# Patient Record
Sex: Male | Born: 1945 | Race: White | Hispanic: No | Marital: Married | Smoking: Never smoker
Health system: Southern US, Community
[De-identification: ages and names within clinical notes are randomized; demographics above are authoritative.]

## PROBLEM LIST (undated history)

## (undated) ENCOUNTER — Emergency Department (HOSPITAL_COMMUNITY): Payer: Medicare Other

## (undated) DIAGNOSIS — K219 Gastro-esophageal reflux disease without esophagitis: Secondary | ICD-10-CM

## (undated) DIAGNOSIS — Z8719 Personal history of other diseases of the digestive system: Secondary | ICD-10-CM

## (undated) DIAGNOSIS — C189 Malignant neoplasm of colon, unspecified: Secondary | ICD-10-CM

## (undated) DIAGNOSIS — Z803 Family history of malignant neoplasm of breast: Secondary | ICD-10-CM

## (undated) DIAGNOSIS — Z800 Family history of malignant neoplasm of digestive organs: Secondary | ICD-10-CM

## (undated) DIAGNOSIS — C801 Malignant (primary) neoplasm, unspecified: Secondary | ICD-10-CM

## (undated) DIAGNOSIS — I4891 Unspecified atrial fibrillation: Secondary | ICD-10-CM

## (undated) DIAGNOSIS — I10 Essential (primary) hypertension: Secondary | ICD-10-CM

## (undated) DIAGNOSIS — G20 Parkinson's disease: Secondary | ICD-10-CM

## (undated) DIAGNOSIS — Z8739 Personal history of other diseases of the musculoskeletal system and connective tissue: Secondary | ICD-10-CM

## (undated) DIAGNOSIS — G20A1 Parkinson's disease without dyskinesia, without mention of fluctuations: Secondary | ICD-10-CM

## (undated) DIAGNOSIS — I8390 Asymptomatic varicose veins of unspecified lower extremity: Secondary | ICD-10-CM

## (undated) DIAGNOSIS — G4730 Sleep apnea, unspecified: Secondary | ICD-10-CM

## (undated) HISTORY — DX: Family history of malignant neoplasm of breast: Z80.3

## (undated) HISTORY — DX: Family history of malignant neoplasm of digestive organs: Z80.0

## (undated) HISTORY — PX: VEIN LIGATION AND STRIPPING: SHX2653

## (undated) HISTORY — DX: Essential (primary) hypertension: I10

## (undated) HISTORY — DX: Sleep apnea, unspecified: G47.30

## (undated) HISTORY — DX: Asymptomatic varicose veins of unspecified lower extremity: I83.90

## (undated) HISTORY — DX: Malignant neoplasm of colon, unspecified: C18.9

---

## 2004-05-21 ENCOUNTER — Ambulatory Visit (HOSPITAL_COMMUNITY): Admission: RE | Admit: 2004-05-21 | Discharge: 2004-05-21 | Payer: Self-pay | Admitting: Pulmonary Disease

## 2005-10-01 ENCOUNTER — Ambulatory Visit (HOSPITAL_COMMUNITY): Admission: RE | Admit: 2005-10-01 | Discharge: 2005-10-01 | Payer: Self-pay | Admitting: Pulmonary Disease

## 2006-06-02 ENCOUNTER — Ambulatory Visit (HOSPITAL_COMMUNITY): Admission: RE | Admit: 2006-06-02 | Discharge: 2006-06-02 | Payer: Self-pay | Admitting: Pulmonary Disease

## 2011-05-18 ENCOUNTER — Emergency Department (HOSPITAL_COMMUNITY)
Admission: EM | Admit: 2011-05-18 | Discharge: 2011-05-18 | Disposition: A | Discharge status: Home / Self Care | Payer: PRIVATE HEALTH INSURANCE | Attending: Emergency Medicine | Admitting: Emergency Medicine

## 2011-05-18 ENCOUNTER — Emergency Department (HOSPITAL_COMMUNITY): Payer: PRIVATE HEALTH INSURANCE

## 2011-05-18 DIAGNOSIS — S8000XA Contusion of unspecified knee, initial encounter: Secondary | ICD-10-CM | POA: Insufficient documentation

## 2011-05-18 DIAGNOSIS — Y92009 Unspecified place in unspecified non-institutional (private) residence as the place of occurrence of the external cause: Secondary | ICD-10-CM | POA: Insufficient documentation

## 2011-05-18 DIAGNOSIS — I10 Essential (primary) hypertension: Secondary | ICD-10-CM | POA: Insufficient documentation

## 2011-05-18 DIAGNOSIS — W108XXA Fall (on) (from) other stairs and steps, initial encounter: Secondary | ICD-10-CM | POA: Insufficient documentation

## 2011-05-18 DIAGNOSIS — S5010XA Contusion of unspecified forearm, initial encounter: Secondary | ICD-10-CM | POA: Insufficient documentation

## 2013-05-22 ENCOUNTER — Emergency Department (HOSPITAL_COMMUNITY): Payer: Medicare Other

## 2013-05-22 ENCOUNTER — Encounter (HOSPITAL_COMMUNITY): Payer: Self-pay | Admitting: *Deleted

## 2013-05-22 ENCOUNTER — Emergency Department (HOSPITAL_COMMUNITY)
Admission: EM | Admit: 2013-05-22 | Discharge: 2013-05-22 | Disposition: A | Discharge status: Home / Self Care | Payer: Medicare Other | Attending: Emergency Medicine | Admitting: Emergency Medicine

## 2013-05-22 DIAGNOSIS — Y9389 Activity, other specified: Secondary | ICD-10-CM | POA: Insufficient documentation

## 2013-05-22 DIAGNOSIS — S0100XA Unspecified open wound of scalp, initial encounter: Secondary | ICD-10-CM | POA: Insufficient documentation

## 2013-05-22 DIAGNOSIS — S0001XA Abrasion of scalp, initial encounter: Secondary | ICD-10-CM

## 2013-05-22 DIAGNOSIS — S40212A Abrasion of left shoulder, initial encounter: Secondary | ICD-10-CM

## 2013-05-22 DIAGNOSIS — Z23 Encounter for immunization: Secondary | ICD-10-CM | POA: Insufficient documentation

## 2013-05-22 DIAGNOSIS — Y9289 Other specified places as the place of occurrence of the external cause: Secondary | ICD-10-CM | POA: Insufficient documentation

## 2013-05-22 DIAGNOSIS — S50812A Abrasion of left forearm, initial encounter: Secondary | ICD-10-CM

## 2013-05-22 DIAGNOSIS — S0101XA Laceration without foreign body of scalp, initial encounter: Secondary | ICD-10-CM

## 2013-05-22 DIAGNOSIS — W19XXXA Unspecified fall, initial encounter: Secondary | ICD-10-CM

## 2013-05-22 DIAGNOSIS — S0990XA Unspecified injury of head, initial encounter: Secondary | ICD-10-CM | POA: Insufficient documentation

## 2013-05-22 DIAGNOSIS — IMO0002 Reserved for concepts with insufficient information to code with codable children: Secondary | ICD-10-CM | POA: Insufficient documentation

## 2013-05-22 DIAGNOSIS — W108XXA Fall (on) (from) other stairs and steps, initial encounter: Secondary | ICD-10-CM | POA: Insufficient documentation

## 2013-05-22 MED ORDER — DOUBLE ANTIBIOTIC 500-10000 UNIT/GM EX OINT
TOPICAL_OINTMENT | Freq: Once | CUTANEOUS | Status: AC
  Administered 2013-05-22: 13:00:00 via TOPICAL
  Filled 2013-05-22: qty 1

## 2013-05-22 MED ORDER — TETANUS-DIPHTH-ACELL PERTUSSIS 5-2.5-18.5 LF-MCG/0.5 IM SUSP
0.5000 mL | Freq: Once | INTRAMUSCULAR | Status: AC
  Administered 2013-05-22: 0.5 mL via INTRAMUSCULAR
  Filled 2013-05-22: qty 0.5

## 2013-05-22 NOTE — ED Notes (Signed)
MD at bedside. 

## 2013-05-22 NOTE — ED Provider Notes (Signed)
History     CSN: [id number]  Arrival date & time 05/22/13  1053   First MD Initiated Contact with Patient 05/22/13 1117      Chief Complaint  Patient presents with  . Fall  . Head Laceration     HPI Pt was seen at 1125.  Per pt and family, c/o sudden onset and resolution of one episode of slip and fall down 1 or 2 outside steps this morning PTA.  Pt states he "just got caught up in my feet" and fell down.  Pt states he fell to his left side.  Pt c/o "soreness" to left head and forearm areas.  States he hit his head against the ground. Denies LOC, no AMS since fall, no syncope, no CP/SOB, no abd pain, no N/V/D, no focal motor weakness, no tingling/numbness in extremities.    History reviewed. No pertinent past medical history.  Past Surgical History  Procedure Laterality Date  . Vein ligation and stripping      left leg    History  Substance Use Topics  . Smoking status: Never Smoker   . Smokeless tobacco: Not on file  . Alcohol Use: No     Review of Systems ROS: Statement: All systems negative except as marked or noted in the HPI; Constitutional: Negative for fever and chills. ; ; Eyes: Negative for eye pain, redness and discharge. ; ; ENMT: Negative for ear pain, hoarseness, nasal congestion, sinus pressure and sore throat. ; ; Cardiovascular: Negative for chest pain, palpitations, diaphoresis, dyspnea and peripheral edema. ; ; Respiratory: Negative for cough, wheezing and stridor. ; ; Gastrointestinal: Negative for nausea, vomiting, diarrhea, abdominal pain, blood in stool, hematemesis, jaundice and rectal bleeding. ; ; Genitourinary: Negative for dysuria, flank pain and hematuria. ; ; Musculoskeletal: Negative for back pain and neck pain. Negative for swelling and deformity.; ; Skin: +abrasions, laceration. Negative for pruritus, rash, blisters, bruising and skin lesion.; ; Neuro: Negative for headache, lightheadedness and neck stiffness. Negative for weakness, altered level of  consciousness , altered mental status, extremity weakness, paresthesias, involuntary movement, seizure and syncope.      Allergies  Review of patient's allergies indicates no known allergies.  Home Medications  No current outpatient prescriptions on file.  BP 158/82  Pulse 86  Temp(Src) 97 F (36.1 C) (Oral)  Resp 20  Ht 5' 7.5" (1.715 m)  Wt 280 lb (127.007 kg)  BMI 43.18 kg/m2  SpO2 96%  Physical Exam 1130: Physical examination: Vital signs and O2 SAT: Reviewed; Constitutional: Well developed, Well nourished, Well hydrated, In no acute distress; Head and Face: Normocephalic, +2cm lac to left frontal-parietal scalp.; Eyes: EOMI, PERRL, No scleral icterus; ENMT: Mouth and pharynx normal, Left TM normal, Right TM normal, Mucous membranes moist; Neck: Supple, Trachea midline; Spine: No midline CS, TS, LS tenderness.; Cardiovascular: Regular rate and rhythm, No gallop; Respiratory: Breath sounds clear & equal bilaterally, No rales, rhonchi, wheezes, Normal respiratory effort/excursion; Chest: Nontender, No deformity, Movement normal, No crepitus, No abrasions or ecchymosis.; Abdomen: Soft, Nontender, Nondistended, Normal bowel sounds, No abrasions or ecchymosis.; Genitourinary: No CVA tenderness;; Extremities: +superficial abrasion to left dorsal forearm and left posterior shoulder.  No deformity, Full range of motion major/large joints of bilat UE's and LE's without pain or tenderness to palp, Neurovascularly intact, Pulses normal, No tenderness, No edema, Pelvis stable; Neuro: AA&Ox3, GCS 15.  Major CN grossly intact. Speech clear. No gross focal motor or sensory deficits in extremities.; Skin: Color normal, Warm, Dry  ED Course  Procedures   LACERATION REPAIR Performed by: Laray Anger Authorized by: Laray Anger Consent: Verbal consent obtained. Risks and benefits: risks, benefits and alternatives were discussed Consent given by: patient Patient identity confirmed:  provided demographic data Prepped and Draped in normal sterile fashion Wound explored Laceration Location: left frontal-parietal scalp Laceration Length: 2cm No Foreign Bodies seen or palpated Anesthesia: none Irrigation method: NS Amount of cleaning: standard Skin closure: staples Number of sutures: 3 Patient tolerance: Patient tolerated the procedure well with no immediate complications.   MDM  MDM Reviewed: previous chart, nursing note and vitals Interpretation: x-ray and CT scan   Dg Elbow Complete Left 05/22/2013   *RADIOLOGY REPORT*  Clinical Data: Larey Seat.  Injured elbow.  LEFT ELBOW - COMPLETE 3+ VIEW  Comparison: None  Findings: Moderate elbow joint degenerative changes.  An olecranon spur is noted.  No acute fracture or joint effusion.  IMPRESSION: Degenerative changes but no acute fracture or joint effusion.   Original Report Authenticated By: Rudie Meyer, M.D.   Ct Head Wo Contrast 05/22/2013   *RADIOLOGY REPORT*  Clinical Data:  Larey Seat earlier today sustaining laceration to the forehead.  Headache.  Neck pain.  CT HEAD WITHOUT CONTRAST CT CERVICAL SPINE WITHOUT CONTRAST  Technique:  Multidetector CT imaging of the head and cervical spine was performed following the standard protocol without intravenous contrast.  Multiplanar CT image reconstructions of the cervical spine were also generated.  Comparison:  None.  CT HEAD  Findings: Ventricular system normal in size appearance for age; slight asymmetry in the lateral ventricles, left greater than right, is felt to be developmental.  Mild age appropriate cortical atrophy.  No mass lesion.  No midline shift.  No acute hemorrhage or hematoma.  No extra-axial fluid collections.  No evidence of acute infarction.  No focal brain parenchymal abnormality.  Laceration involving the left frontal scalp without underlying skull fracture.  Mucosal thickening involving the sphenoid sinuses and opacification of multiple bilateral ethmoid air cells.  Bilateral mastoid air cells and middle ear cavities well-aerated.  IMPRESSION:  1.  No acute intracranial abnormality. 2.  Left frontal scalp laceration without underlying skull fracture. 3.  Chronic bilateral ethmoid and sphenoid sinusitis.  CT CERVICAL SPINE  Findings: No fractures identified involving the cervical spine. Ununited apophysis for the spinous process of C7.  Sagittal reconstructed images demonstrate straightening of the usual lordosis.  Disc space narrowing and endplate hypertrophic changes at C3-4, C4-5, and C5-6, with a chronic disc protrusion at C5-6 as evidenced by calcification in the posterior annular fibers.  Facet joints intact throughout with scattered degenerative changes. Coronal reformatted images demonstrate an intact craniocervical junction, intact C1-C2 articulation, intact dens, and intact lateral masses.  Combination of facet and uncinate hypertrophy account for multilevel foraminal stenoses including severe bilateral C3-4, moderate left C4-5, severe bilateral C5-6.  Mild to moderate spinal stenosis at C4-5 and C5-6.  IMPRESSION:  1.  No cervical spine fractures identified.  2.  Multilevel degenerative disc disease, spondylosis, and facet degenerative changes with multilevel foraminal stenoses as detailed above.  Mild to moderate spinal stenosis at C4-5 and C5-6.   Original Report Authenticated By: Hulan Saas, M.D.   Ct Cervical Spine Wo Contrast 05/22/2013   *RADIOLOGY REPORT*  Clinical Data:  Larey Seat earlier today sustaining laceration to the forehead.  Headache.  Neck pain.  CT HEAD WITHOUT CONTRAST CT CERVICAL SPINE WITHOUT CONTRAST  Technique:  Multidetector CT imaging of the head and cervical spine was performed following the standard  protocol without intravenous contrast.  Multiplanar CT image reconstructions of the cervical spine were also generated.  Comparison:  None.  CT HEAD  Findings: Ventricular system normal in size appearance for age; slight asymmetry in the  lateral ventricles, left greater than right, is felt to be developmental.  Mild age appropriate cortical atrophy.  No mass lesion.  No midline shift.  No acute hemorrhage or hematoma.  No extra-axial fluid collections.  No evidence of acute infarction.  No focal brain parenchymal abnormality.  Laceration involving the left frontal scalp without underlying skull fracture.  Mucosal thickening involving the sphenoid sinuses and opacification of multiple bilateral ethmoid air cells. Bilateral mastoid air cells and middle ear cavities well-aerated.  IMPRESSION:  1.  No acute intracranial abnormality. 2.  Left frontal scalp laceration without underlying skull fracture. 3.  Chronic bilateral ethmoid and sphenoid sinusitis.  CT CERVICAL SPINE  Findings: No fractures identified involving the cervical spine. Ununited apophysis for the spinous process of C7.  Sagittal reconstructed images demonstrate straightening of the usual lordosis.  Disc space narrowing and endplate hypertrophic changes at C3-4, C4-5, and C5-6, with a chronic disc protrusion at C5-6 as evidenced by calcification in the posterior annular fibers.  Facet joints intact throughout with scattered degenerative changes. Coronal reformatted images demonstrate an intact craniocervical junction, intact C1-C2 articulation, intact dens, and intact lateral masses.  Combination of facet and uncinate hypertrophy account for multilevel foraminal stenoses including severe bilateral C3-4, moderate left C4-5, severe bilateral C5-6.  Mild to moderate spinal stenosis at C4-5 and C5-6.  IMPRESSION:  1.  No cervical spine fractures identified.  2.  Multilevel degenerative disc disease, spondylosis, and facet degenerative changes with multilevel foraminal stenoses as detailed above.  Mild to moderate spinal stenosis at C4-5 and C5-6.   Original Report Authenticated By: Hulan Saas, M.D.   Dg Shoulder Left 05/22/2013   *RADIOLOGY REPORT*  Clinical Data: Larey Seat.  Injured left  shoulder.  LEFT SHOULDER - 2+ VIEW  Comparison: None  Findings: The joint spaces are maintained.  Mild degenerative changes but no acute bony findings.  The left lung is clear. Remote healed rib fractures are noted.  IMPRESSION: No fracture or dislocation.   Original Report Authenticated By: Rudie Meyer, M.D.    1330:  Lac closed with staples.  Pt wants to go home now. VSS, neuro exam unchanged, resps easy. Dx and testing d/w pt and family.  Questions answered.  Verb understanding, agreeable to d/c home with outpt f/u.          Laray Anger, DO 05/25/13 1223

## 2013-05-22 NOTE — ED Notes (Addendum)
Pt states that he was coming down steps when he thinks that his feet may have gotten tangled causing him to fall, pt states that he fell down ?2 steps, denies any LOC, has laceration to right side of head, abrasion to left forearm area. "sore" to bilateral knees, headache. Thinks that he may have hit his head on concrete.

## 2013-05-22 NOTE — ED Notes (Signed)
Lac noted to left scalp and abrasion noted to left forearm since fall this morning, pt c/o left shoulder pain since arrival-noted grass stains to posterior left shoulder

## 2013-05-31 ENCOUNTER — Other Ambulatory Visit: Payer: Self-pay | Admitting: *Deleted

## 2013-05-31 DIAGNOSIS — I83893 Varicose veins of bilateral lower extremities with other complications: Secondary | ICD-10-CM

## 2013-06-21 ENCOUNTER — Encounter: Payer: Medicare Other | Admitting: Vascular Surgery

## 2013-07-19 ENCOUNTER — Encounter: Payer: Self-pay | Admitting: Vascular Surgery

## 2013-07-20 ENCOUNTER — Encounter: Payer: Self-pay | Admitting: Vascular Surgery

## 2013-07-20 ENCOUNTER — Ambulatory Visit (INDEPENDENT_AMBULATORY_CARE_PROVIDER_SITE_OTHER): Payer: Medicare Other | Admitting: Vascular Surgery

## 2013-07-20 ENCOUNTER — Encounter (INDEPENDENT_AMBULATORY_CARE_PROVIDER_SITE_OTHER): Payer: Medicare Other | Admitting: Vascular Surgery

## 2013-07-20 VITALS — BP 176/95 | HR 74 | Resp 18 | Ht 68.0 in | Wt 312.0 lb

## 2013-07-20 DIAGNOSIS — I83893 Varicose veins of bilateral lower extremities with other complications: Secondary | ICD-10-CM | POA: Insufficient documentation

## 2013-07-20 NOTE — Progress Notes (Signed)
Subjective:     [name], male   DOB: [birth year], 67 y.o.   MRN: [id number]  HPI this 67 year old male had vein stripping of the left leg performed by Dr. Marshia Ly in [place] about 20 years ago. Over the past several years she has been having significant swelling and bulging veins appear in the leg both in the thigh and calf. 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 He has a history of stasis ulcers or bleeding. His biggest concern is the swelling in the bulging varicosities. He is not wear elastic compression stockings because he has very large legs. Right leg is free of edema.  History reviewed. No pertinent past medical history.  History  Substance Use Topics  . Smoking status: Never Smoker   . Smokeless tobacco: Not on file  . Alcohol Use: Not on file    Family History  Problem Relation Age of Onset  . Cancer Mother   . Cancer Father   . Diabetes Father   . Heart disease Father   . Hyperlipidemia Father   . Cancer Sister     No Known Allergies  No current outpatient prescriptions on file.  BP 176/95  Pulse 74  Resp 18  Ht 5\' 8"  (1.727 m)  Wt 312 lb (141.522 kg)  BMI 47.45 kg/m2  Body mass index is 47.45 kg/(m^2).          Review of Systems complains of pain in legs with walking-unable to walk one block, knee pain. Denies chest pain, dyspnea on exertion, PND, orthopnea, hemoptysis. All of the systems are negative complete review of systems    Objective:   Physical Exam blood pressure 176/95 heart rate 74 respirations 18 Gen.-alert and oriented x3 in no apparent distress HEENT normal for age Lungs no rhonchi or wheezing Cardiovascular regular rhythm no murmurs carotid pulses 3+ palpable no bruits audible Abdomen soft nontender no palpable masses Musculoskeletal free of  major deformities Skin clear -no rashes Neurologic normal Lower extremities 3+ femoral and dorsalis pedis pulses palpable bilaterally with  no edema on the right Left leg with 1-2+ edema below the knee. There bulging varicosities in the mid and anterior thigh on the left extending down to the medial calf. There is some early hyperpigmentation lower third left leg with no active ulcers.  Today I ordered venous duplex exam the left leg which are reviewed and interpreted. There is residual great saphenous vein which is not been removed which is tortuous with gross reflux and he is a large vein down to the distal thigh. There is area of some thrombosed varicosities distally. There is also reflux in the deep venous system. Small saphenous vein has no reflux.       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

## 2013-10-25 ENCOUNTER — Encounter: Payer: Self-pay | Admitting: Vascular Surgery

## 2013-10-26 ENCOUNTER — Encounter: Payer: Self-pay | Admitting: Vascular Surgery

## 2013-10-26 ENCOUNTER — Ambulatory Visit (INDEPENDENT_AMBULATORY_CARE_PROVIDER_SITE_OTHER): Payer: Medicare Other | Admitting: Vascular Surgery

## 2013-10-26 VITALS — BP 158/89 | HR 82 | Resp 16 | Ht 67.5 in | Wt 321.0 lb

## 2013-10-26 DIAGNOSIS — I83893 Varicose veins of bilateral lower extremities with other complications: Secondary | ICD-10-CM

## 2013-10-26 NOTE — Progress Notes (Signed)
Subjective:     [name], male   DOB: 1946-08-10, 67 y.o.   MRN: [id number]  HPI this 67 year old male returns for continued followup regarding his severe recurrent varicose veins in the left leg with severe venous hypertension skin changes distally. Patient had vein stripping performed in [place] 20 years ago and has been developing recurrent varicosities over the last several years. He has tried long-leg elastic compression stockings-20-30 mm gradient, as well as elevation and ibuprofen with no success. He continues to have pain and swelling.  History reviewed. No pertinent past medical history.  History  Substance Use Topics  . Smoking status: Never Smoker   . Smokeless tobacco: Not on file  . Alcohol Use: Not on file    Family History  Problem Relation Age of Onset  . Cancer Mother   . Cancer Father   . Diabetes Father   . Heart disease Father   . Hyperlipidemia Father   . Cancer Sister     No Known Allergies  No current outpatient prescriptions on file.  BP 158/89  Pulse 82  Resp 16  Ht 5' 7.5" (1.715 m)  Wt 321 lb (145.605 kg)  BMI 49.5 kg/m2  Body mass index is 49.5 kg/(m^2).           Review of Systems denies chest pain, dyspnea on exertion, PND, orthopnea, hemoptysis    Objective:   Physical Exam BP 158/89  Pulse 82  Resp 16  Ht 5' 7.5" (1.715 m)  Wt 321 lb (145.605 kg)  BMI 49.5 kg/m2  General obese male in no apparent stress alert and oriented x3 Left leg with bulging varicosities in the anterior thigh and medial thigh and calf below the knee with hyperpigmentation lower third of leg but no active ulcer. 1-2+ chronic edema. 3 posterior cells pedis pulse palpable.  Patient had reflux study performed last visit and has gross reflux in the anterior accessory branch left great saphenous vein supplying these bulging varicosities. This appears to be the main culprit supplying these recurrent varicosities.     Assessment:      Severe recurrent varicose veins with pain and distal edema and skin changes due to reflux in anterior accessory branch left great saphenous vein following remote history vein stripping left leg    Plan:     Patient needs laser ablation anterior accessory branch left great saphenous vein and in 3 months later possibly stab phlebectomy of secondary varicosities Will proceed to perform this in the near future to attempt to stabilize his skin changes and relieve some of his symptoms

## 2013-11-10 ENCOUNTER — Other Ambulatory Visit: Payer: Self-pay | Admitting: *Deleted

## 2013-11-10 DIAGNOSIS — I83893 Varicose veins of bilateral lower extremities with other complications: Secondary | ICD-10-CM

## 2013-12-03 ENCOUNTER — Encounter: Payer: Self-pay | Admitting: Vascular Surgery

## 2013-12-06 ENCOUNTER — Encounter (INDEPENDENT_AMBULATORY_CARE_PROVIDER_SITE_OTHER): Payer: Self-pay

## 2013-12-06 ENCOUNTER — Encounter: Payer: Self-pay | Admitting: Vascular Surgery

## 2013-12-06 ENCOUNTER — Ambulatory Visit (INDEPENDENT_AMBULATORY_CARE_PROVIDER_SITE_OTHER): Payer: Medicare Other | Admitting: Vascular Surgery

## 2013-12-06 VITALS — BP 151/87 | HR 79 | Resp 24 | Ht 67.0 in | Wt 303.0 lb

## 2013-12-06 DIAGNOSIS — I83893 Varicose veins of bilateral lower extremities with other complications: Secondary | ICD-10-CM

## 2013-12-06 NOTE — Progress Notes (Signed)
Subjective:     [name], male   DOB: [birth year], 67 y.o.   MRN: [id number]  HPI this 67 year old male laser ablation of anterior accessory branch of the left great saphenous vein performed under local tumescent anesthesia. He tolerated the procedure well. A total of 958 J of energy was utilized from the mid thigh to near the saphenofemoral junction.   Review of Systems     Objective:   Physical Exam BP 151/87  Pulse 79  Resp 24  Ht 5\' 7"  (1.702 m)  Wt 303 lb (137.44 kg)  BMI 47.45 kg/m2        Assessment:     Well-tolerated laser ablation anterior accessory branch left great saphenous vein performed under local tumescent anesthesia    Plan:     Return in one week for venous duplex exam to confirm closure left anterior accessory branch great saphenous vein. Great saphenous vein has been previously stripped. There are multiple collaterals in the area of the saphenofemoral junction. Patient will then return in 3 months to see if stab phlebectomy will be indicated.

## 2013-12-06 NOTE — Progress Notes (Signed)
   Laser Ablation Procedure      Date: 12/06/2013    [name] DOB:02/17/1946  Consent signed: Yes  Surgeon:J.D. Hart [place]  Procedure: Laser Ablation: left Greater ant accessory branch Saphenous Vein  BP 151/87  Pulse 79  Resp 24  Ht 5\' 7"  (1.702 m)  Wt 303 lb (137.44 kg)  BMI 47.45 kg/m2  Start time: 9:10   End time: 9:55  Tumescent Anesthesia: 300 cc 0.9% NaCl with 50 cc Lidocaine HCL with 1% Epi and 15 cc 8.4% NaHCO3  Local Anesthesia: 3 cc Lidocaine HCL and NaHCO3 (ratio 2:1)  Pulsed mode: 15 watts, 500 ms delay, 1.0 duration Total energy: 958, total pulses: 64, total time: 1:04     Patient tolerated procedure well: Yes  Notes:   Description of Procedure:  After marking the course of the saphenous vein and the secondary varicosities in the standing position, the patient was placed on the operating table in the supine position, and the left leg was prepped and draped in sterile fashion. Local anesthetic was administered, and under ultrasound guidance the saphenous vein was accessed with a micro needle and guide wire; then the micro puncture sheath was placed. A guide wire was inserted to the saphenofemoral junction, followed by a 5 french sheath.  The position of the sheath and then the laser fiber below the junction was confirmed using the ultrasound and visualization of the aiming beam.  Tumescent anesthesia was administered along the course of the saphenous vein using ultrasound guidance. Protective laser glasses were placed on the patient, and the laser was fired at 15 watt pulsed mode advancing 1-2 mm per sec.  For a total of 958 joules.  A steri strip was applied to the puncture site.    ABD pads and thigh high compression stockings were applied.  Ace wrap bandages were applied over the phlebectomy sites and at the top of the saphenofemoral junction.  Blood loss was less than 15 cc.  The patient ambulated out of the operating room having tolerated the procedure  well.

## 2013-12-07 ENCOUNTER — Telehealth: Payer: Self-pay | Admitting: *Deleted

## 2013-12-07 ENCOUNTER — Encounter: Payer: Self-pay | Admitting: Vascular Surgery

## 2013-12-07 NOTE — Telephone Encounter (Signed)
Left a message for the patient to call me if he is having any problems or if he has questions. Reminded him of his fu appt next week.

## 2013-12-10 ENCOUNTER — Encounter: Payer: Self-pay | Admitting: Vascular Surgery

## 2013-12-13 ENCOUNTER — Ambulatory Visit (HOSPITAL_COMMUNITY)
Admission: RE | Admit: 2013-12-13 | Discharge: 2013-12-13 | Disposition: A | Discharge status: Home / Self Care | Payer: Medicare Other | Source: Ambulatory Visit | Attending: Vascular Surgery | Admitting: Vascular Surgery

## 2013-12-13 ENCOUNTER — Ambulatory Visit (INDEPENDENT_AMBULATORY_CARE_PROVIDER_SITE_OTHER): Payer: Medicare Other | Admitting: Vascular Surgery

## 2013-12-13 ENCOUNTER — Encounter: Payer: Self-pay | Admitting: Vascular Surgery

## 2013-12-13 VITALS — BP 129/79 | HR 66 | Resp 18 | Ht 68.0 in | Wt 303.0 lb

## 2013-12-13 DIAGNOSIS — I83893 Varicose veins of bilateral lower extremities with other complications: Secondary | ICD-10-CM

## 2013-12-13 NOTE — Progress Notes (Signed)
Subjective:     [name], male   DOB: 18-Mar-1946, 67 y.o.   MRN: [id number]  HPI this 67 year old male returns 1 week post laser ablation of a large anterior accessory branch of the left great saphenous vein which was supplying multiple bulging varicosities. He also had an episode of thrombophlebitis involving some varicosities in this distribution. He has done well since his procedure. He states that the swelling in his ankle has diminished over the past week. He has had mild amount of discomfort up in the inguinal area where the laser procedure was performed.  Past Medical History  Diagnosis Date  . Varicose veins   . Hypertension   . Hyperlipidemia   . Diabetes mellitus without complication     History  Substance Use Topics  . Smoking status: Never Smoker   . Smokeless tobacco: Never Used  . Alcohol Use: No    Family History  Problem Relation Age of Onset  . Cancer Mother   . Cancer Father   . Diabetes Father   . Heart disease Father   . Hyperlipidemia Father   . Cancer Sister     No Known Allergies  No current outpatient prescriptions on file.  BP 129/79  Pulse 66  Resp 18  Ht 5\' 8"  (1.727 m)  Wt 303 lb (137.44 kg)  BMI 46.08 kg/m2  Body mass index is 46.08 kg/(m^2).          Review of Systems denies chest pain, dyspnea on exertion, PND, orthopnea, hemoptysis     Objective:   Physical Exam BP 129/79  Pulse 66  Resp 18  Ht 5\' 8"  (1.727 m)  Wt 303 lb (137.44 kg)  BMI 46.08 kg/m2  General well-developed well-nourished male no apparent distress alert and oriented x3 Lungs no rhonchi or wheezing Left leg with mild discomfort along the course of proximal great saphenous vein-accessory branch. 2+ dorsalis pedis pulse palpable.  Today I ordered a venous duplex exam in the left leg chart reviewed and interpreted. There is no DVT. Left great saphenous vein anterior accessory branch is totally closed.     Assessment:     Successful  laser ablation anterior accessory branch left great saphenous vein for gross reflux with bulging painful varicosities    Plan:     Return in 3 months to determine if stab phlebectomy of residual varicosities will be necessary

## 2014-03-14 ENCOUNTER — Encounter: Payer: Self-pay | Admitting: Vascular Surgery

## 2014-03-15 ENCOUNTER — Encounter: Payer: Self-pay | Admitting: Vascular Surgery

## 2014-03-15 ENCOUNTER — Ambulatory Visit (INDEPENDENT_AMBULATORY_CARE_PROVIDER_SITE_OTHER): Payer: Medicare Other | Admitting: Vascular Surgery

## 2014-03-15 VITALS — BP 158/87 | HR 88 | Resp 18 | Ht 67.0 in | Wt 314.0 lb

## 2014-03-15 DIAGNOSIS — I83893 Varicose veins of bilateral lower extremities with other complications: Secondary | ICD-10-CM

## 2014-03-15 NOTE — Progress Notes (Signed)
Subjective:     [name], male   DOB: 1946-03-29, 68 y.o.   MRN: [id number]  HPI this 68 year old male returns 3 months post laser ablation left great saphenous vein for continued followup. He is having less edema than preoperatively. He continues to have aching and throbbing discomfort and bulging varicosities in his thigh and calf area. His symptoms are not improved relieved with elastic compression stockings.  Past Medical History  Diagnosis Date  . Varicose veins   . Hypertension   . Hyperlipidemia   . Diabetes mellitus without complication     History  Substance Use Topics  . Smoking status: Never Smoker   . Smokeless tobacco: Never Used  . Alcohol Use: No    Family History  Problem Relation Age of Onset  . Cancer Mother   . Cancer Father   . Diabetes Father   . Heart disease Father   . Hyperlipidemia Father   . Cancer Sister     No Known Allergies  No current outpatient prescriptions on file.  BP 158/87  Pulse 88  Resp 18  Ht 5\' 7"  (1.702 m)  Wt 314 lb (142.429 kg)  BMI 49.17 kg/m2  Body mass index is 49.17 kg/(m^2).          Review of Systems denies chest pain but does have mild dyspnea on exertion. He has severe right knee pain and needs knee replacement.     Objective:   Physical Exam BP 158/87  Pulse 88  Resp 18  Ht 5\' 7"  (1.702 m)  Wt 314 lb (142.429 kg)  BMI 49.17 kg/m2  General obese male in no apparent distress alert and oriented x3 Bulging varicosities in left anterior and medial thigh and medial calf which are still quite significant although less distal edema than prior to ablation      Assessment:     Status post laser ablation left great saphenous vein with residual painful varicosities    Plan:     Patient needs stab phlebectomy of secondary painful varicosities-greater than 20. We'll schedule this in the near future

## 2014-03-31 ENCOUNTER — Encounter: Payer: Self-pay | Admitting: Vascular Surgery

## 2014-04-04 ENCOUNTER — Encounter: Payer: Self-pay | Admitting: Vascular Surgery

## 2014-04-04 ENCOUNTER — Ambulatory Visit (INDEPENDENT_AMBULATORY_CARE_PROVIDER_SITE_OTHER): Payer: Medicare Other | Admitting: Vascular Surgery

## 2014-04-04 VITALS — BP 152/93 | HR 80 | Resp 18 | Ht 67.5 in | Wt 314.0 lb

## 2014-04-04 DIAGNOSIS — I83893 Varicose veins of bilateral lower extremities with other complications: Secondary | ICD-10-CM

## 2014-04-04 NOTE — Progress Notes (Signed)
Subjective:     [name], male   DOB: [birth year], 68 y.o.   MRN: [id number]  HPI this 68 year old male had multiple stab phlebectomy left leg secondary painful varicosities performed under local tumescent anesthesia. Greater than 20 stab tubectomy's were performed. He tolerated the procedure well. 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   Review of Systems     Objective:   Physical Exam BP 152/93  Pulse 80  Resp 18  Ht 5' 7.5" (1.715 m)  Wt 314 lb (142.429 kg)  BMI 48.43 kg/m2        Assessment:     Multiple stab phlebectomy painful varicosities left leg under local tumescent anesthesia-well-tolerated    Plan:     Return in 6 weeks for follow followup

## 2014-04-04 NOTE — Progress Notes (Signed)
   Laser Ablation Procedure      Date: 04/04/2014    [name] DOB:[birth year]  Consent signed: Yes  Surgeon:J.D. Kellie Simmering  Procedure:Stab Phlebectomiesleft leg  BP 152/93  Pulse 80  Resp 18  Ht 5' 7.5" (1.715 m)  Wt 314 lb (142.429 kg)  BMI 48.43 kg/m2  Start time: 9:10   End time: 10:10  Tumescent Anesthesia: 200 cc 0.9% NaCl with 50 cc Lidocaine HCL with 1% Epi and 15 cc 8.4% NaHCO3  Local Anesthesia: 6 cc Lidocaine HCL and NaHCO3 (ratio 2:1)       Stab Phlebectomy: >20 Sites: Thigh and Calf  Patient tolerated procedure well: Yes  Notes:   Description of Procedure:  The patient was placed on the operating table in the supine position, and the left leg was prepped and draped in sterile fashion. Local anesthetic was administered..  The patient was then put into Trendelenburg position.  Local anesthetic was utilized overlying the marked varicosities.  Greater than 20 stab wounds were made using the tip of an 11 blade; and using the vein hook,  The phlebectomies were performed using a hemostat to avulse these varicosities.  Adequate hemostasis was achieved, and steri strips were applied to the stab wound.   ABD pads and thigh high compression stockings were applied.  Ace wrap bandages were applied over the phlebectomy sites.  Blood loss was less than 15 cc.  The patient ambulated out of the operating room having tolerated the procedure well.

## 2014-04-06 ENCOUNTER — Telehealth: Payer: Self-pay

## 2014-04-06 NOTE — Telephone Encounter (Signed)
Phone call from wife.  Reported initial bandages were removed from left leg.  Stated that the incision @ inner aspect of left knee was actively oozing bloody fluid, when she removed the bandages.  Stated the "little piece of tape" was still intact.  Instructed pt's. wife to place a piece of gauze over the incision that is oozing, and apply gentle, steady pressure from 2-3 fingers, over site, x 5 minutes; advised that pt. should be in a reclined position, with left leg elevated, during this time.  Encouraged to reapply the compression stockings, and to continue to wear the compression, as instructed, post-procedure.  Advised pt's wife to moisten the gauze, first, over the affected area, the next time he showers/ changes his dressings. Verb. understanding.  Encouraged to call office with any other concerns.  Agrees.

## 2014-05-16 ENCOUNTER — Encounter: Payer: Self-pay | Admitting: Vascular Surgery

## 2014-05-17 ENCOUNTER — Encounter: Payer: Self-pay | Admitting: Vascular Surgery

## 2014-05-17 ENCOUNTER — Ambulatory Visit (INDEPENDENT_AMBULATORY_CARE_PROVIDER_SITE_OTHER): Payer: Self-pay | Admitting: Vascular Surgery

## 2014-05-17 VITALS — BP 161/98 | HR 75 | Resp 18 | Ht 67.5 in | Wt 315.0 lb

## 2014-05-17 DIAGNOSIS — I83893 Varicose veins of bilateral lower extremities with other complications: Secondary | ICD-10-CM

## 2014-05-17 NOTE — Progress Notes (Signed)
Subjective:     [name], male   DOB: 1946-06-01, 68 y.o.   MRN: [id number]  HPI 68 year old male is seen today in followup regarding his multiple stab phlebectomy of the left leg performed 6 weeks ago. He denies any pain at the phlebectomy sites. He has had previous laser ablation of the anterior accessory branch of the left great saphenous vein. He continues to have mild edema. He is not wearing elastic compression stockings.   Review of Systems     Objective:   Physical Exam BP 161/98  Pulse 75  Resp 18  Ht 5' 7.5" (1.715 m)  Wt 315 lb (142.883 kg)  BMI 48.58 kg/m2  General alert and oriented x3 in no apparent stress Left leg with nicely healed stab phlebectomy sites no evidence of infection. No residual bulging varicosities noted. 1+ distal edema with 2+ dorsalis pedis pulse palpable.     Assessment:     Status post stab phlebectomy multiple varicosities left anterior thigh with residual mild edema distally    Plan:     #1 short-leg elastic compression stockings 20-30 mm gradient #2 return to see [place] on when necessary basis #3 patient complaining of right knee pain due to arthritis Will refer to Dr. Meridee Score to evaluate right knee pain

## 2014-05-18 ENCOUNTER — Telehealth: Payer: Self-pay | Admitting: Vascular Surgery

## 2014-05-18 NOTE — Telephone Encounter (Signed)
Dr. Kellie Simmering wanted the patient to see Dr. Sharol Given for his (R) knee arthritis.  Patient did not want me to make an appointment said he would make an appointment his self.

## 2015-01-02 ENCOUNTER — Encounter (HOSPITAL_COMMUNITY): Payer: Self-pay | Admitting: Emergency Medicine

## 2015-01-02 ENCOUNTER — Inpatient Hospital Stay (HOSPITAL_COMMUNITY): Payer: Medicare Other

## 2015-01-02 ENCOUNTER — Emergency Department (HOSPITAL_COMMUNITY): Payer: Medicare Other

## 2015-01-02 ENCOUNTER — Inpatient Hospital Stay (HOSPITAL_COMMUNITY)
Admission: EM | Admit: 2015-01-02 | Discharge: 2015-01-04 | DRG: 309 | Disposition: A | Discharge status: Home / Self Care | Payer: Medicare Other | Attending: Pulmonary Disease | Admitting: Pulmonary Disease

## 2015-01-02 DIAGNOSIS — E785 Hyperlipidemia, unspecified: Secondary | ICD-10-CM | POA: Diagnosis present

## 2015-01-02 DIAGNOSIS — D696 Thrombocytopenia, unspecified: Secondary | ICD-10-CM | POA: Diagnosis present

## 2015-01-02 DIAGNOSIS — Z6841 Body Mass Index (BMI) 40.0 and over, adult: Secondary | ICD-10-CM

## 2015-01-02 DIAGNOSIS — I4891 Unspecified atrial fibrillation: Principal | ICD-10-CM

## 2015-01-02 DIAGNOSIS — R079 Chest pain, unspecified: Secondary | ICD-10-CM | POA: Diagnosis present

## 2015-01-02 DIAGNOSIS — G4733 Obstructive sleep apnea (adult) (pediatric): Secondary | ICD-10-CM | POA: Diagnosis present

## 2015-01-02 DIAGNOSIS — I2510 Atherosclerotic heart disease of native coronary artery without angina pectoris: Secondary | ICD-10-CM | POA: Diagnosis present

## 2015-01-02 DIAGNOSIS — I2699 Other pulmonary embolism without acute cor pulmonale: Secondary | ICD-10-CM

## 2015-01-02 DIAGNOSIS — I10 Essential (primary) hypertension: Secondary | ICD-10-CM | POA: Diagnosis present

## 2015-01-02 HISTORY — DX: Unspecified atrial fibrillation: I48.91

## 2015-01-02 HISTORY — DX: Morbid (severe) obesity due to excess calories: E66.01

## 2015-01-02 LAB — COMPREHENSIVE METABOLIC PANEL
ALT: 14 U/L (ref 0–53)
AST: 19 U/L (ref 0–37)
Albumin: 3.6 g/dL (ref 3.5–5.2)
Alkaline Phosphatase: 86 U/L (ref 39–117)
Anion gap: 4 — ABNORMAL LOW (ref 5–15)
BUN: 16 mg/dL (ref 6–23)
CO2: 32 mmol/L (ref 19–32)
Calcium: 9.2 mg/dL (ref 8.4–10.5)
Chloride: 103 mEq/L (ref 96–112)
Creatinine, Ser: 0.91 mg/dL (ref 0.50–1.35)
GFR calc Af Amer: >90 mL/min (ref >90)
GFR calc non Af Amer: 85 mL/min — ABNORMAL LOW (ref >90)
Glucose, Bld: 102 mg/dL — ABNORMAL HIGH (ref 70–99)
Potassium: 4.2 mmol/L (ref 3.5–5.1)
Sodium: 139 mmol/L (ref 135–145)
Total Bilirubin: 1.2 mg/dL (ref 0.3–1.2)
Total Protein: 7.3 g/dL (ref 6.0–8.3)

## 2015-01-02 LAB — CBC WITH DIFFERENTIAL/PLATELET
Basophils Absolute: 0 10*3/uL (ref 0.0–0.1)
Basophils Relative: 0 % (ref 0–1)
Eosinophils Absolute: 0.4 10*3/uL (ref 0.0–0.7)
Eosinophils Relative: 5 % (ref 0–5)
HCT: 46.4 % (ref 39.0–52.0)
Hemoglobin: 15.7 g/dL (ref 13.0–17.0)
Lymphocytes Relative: 26 % (ref 12–46)
Lymphs Abs: 2.2 10*3/uL (ref 0.7–4.0)
MCH: 32.7 pg (ref 26.0–34.0)
MCHC: 33.8 g/dL (ref 30.0–36.0)
MCV: 96.7 fL (ref 78.0–100.0)
Monocytes Absolute: 1 10*3/uL (ref 0.1–1.0)
Monocytes Relative: 12 % (ref 3–12)
Neutro Abs: 4.6 10*3/uL (ref 1.7–7.7)
Neutrophils Relative %: 57 % (ref 43–77)
Platelets: 143 10*3/uL — ABNORMAL LOW (ref 150–400)
RBC: 4.8 MIL/uL (ref 4.22–5.81)
RDW: 12.9 % (ref 11.5–15.5)
WBC: 8.2 10*3/uL (ref 4.0–10.5)

## 2015-01-02 LAB — CBG MONITORING, ED: Glucose-Capillary: 89 mg/dL (ref 70–99)

## 2015-01-02 LAB — TSH: TSH: 3.404 u[IU]/mL (ref 0.350–4.500)

## 2015-01-02 LAB — D-DIMER, QUANTITATIVE (NOT AT ARMC): D DIMER QUANT: 0.95 ug{FEU}/mL — AB (ref 0.00–0.48)

## 2015-01-02 LAB — TROPONIN I: Troponin I: <0.03 ng/mL (ref <0.031)

## 2015-01-02 MED ORDER — TRAMADOL HCL 50 MG PO TABS
50.0000 mg | ORAL_TABLET | Freq: Four times a day (QID) | ORAL | Status: AC | PRN
  Administered 2015-01-03 (×2): 50 mg via ORAL
  Filled 2015-01-02 (×2): qty 1

## 2015-01-02 MED ORDER — SODIUM CHLORIDE 0.9 % IJ SOLN
3.0000 mL | Freq: Two times a day (BID) | INTRAMUSCULAR | Status: DC
Start: 2015-01-02 — End: 2015-01-04
  Administered 2015-01-02 – 2015-01-04 (×3): 3 mL via INTRAVENOUS

## 2015-01-02 MED ORDER — NITROGLYCERIN 2 % TD OINT
1.0000 [in_us] | TOPICAL_OINTMENT | Freq: Once | TRANSDERMAL | Status: AC
  Administered 2015-01-02: 1 [in_us] via TOPICAL
  Filled 2015-01-02: qty 1

## 2015-01-02 MED ORDER — INFLUENZA VAC SPLIT QUAD 0.5 ML IM SUSY
0.5000 mL | PREFILLED_SYRINGE | INTRAMUSCULAR | Status: AC
  Administered 2015-01-03: 0.5 mL via INTRAMUSCULAR
  Filled 2015-01-02: qty 0.5

## 2015-01-02 MED ORDER — PNEUMOCOCCAL VAC POLYVALENT 25 MCG/0.5ML IJ INJ
0.5000 mL | INJECTION | INTRAMUSCULAR | Status: AC
  Administered 2015-01-03: 0.5 mL via INTRAMUSCULAR
  Filled 2015-01-02: qty 0.5

## 2015-01-02 MED ORDER — ENOXAPARIN SODIUM 40 MG/0.4ML ~~LOC~~ SOLN
40.0000 mg | SUBCUTANEOUS | Status: DC
  Administered 2015-01-02 – 2015-01-03 (×2): 40 mg via SUBCUTANEOUS
  Filled 2015-01-02 (×2): qty 0.4

## 2015-01-02 MED ORDER — MORPHINE SULFATE 2 MG/ML IJ SOLN
2.0000 mg | Freq: Once | INTRAMUSCULAR | Status: AC
  Administered 2015-01-02: 2 mg via INTRAVENOUS
  Filled 2015-01-02: qty 1

## 2015-01-02 MED ORDER — ACETAMINOPHEN 325 MG PO TABS
650.0000 mg | ORAL_TABLET | Freq: Four times a day (QID) | ORAL | Status: DC | PRN
  Administered 2015-01-03 – 2015-01-04 (×2): 650 mg via ORAL
  Filled 2015-01-02 (×2): qty 2

## 2015-01-02 MED ORDER — IOHEXOL 350 MG/ML SOLN
100.0000 mL | Freq: Once | INTRAVENOUS | Status: AC | PRN
  Administered 2015-01-02: 100 mL via INTRAVENOUS

## 2015-01-02 MED ORDER — ONDANSETRON HCL 4 MG PO TABS: 4.0000 mg | ORAL_TABLET | Freq: Four times a day (QID) | ORAL | Status: DC | PRN

## 2015-01-02 MED ORDER — SODIUM CHLORIDE 0.9 % IJ SOLN
3.0000 mL | Freq: Two times a day (BID) | INTRAMUSCULAR | Status: DC
  Administered 2015-01-03: 3 mL via INTRAVENOUS

## 2015-01-02 MED ORDER — ONDANSETRON HCL 4 MG/2ML IJ SOLN: 4.0000 mg | Freq: Four times a day (QID) | INTRAMUSCULAR | Status: DC | PRN

## 2015-01-02 NOTE — H&P (Signed)
[facility] History and Physical  [name]:[id number] DOB: 01/11/1946 DOA: 01/02/2015  Referring physician: ER PCP: [name], MD   Chief Complaint: Chest pain  HPI: [name] is a 69 y.o. male  This is a 69 year old man who has not seen a physician for over 2 years and now presents with right-sided chest pain which began yesterday afternoon approximately 3 PM at rest. It has lasted on and off most of yesterday and today. It has not been associated with dyspnea, nausea, sweating. It is not pleuritic in nature. There is no fever but he does describe a mild cough which is nonproductive. He denies any heavy lifting or any trauma. He is on no medications. He does have a family history of coronary artery disease. He does not have a history of diabetes or hypertension. Evaluation in the emergency room found him to be in atrial fibrillation, new onset, ventricular rate control. He is now being admitted for further management.   Review of Systems:  Apart from symptoms above, all systems negative.  Past Medical History  Diagnosis Date  . Varicose veins   . Hypertension   . Hyperlipidemia   . New onset atrial fibrillation 01/02/2015  . Morbid obesity 01/02/2015   Past Surgical History  Procedure Laterality Date  . Vein ligation and stripping      left leg  . Pr vein bypass graft,aorto-fem-pop    . Abdominal aortic aneurysm repair     Social History:  reports that he has never smoked. He has never used smokeless tobacco. He reports that he does not drink alcohol or use illicit drugs.  No Known Allergies  Family History  Problem Relation Age of Onset  . Cancer Mother   . Cancer Father   . Diabetes Father   . Heart disease Father   . Hyperlipidemia Father   . Cancer Sister      Prior to Admission medications   Medication Sig Start Date End Date Taking? Authorizing Provider  ibuprofen (ADVIL,MOTRIN) 200 MG tablet Take 400 mg by mouth every 6 (six)  hours as needed for mild pain or moderate pain (for knee pain).   Yes Historical Provider, MD   Physical Exam: Filed Vitals:   01/02/15 1830 01/02/15 1855 01/02/15 1900 01/02/15 1903  BP: 148/75  148/90   Pulse: 68 71 66   Temp:    97.9 F (36.6 C)  TempSrc:      Resp:  15 16   Height:      Weight:      SpO2: 96% 95% 97%     Wt Readings from Last 3 Encounters:  01/02/15 139.254 kg (307 lb)  05/17/14 142.883 kg (315 lb)  04/04/14 142.429 kg (314 lb)    General:  Appears calm and comfortable. Morbidly obese. Eyes: PERRL, normal lids, irises & conjunctiva ENT: grossly normal hearing, lips & tongue Neck: no LAD, masses or thyromegaly Cardiovascular: Irregularly irregular, consistent with atrial fibrillation. Ventricular rate is not elevated. Telemetry: Atrial fibrillation Respiratory: CTA bilaterally, no w/r/r. Normal respiratory effort. Abdomen: soft, ntnd Skin: no rash or induration seen on limited exam Musculoskeletal: Tender in the right anterior chest wall, reproducing his pain. Psychiatric: grossly normal mood and affect, speech fluent and appropriate Neurologic: grossly non-focal.          Labs on Admission:  Basic Metabolic Panel:  Recent Labs Lab 01/02/15 1659  NA 139  K 4.2  CL 103  CO2 32  GLUCOSE 102*  BUN 16  CREATININE 0.91  CALCIUM 9.2   Liver Function Tests:  Recent Labs Lab 01/02/15 1659  AST 19  ALT 14  ALKPHOS 86  BILITOT 1.2  PROT 7.3  ALBUMIN 3.6   No results for input(s): LIPASE, AMYLASE in the last 168 hours. No results for input(s): AMMONIA in the last 168 hours. CBC:  Recent Labs Lab 01/02/15 1659  WBC 8.2  NEUTROABS 4.6  HGB 15.7  HCT 46.4  MCV 96.7  PLT 143*   Cardiac Enzymes:  Recent Labs Lab 01/02/15 1659  TROPONINI <0.03    BNP (last 3 results) No results for input(s): PROBNP in the last 8760 hours. CBG:  Recent Labs Lab 01/02/15 1811  GLUCAP 89    Radiological Exams on Admission: Dg Chest 2  View  01/02/2015   CLINICAL DATA:  RIGHT-sided chest pain. Onset of symptoms last night. Shortness of breath with exertion.  EXAM: CHEST  2 VIEW  COMPARISON:  Chest CT 06/02/2006.  FINDINGS: Emphysema is present within increased AP diameter the chest and flattening of the hemidiaphragms. Suboptimal lateral view because the arms are not fully raised over head. Borderline heart size for projection. Large RIGHT pericardial fat pad is present. When comparing today's exam to previous scout images from CT, the configuration of the pulmonary hila is similar.  IMPRESSION: No acute cardiopulmonary disease. Borderline heart size. Emphysema.   Electronically Signed   By: Dereck Ligas M.D.   On: 01/02/2015 16:40    EKG: Independently reviewed. Atrial fibrillation, ventricular rate control, no acute ST-T wave changes.  Assessment/Plan   1. Right-sided chest pain, atypical. The pain appears to be musculoskeletal as it is reproducible on palpation. Serial cardiac enzymes. Will check a d-dimer to see if we need to order a CT chest angiogram to evaluate for primary embolism. Echocardiogram. Cardiology consultation in the morning.  2. New onset atrial fibrillation-it is not clear how long he has had the atrial fibrillation. If the atrial fibrillation is related to the beginning of symptoms yesterday, we can wait to see if he goes back into sinus rhythm, otherwise he may need anticoagulation. Cardiology recommendations will be appreciated. 3. Morbid obesity-we discussed nutrition in terms of reducing weight. 4. Possible sleep apnea-his wife tells me that she has noticed that he stops breathing at night. He will likely need a sleep study.  Further recommendations will depend on patient's hospital progress.   Code Status: Full code \  DVT Prophylaxis: Lovenox.  Family Communication: I discussed the plan with the patient and patient's wife at the bedside.   Disposition Plan: Home when medically stable.   Time  spent: 60 minutes.  Doree Albee [facility] Pager [phone].

## 2015-01-02 NOTE — ED Notes (Signed)
PT c/o right sided chest pain that started last night and pt reports some SOB on exertion.

## 2015-01-02 NOTE — ED Provider Notes (Signed)
CSN: [id number]     Arrival date & time 01/02/15  1609 History   First MD Initiated Contact with Patient 01/02/15 1654     Chief Complaint  Patient presents with  . Chest Pain     (Consider location/radiation/quality/duration/timing/severity/associated sxs/prior Treatment) Patient is a 69 y.o. male presenting with chest pain. The history is provided by the patient (the pt complains of chest pain.  worse with movement).  Chest Pain Pain location:  R chest Pain quality: aching   Pain radiates to:  Does not radiate Pain radiates to the back: no   Pain severity:  Mild Onset quality:  Gradual Timing:  Intermittent Chronicity:  New Associated symptoms: no abdominal pain, no back pain, no cough, no fatigue and no headache     Past Medical History  Diagnosis Date  . Varicose veins   . Hypertension   . Hyperlipidemia    Past Surgical History  Procedure Laterality Date  . Vein ligation and stripping      left leg  . Pr vein bypass graft,aorto-fem-pop    . Abdominal aortic aneurysm repair     Family History  Problem Relation Age of Onset  . Cancer Mother   . Cancer Father   . Diabetes Father   . Heart disease Father   . Hyperlipidemia Father   . Cancer Sister    History  Substance Use Topics  . Smoking status: Never Smoker   . Smokeless tobacco: Never Used  . Alcohol Use: No    Review of Systems  Constitutional: Negative for appetite change and fatigue.  HENT: Negative for congestion, ear discharge and sinus pressure.   Eyes: Negative for discharge.  Respiratory: Negative for cough.   Cardiovascular: Positive for chest pain.  Gastrointestinal: Negative for abdominal pain and diarrhea.  Genitourinary: Negative for frequency and hematuria.  Musculoskeletal: Negative for back pain.  Skin: Negative for rash.  Neurological: Negative for seizures and headaches.  Psychiatric/Behavioral: Negative for hallucinations.      Allergies  Review of patient's allergies  indicates no known allergies.  Home Medications   Prior to Admission medications   Medication Sig Start Date End Date Taking? Authorizing Provider  ibuprofen (ADVIL,MOTRIN) 200 MG tablet Take 400 mg by mouth every 6 (six) hours as needed for mild pain or moderate pain (for knee pain).   Yes Historical Provider, MD   BP 148/90 mmHg  Pulse 66  Temp(Src) 97.9 F (36.6 C) (Oral)  Resp 16  Ht 5' 7.5" (1.715 m)  Wt 307 lb (139.254 kg)  BMI 47.35 kg/m2  SpO2 97% Physical Exam  Constitutional: He is oriented to person, place, and time. He appears well-developed.  HENT:  Head: Normocephalic.  Eyes: Conjunctivae and EOM are normal. No scleral icterus.  Neck: Neck supple. No thyromegaly present.  Cardiovascular: Normal rate.  Exam reveals no gallop and no friction rub.   No murmur heard. Irregular heart beat  Pulmonary/Chest: No stridor. He has no wheezes. He has no rales. He exhibits no tenderness.  Abdominal: He exhibits no distension. There is no tenderness. There is no rebound.  Musculoskeletal: Normal range of motion. He exhibits no edema.  Lymphadenopathy:    He has no cervical adenopathy.  Neurological: He is oriented to person, place, and time. He exhibits normal muscle tone. Coordination normal.  Skin: No rash noted. No erythema.  Psychiatric: He has a normal mood and affect. His behavior is normal.    ED Course  Procedures (including critical care time) Labs Review  Labs Reviewed  CBC WITH DIFFERENTIAL - Abnormal; Notable for the following:    Platelets 143 (*)    All other components within normal limits  COMPREHENSIVE METABOLIC PANEL - Abnormal; Notable for the following:    Glucose, Bld 102 (*)    GFR calc non Af Amer 85 (*)    Anion gap 4 (*)    All other components within normal limits  TROPONIN I  CBG MONITORING, ED    Imaging Review Dg Chest 2 View  01/02/2015   CLINICAL DATA:  RIGHT-sided chest pain. Onset of symptoms last night. Shortness of breath with  exertion.  EXAM: CHEST  2 VIEW  COMPARISON:  Chest CT 06/02/2006.  FINDINGS: Emphysema is present within increased AP diameter the chest and flattening of the hemidiaphragms. Suboptimal lateral view because the arms are not fully raised over head. Borderline heart size for projection. Large RIGHT pericardial fat pad is present. When comparing today's exam to previous scout images from CT, the configuration of the pulmonary hila is similar.  IMPRESSION: No acute cardiopulmonary disease. Borderline heart size. Emphysema.   Electronically Signed   By: Dereck Ligas M.D.   On: 01/02/2015 16:40     EKG Interpretation   Date/Time:  Monday January 02 2015 16:15:11 EST Ventricular Rate:  72 PR Interval:    QRS Duration: 106 QT Interval:  388 QTC Calculation: 424 R Axis:   65 Text Interpretation:  Atrial fibrillation Abnormal ECG Confirmed by Harneet Noblett   MD, Broadus John [phone]) on 01/02/2015 5:05:16 PM      MDM   Final diagnoses:  Chest pain at rest    Admit,  Afib,  Chest pain   Maudry Diego, MD 01/02/15 1910

## 2015-01-03 ENCOUNTER — Encounter (HOSPITAL_COMMUNITY): Payer: Self-pay | Admitting: Adult Health

## 2015-01-03 DIAGNOSIS — I4891 Unspecified atrial fibrillation: Secondary | ICD-10-CM | POA: Diagnosis not present

## 2015-01-03 DIAGNOSIS — R079 Chest pain, unspecified: Secondary | ICD-10-CM

## 2015-01-03 DIAGNOSIS — I379 Nonrheumatic pulmonary valve disorder, unspecified: Secondary | ICD-10-CM

## 2015-01-03 LAB — LIPID PANEL
CHOL/HDL RATIO: 3.1 ratio
Cholesterol: 113 mg/dL (ref 0–200)
HDL: 37 mg/dL — AB (ref >39)
LDL CALC: 55 mg/dL (ref 0–99)
Triglycerides: 104 mg/dL (ref <150)
VLDL: 21 mg/dL (ref 0–40)

## 2015-01-03 LAB — MAGNESIUM: MAGNESIUM: 1.7 mg/dL (ref 1.5–2.5)

## 2015-01-03 LAB — COMPREHENSIVE METABOLIC PANEL
ALT: 13 U/L (ref 0–53)
AST: 19 U/L (ref 0–37)
Albumin: 3.1 g/dL — ABNORMAL LOW (ref 3.5–5.2)
Alkaline Phosphatase: 89 U/L (ref 39–117)
Anion gap: 7 (ref 5–15)
BILIRUBIN TOTAL: 1.3 mg/dL — AB (ref 0.3–1.2)
BUN: 14 mg/dL (ref 6–23)
CALCIUM: 8.3 mg/dL — AB (ref 8.4–10.5)
CHLORIDE: 102 meq/L (ref 96–112)
CO2: 28 mmol/L (ref 19–32)
Creatinine, Ser: 0.86 mg/dL (ref 0.50–1.35)
GFR, EST NON AFRICAN AMERICAN: 87 mL/min — AB (ref >90)
GLUCOSE: 147 mg/dL — AB (ref 70–99)
Potassium: 4 mmol/L (ref 3.5–5.1)
Sodium: 137 mmol/L (ref 135–145)
Total Protein: 6.3 g/dL (ref 6.0–8.3)

## 2015-01-03 LAB — TROPONIN I: Troponin I: <0.03 ng/mL (ref <0.031)

## 2015-01-03 LAB — CBC
HEMATOCRIT: 42.6 % (ref 39.0–52.0)
Hemoglobin: 14.4 g/dL (ref 13.0–17.0)
MCH: 32.7 pg (ref 26.0–34.0)
MCHC: 33.8 g/dL (ref 30.0–36.0)
MCV: 96.6 fL (ref 78.0–100.0)
Platelets: 140 10*3/uL — ABNORMAL LOW (ref 150–400)
RBC: 4.41 MIL/uL (ref 4.22–5.81)
RDW: 12.9 % (ref 11.5–15.5)
WBC: 7.6 10*3/uL (ref 4.0–10.5)

## 2015-01-03 LAB — HEMOGLOBIN A1C
Hgb A1c MFr Bld: 5.8 % — ABNORMAL HIGH (ref <5.7)
MEAN PLASMA GLUCOSE: 120 mg/dL — AB (ref <117)

## 2015-01-03 NOTE — Progress Notes (Signed)
Subjective: He was admitted with atypical chest pain but was found to have atrial fibrillation. It is unclear how long he has been in atrial fibrillation because other than his chest pain he is asymptomatic. He has not been seen in my office in at least a year. He had previously been on antihypertensives and I believe on medications for lipids but he says he has not been taking anything. His chest pain is better.  Objective: Vital signs in last 24 hours: Temp:  [97.8 F (36.6 C)-98.3 F (36.8 C)] 97.8 F (36.6 C) (01/05 0551) Pulse Rate:  [66-83] 82 (01/05 0551) Resp:  [15-22] 16 (01/05 0551) BP: (136-175)/(75-96) 136/78 mmHg (01/05 0551) SpO2:  [95 %-98 %] 95 % (01/05 0551) Weight:  [139.254 kg (307 lb)-140.025 kg (308 lb 11.2 oz)] 140.025 kg (308 lb 11.2 oz) (01/04 2104) Weight change:  Last BM Date: 01/02/15  Intake/Output from previous day: 01/04 0701 - 01/05 0700 In: 240 [P.O.:240] Out: -   PHYSICAL EXAM General appearance: alert, cooperative, no distress and morbidly obese Resp: clear to auscultation bilaterally Cardio: He has atrial fibrillation with well-controlled ventricular response GI: soft, non-tender; bowel sounds normal; no masses,  no organomegaly Extremities: He has severe varicose veins  Lab Results:  Results for orders placed or performed during the hospital encounter of 01/02/15 (from the past 48 hour(s))  CBC with Differential     Status: Abnormal   Collection Time: 01/02/15  4:59 PM  Result Value Ref Range   WBC 8.2 4.0 - 10.5 K/uL   RBC 4.80 4.22 - 5.81 MIL/uL   Hemoglobin 15.7 13.0 - 17.0 g/dL   HCT 46.4 39.0 - 52.0 %   MCV 96.7 78.0 - 100.0 fL   MCH 32.7 26.0 - 34.0 pg   MCHC 33.8 30.0 - 36.0 g/dL   RDW 12.9 11.5 - 15.5 %   Platelets 143 (L) 150 - 400 K/uL   Neutrophils Relative % 57 43 - 77 %   Neutro Abs 4.6 1.7 - 7.7 K/uL   Lymphocytes Relative 26 12 - 46 %   Lymphs Abs 2.2 0.7 - 4.0 K/uL   Monocytes Relative 12 3 - 12 %   Monocytes  Absolute 1.0 0.1 - 1.0 K/uL   Eosinophils Relative 5 0 - 5 %   Eosinophils Absolute 0.4 0.0 - 0.7 K/uL   Basophils Relative 0 0 - 1 %   Basophils Absolute 0.0 0.0 - 0.1 K/uL  Comprehensive metabolic panel     Status: Abnormal   Collection Time: 01/02/15  4:59 PM  Result Value Ref Range   Sodium 139 135 - 145 mmol/L    Comment: Please note change in reference range.   Potassium 4.2 3.5 - 5.1 mmol/L    Comment: Please note change in reference range.   Chloride 103 96 - 112 mEq/L   CO2 32 19 - 32 mmol/L   Glucose, Bld 102 (H) 70 - 99 mg/dL   BUN 16 6 - 23 mg/dL   Creatinine, Ser 0.91 0.50 - 1.35 mg/dL   Calcium 9.2 8.4 - 10.5 mg/dL   Total Protein 7.3 6.0 - 8.3 g/dL   Albumin 3.6 3.5 - 5.2 g/dL   AST 19 0 - 37 U/L   ALT 14 0 - 53 U/L   Alkaline Phosphatase 86 39 - 117 U/L   Total Bilirubin 1.2 0.3 - 1.2 mg/dL   GFR calc non Af Amer 85 (L) >90 mL/min   GFR calc Af Amer >90 >90  mL/min    Comment: (NOTE) The eGFR has been calculated using the CKD EPI equation. This calculation has not been validated in all clinical situations. eGFR's persistently <90 mL/min signify possible Chronic Kidney Disease.    Anion gap 4 (L) 5 - 15  Troponin I     Status: None   Collection Time: 01/02/15  4:59 PM  Result Value Ref Range   Troponin I <0.03 <0.031 ng/mL    Comment:        NO INDICATION OF MYOCARDIAL INJURY. Please note change in reference range.   TSH     Status: None   Collection Time: 01/02/15  4:59 PM  Result Value Ref Range   TSH 3.404 0.350 - 4.500 uIU/mL  CBG monitoring, ED     Status: None   Collection Time: 01/02/15  6:11 PM  Result Value Ref Range   Glucose-Capillary 89 70 - 99 mg/dL  Troponin I     Status: None   Collection Time: 01/02/15  7:40 PM  Result Value Ref Range   Troponin I <0.03 <0.031 ng/mL    Comment:        NO INDICATION OF MYOCARDIAL INJURY. Please note change in reference range.   D-dimer, quantitative     Status: Abnormal   Collection Time:  01/02/15  7:40 PM  Result Value Ref Range   D-Dimer, Quant 0.95 (H) 0.00 - 0.48 ug/mL-FEU    Comment:        AT THE INHOUSE ESTABLISHED CUTOFF VALUE OF 0.48 ug/mL FEU, THIS ASSAY HAS BEEN DOCUMENTED IN THE LITERATURE TO HAVE A SENSITIVITY AND NEGATIVE PREDICTIVE VALUE OF AT LEAST 98 TO 99%.  THE TEST RESULT SHOULD BE CORRELATED WITH AN ASSESSMENT OF THE CLINICAL PROBABILITY OF DVT / VTE.   Troponin I     Status: None   Collection Time: 01/03/15  1:00 AM  Result Value Ref Range   Troponin I <0.03 <0.031 ng/mL    Comment:        NO INDICATION OF MYOCARDIAL INJURY. Please note change in reference range.   Troponin I     Status: None   Collection Time: 01/03/15  6:31 AM  Result Value Ref Range   Troponin I <0.03 <0.031 ng/mL    Comment:        NO INDICATION OF MYOCARDIAL INJURY. Please note change in reference range.   Comprehensive metabolic panel     Status: Abnormal   Collection Time: 01/03/15  6:31 AM  Result Value Ref Range   Sodium 137 135 - 145 mmol/L    Comment: Please note change in reference range.   Potassium 4.0 3.5 - 5.1 mmol/L    Comment: Please note change in reference range.   Chloride 102 96 - 112 mEq/L   CO2 28 19 - 32 mmol/L   Glucose, Bld 147 (H) 70 - 99 mg/dL   BUN 14 6 - 23 mg/dL   Creatinine, Ser 0.86 0.50 - 1.35 mg/dL   Calcium 8.3 (L) 8.4 - 10.5 mg/dL   Total Protein 6.3 6.0 - 8.3 g/dL   Albumin 3.1 (L) 3.5 - 5.2 g/dL   AST 19 0 - 37 U/L   ALT 13 0 - 53 U/L   Alkaline Phosphatase 89 39 - 117 U/L   Total Bilirubin 1.3 (H) 0.3 - 1.2 mg/dL   GFR calc non Af Amer 87 (L) >90 mL/min   GFR calc Af Amer >90 >90 mL/min    Comment: (NOTE) The eGFR has  been calculated using the CKD EPI equation. This calculation has not been validated in all clinical situations. eGFR's persistently <90 mL/min signify possible Chronic Kidney Disease.    Anion gap 7 5 - 15  CBC     Status: Abnormal   Collection Time: 01/03/15  6:31 AM  Result Value Ref Range    WBC 7.6 4.0 - 10.5 K/uL   RBC 4.41 4.22 - 5.81 MIL/uL   Hemoglobin 14.4 13.0 - 17.0 g/dL   HCT 42.6 39.0 - 52.0 %   MCV 96.6 78.0 - 100.0 fL   MCH 32.7 26.0 - 34.0 pg   MCHC 33.8 30.0 - 36.0 g/dL   RDW 12.9 11.5 - 15.5 %   Platelets 140 (L) 150 - 400 K/uL  Lipid panel     Status: Abnormal   Collection Time: 01/03/15  6:31 AM  Result Value Ref Range   Cholesterol 113 0 - 200 mg/dL   Triglycerides 104 <150 mg/dL   HDL 37 (L) >39 mg/dL   Total CHOL/HDL Ratio 3.1 RATIO   VLDL 21 0 - 40 mg/dL   LDL Cholesterol 55 0 - 99 mg/dL    Comment:        Total Cholesterol/HDL:CHD Risk Coronary Heart Disease Risk Table                     Men   Women  1/2 Average Risk   3.4   3.3  Average Risk       5.0   4.4  2 X Average Risk   9.6   7.1  3 X Average Risk  23.4   11.0        Use the calculated Patient Ratio above and the CHD Risk Table to determine the patient's CHD Risk.        ATP III CLASSIFICATION (LDL):  <100     mg/dL   Optimal  100-129  mg/dL   Near or Above                    Optimal  130-159  mg/dL   Borderline  160-189  mg/dL   High  >190     mg/dL   Very High     ABGS No results for input(s): PHART, PO2ART, TCO2, HCO3 in the last 72 hours.  Invalid input(s): PCO2 CULTURES No results found for this or any previous visit (from the past 240 hour(s)). Studies/Results: Dg Chest 2 View  01/02/2015   CLINICAL DATA:  RIGHT-sided chest pain. Onset of symptoms last night. Shortness of breath with exertion.  EXAM: CHEST  2 VIEW  COMPARISON:  Chest CT 06/02/2006.  FINDINGS: Emphysema is present within increased AP diameter the chest and flattening of the hemidiaphragms. Suboptimal lateral view because the arms are not fully raised over head. Borderline heart size for projection. Large RIGHT pericardial fat pad is present. When comparing today's exam to previous scout images from CT, the configuration of the pulmonary hila is similar.  IMPRESSION: No acute cardiopulmonary disease.  Borderline heart size. Emphysema.   Electronically Signed   By: Dereck Ligas M.D.   On: 01/02/2015 16:40   Ct Angio Chest Pe W/cm &/or Wo Cm  01/02/2015   CLINICAL DATA:  Acute onset of right chest pain for 2 days. Elevated D-dimer. Atrial fibrillation. Initial encounter.  EXAM: CT ANGIOGRAPHY CHEST WITH CONTRAST  TECHNIQUE: Multidetector CT imaging of the chest was performed using the standard protocol during bolus administration of intravenous  contrast. Multiplanar CT image reconstructions and MIPs were obtained to evaluate the vascular anatomy.  CONTRAST:  181m OMNIPAQUE IOHEXOL 350 MG/ML SOLN  COMPARISON:  Chest radiograph performed earlier today at 4:28 p.m., and CTA of the chest from 06/02/2006  FINDINGS: There is no evidence of pulmonary embolus.  The lungs appear essentially clear bilaterally. Evaluation is mildly suboptimal due to motion artifact. There is no evidence of significant focal consolidation, pleural effusion or pneumothorax. No masses are identified; no abnormal focal contrast enhancement is seen.  Diffuse coronary artery calcification is seen. The mediastinum is otherwise unremarkable. No mediastinal lymphadenopathy is seen. No pericardial effusion is identified. The great vessels are grossly unremarkable in appearance. No axillary lymphadenopathy is seen. The thyroid gland is unremarkable in appearance.  The visualized portions of the liver and spleen are unremarkable. The visualized portions of the pancreas, gallbladder, stomach and right adrenal gland are within normal limits. The left adrenal gland is not well assessed.  No acute osseous abnormalities are seen.  Review of the MIP images confirms the above findings.  IMPRESSION: 1. No evidence of pulmonary embolus. 2. Lungs clear bilaterally. 3. Diffuse coronary artery calcification noted.   Electronically Signed   By: JGarald BaldingM.D.   On: 01/02/2015 21:41    Medications:  Prior to Admission:  Prescriptions prior to admission   Medication Sig Dispense Refill Last Dose  . ibuprofen (ADVIL,MOTRIN) 200 MG tablet Take 400 mg by mouth every 6 (six) hours as needed for mild pain or moderate pain (for knee pain).   01/02/2015 at Unknown time   Scheduled: . enoxaparin (LOVENOX) injection  40 mg Subcutaneous Q24H  . Influenza vac split quadrivalent PF  0.5 mL Intramuscular Tomorrow-1000  . pneumococcal 23 valent vaccine  0.5 mL Intramuscular Tomorrow-1000  . sodium chloride  3 mL Intravenous Q12H  . sodium chloride  3 mL Intravenous Q12H   Continuous:  PFKC:[id number] ondansetron **OR** ondansetron (ZOFRAN) IV  Assesment: He has atypical chest pain. He has atrial fibrillation which may be new onset. He is morbidly obese. He probably has sleep apnea based on his wife's history. He has not received medical care in at least a year. Active Problems:   Chest pain   New onset atrial fibrillation   Morbid obesity    Plan: He will have echocardiogram. He will have cardiology consultation. He will need sleep study as an outpatient.    LOS: 1 day   Shawntelle Ungar L 01/03/2015, 9:18 AM

## 2015-01-03 NOTE — Progress Notes (Signed)
*  PRELIMINARY RESULTS* Echocardiogram 2D Echocardiogram has been performed.  Leavy Cella 01/03/2015, 4:00 PM

## 2015-01-03 NOTE — Consult Note (Signed)
CARDIOLOGY CONSULT NOTE   [name] MRN: [id number] DOB/AGE: [id number] 69 y.o.  Admit Date: 01/02/2015 Referring Physician: [name] MD Primary Physician: [name], MD Consulting Cardiologist: Carlyle Dolly MD Primary Cardiologist:  New Reason for Consultation: Atypical Chest Pain-New Onset Afib  Clinical Summary [name] is a 69 y.o.male with no prior cardiac history, but long standing history of morbid obesity, varicose veins and probable OSA, (but not officially tested for this), who presented to ER after experiencing right sided chest pain and was found on EKG to be in atrial fib. He has not been seen by physician in over 2 years. He does not take any medications.   He has been having fatigue and on and off right sided chest discomfort for 4-6 months. He states the pain is constant, and started again on Sunday afternoon. Lasted all night and into the next day. Waxing and waning  but never completely going away. He did not notice that his heart rate was irregular. His wife states that he has had decreased energy for at least 6 months. She also states that he snores and stops breathing at night.     In ER BP 175/96, HR 72, O2 Sat 98%. D-Dimer elevated at 0.95, mild thrombocytopenia with platelets at 143. TSH 3.4, Troponin 0.30. CXR, no acute pulmonary disease, emphysema. EKG Atrial fib with ICRBBB, rate of 74 bpm. He was treated with NTG one inch, morphine 2 mg and LMWH.  Currently essentially pain free, but sore in the upper right chest.   No Known Allergies  Medications Scheduled Medications: . enoxaparin (LOVENOX) injection  40 mg Subcutaneous Q24H  . Influenza vac split quadrivalent PF  0.5 mL Intramuscular Tomorrow-1000  . pneumococcal 23 valent vaccine  0.5 mL Intramuscular Tomorrow-1000  . sodium chloride  3 mL Intravenous Q12H  . sodium chloride  3 mL Intravenous Q12H      PRN Medications: acetaminophen, ondansetron **OR**  ondansetron (ZOFRAN) IV   Past Medical History  Diagnosis Date  . Varicose veins   . Hypertension   . Hyperlipidemia   . New onset atrial fibrillation 01/02/2015  . Morbid obesity 01/02/2015    Past Surgical History  Procedure Laterality Date  . Vein ligation and stripping      left leg  . Pr vein bypass graft,[id number]-fem-pop    . Abdominal aortic aneurysm repair      Family History  Problem Relation Age of Onset  . Cancer Mother   . Cancer Father   . Diabetes Father   . Heart disease Father   . Hyperlipidemia Father   . Cancer Sister     Social History [name] reports that he has never smoked. He has never used smokeless tobacco. [name] reports that he does not drink alcohol.  Review of Systems Complete review of systems are found to be negative unless outlined in H&P above.  Physical Examination Blood pressure 136/78, pulse 82, temperature 97.8 F (36.6 C), temperature source Oral, resp. rate 16, height 5' 7.5" (1.715 m), weight 308 lb 11.2 oz (140.025 kg), SpO2 95 %.  Intake/Output Summary (Last 24 hours) at 01/03/15 0918 Last data filed at 01/02/15 2106  Gross per 24 hour  Intake    240 ml  Output      0 ml  Net    240 ml    Telemetry: Atrial fib 60's to 70's.   GEN:No acute distress HEENT: Conjunctiva and lids normal, oropharynx clear with moist mucosa.  Neck: Supple, no elevated JVP or carotid bruits, no thyromegaly. Lungs: Clear to auscultation, nonlabored breathing at rest. Diminished in the bases. Cardiac: Regular rate and rhythm at present, occasional extra systole, no S3 or significant systolic murmur, no pericardial rub. Abdomen: Soft, nontender, no hepatomegaly, bowel sounds present, no guarding or rebound. Extremities: No pitting edema, distal pulses 2+.Multiple varicosities bilateral LE. Skin: Warm and dry. Musculoskeletal: No kyphosis. Neuropsychiatric: Alert and oriented x3, affect grossly appropriate.  Prior Cardiac  Testing/Procedures None  Lab Results  Basic Metabolic Panel:  Recent Labs Lab 01/02/15 1659 01/03/15 0631  NA 139 137  K 4.2 4.0  CL 103 102  CO2 32 28  GLUCOSE 102* 147*  BUN 16 14  CREATININE 0.91 0.86  CALCIUM 9.2 8.3*    Liver Function Tests:  Recent Labs Lab 01/02/15 1659 01/03/15 0631  AST 19 19  ALT 14 13  ALKPHOS 86 89  BILITOT 1.2 1.3*  PROT 7.3 6.3  ALBUMIN 3.6 3.1*    CBC:  Recent Labs Lab 01/02/15 1659 01/03/15 0631  WBC 8.2 7.6  NEUTROABS 4.6  --   HGB 15.7 14.4  HCT 46.4 42.6  MCV 96.7 96.6  PLT 143* 140*    Cardiac Enzymes:  Recent Labs Lab 01/02/15 1659 01/02/15 1940 01/03/15 0100 01/03/15 0631  TROPONINI <0.03 <0.03 <0.03 <0.03    Radiology: Dg Chest 2 View  01/02/2015   CLINICAL DATA:  RIGHT-sided chest pain. Onset of symptoms last night. Shortness of breath with exertion.  EXAM: CHEST  2 VIEW  COMPARISON:  Chest CT 06/02/2006.  FINDINGS: Emphysema is present within increased AP diameter the chest and flattening of the hemidiaphragms. Suboptimal lateral view because the arms are not fully raised over head. Borderline heart size for projection. Large RIGHT pericardial fat pad is present. When comparing today's exam to previous scout images from CT, the configuration of the pulmonary hila is similar.  IMPRESSION: No acute cardiopulmonary disease. Borderline heart size. Emphysema.   Electronically Signed   By: Dereck Ligas M.D.   On: 01/02/2015 16:40   Ct Angio Chest Pe W/cm &/or Wo Cm  01/02/2015   CLINICAL DATA:  Acute onset of right chest pain for 2 days. Elevated D-dimer. Atrial fibrillation. Initial encounter.  EXAM: CT ANGIOGRAPHY CHEST WITH CONTRAST  TECHNIQUE: Multidetector CT imaging of the chest was performed using the standard protocol during bolus administration of intravenous contrast. Multiplanar CT image reconstructions and MIPs were obtained to evaluate the vascular anatomy.  CONTRAST:  137mL OMNIPAQUE IOHEXOL 350 MG/ML  SOLN  COMPARISON:  Chest radiograph performed earlier today at 4:28 p.m., and CTA of the chest from 06/02/2006  FINDINGS: There is no evidence of pulmonary embolus.  The lungs appear essentially clear bilaterally. Evaluation is mildly suboptimal due to motion artifact. There is no evidence of significant focal consolidation, pleural effusion or pneumothorax. No masses are identified; no abnormal focal contrast enhancement is seen.  Diffuse coronary artery calcification is seen. The mediastinum is otherwise unremarkable. No mediastinal lymphadenopathy is seen. No pericardial effusion is identified. The great vessels are grossly unremarkable in appearance. No axillary lymphadenopathy is seen. The thyroid gland is unremarkable in appearance.  The visualized portions of the liver and spleen are unremarkable. The visualized portions of the pancreas, gallbladder, stomach and right adrenal gland are within normal limits. The left adrenal gland is not well assessed.  No acute osseous abnormalities are seen.  Review of the MIP images confirms the above findings.  IMPRESSION: 1. No evidence  of pulmonary embolus. 2. Lungs clear bilaterally. 3. Diffuse coronary artery calcification noted.   Electronically Signed   By: Garald Balding M.D.   On: 01/02/2015 21:41    ECG: Atrial fib rate of 74 bpm.    Impression and Recommendations:  1.Atypical Chest Pain: Appears more musculoskeletal as it is constant, right sided, and not associated with significant dyspnea, and non-radiating. Troponin is negative X 4. EKG shows atrial fib, but no acute ST-T wave abnormalities. CVRF are obesity, FH (valvular disease only no CAD that he knows of), age and male gender. Can consider 2-day NM study due to body habitus this may be a difficult penetration to achieve. Will check lipids for risk stratification.   2.New onset Atrial fib: Uncertain how long this has been going on. He has symptoms of OSA per his wife, along with body habitus. HR  is low normal without AV nodal blocking agents. In fact, he is not on any medications at home. TSH is normal CHADS VASC Score 2. Will order echo with Definity contrast. Likely related to OSA, although not officially diagnosed. I have discussed possibility of anticoagulation with the patient. Will discuss with Dr. Harl Bowie.  3. Hypertension: States never been diagnosed. BP well controlled on NTG paste.   4. Morbid Obesity: Calorie restrictions and increased exercise once cardiac evaluation is completed.      Signed: Phill Myron. Lawrence NP [place]  01/03/2015, 9:18 AM Co-Sign MD  Patient seen and discussed with NP Purcell Nails, I agree with her documentation above. 69 yo male hx of HTN and hyperlipidemia presents with chest pain. Chest pain started Sunday afternoon. Right sided sharp 8/10 pain with no other associated symptoms. Worst with movement and deep breathing. Lasted all day Sunday without relief and into Monday (over 24 hrs). Pain is reproducible on exam.   Echo pending CT PE no PE, diffuse coronary calficitation CXR no acute process EKG afib, no ischemic changes.  TSH 3.4, K 4.2, Cr 0.91, Hgb 15.7, Plt 143, trop neg x 4. TC 113, TG 104, LDL 55  Atypical chest pain in character, duration, and it is reproducible. No evidence of ACS by EKG or enzymes. Do not think this pain is cardiac related. His CT chest did show some coronary calcification, he has multiple CAD risk factors including age, HTN, and sister with MI in her 31s. Would recommend outpatient Lexiscan MPI 2 day protocol for further risk stratification, this does not need to be done as inpatient. EKG showed afib, unclear chronicity as he does not have any palpitations. Heart rates low normal to normal without AV nodal agents, will not start at this time. Echo pending. CHADS2Vasc score at least 2, discussed anticoag and will start eliuqis 5mg  bid. He will also need outpatient sleep study. Defer picking HTN agent until HgbA1c and echo back as  he may require an agent with secondary benefits.    Zandra Abts MD

## 2015-01-03 NOTE — Progress Notes (Signed)
UR chart review completed.  

## 2015-01-03 NOTE — Care Management Note (Signed)
    Page 1 of 1   01/04/2015     1:50:39 PM CARE MANAGEMENT NOTE 01/04/2015  Patient:  [name], [name]   Account Number:  [id number]  Date Initiated:  01/03/2015  Documentation initiated by:  Theophilus Kinds  Subjective/Objective Assessment:   Pt admitted from home with afib and CP. Pt lives with his wife and will return home at [employer]. Pt is independent with ADl's.     Action/Plan:   Benefits check in progress for eliquis. No other CM needs noted.   Anticipated DC Date:  01/04/2015   Anticipated DC Plan:  [place]  CM consult      Choice offered to / List presented to:             Status of service:  Completed, signed off Medicare Important Message given?   (If response is "NO", the following Medicare IM given date fields will be blank) Date Medicare IM given:   Medicare IM given by:   Date Additional Medicare IM given:   Additional Medicare IM given by:    Discharge Disposition:  HOME/SELF CARE  Per UR Regulation:    If discussed at Long Length of Stay Meetings, dates discussed:    Comments:  01/04/15 [place], RN BSN CM Anticipate discharge today. Eliquis card given to pt for free 30 days. No other CM needs noted.  01/03/15 [place], RN BSN CM

## 2015-01-04 MED ORDER — APIXABAN 5 MG PO TABS: 5.0000 mg | ORAL_TABLET | Freq: Two times a day (BID) | ORAL | Status: DC

## 2015-01-04 MED ORDER — LISINOPRIL 10 MG PO TABS
10.0000 mg | ORAL_TABLET | Freq: Every day | ORAL | Status: DC
Start: 2015-01-04 — End: 2015-01-04
  Administered 2015-01-04: 10 mg via ORAL
  Filled 2015-01-04: qty 1

## 2015-01-04 MED ORDER — LISINOPRIL 10 MG PO TABS: 10.0000 mg | ORAL_TABLET | Freq: Every day | ORAL | Status: DC

## 2015-01-04 MED ORDER — APIXABAN 5 MG PO TABS
5.0000 mg | ORAL_TABLET | Freq: Two times a day (BID) | ORAL | Status: DC
Start: 2015-01-04 — End: 2015-01-04
  Administered 2015-01-04: 5 mg via ORAL
  Filled 2015-01-04: qty 1

## 2015-01-04 NOTE — Progress Notes (Signed)
[name], male   DOB: 30-Apr-1946, 69 y.o.   MRN: [id number]     Subjective:    No palpitations overnight  Objective:   Temp:  [97.5 F (36.4 C)-97.9 F (36.6 C)] 97.5 F (36.4 C) (01/06 0640) Pulse Rate:  [78-79] 79 (01/06 0640) Resp:  [18-22] 22 (01/06 0640) BP: (121-150)/(59-90) 141/75 mmHg (01/06 0640) SpO2:  [95 %-97 %] 95 % (01/06 0640) Last BM Date: 01/03/15  Filed Weights   01/02/15 1617 01/02/15 2104  Weight: 307 lb (139.254 kg) 308 lb 11.2 oz (140.025 kg)    Intake/Output Summary (Last 24 hours) at 01/04/15 0857 Last data filed at 01/03/15 2100  Gross per 24 hour  Intake    720 ml  Output      0 ml  Net    720 ml    Telemetry: afib rates 50-70s  Exam:  General: NAD   Resp: CTAB  Cardiac: irreg, no m/r/g, no JVD, no carotid bruits  PZ:WCHENID soft, NT, ND  MSK:no LE edema  Neuro: no focal deficits   Lab Results:  Basic Metabolic Panel:  Recent Labs Lab 01/02/15 1659 01/03/15 0628 01/03/15 0631  NA 139  --  137  K 4.2  --  4.0  CL 103  --  102  CO2 32  --  28  GLUCOSE 102*  --  147*  BUN 16  --  14  CREATININE 0.91  --  0.86  CALCIUM 9.2  --  8.3*  MG  --  1.7  --     Liver Function Tests:  Recent Labs Lab 01/02/15 1659 01/03/15 0631  AST 19 19  ALT 14 13  ALKPHOS 86 89  BILITOT 1.2 1.3*  PROT 7.3 6.3  ALBUMIN 3.6 3.1*    CBC:  Recent Labs Lab 01/02/15 1659 01/03/15 0631  WBC 8.2 7.6  HGB 15.7 14.4  HCT 46.4 42.6  MCV 96.7 96.6  PLT 143* 140*    Cardiac Enzymes:  Recent Labs Lab 01/02/15 1940 01/03/15 0100 01/03/15 0631  TROPONINI <0.03 <0.03 <0.03    BNP: No results for input(s): PROBNP in the last 8760 hours.  Coagulation: No results for input(s): INR in the last 168 hours.  ECG:   Medications:   Scheduled Medications: . enoxaparin (LOVENOX) injection  40 mg Subcutaneous Q24H  . sodium chloride  3 mL Intravenous Q12H  . sodium chloride  3 mL Intravenous Q12H      Infusions:     PRN Medications:  acetaminophen, ondansetron **OR** ondansetron (ZOFRAN) IV     Assessment/Plan    1. Afib - new diagnosis this admission, unclear chronicity as patient is asymptomatic - low normal to normal heart rates, has not required AV nodal agents. - echo with normal LVEF 55-60%,  - patient to start eliquis 5mg  bid for CHADS2Vasc score of 2 (age, HTN). Spoke with Dr Luan Pulling, patient is to go by his office to pick up samples and Rx coupon.  - patient will need outpatient sleep study  2. Atypical chest pain - positional pain, worst with breathing and palpation. No evidence of ACS by EKG or enzymes - prn pain meds per primary team  3. CAD - incidental finding of coronary calcification on CT scan, unclear functionality. Will need to have outpatient Lexiscan 2 day protocol that we will arrange as outpatient.   4. HTN - start lisionpril 10mg  daily.     [place] for discharge from cardiac standpoint. He will f/u with NP  [place] in 2 weeks. Will arrange outpatient stress prior. Will sign off inpatient care.    Carlyle Dolly, M.D.

## 2015-01-04 NOTE — Progress Notes (Signed)
He says he doesn't feel as well today. Cardiology consult noted and appreciated. He can be discharged

## 2015-01-04 NOTE — Discharge Instructions (Signed)

## 2015-01-09 ENCOUNTER — Other Ambulatory Visit: Payer: Self-pay

## 2015-01-09 DIAGNOSIS — R079 Chest pain, unspecified: Secondary | ICD-10-CM

## 2015-01-10 ENCOUNTER — Encounter (HOSPITAL_COMMUNITY): Payer: Self-pay

## 2015-01-10 ENCOUNTER — Encounter (HOSPITAL_COMMUNITY)
Admission: RE | Admit: 2015-01-10 | Discharge: 2015-01-10 | Disposition: A | Discharge status: Home / Self Care | Payer: Medicare Other | Source: Ambulatory Visit | Attending: Cardiology | Admitting: Cardiology

## 2015-01-10 ENCOUNTER — Ambulatory Visit (HOSPITAL_COMMUNITY)
Admit: 2015-01-10 | Discharge: 2015-01-10 | Disposition: A | Discharge status: Home / Self Care | Payer: Medicare Other | Source: Ambulatory Visit | Attending: Cardiology | Admitting: Cardiology

## 2015-01-10 DIAGNOSIS — I2510 Atherosclerotic heart disease of native coronary artery without angina pectoris: Secondary | ICD-10-CM | POA: Insufficient documentation

## 2015-01-10 DIAGNOSIS — R079 Chest pain, unspecified: Secondary | ICD-10-CM | POA: Insufficient documentation

## 2015-01-10 DIAGNOSIS — I10 Essential (primary) hypertension: Secondary | ICD-10-CM | POA: Insufficient documentation

## 2015-01-10 DIAGNOSIS — I4891 Unspecified atrial fibrillation: Secondary | ICD-10-CM | POA: Insufficient documentation

## 2015-01-10 MED ORDER — REGADENOSON 0.4 MG/5ML IV SOLN
0.4000 mg | Freq: Once | INTRAVENOUS | Status: AC | PRN
  Administered 2015-01-10: 0.4 mg via INTRAVENOUS

## 2015-01-10 MED ORDER — TECHNETIUM TC 99M SESTAMIBI GENERIC - CARDIOLITE
30.0000 | Freq: Once | INTRAVENOUS | Status: AC | PRN
  Administered 2015-01-10: 30 via INTRAVENOUS

## 2015-01-10 MED ORDER — SODIUM CHLORIDE 0.9 % IJ SOLN
INTRAMUSCULAR | Status: AC
  Administered 2015-01-10: 10 mL via INTRAVENOUS
  Filled 2015-01-10: qty 3

## 2015-01-10 MED ORDER — REGADENOSON 0.4 MG/5ML IV SOLN
INTRAVENOUS | Status: AC
  Administered 2015-01-10: 0.4 mg via INTRAVENOUS
  Filled 2015-01-10: qty 5

## 2015-01-10 MED ORDER — SODIUM CHLORIDE 0.9 % IJ SOLN
10.0000 mL | INTRAMUSCULAR | Status: DC | PRN
  Administered 2015-01-10: 10 mL via INTRAVENOUS
  Filled 2015-01-10: qty 10

## 2015-01-10 NOTE — Progress Notes (Signed)
Stress Lab Nurses Notes - Ozil Stettler 01/10/2015 Reason for doing test: CAD and Chest Pain Type of test: Wille Glaser Nurse performing test: Gerrit Halls, RN Nuclear Medicine Tech: Dyanne Carrel Echo Tech: Not Applicable MD performing test: Koneswaran/M.Bonnell Public PA Family MD: Luan Pulling Test explained and consent signed: Yes.   IV started: Saline lock flushed, No redness or edema and Saline lock started in radiology Symptoms: None Treatment/Intervention: None Reason test stopped: protocol completed After recovery IV was: Discontinued via X-ray tech and No redness or edema Patient to return to Nuc. Med at : 12:00 Patient discharged: Home Patient's Condition upon discharge was: stable Comments: During test BP 72/49 & HR 100. Recovery BP 84/61 & HR 83.  Symptoms resolved in recovery. Geanie Cooley T

## 2015-01-11 ENCOUNTER — Encounter (HOSPITAL_COMMUNITY)
Admission: RE | Admit: 2015-01-11 | Discharge: 2015-01-11 | Disposition: A | Discharge status: Home / Self Care | Payer: Medicare Other | Source: Ambulatory Visit | Attending: Cardiology | Admitting: Cardiology

## 2015-01-11 DIAGNOSIS — I4891 Unspecified atrial fibrillation: Secondary | ICD-10-CM | POA: Diagnosis not present

## 2015-01-11 DIAGNOSIS — R079 Chest pain, unspecified: Secondary | ICD-10-CM | POA: Diagnosis present

## 2015-01-11 DIAGNOSIS — I10 Essential (primary) hypertension: Secondary | ICD-10-CM | POA: Diagnosis not present

## 2015-01-11 DIAGNOSIS — I2510 Atherosclerotic heart disease of native coronary artery without angina pectoris: Secondary | ICD-10-CM | POA: Diagnosis not present

## 2015-01-11 MED ORDER — TECHNETIUM TC 99M SESTAMIBI - CARDIOLITE
25.0000 | Freq: Once | INTRAVENOUS | Status: AC | PRN
  Administered 2015-01-11: 08:00:00 24 via INTRAVENOUS

## 2015-01-12 NOTE — Discharge Summary (Signed)
Physician Discharge Summary  [name] MRN: [id number] DOB/AGE: Jan 22, 1946 69 y.o. Primary Care Physician:Quintez Maselli L, MD Admit date: 01/02/2015 Discharge date: 01/12/2015    Discharge Diagnoses:   Active Problems:   Chest pain   New onset atrial fibrillation   Morbid obesity     Medication List    TAKE these medications        apixaban 5 MG Tabs tablet  Commonly known as:  ELIQUIS  Take 1 tablet (5 mg total) by mouth 2 (two) times daily.     ibuprofen 200 MG tablet  Commonly known as:  ADVIL,MOTRIN  Take 400 mg by mouth every 6 (six) hours as needed for mild pain or moderate pain (for knee pain).     lisinopril 10 MG tablet  Commonly known as:  PRINIVIL,ZESTRIL  Take 1 tablet (10 mg total) by mouth daily.        Discharged Condition: Improved    Consults: Cardiology  Significant Diagnostic Studies: Dg Chest 2 View  01/02/2015   CLINICAL DATA:  RIGHT-sided chest pain. Onset of symptoms last night. Shortness of breath with exertion.  EXAM: CHEST  2 VIEW  COMPARISON:  Chest CT 06/02/2006.  FINDINGS: Emphysema is present within increased AP diameter the chest and flattening of the hemidiaphragms. Suboptimal lateral view because the arms are not fully raised over head. Borderline heart size for projection. Large RIGHT pericardial fat pad is present. When comparing today's exam to previous scout images from CT, the configuration of the pulmonary hila is similar.  IMPRESSION: No acute cardiopulmonary disease. Borderline heart size. Emphysema.   Electronically Signed   By: Dereck Ligas M.D.   On: 01/02/2015 16:40   Ct Angio Chest Pe W/cm &/or Wo Cm  01/02/2015   CLINICAL DATA:  Acute onset of right chest pain for 2 days. Elevated D-dimer. Atrial fibrillation. Initial encounter.  EXAM: CT ANGIOGRAPHY CHEST WITH CONTRAST  TECHNIQUE: Multidetector CT imaging of the chest was performed using the standard protocol during bolus administration of intravenous  contrast. Multiplanar CT image reconstructions and MIPs were obtained to evaluate the vascular anatomy.  CONTRAST:  174mL OMNIPAQUE IOHEXOL 350 MG/ML SOLN  COMPARISON:  Chest radiograph performed earlier today at 4:28 p.m., and CTA of the chest from 06/02/2006  FINDINGS: There is no evidence of pulmonary embolus.  The lungs appear essentially clear bilaterally. Evaluation is mildly suboptimal due to motion artifact. There is no evidence of significant focal consolidation, pleural effusion or pneumothorax. No masses are identified; no abnormal focal contrast enhancement is seen.  Diffuse coronary artery calcification is seen. The mediastinum is otherwise unremarkable. No mediastinal lymphadenopathy is seen. No pericardial effusion is identified. The great vessels are grossly unremarkable in appearance. No axillary lymphadenopathy is seen. The thyroid gland is unremarkable in appearance.  The visualized portions of the liver and spleen are unremarkable. The visualized portions of the pancreas, gallbladder, stomach and right adrenal gland are within normal limits. The left adrenal gland is not well assessed.  No acute osseous abnormalities are seen.  Review of the MIP images confirms the above findings.  IMPRESSION: 1. No evidence of pulmonary embolus. 2. Lungs clear bilaterally. 3. Diffuse coronary artery calcification noted.   Electronically Signed   By: Garald Balding M.D.   On: 01/02/2015 21:41   Nm Myocar Multi W/spect W/wall Motion / Ef  01/11/2015   CLINICAL DATA:  69 year old male with coronary artery calcification on CT scan as well as hypertension, atrial fibrillation, and chest pain  referred for an ischemic evaluation.  EXAM: MYOCARDIAL IMAGING WITH SPECT (REST AND PHARMACOLOGIC-STRESS - 2 DAY PROTOCOL)  GATED LEFT VENTRICULAR WALL MOTION STUDY  LEFT VENTRICULAR EJECTION FRACTION  TECHNIQUE: Standard myocardial SPECT imaging was performed after resting intravenous injection of 10 mCi Tc-32m sestamibi.  Subsequently, on a second day, intravenous infusion of Lexiscan was performed under the supervision of the Cardiology staff. At peak effect of the drug, 30 mCi Tc-36m sestamibi was injected intravenously and standard myocardial SPECT imaging was performed. Quantitative gated imaging was also performed to evaluate left ventricular wall motion, and estimate left ventricular ejection fraction.  COMPARISON:  None.  FINDINGS: Stress/ECG data: The patient was stressed according to the 2 day Lexi scan protocol. The heart rate ranged from 76 up to 118 beats per min. The blood pressure averaged 105/51. No chest pain was reported.  Baseline ECG demonstrated atrial fibrillation with no significant changes seen with stress. No ischemic ST-T abnormalities.  Perfusion: No decreased activity in the left ventricle on stress imaging to suggest reversible ischemia or infarction.  Wall Motion: Normal left ventricular wall motion. No left ventricular dilation.  Left Ventricular Ejection Fraction: 58 %  End diastolic volume 992 ml  End systolic volume 44 ml  IMPRESSION: 1. No reversible ischemia or infarction.  2. Normal left ventricular wall motion.  3. Left ventricular ejection fraction 58%  4. Low-risk stress test findings*.  *2012 Appropriate Use Criteria for Coronary Revascularization Focused Update: J Am Coll Cardiol. 4268;34(1):962-229. http://content.airportbarriers.com.aspx?articleid=1201161   Electronically Signed   By: Kate Sable   On: 01/11/2015 15:29    Lab Results: Basic Metabolic Panel: No results for input(s): NA, K, CL, CO2, GLUCOSE, BUN, CREATININE, CALCIUM, MG, PHOS in the last 72 hours. Liver Function Tests: No results for input(s): AST, ALT, ALKPHOS, BILITOT, PROT, ALBUMIN in the last 72 hours.   CBC: No results for input(s): WBC, NEUTROABS, HGB, HCT, MCV, PLT in the last 72 hours.  No results found for this or any previous visit (from the past 240 hour(s)).   Hospital Course: This is a  69 year old who came to the emergency department because of chest pain. He has a history of hypertension and morbid obesity chronic venous stasis. When he came to the emergency department he was noted to have atrial fibrillation which had not been present previously. His chest pain was somewhat atypical. He ruled out for MI. He had cardiology consultation and had normal stress testing. He did not require rate control for his atrial fib. He was started on anticoagulation. By the time of discharge he was much improved.  Discharge Exam: Blood pressure 126/65, pulse 71, temperature 97.8 F (36.6 C), temperature source Oral, resp. rate 20, height 5' 7.5" (1.715 m), weight 140.025 kg (308 lb 11.2 oz), SpO2 97 %. He is awake and alert. He is morbidly obese. His chest shows bilateral rhonchi. His heart is irregular  Disposition: Home he will follow-up with cardiology and in my office        Follow-up Information    Follow up with [facility] On 01/10/2015.   Why:  at 10:00 am.  Register at the main entrance for stress test   Contact information:   [address] [phone]      Follow up with Jory Sims, NP On 01/17/2015.   Specialty:  Nurse Practitioner   Why:  at 1:50 pm   Contact information:   [place] [address] [phone]  Signed: Malgorzata Albert L   01/12/2015, 6:56 AM

## 2015-01-16 NOTE — Progress Notes (Addendum)
    HPI: [name] his 69 year old patient of Dr. Harl Bowie that we are following post hospitalization for new onset atrial fibrillation, chest pain, with history of hypertension.  The patient had a nuclear medicine stress test during hospitalization on 01/11/2015.  There is no reversible ischemia or infarction.  LV EF was 58%.  On discharge, she was placed on apixaban 5 mg twice a day, and the central 10 mg daily.He was not placed on AV nodal blocking agents as his heart rate was well-controlled.   He comes today still feeling tired. He denies chest pain. He admits to dark stools which he thinks is new. No dizziness. Continues to have DOE. He has never been stuided for OSA, despite his obesity.  No Known Allergies  Current Outpatient Prescriptions  Medication Sig Dispense Refill  . apixaban (ELIQUIS) 5 MG TABS tablet Take 1 tablet (5 mg total) by mouth 2 (two) times daily. 60 tablet 12  . ibuprofen (ADVIL,MOTRIN) 200 MG tablet Take 400 mg by mouth every 6 (six) hours as needed for mild pain or moderate pain (for knee pain).    [name] lisinopril (PRINIVIL,ZESTRIL) 10 MG tablet Take 1 tablet (10 mg total) by mouth daily. 30 tablet 12   No current facility-administered medications for this visit.    Past Medical History  Diagnosis Date  . Varicose veins   . New onset atrial fibrillation 01/02/2015  . Morbid obesity 01/02/2015  . Hypertension     Past Surgical History  Procedure Laterality Date  . Vein ligation and stripping      left leg    ROS: Complete review of systems performed and found to be negative unless outlined above  PHYSICAL EXAM BP 120/72 mmHg  Pulse 90  Ht 5\' 8"  (1.727 m)  Wt 304 lb (137.893 kg)  BMI 46.23 kg/m2  SpO2 94% General: Well developed, well nourished, in no acute distress, morbidly obese Head: Eyes PERRLA, No xanthomas.   Normal cephalic and atramatic  Lungs: Clear bilaterally to auscultation and percussion. Heart: HRIR S1 S2, without MRG.  Pulses are 2+ &  equal.            No carotid bruit. No JVD.  No abdominal bruits. No femoral bruits. Abdomen: Bowel sounds are positive, abdomen soft and non-tender without masses or                  Hernia's noted Stomach obese  Msk:  Back normal, normal gait. Normal strength and tone for age. Extremities: No clubbing, cyanosis or edema.  DP +1 Neuro: Alert and oriented X 3. Psych:  Good affect, responds appropriatel  ASSESSMENT AND PLAN

## 2015-01-17 ENCOUNTER — Encounter: Payer: Self-pay | Admitting: Adult Health

## 2015-01-17 ENCOUNTER — Ambulatory Visit (INDEPENDENT_AMBULATORY_CARE_PROVIDER_SITE_OTHER): Payer: Medicare Other | Admitting: Adult Health

## 2015-01-17 VITALS — BP 120/72 | HR 90 | Ht 68.0 in | Wt 304.0 lb

## 2015-01-17 DIAGNOSIS — I4891 Unspecified atrial fibrillation: Secondary | ICD-10-CM

## 2015-01-17 DIAGNOSIS — G4730 Sleep apnea, unspecified: Secondary | ICD-10-CM

## 2015-01-17 DIAGNOSIS — R5383 Other fatigue: Secondary | ICD-10-CM

## 2015-01-17 NOTE — Assessment & Plan Note (Signed)
If he indeed does have OSA, wearing CPAP at night may help him to increase his energy, therefore increasing his activity. I would like to see him on an exercise program, to increase his stamina and lose weight.

## 2015-01-17 NOTE — Patient Instructions (Signed)
Your physician wants you to follow-up in: 6 months.  You will receive a reminder letter in the mail two months in advance. If you don't receive a letter, please call our office to schedule the follow-up appointment.  Your physician recommends that you continue on your current medications as directed. Please refer to the Current Medication list given to you today.  You have been referred to Dr. Johnell Comings are to have as Sleep study done for to Evaluate for Sleep Apnea  Thank you for choosing [facility]!

## 2015-01-17 NOTE — Progress Notes (Deleted)
Name: [name]    DOB: 1946/04/30  Age: 69 y.o.  MR#: [id number]       PCP:  [name], MD      Insurance: Payor: MEDICARE / Plan: MEDICARE PART A AND B / Product Type: *No Product type* /   CC:    Chief Complaint  Patient presents with  . Chest Pain  . Hypertension  . Atrial Fibrillation    VS Filed Vitals:   01/17/15 1416  BP: 120/72  Pulse: 90  Height: 5\' 8"  (1.727 m)  Weight: 304 lb (137.893 kg)  SpO2: 94%    Weights Current Weight  01/17/15 304 lb (137.893 kg)  01/02/15 308 lb 11.2 oz (140.025 kg)  05/17/14 315 lb (142.883 kg)    Blood Pressure  BP Readings from Last 3 Encounters:  01/17/15 120/72  01/04/15 126/65  05/17/14 161/98     Admit date:  (Not on file) Last encounter with RMR:  Visit date not found   Allergy Review of patient's allergies indicates no known allergies.  Current Outpatient Prescriptions  Medication Sig Dispense Refill  . apixaban (ELIQUIS) 5 MG TABS tablet Take 1 tablet (5 mg total) by mouth 2 (two) times daily. 60 tablet 12  . ibuprofen (ADVIL,MOTRIN) 200 MG tablet Take 400 mg by mouth every 6 (six) hours as needed for mild pain or moderate pain (for knee pain).    [name] lisinopril (PRINIVIL,ZESTRIL) 10 MG tablet Take 1 tablet (10 mg total) by mouth daily. 30 tablet 12   No current facility-administered medications for this visit.    Discontinued Meds:   There are no discontinued medications.  Patient Active Problem List   Diagnosis Date Noted  . Chest pain 01/02/2015  . New onset atrial fibrillation 01/02/2015  . Morbid obesity 01/02/2015  . Varicose veins of lower extremities with other complications 76/81/1572    LABS    Component Value Date/Time   NA 137 01/03/2015 0631   NA 139 01/02/2015 1659   K 4.0 01/03/2015 0631   K 4.2 01/02/2015 1659   CL 102 01/03/2015 0631   CL 103 01/02/2015 1659   CO2 28 01/03/2015 0631   CO2 32 01/02/2015 1659   GLUCOSE 147* 01/03/2015 0631   GLUCOSE 102* 01/02/2015 1659    BUN 14 01/03/2015 0631   BUN 16 01/02/2015 1659   CREATININE 0.86 01/03/2015 0631   CREATININE 0.91 01/02/2015 1659   CALCIUM 8.3* 01/03/2015 0631   CALCIUM 9.2 01/02/2015 1659   GFRNONAA 87* 01/03/2015 0631   GFRNONAA 85* 01/02/2015 1659   GFRAA >90 01/03/2015 0631   GFRAA >90 01/02/2015 1659   CMP     Component Value Date/Time   NA 137 01/03/2015 0631   K 4.0 01/03/2015 0631   CL 102 01/03/2015 0631   CO2 28 01/03/2015 0631   GLUCOSE 147* 01/03/2015 0631   BUN 14 01/03/2015 0631   CREATININE 0.86 01/03/2015 0631   CALCIUM 8.3* 01/03/2015 0631   PROT 6.3 01/03/2015 0631   ALBUMIN 3.1* 01/03/2015 0631   AST 19 01/03/2015 0631   ALT 13 01/03/2015 0631   ALKPHOS 89 01/03/2015 0631   BILITOT 1.3* 01/03/2015 0631   GFRNONAA 87* 01/03/2015 0631   GFRAA >90 01/03/2015 0631       Component Value Date/Time   WBC 7.6 01/03/2015 0631   WBC 8.2 01/02/2015 1659   HGB 14.4 01/03/2015 0631   HGB 15.7 01/02/2015 1659   HCT 42.6 01/03/2015 0631   HCT 46.4 01/02/2015  1659   MCV 96.6 01/03/2015 0631   MCV 96.7 01/02/2015 1659    Lipid Panel     Component Value Date/Time   CHOL 113 01/03/2015 0631   TRIG 104 01/03/2015 0631   HDL 37* 01/03/2015 0631   CHOLHDL 3.1 01/03/2015 0631   VLDL 21 01/03/2015 0631   LDLCALC 55 01/03/2015 0631    ABG No results found for: PHART, PCO2ART, PO2ART, HCO3, TCO2, ACIDBASEDEF, O2SAT   Lab Results  Component Value Date   TSH 3.404 01/02/2015   BNP (last 3 results) No results for input(s): PROBNP in the last 8760 hours. Cardiac Panel (last 3 results) No results for input(s): CKTOTAL, CKMB, TROPONINI, RELINDX in the last 72 hours.  Iron/TIBC/Ferritin/ %Sat No results found for: IRON, TIBC, FERRITIN, IRONPCTSAT   EKG Orders placed or performed during the hospital encounter of 01/02/15  . EKG 12-Lead  . EKG 12-Lead  . ED EKG  . ED EKG  . EKG 12-Lead  . EKG 12-Lead  . EKG     Prior Assessment and Plan Problem List as of  01/17/2015      Cardiovascular and Mediastinum   Varicose veins of lower extremities with other complications   New onset atrial fibrillation     Other   Chest pain   Morbid obesity       Imaging: Dg Chest 2 View  01/02/2015   CLINICAL DATA:  RIGHT-sided chest pain. Onset of symptoms last night. Shortness of breath with exertion.  EXAM: CHEST  2 VIEW  COMPARISON:  Chest CT 06/02/2006.  FINDINGS: Emphysema is present within increased AP diameter the chest and flattening of the hemidiaphragms. Suboptimal lateral view because the arms are not fully raised over head. Borderline heart size for projection. Large RIGHT pericardial fat pad is present. When comparing today's exam to previous scout images from CT, the configuration of the pulmonary hila is similar.  IMPRESSION: No acute cardiopulmonary disease. Borderline heart size. Emphysema.   Electronically Signed   By: Dereck Ligas M.D.   On: 01/02/2015 16:40   Ct Angio Chest Pe W/cm &/or Wo Cm  01/02/2015   CLINICAL DATA:  Acute onset of right chest pain for 2 days. Elevated D-dimer. Atrial fibrillation. Initial encounter.  EXAM: CT ANGIOGRAPHY CHEST WITH CONTRAST  TECHNIQUE: Multidetector CT imaging of the chest was performed using the standard protocol during bolus administration of intravenous contrast. Multiplanar CT image reconstructions and MIPs were obtained to evaluate the vascular anatomy.  CONTRAST:  124mL OMNIPAQUE IOHEXOL 350 MG/ML SOLN  COMPARISON:  Chest radiograph performed earlier today at 4:28 p.m., and CTA of the chest from 06/02/2006  FINDINGS: There is no evidence of pulmonary embolus.  The lungs appear essentially clear bilaterally. Evaluation is mildly suboptimal due to motion artifact. There is no evidence of significant focal consolidation, pleural effusion or pneumothorax. No masses are identified; no abnormal focal contrast enhancement is seen.  Diffuse coronary artery calcification is seen. The mediastinum is otherwise  unremarkable. No mediastinal lymphadenopathy is seen. No pericardial effusion is identified. The great vessels are grossly unremarkable in appearance. No axillary lymphadenopathy is seen. The thyroid gland is unremarkable in appearance.  The visualized portions of the liver and spleen are unremarkable. The visualized portions of the pancreas, gallbladder, stomach and right adrenal gland are within normal limits. The left adrenal gland is not well assessed.  No acute osseous abnormalities are seen.  Review of the MIP images confirms the above findings.  IMPRESSION: 1. No evidence of pulmonary  embolus. 2. Lungs clear bilaterally. 3. Diffuse coronary artery calcification noted.   Electronically Signed   By: Garald Balding M.D.   On: 01/02/2015 21:41   Nm Myocar Multi W/spect W/wall Motion / Ef  01/11/2015   CLINICAL DATA:  69 year old male with coronary artery calcification on CT scan as well as hypertension, atrial fibrillation, and chest pain referred for an ischemic evaluation.  EXAM: MYOCARDIAL IMAGING WITH SPECT (REST AND PHARMACOLOGIC-STRESS - 2 DAY PROTOCOL)  GATED LEFT VENTRICULAR WALL MOTION STUDY  LEFT VENTRICULAR EJECTION FRACTION  TECHNIQUE: Standard myocardial SPECT imaging was performed after resting intravenous injection of 10 mCi Tc-53m sestamibi. Subsequently, on a second day, intravenous infusion of Lexiscan was performed under the supervision of the Cardiology staff. At peak effect of the drug, 30 mCi Tc-46m sestamibi was injected intravenously and standard myocardial SPECT imaging was performed. Quantitative gated imaging was also performed to evaluate left ventricular wall motion, and estimate left ventricular ejection fraction.  COMPARISON:  None.  FINDINGS: Stress/ECG data: The patient was stressed according to the 2 day Lexi scan protocol. The heart rate ranged from 76 up to 118 beats per min. The blood pressure averaged 105/51. No chest pain was reported.  Baseline ECG demonstrated atrial  fibrillation with no significant changes seen with stress. No ischemic ST-T abnormalities.  Perfusion: No decreased activity in the left ventricle on stress imaging to suggest reversible ischemia or infarction.  Wall Motion: Normal left ventricular wall motion. No left ventricular dilation.  Left Ventricular Ejection Fraction: 58 %  End diastolic volume 916 ml  End systolic volume 44 ml  IMPRESSION: 1. No reversible ischemia or infarction.  2. Normal left ventricular wall motion.  3. Left ventricular ejection fraction 58%  4. Low-risk stress test findings*.  *2012 Appropriate Use Criteria for Coronary Revascularization Focused Update: J Am Coll Cardiol. 6060;04(5):997-741. http://content.airportbarriers.com.aspx?articleid=1201161   Electronically Signed   By: Kate Sable   On: 01/11/2015 15:29

## 2015-01-17 NOTE — Assessment & Plan Note (Addendum)
He is not on any AV nodal blocking agents as his heart rate is well controlled. He does not have any melena or over bleeding but has noticed that his stool has become darker. I will have a hemoccult of the stool completed for evidence of blood. Eliquis has low risk for GI bleeding but with changes in stool color, will check   I will also have him scheduled for sleep study to evaluate for OSA. He has the body habitus for this. This can be etiology of atrial fib as he was ruled out for ischemia y NM stress test on 01/11/2015. He will be referred to Dr. Luan Pulling once test is completed, if positive,  for further management

## 2015-01-30 ENCOUNTER — Ambulatory Visit: Payer: Medicare Other | Attending: Adult Health | Admitting: Sleep Medicine

## 2015-01-30 DIAGNOSIS — G4761 Periodic limb movement disorder: Secondary | ICD-10-CM | POA: Insufficient documentation

## 2015-01-30 DIAGNOSIS — G4710 Hypersomnia, unspecified: Secondary | ICD-10-CM | POA: Diagnosis present

## 2015-01-30 DIAGNOSIS — G4733 Obstructive sleep apnea (adult) (pediatric): Secondary | ICD-10-CM | POA: Insufficient documentation

## 2015-01-30 DIAGNOSIS — R0683 Snoring: Secondary | ICD-10-CM | POA: Diagnosis not present

## 2015-01-30 DIAGNOSIS — G4730 Sleep apnea, unspecified: Secondary | ICD-10-CM

## 2015-02-03 NOTE — Sleep Study (Signed)
  [place] A. [name], MD     [email or web address]        NOCTURNAL POLYSOMNOGRAM    LOCATION: SLEEP LAB FACILITY: [place]   PHYSICIAN: Darolyn Double A. [name], M.D.   DATE OF STUDY: 01/30/2015.   REFERRING PHYSICIAN: Jory Sims, NP.   INDICATIONS: The patient is a 69 year old male who presents with loud snoring, hypersomnia and fatigue.  MEDICATIONS:  Prior to Admission medications   Medication Sig Start Date End Date Taking? Authorizing Provider  apixaban (ELIQUIS) 5 MG TABS tablet Take 1 tablet (5 mg total) by mouth 2 (two) times daily. 01/04/15   [name], MD  ibuprofen (ADVIL,MOTRIN) 200 MG tablet Take 400 mg by mouth every 6 (six) hours as needed for mild pain or moderate pain (for knee pain).    Historical Provider, MD  lisinopril (PRINIVIL,ZESTRIL) 10 MG tablet Take 1 tablet (10 mg total) by mouth daily. 01/04/15   [name], MD      EPWORTH SLEEPINESS SCALE: 8.   BMI: 46.   ARCHITECTURAL SUMMARY: Total recording time was 409 minutes. Sleep efficiency 58 %. Sleep latency 111 minutes. REM latency 141 minutes. Stage NI 5 %, N2 52 % and N3 19 % and REM sleep 24 %.    RESPIRATORY DATA:  Baseline oxygen saturation is 95 %. The lowest saturation is 70 %. The diagnostic AHI is 30. The RDI is 31. The REM AHI is 58.  LIMB MOVEMENT SUMMARY: PLM index 42.   ELECTROCARDIOGRAM SUMMARY: Average heart rate is 75. Atrial fibrillation is noted throughout the recording.  IMPRESSION:  1. Moderately severe obstructive sleep apnea syndrome. A formal CPAP titration recording is suggested. 2. Severe periodic limb movement disorder sleep. 3. Abnormal architecture with moderately reduced sleep efficiency. 4. Atrial fibrillation.  Thanks for this referral.  Jessah Danser A. [name], M.D. Diplomat, Tax adviser of Sleep Medicine.

## 2015-02-15 ENCOUNTER — Telehealth: Payer: Self-pay | Admitting: Adult Health

## 2015-02-15 NOTE — Telephone Encounter (Signed)
Patient was ordered hemoccult by Curt Bears. Patient took slides to Dr.Hawkins to have them tested.  All 3 were positive.  Patient is on Eliquis. / tgs

## 2015-02-16 ENCOUNTER — Other Ambulatory Visit: Payer: Self-pay | Admitting: *Deleted

## 2015-02-16 DIAGNOSIS — R195 Other fecal abnormalities: Secondary | ICD-10-CM

## 2015-02-16 NOTE — Telephone Encounter (Signed)
Stop Eliquis.  Referral to GI for heme-positive stools,.

## 2015-02-16 NOTE — Telephone Encounter (Signed)
Will forward to Kathryn Lawrence, N.P.  

## 2015-02-16 NOTE — Telephone Encounter (Signed)
Patient notified. All questions answered. Patient voiced understanding.  

## 2015-02-22 ENCOUNTER — Encounter: Payer: Self-pay | Admitting: Gastroenterology

## 2015-02-28 DIAGNOSIS — C801 Malignant (primary) neoplasm, unspecified: Secondary | ICD-10-CM

## 2015-02-28 HISTORY — DX: Malignant (primary) neoplasm, unspecified: C80.1

## 2015-03-01 ENCOUNTER — Other Ambulatory Visit (HOSPITAL_COMMUNITY): Payer: Self-pay | Admitting: Radiology

## 2015-03-01 DIAGNOSIS — G4730 Sleep apnea, unspecified: Secondary | ICD-10-CM

## 2015-03-10 ENCOUNTER — Ambulatory Visit: Payer: Medicare Other | Admitting: Gastroenterology

## 2015-03-13 ENCOUNTER — Ambulatory Visit: Payer: Medicare Other | Attending: Pulmonary Disease | Admitting: Sleep Medicine

## 2015-03-13 DIAGNOSIS — G4733 Obstructive sleep apnea (adult) (pediatric): Secondary | ICD-10-CM | POA: Insufficient documentation

## 2015-03-13 DIAGNOSIS — G4730 Sleep apnea, unspecified: Secondary | ICD-10-CM

## 2015-03-15 ENCOUNTER — Ambulatory Visit (INDEPENDENT_AMBULATORY_CARE_PROVIDER_SITE_OTHER): Payer: Medicare Other | Admitting: Gastroenterology

## 2015-03-15 ENCOUNTER — Other Ambulatory Visit: Payer: Self-pay

## 2015-03-15 ENCOUNTER — Encounter: Payer: Self-pay | Admitting: Gastroenterology

## 2015-03-15 VITALS — BP 114/73 | HR 65 | Temp 97.0°F | Ht 68.0 in | Wt 308.4 lb

## 2015-03-15 DIAGNOSIS — R195 Other fecal abnormalities: Secondary | ICD-10-CM | POA: Insufficient documentation

## 2015-03-15 DIAGNOSIS — R197 Diarrhea, unspecified: Secondary | ICD-10-CM | POA: Diagnosis not present

## 2015-03-15 DIAGNOSIS — R1314 Dysphagia, pharyngoesophageal phase: Secondary | ICD-10-CM

## 2015-03-15 DIAGNOSIS — K921 Melena: Secondary | ICD-10-CM | POA: Diagnosis not present

## 2015-03-15 DIAGNOSIS — R1310 Dysphagia, unspecified: Secondary | ICD-10-CM | POA: Insufficient documentation

## 2015-03-15 DIAGNOSIS — R1319 Other dysphagia: Secondary | ICD-10-CM | POA: Insufficient documentation

## 2015-03-15 MED ORDER — PEG-KCL-NACL-NASULF-NA ASC-C 100 G PO SOLR: 1.0000 | ORAL | Status: DC

## 2015-03-15 MED ORDER — OMEPRAZOLE 20 MG PO CPDR: 20.0000 mg | DELAYED_RELEASE_CAPSULE | Freq: Every day | ORAL | Status: AC

## 2015-03-15 NOTE — Assessment & Plan Note (Signed)
Several week h/o diarrhea appears to have occurred since his hospitalization. Will check for CDiff. Plan for first ever colonoscopy for h/o heme + stool and FH of colon cancer.  I have discussed the risks, alternatives, benefits with regards to but not limited to the risk of reaction to medication, bleeding, infection, perforation and the patient is agreeable to proceed. Written consent to be obtained.

## 2015-03-15 NOTE — Assessment & Plan Note (Signed)
69 y/o male with h/o "black" stool with dark red blood several weeks ago while on Eliquis. Subsequently has returned multiple positive hemoccults. Eliquis held pending GI work up. He takes ibuprofen regularly making PUD a possibility. Recommend EGD+/-ED in the near future. He has some vague esophageal dysphagia.  I have discussed the risks, alternatives, benefits with regards to but not limited to the risk of reaction to medication, bleeding, infection, perforation and the patient is agreeable to proceed. Written consent to be obtained.  Start omeprazole 20mg  daily empirically for PUD. Can stop if EGD unremarkable.

## 2015-03-15 NOTE — Progress Notes (Signed)
Primary Care Physician:  [name], MD  Primary Gastroenterologist:  Barney Drain, MD   Chief Complaint  Patient presents with  . Blood In Stools    heme positive  . Abdominal Pain  . Diarrhea    HPI:  [name] is a 69 y.o. male here for further evaluation of heme positive stools/blood in stools. Patient hospitalized in 12/2014 with acute onset Afib. Discharge on Eliquis. Reports that he has couple of episodes of very dark, "black" stools with dark red blood on the edges since then. Once occurred while taking a sleep study last month. Recently returned three hemoccults that were all positive. Currently having 6 weeks of watery stools, 3-4 per day. Worse since off eliquis. No abx. No ill contacts lately. Previously had normal daily BMs. Off Eliquis pending GI work up. No further melena. complainso f stomach pain along the waistline. No heartburn or vomiting. Some vague dysphagia to solids and more so with liquids.   Takes about six ibuprofens daily for knee pain.   No prior EGD or colonoscopy.    Current Outpatient Prescriptions  Medication Sig Dispense Refill  . ibuprofen (ADVIL,MOTRIN) 200 MG tablet Take 400 mg by mouth every 6 (six) hours as needed for mild pain or moderate pain (for knee pain).    [name] lisinopril (PRINIVIL,ZESTRIL) 10 MG tablet Take 1 tablet (10 mg total) by mouth daily. 30 tablet 12   No current facility-administered medications for this visit.    Allergies as of 03/15/2015  . (No Known Allergies)    Past Medical History  Diagnosis Date  . Varicose veins   . New onset atrial fibrillation 01/02/2015  . Morbid obesity 01/02/2015  . Hypertension   . Sleep apnea     Past Surgical History  Procedure Laterality Date  . Vein ligation and stripping      left leg    Family History  Problem Relation Age of Onset  . Cancer Mother     colon, age mid-62s  . Cancer Father     liver  . Heart disease Father   . Hyperlipidemia Father   . Cancer  Sister     slow "blood" cancer  . Diabetes Sister     History   Social History  . Marital Status: Married    Spouse Name: N/A  . Number of Children: 2  . Years of Education: N/A   Occupational History  . Not on file.   Social History Main Topics  . Smoking status: Never Smoker   . Smokeless tobacco: Never Used  . Alcohol Use: No  . Drug Use: No  . Sexual Activity: No   Other Topics Concern  . Not on file   Social History Narrative      ROS:  General: Negative for anorexia, weight loss, fever, chills, fatigue, weakness. Eyes: Negative for vision changes.  ENT: Negative for hoarseness, difficulty swallowing , nasal congestion. CV: Negative for chest pain, angina, palpitations, dyspnea on exertion, peripheral edema.  Respiratory: Negative for dyspnea at rest, dyspnea on exertion, cough, sputum, wheezing.  GI: See history of present illness. GU:  Negative for dysuria, hematuria, urinary incontinence, urinary frequency, nocturnal urination.  MS: Negative for joint pain, low back pain.  Derm: Negative for rash or itching.  Neuro: Negative for weakness, abnormal sensation, seizure, frequent headaches, memory loss, confusion.  Psych: Negative for anxiety, depression, suicidal ideation, hallucinations.  Endo: Negative for unusual weight change.  Heme: Negative for bruising or bleeding. Allergy: Negative for  rash or hives.    Physical Examination:  BP 114/73 mmHg  Pulse 65  Temp(Src) 97 F (36.1 C) (Oral)  Ht 5\' 8"  (1.727 m)  Wt 308 lb 6.4 oz (139.889 kg)  BMI 46.90 kg/m2   General: Well-nourished, well-developed in no acute distress.  Head: Normocephalic, atraumatic.   Eyes: Conjunctiva pink, no icterus. Mouth: Oropharyngeal mucosa moist and pink , no lesions erythema or exudate. Neck: Supple without thyromegaly, masses, or lymphadenopathy.  Lungs: Clear to auscultation bilaterally.  Heart: Regular rate and rhythm, no murmurs rubs or gallops.  Abdomen: Bowel  sounds are normal, nontender, nondistended, no hepatosplenomegaly or masses, no abdominal bruits or    hernia , no rebound or guarding.   Rectal: deferred Extremities: No lower extremity edema. No clubbing or deformities.  Neuro: Alert and oriented x 4 , grossly normal neurologically.  Skin: Warm and dry, no rash or jaundice.   Psych: Alert and cooperative, normal mood and affect.  Labs: Lab Results  Component Value Date   WBC 7.6 01/03/2015   HGB 14.4 01/03/2015   HCT 42.6 01/03/2015   MCV 96.6 01/03/2015   PLT 140* 01/03/2015   Lab Results  Component Value Date   CREATININE 0.86 01/03/2015   BUN 14 01/03/2015   NA 137 01/03/2015   K 4.0 01/03/2015   CL 102 01/03/2015   CO2 28 01/03/2015   Lab Results  Component Value Date   ALT 13 01/03/2015   AST 19 01/03/2015   ALKPHOS 89 01/03/2015   BILITOT 1.3* 01/03/2015     Imaging Studies: No results found.

## 2015-03-15 NOTE — Patient Instructions (Signed)
1. For the diarrhea, I would like for you to collect one specimen and return to the lab to rule out infection. 2. Please start omeprazole once daily before breakfast to cover for possible stomach ulcer. 3. Colonoscopy and upper endoscopy as planned. Please see separate instructions.

## 2015-03-15 NOTE — Sleep Study (Signed)
  [place] A. [name], MD     [email or web address]        NOCTURNAL POLYSOMNOGRAM    LOCATION: SLEEP LAB FACILITY: [place]   PHYSICIAN: Mairin Lindsley A. [name], M.D.   DATE OF STUDY: 03/13/2015.   REFERRING PHYSICIAN: [name].   INDICATIONS: The patient is a 69 year old who presents with a known history of obstructive sleep apnea syndrome documented by previous diagnostic polysomnography.  MEDICATIONS:  Prior to Admission medications   Medication Sig Start Date End Date Taking? Authorizing Provider  ibuprofen (ADVIL,MOTRIN) 200 MG tablet Take 400 mg by mouth every 6 (six) hours as needed for mild pain or moderate pain (for knee pain).    Historical Provider, MD  lisinopril (PRINIVIL,ZESTRIL) 10 MG tablet Take 1 tablet (10 mg total) by mouth daily. 01/04/15   [name], MD  omeprazole (PRILOSEC) 20 MG capsule Take 1 capsule (20 mg total) by mouth daily. 03/15/15   [name], PA-C  peg 3350 powder (MOVIPREP) 100 G SOLR Take 1 kit (200 g total) by mouth as directed. 03/15/15   [name], MD      EPWORTH SLEEPINESS SCALE: 9.   BMI: 46.   ARCHITECTURAL SUMMARY: Total recording time was 433 minutes. Sleep efficiency 52 %. Sleep latency 70 minutes. REM latency 87 minutes. Stage NI 18 %, N2 43 % and N3 17 % and REM sleep 22 %.    RESPIRATORY DATA:  Baseline oxygen saturation is 94 %. The lowest saturation is 80 %. The patient was placed on positive pressure starting at 5 and increased to 13. The optimal pressure is 12 with resolution of obstructive events and good tolerance.  LIMB MOVEMENT SUMMARY: PLM index 50.   ELECTROCARDIOGRAM SUMMARY: Average heart rate is 73 with atrial fibrillation observed.   IMPRESSION:  1. Obstructive sleep apnea syndrome which responds well to his CPAP of 12. 2. Atrial fibrillation.  Thanks for this referral.  Graycen Sadlon A. [name], M.D. Diplomat, Tax adviser of Sleep Medicine.

## 2015-03-15 NOTE — Progress Notes (Signed)
cc'ed to pcp °

## 2015-03-25 LAB — CLOSTRIDIUM DIFFICILE BY PCR: CDIFFPCR: NOT DETECTED

## 2015-03-28 ENCOUNTER — Telehealth: Payer: Self-pay | Admitting: Gastroenterology

## 2015-03-28 ENCOUNTER — Encounter (HOSPITAL_COMMUNITY)
Admission: RE | Disposition: A | Discharge status: Home / Self Care | Payer: Self-pay | Source: Ambulatory Visit | Attending: Gastroenterology

## 2015-03-28 ENCOUNTER — Ambulatory Visit (HOSPITAL_COMMUNITY)
Admission: RE | Admit: 2015-03-28 | Discharge: 2015-03-28 | Disposition: A | Discharge status: Home / Self Care | Payer: Medicare Other | Source: Ambulatory Visit | Attending: Gastroenterology | Admitting: Gastroenterology

## 2015-03-28 ENCOUNTER — Other Ambulatory Visit: Payer: Self-pay

## 2015-03-28 ENCOUNTER — Encounter (HOSPITAL_COMMUNITY): Payer: Self-pay

## 2015-03-28 DIAGNOSIS — R1314 Dysphagia, pharyngoesophageal phase: Secondary | ICD-10-CM

## 2015-03-28 DIAGNOSIS — I10 Essential (primary) hypertension: Secondary | ICD-10-CM | POA: Diagnosis not present

## 2015-03-28 DIAGNOSIS — R1013 Epigastric pain: Secondary | ICD-10-CM | POA: Diagnosis present

## 2015-03-28 DIAGNOSIS — I4891 Unspecified atrial fibrillation: Secondary | ICD-10-CM | POA: Diagnosis not present

## 2015-03-28 DIAGNOSIS — K2950 Unspecified chronic gastritis without bleeding: Secondary | ICD-10-CM | POA: Diagnosis not present

## 2015-03-28 DIAGNOSIS — R1310 Dysphagia, unspecified: Secondary | ICD-10-CM | POA: Diagnosis present

## 2015-03-28 DIAGNOSIS — R197 Diarrhea, unspecified: Secondary | ICD-10-CM | POA: Diagnosis not present

## 2015-03-28 DIAGNOSIS — Z79899 Other long term (current) drug therapy: Secondary | ICD-10-CM | POA: Diagnosis not present

## 2015-03-28 DIAGNOSIS — K6389 Other specified diseases of intestine: Secondary | ICD-10-CM

## 2015-03-28 DIAGNOSIS — C186 Malignant neoplasm of descending colon: Secondary | ICD-10-CM | POA: Diagnosis not present

## 2015-03-28 DIAGNOSIS — Z791 Long term (current) use of non-steroidal anti-inflammatories (NSAID): Secondary | ICD-10-CM | POA: Diagnosis not present

## 2015-03-28 DIAGNOSIS — C189 Malignant neoplasm of colon, unspecified: Secondary | ICD-10-CM

## 2015-03-28 DIAGNOSIS — G4730 Sleep apnea, unspecified: Secondary | ICD-10-CM | POA: Diagnosis not present

## 2015-03-28 DIAGNOSIS — K2970 Gastritis, unspecified, without bleeding: Secondary | ICD-10-CM | POA: Diagnosis not present

## 2015-03-28 DIAGNOSIS — M6289 Other specified disorders of muscle: Secondary | ICD-10-CM | POA: Insufficient documentation

## 2015-03-28 DIAGNOSIS — Z800 Family history of malignant neoplasm of digestive organs: Secondary | ICD-10-CM | POA: Diagnosis not present

## 2015-03-28 DIAGNOSIS — K644 Residual hemorrhoidal skin tags: Secondary | ICD-10-CM | POA: Diagnosis not present

## 2015-03-28 DIAGNOSIS — K921 Melena: Secondary | ICD-10-CM | POA: Diagnosis present

## 2015-03-28 DIAGNOSIS — R195 Other fecal abnormalities: Secondary | ICD-10-CM | POA: Diagnosis not present

## 2015-03-28 DIAGNOSIS — K639 Disease of intestine, unspecified: Secondary | ICD-10-CM | POA: Diagnosis not present

## 2015-03-28 DIAGNOSIS — K5730 Diverticulosis of large intestine without perforation or abscess without bleeding: Secondary | ICD-10-CM | POA: Insufficient documentation

## 2015-03-28 HISTORY — PX: ESOPHAGOGASTRODUODENOSCOPY: SHX5428

## 2015-03-28 HISTORY — PX: ESOPHAGEAL DILATION: SHX303

## 2015-03-28 HISTORY — PX: COLONOSCOPY: SHX5424

## 2015-03-28 LAB — COMPREHENSIVE METABOLIC PANEL
ALBUMIN: 3.1 g/dL — AB (ref 3.5–5.2)
ALK PHOS: 87 U/L (ref 39–117)
ALT: 11 U/L (ref 0–53)
AST: 20 U/L (ref 0–37)
Anion gap: 7 (ref 5–15)
BUN: 11 mg/dL (ref 6–23)
CHLORIDE: 105 mmol/L (ref 96–112)
CO2: 24 mmol/L (ref 19–32)
CREATININE: 0.87 mg/dL (ref 0.50–1.35)
Calcium: 8.4 mg/dL (ref 8.4–10.5)
GFR calc Af Amer: >90 mL/min (ref >90)
GFR calc non Af Amer: 87 mL/min — ABNORMAL LOW (ref >90)
GLUCOSE: 102 mg/dL — AB (ref 70–99)
POTASSIUM: 4.1 mmol/L (ref 3.5–5.1)
SODIUM: 136 mmol/L (ref 135–145)
TOTAL PROTEIN: 6.4 g/dL (ref 6.0–8.3)
Total Bilirubin: 1.1 mg/dL (ref 0.3–1.2)

## 2015-03-28 LAB — CBC
HEMATOCRIT: 38.4 % — AB (ref 39.0–52.0)
Hemoglobin: 13.2 g/dL (ref 13.0–17.0)
MCH: 32.3 pg (ref 26.0–34.0)
MCHC: 34.4 g/dL (ref 30.0–36.0)
MCV: 93.9 fL (ref 78.0–100.0)
Platelets: 141 10*3/uL — ABNORMAL LOW (ref 150–400)
RBC: 4.09 MIL/uL — ABNORMAL LOW (ref 4.22–5.81)
RDW: 12.9 % (ref 11.5–15.5)
WBC: 6.5 10*3/uL (ref 4.0–10.5)

## 2015-03-28 LAB — PROTIME-INR
INR: 1.08 (ref 0.00–1.49)
Prothrombin Time: 14.1 seconds (ref 11.6–15.2)

## 2015-03-28 SURGERY — COLONOSCOPY
Anesthesia: Moderate Sedation

## 2015-03-28 MED ORDER — MEPERIDINE HCL 100 MG/ML IJ SOLN
INTRAMUSCULAR | Status: AC
  Filled 2015-03-28: qty 2

## 2015-03-28 MED ORDER — STERILE WATER FOR IRRIGATION IR SOLN
Status: DC | PRN
  Administered 2015-03-28: 14:00:00

## 2015-03-28 MED ORDER — MIDAZOLAM HCL 5 MG/5ML IJ SOLN
INTRAMUSCULAR | Status: AC
  Filled 2015-03-28: qty 10

## 2015-03-28 MED ORDER — MINERAL OIL PO OIL
TOPICAL_OIL | ORAL | Status: AC
  Filled 2015-03-28: qty 30

## 2015-03-28 MED ORDER — MIDAZOLAM HCL 5 MG/5ML IJ SOLN
INTRAMUSCULAR | Status: DC | PRN
  Administered 2015-03-28: 1 mg via INTRAVENOUS
  Administered 2015-03-28 (×2): 2 mg via INTRAVENOUS
  Administered 2015-03-28 (×2): 1 mg via INTRAVENOUS

## 2015-03-28 MED ORDER — MEPERIDINE HCL 100 MG/ML IJ SOLN
INTRAMUSCULAR | Status: DC | PRN
  Administered 2015-03-28 (×3): 25 mg via INTRAVENOUS

## 2015-03-28 MED ORDER — LIDOCAINE VISCOUS 2 % MT SOLN
OROMUCOSAL | Status: AC
  Filled 2015-03-28: qty 15

## 2015-03-28 MED ORDER — SODIUM CHLORIDE 0.9 % IV SOLN
INTRAVENOUS | Status: DC
  Administered 2015-03-28: 13:00:00 via INTRAVENOUS

## 2015-03-28 NOTE — H&P (Addendum)
  Primary Care Physician:  [name], MD Primary Gastroenterologist:  Dr. Oneida Alar  Pre-Procedure History & Physical: HPI:  [name] is a 69 y.o. male here for ABDOMINAL  PAIN/DIARRHEA/brbpr/dysphagia.  Past Medical History  Diagnosis Date  . Varicose veins   . New onset atrial fibrillation 01/02/2015  . Morbid obesity 01/02/2015  . Hypertension   . Sleep apnea     Past Surgical History  Procedure Laterality Date  . Vein ligation and stripping      left leg    Prior to Admission medications   Medication Sig Start Date End Date Taking? Authorizing Provider  ibuprofen (ADVIL,MOTRIN) 200 MG tablet Take 400 mg by mouth every 6 (six) hours as needed for mild pain or moderate pain (for knee pain).   Yes Historical Provider, MD  lisinopril (PRINIVIL,ZESTRIL) 10 MG tablet Take 1 tablet (10 mg total) by mouth daily. 01/04/15  Yes [name], MD  omeprazole (PRILOSEC) 20 MG capsule Take 1 capsule (20 mg total) by mouth daily. 03/15/15  Yes [name], PA-C  peg 3350 powder (MOVIPREP) 100 G SOLR Take 1 kit (200 g total) by mouth as directed. 03/15/15  Yes [name], MD    Allergies as of 03/15/2015  . (No Known Allergies)    Family History  Problem Relation Age of Onset  . Cancer Mother     colon, age mid-60s  . Cancer Father     liver  . Heart disease Father   . Hyperlipidemia Father   . Cancer Sister     slow "blood" cancer  . Diabetes Sister     History   Social History  . Marital Status: Married    Spouse Name: N/A  . Number of Children: 2  . Years of Education: N/A   Occupational History  . Not on file.   Social History Main Topics  . Smoking status: Never Smoker   . Smokeless tobacco: Never Used  . Alcohol Use: No  . Drug Use: No  . Sexual Activity: No   Other Topics Concern  . Not on file   Social History Narrative    Review of Systems: See HPI, otherwise negative ROS   Physical Exam: BP 138/83 mmHg  Pulse 86  Temp(Src) 97.4  F (36.3 C) (Oral)  Resp 16  SpO2 96% General:   Alert,  pleasant and cooperative in NAD Head:  Normocephalic and atraumatic. Neck:  Supple; Lungs:  Clear throughout to auscultation.    Heart:  irRegular rhythm. Abdomen:  Soft, nontender and nondistended. Normal bowel sounds, without guarding, and without rebound.   Neurologic:  Alert and  oriented x4;  grossly normal neurologically.  Impression/Plan:   ABDOMINAL  PAIN/DIARRHEA/brbpr/dysphagia  PLAN: 1. TCS/EGD/?dil TODAY-BIOPSY

## 2015-03-28 NOTE — Telephone Encounter (Signed)
Dx: OBSTRUCTING COLON MASS, CXR: PA/LAT-COLON CANCER, CT ABD/PELVIS W/ IVC-OBSTRUCTING COLON CANCER. NEEDS IMAGING BEFORE OUTPATIENT VISIT WITH DR. Arnoldo Morale MAR 31 AT 1100.

## 2015-03-28 NOTE — Discharge Instructions (Signed)
Your ABDOMINAL PAIN, RECTAL BLEEDING, AND DIARRHEA RE DUE TO A COLON MASS. YOU HAVE EXTERNAL HEMORRHOIDS. YOU HAVE GASTRITIS DUE TO IBUPROFEN USE. I STRETCHED YOUR ESOPHAGUS DUE YOUR PROBLEMS SWALLOWING. I BIOPSIED YOUR STOMACH.   YOU NEED LABS TODAY.  YOU SHOULD COMPLETE YOUR CHEST X-RAY AND CT SCAN WITHIN THE NEXT 7 DAYS.   SEE DR. Arnoldo Morale THUR MAR 31 AT 1100.  CONTINUE OMEPRAZOLE 30 MINUTES PRIOR TO BREAKFAST.  FOLLOW A SOFT MECHANICAL DIET. AVOID ITEMS THAT CAUSE BLOATING.  MEATS SHOULD BE CHOPPED OR GROUND ONLY. DO NOT EAT CHUNKS OF ANYTHING. SEE INFO BELOW.  YOUR BIOPSY RESULTS WILL BE AVAILABLE IN MY CHART AFTER MAR 31 AND MY OFFICE WILL CONTACT YOU IN 10-14 DAYS WITH YOUR RESULTS.   Follow up in 6 MOS.   REPEAT colonoscopy AFTER NEXT OUTPATIENT VISIT.  STOP ELOQUIS UNTIL FURTHER ADVISED   ENDOSCOPY Care After Read the instructions outlined below and refer to this sheet in the next week. These discharge instructions provide you with general information on caring for yourself after you leave the hospital. While your treatment has been planned according to the most current medical practices available, unavoidable complications occasionally occur. If you have any problems or questions after discharge, call DR. FIELDS, [phone].  ACTIVITY  You may resume your regular activity, but move at a slower pace for the next 24 hours.   Take frequent rest periods for the next 24 hours.   Walking will help get rid of the air and reduce the bloated feeling in your belly (abdomen).   No driving for 24 hours (because of the medicine (anesthesia) used during the test).   You may shower.   Do not sign any important legal documents or operate any machinery for 24 hours (because of the anesthesia used during the test).    NUTRITION  Drink plenty of fluids.   You may resume your normal diet as instructed by your doctor.   Begin with a light meal and progress to your normal diet.  Heavy or fried foods are harder to digest and may make you feel sick to your stomach (nauseated).   Avoid alcoholic beverages for 24 hours or as instructed.    MEDICATIONS  You may resume your normal medications.   WHAT YOU CAN EXPECT TODAY  Some feelings of bloating in the abdomen.   Passage of more gas than usual.   Spotting of blood in your stool or on the toilet paper  .  IF YOU HAD POLYPS REMOVED DURING THE ENDOSCOPY:  Eat a soft diet IF YOU HAVE NAUSEA, BLOATING, ABDOMINAL PAIN, OR VOMITING.    FINDING OUT THE RESULTS OF YOUR TEST Not all test results are available during your visit. DR. Oneida Alar WILL CALL YOU WITHIN 14 DAYS OF YOUR PROCEDUE WITH YOUR RESULTS. Do not assume everything is normal if you have not heard from DR. FIELDS, CALL HER OFFICE AT [phone].  SEEK IMMEDIATE MEDICAL ATTENTION AND CALL THE OFFICE: [phone] IF:  You have more than a spotting of blood in your stool.   Your belly is swollen (abdominal distention).   You are nauseated or vomiting.   You have a temperature over 101F.   You have abdominal pain or discomfort that is severe or gets worse throughout the day.  SOFT MECHANICAL DIET This SOFT MECHANICAL DIET is restricted to:  Foods that are moist, soft-textured, and easy to chew and swallow.   Meats that are ground or are minced no larger than one-quarter inch  pieces. Meats are moist with gravy or sauce added.   Foods that do not include bread or bread-like textures except soft pancakes, well-moistened with syrup or sauce.   Textures with some chewing ability required.   Casseroles without rice.   Cooked vegetables that are less than half an inch in size and easily mashed with a fork. No cooked corn, peas, broccoli, cauliflower, cabbage, Brussels sprouts, asparagus, or other fibrous, non-tender or rubbery cooked vegetables.   Canned fruit except for pineapple. Fruit must be cut into pieces no larger than half an inch in size.     Foods that do not include nuts, seeds, coconut, or sticky textures.   FOOD TEXTURES FOR DYSPHAGIA DIET LEVEL 2 -SOFT MECHANICAL DIET (includes all foods on Dysphagia Diet Level 1 - Pureed, in addition to the foods listed below)  FOOD GROUP: Breads. RECOMMENDED: Soft pancakes, well-moistened with syrup or sauce.  AVOID: All others.  FOOD GROUP: Cereals.  RECOMMENDED: Cooked cereals with little texture, including oatmeal. Unprocessed wheat bran stirred into cereals for bulk. Note: If thin liquids are restricted, it is important that all of the liquid is absorbed into the cereal.  AVOID: All dry cereals and any cooked cereals that may contain flax seeds or other seeds or nuts. Whole-grain, dry, or coarse cereals. Cereals with nuts, seeds, dried fruit, and/or coconut.  FOOD GROUP: Desserts. RECOMMENDED: Pudding, custard. Soft fruit pies with bottom crust only. Canned fruit (excluding pineapple). Soft, moist cakes with icing.Frozen malts, milk shakes, frozen yogurt, eggnog, nutritional supplements, ice cream, sherbet, regular or sugar-free gelatin, or any foods that become thin liquid at either room (70 F) or body temperature (98 F).  AVOID: Dry, coarse cakes and cookies. Anything with nuts, seeds, coconut, pineapple, or dried fruit. Breakfast yogurt with nuts. Rice or bread pudding.  FOOD GROUP: Fats. RECOMMENDED: Butter, margarine, cream for cereal (depending on liquid consistency recommendations), gravy, cream sauces, sour cream, sour cream dips with soft additives, mayonnaise, salad dressings, cream cheese, cream cheese spreads with soft additives, whipped toppings.  AVOID: All fats with coarse or chunky additives.  FOOD GROUP: Fruits. RECOMMENDED: Soft drained, canned, or cooked fruits without seeds or skin. Fresh soft and ripe banana. Fruit juices with a small amount of pulp. If thin liquids are restricted, fruit juices should be thickened to appropriate consistency.  AVOID:  Fresh or frozen fruits. Cooked fruit with skin or seeds. Dried fruits. Fresh, canned, or cooked pineapple.  FOOD GROUP: Meats and Meat Substitutes. (Meat pieces should not exceed 1/4 of an inch cube and should be tender.) RECOMMENDED: Moistened ground or cooked meat, poultry, or fish. Moist ground or tender meat may be served with gravy or sauce. Casseroles without rice. Moist macaroni and cheese, well-cooked pasta with meat sauce, tuna noodle casserole, soft, moist lasagna. Moist meatballs, meatloaf, or fish loaf. Protein salads, such as tuna or egg without large chunks, celery, or onion. Cottage cheese, smooth quiche without large chunks. Poached, scrambled, or soft-cooked eggs (egg yolks should not be runny but should be moist and able to be mashed with butter, margarine, or other moisture added to them). (Cook eggs to 160 F or use pasteurized eggs for safety.) Souffls may have small, soft chunks. Tofu. Well-cooked, slightly mashed, moist legumes, such as baked beans. All meats or protein substitutes should be served with sauces or moistened to help maintain cohesiveness in the oral cavity.  AVOID: Dry meats, tough meats (such as bacon, sausage, hot dogs, bratwurst). Dry casseroles or casseroles with rice  or large chunks. Peanut butter. Cheese slices and cubes. Hard-cooked or crisp fried eggs. Sandwiches.Pizza.  FOOD GROUP: Potatoes and Starches. RECOMMENDED: Well-cooked, moistened, boiled, baked, or mashed potatoes. Well-cooked shredded hash brown potatoes that are not crisp. (All potatoes need to be moist and in sauces.)Well-cooked noodles in sauce. Spaetzel or soft dumplings that have been moistened with butter or gravy.  AVOID: Potato skins and chips. Fried or French-fried potatoes. Rice.  FOOD GROUP: Soups. RECOMMENDED: Soups with easy-to-chew or easy-to-swallow meats or vegetables: Particle sizes in soups should be less than 1/2 inch. Soups will need to be thickened to appropriate  consistency if soup is thinner than prescribed liquid consistency.  AVOID: Soups with large chunks of meat and vegetables. Soups with rice, corn, peas.  FOOD GROUP: Vegetables. RECOMMENDED: All soft, well-cooked vegetables. Vegetables should be less than a half inch. Should be easily mashed with a fork.  AVOID: Cooked corn and peas. Broccoli, cabbage, Brussels sprouts, asparagus, or other fibrous, non-tender or rubbery cooked vegetables.  FOOD GROUP: Miscellaneous. RECOMMENDED: Jams and preserves without seeds, jelly. Sauces, salsas, etc., that may have small tender chunks less than 1/2 inch. Soft, smooth chocolate bars that are easily chewed.  AVOID: Seeds, nuts, coconut, or sticky foods. Chewy candies such as caramels or licorice.    REFLUX  SYMPTOMS Common symptoms of GERD are heartburn (burning in your chest). This is worse when lying down or bending over. It may also cause belching, or difficulty swallowing, and indigestion. Some of the things which make GERD worse are:  Increased weight pushes on stomach making acid rise more easily.   Smoking markedly increases acid production.   Alcohol decreases lower esophageal sphincter pressure (valve between stomach and esophagus), allowing acid from stomach into esophagus.   Late evening meals and going to bed with a full stomach increases pressure.   Anything that causes an increase in acid production.    HOME CARE INSTRUCTIONS  Try to achieve and maintain an ideal body weight.   Avoid drinking alcoholic beverages.   DO NOT smokE.   Do not wear tight clothing around your chest or stomach.   Eat smaller meals and eat more frequently. This keeps your stomach from getting too full. Eat slowly.   Do not lie down for 2 or 3 hours after eating. Do not eat or drink anything 1 to 2 hours before going to bed.   Avoid caffeine beverages (colas, coffee, cocoa, tea), fatty foods, citrus fruits and all other foods and drinks that  contain acid and that seem to increase the problems.   Avoid bending over, especially after eating OR STRAINING. Anything that increases the pressure in your belly increases the amount of acid that may be pushed up into your esophagus.   Gastritis  Gastritis is an inflammation (the body's way of reacting to injury and/or infection) of the stomach. It is often caused by viral or bacterial (germ) infections. It can also be caused BY ASPIRIN, BC/GOODY POWDER'S, (IBUPROFEN) MOTRIN, OR ALEVE (NAPROXEN), chemicals (including alcohol), SPICY FOODS, and medications. This illness may be associated with generalized malaise (feeling tired, not well), UPPER ABDOMINAL STOMACH cramps, and fever. One common bacterial cause of gastritis is an organism known as H. Pylori. This can be treated with antibiotics.   Hemorrhoids Hemorrhoids are dilated (enlarged) veins around the rectum. Sometimes clots will form in the veins. This makes them swollen and painful. These are called thrombosed hemorrhoids. Causes of hemorrhoids include:  Constipation.   Straining to have a  bowel movement.   HEAVY LIFTING HOME CARE INSTRUCTIONS  Eat a well balanced diet and drink 6 to 8 glasses of water every day to avoid constipation. You may also use a bulk laxative.   Avoid straining to have bowel movements.   Keep anal area dry and clean.   Do not use a donut shaped pillow or sit on the toilet for long periods. This increases blood pooling and pain.   Move your bowels when your body has the urge; this will require less straining and will decrease pain and pressure.

## 2015-03-28 NOTE — Telephone Encounter (Signed)
Rometta Emery, RN from Short Stay called to say that patient will need CXR and CT done within 7 days per [facility]

## 2015-03-28 NOTE — Progress Notes (Signed)
REVIEWED-NO ADDITIONAL RECOMMENDATIONS. 

## 2015-03-28 NOTE — Op Note (Signed)
[facility] [address] [place], [address]   ENDOSCOPY PROCEDURE REPORT  PATIENT: [name], [name]  MR#: [id number] BIRTHDATE: 15-Jun-1946 , 68  yrs. old GENDER: male  ENDOSCOPIST: [name], MD REFERRED BB:[id number] Hawkins, M.D. PROCEDURE DATE: 04/13/2015 PROCEDURE:   EGD w/ biopsy and EGD w/ wire guided (savary) dilation   INDICATIONS:dyspepsia.   dysphagia. USES IBUPROFEN. MEDICATIONS: Versed 2 mg IV and Demerol 25 mg IV TOPICAL ANESTHETIC:   Viscous Xylocaine ASA CLASS:  DESCRIPTION OF PROCEDURE:     Physical exam was performed.  Informed consent was obtained from the patient after explaining the benefits, risks, and alternatives to the procedure.  The patient was connected to the monitor and placed in the left lateral position.  Continuous oxygen was provided by nasal cannula and IV medicine administered through an indwelling cannula.  After administration of sedation, the patients esophagus was intubated and the [id number] ([id number])  endoscope was advanced under direct visualization to the second portion of the duodenum.  The scope was removed slowly by carefully examining the color, texture, anatomy, and integrity of the mucosa on the way out.  The patient was recovered in endoscopy and discharged home in satisfactory condition.   ESOPHAGUS: The mucosa of the esophagus appeared normal. EMPIRIC DILATION W/ UUEKCM(03-49 MM DILATORS) PERFORMED DUE TO POSSIBLE PROXIMAL ESOPHAGEAL WEB.  MINIMAL RESISTANCE. STOMACH: Mild non-erosive gastritis (inflammation) was found in the gastric antrum.  Multiple biopsies were performed using cold forceps. DUODENUM: The duodenal mucosa showed no abnormalities in the bulb and 2nd part of the duodenum. COMPLICATIONS: There were no immediate complications.  ENDOSCOPIC IMPRESSION: 1.  [place] 2.   MILD Non-erosive gastritis  RECOMMENDATIONS: LABS TODAY. COMPLETE YOUR CHEST X-RAY AND CT SCAN WITHIN THE NEXT 7  DAYS. SURGERY REFERRAL. CONTINUE OMEPRAZOLE 30 MINUTES PRIOR TO BREAKFAST. FOLLOW A SOFT MECHANICAL DIET.  AVOID ITEMS THAT CAUSE BLOATING. MEATS SHOULD BE CHOPPED OR GROUND ONLY.  DO NOT EAT CHUNKS OF ANYTHING. AWAIT BIOPSY. Follow up in 6 MOS. REPEAT colonoscopy AFTER NEXT OUTPATIENT VISIT. ALL FIRST DEGREE RELATIVES NEED TCS AT AGE 86 AND EVERY 5 YEARS. DISCUSSED WITH DR.  Luan Pulling.  REPEAT EXAM:    eSigned:  [name], MD 13-Apr-2015 3:07 PMrevised  CPT CODES: ICD CODES:  The ICD and CPT codes recommended by this software are interpretations from the data that the clinical staff has captured with the software.  The verification of the translation of this report to the ICD and CPT codes and modifiers is the sole responsibility of the health care institution and practicing physician where this report was generated.  [employer] will not be held responsible for the validity of the ICD and CPT codes included on this report.  AMA assumes no liability for data contained or not contained herein. CPT is a Designer, television/film set of the [employer].

## 2015-03-28 NOTE — Telephone Encounter (Signed)
Pts wife is aware 

## 2015-03-28 NOTE — Telephone Encounter (Signed)
Pt is set up for CT scan on 03/29/15 @ 3:45 pm. Left message for him to call back

## 2015-03-28 NOTE — Op Note (Signed)
[facility] [address] [place], [address]   COLONOSCOPY PROCEDURE REPORT  PATIENT: [name], [name]  MR#: [id number] BIRTHDATE: Feb 22, 1946 , 68  yrs. old GENDER: male ENDOSCOPIST: [name], MD REFERRED MO:[id number] Hawkins, M.D. PROCEDURE DATE:  04/16/15 PROCEDURE:   Colonoscopy with biopsy INDICATIONS:abdominal pain, hematochezia, and change in bowel habits. MEDICATIONS: Versed 5 mg IV and Demerol 50 mg IV  DESCRIPTION OF PROCEDURE:    Physical exam was performed.  Informed consent was obtained from the patient after explaining the benefits, risks, and alternatives to procedure.  The patient was connected to monitor and placed in left lateral position. Continuous oxygen was provided by nasal cannula and IV medicine administered through an indwelling cannula.  After administration of sedation and rectal exam, the patients rectum was intubated and the [id number] ([id number])  colonoscope was advanced under direct visualization to the [place].  The scope was removed slowly by carefully examining the color, texture, anatomy, and integrity mucosa on the way out.  The patient was recovered in endoscopy and discharged home in satisfactory condition.    COLON FINDINGS: A circumferential mass was found in the descending colon. NARROWING LUMEN TO 8-9 MM. UNABLE TO PASS ADULT COLONOSCOPE. Multiple biopsies were performed using cold forceps.  , There was mild diverticulosis noted in the sigmoid colon and descending colon with associated muscular hypertrophy.  , and Small external hemorrhoids were found.  PREP QUALITY: good.     COMPLICATIONS: None  ENDOSCOPIC IMPRESSION: 1.   ABDOMINAL PAIN, DIARRHEA, RECTAL BLEEDING DUE TO OBSTRUCTING COLON MASS 2.   Mild diverticulosis noted in the sigmoid colon and descending colon 3.   Small external hemorrhoids  RECOMMENDATIONS: AWAIT BIOPSY LABS TODAY CXR/CT ABD/PELVIS WITHIN 7 DAYS SURGERY  CONSULT   _______________________________ eSignedDanie Binder, MD Apr 16, 2015 2:55 PM    CPT CODES: ICD CODES:  The ICD and CPT codes recommended by this software are interpretations from the data that the clinical staff has captured with the software.  The verification of the translation of this report to the ICD and CPT codes and modifiers is the sole responsibility of the health care institution and practicing physician where this report was generated.  [employer] will not be held responsible for the validity of the ICD and CPT codes included on this report.  AMA assumes no liability for data contained or not contained herein. CPT is a Designer, television/film set of the [employer].

## 2015-03-29 ENCOUNTER — Ambulatory Visit (HOSPITAL_COMMUNITY)
Admission: RE | Admit: 2015-03-29 | Discharge: 2015-03-29 | Disposition: A | Discharge status: Home / Self Care | Payer: Medicare Other | Source: Ambulatory Visit | Attending: Gastroenterology | Admitting: Gastroenterology

## 2015-03-29 ENCOUNTER — Encounter (HOSPITAL_COMMUNITY): Payer: Self-pay | Admitting: Gastroenterology

## 2015-03-29 DIAGNOSIS — R1030 Lower abdominal pain, unspecified: Secondary | ICD-10-CM | POA: Insufficient documentation

## 2015-03-29 DIAGNOSIS — K6389 Other specified diseases of intestine: Secondary | ICD-10-CM

## 2015-03-29 DIAGNOSIS — C189 Malignant neoplasm of colon, unspecified: Secondary | ICD-10-CM | POA: Diagnosis not present

## 2015-03-29 LAB — CEA: CEA: 76.6 ng/mL — AB (ref 0.0–4.7)

## 2015-03-29 MED ORDER — IOHEXOL 300 MG/ML  SOLN
100.0000 mL | Freq: Once | INTRAMUSCULAR | Status: AC | PRN
  Administered 2015-03-29: 100 mL via INTRAVENOUS

## 2015-03-29 NOTE — Progress Notes (Signed)
Quick Note:  Cdiff negative. Patient had colon mass on TCS yesterday (colon cancer). Work up in progress. ______

## 2015-03-30 ENCOUNTER — Telehealth: Payer: Self-pay | Admitting: Gastroenterology

## 2015-03-30 NOTE — Progress Notes (Signed)
Quick Note:  Pt is aware. ______ 

## 2015-03-31 ENCOUNTER — Other Ambulatory Visit: Payer: Self-pay

## 2015-03-31 ENCOUNTER — Encounter (HOSPITAL_COMMUNITY)
Admission: RE | Admit: 2015-03-31 | Discharge: 2015-03-31 | Disposition: A | Discharge status: Home / Self Care | Payer: Medicare Other | Source: Ambulatory Visit | Attending: General Surgery | Admitting: General Surgery

## 2015-03-31 ENCOUNTER — Encounter (HOSPITAL_COMMUNITY): Payer: Self-pay

## 2015-03-31 DIAGNOSIS — Z01812 Encounter for preprocedural laboratory examination: Secondary | ICD-10-CM | POA: Insufficient documentation

## 2015-03-31 HISTORY — DX: Gastro-esophageal reflux disease without esophagitis: K21.9

## 2015-03-31 HISTORY — DX: Malignant (primary) neoplasm, unspecified: C80.1

## 2015-03-31 HISTORY — DX: Personal history of other diseases of the musculoskeletal system and connective tissue: Z87.39

## 2015-03-31 LAB — HEMOGLOBIN AND HEMATOCRIT, BLOOD
HCT: 42.1 % (ref 39.0–52.0)
HEMOGLOBIN: 14.6 g/dL (ref 13.0–17.0)

## 2015-03-31 LAB — TYPE AND SCREEN
ABO/RH(D): B NEG
ANTIBODY SCREEN: NEGATIVE

## 2015-03-31 NOTE — Pre-Procedure Instructions (Signed)
Dr Patsey Berthold and Dr Arnoldo Morale aware of EKG. NO further orders given.

## 2015-03-31 NOTE — Pre-Procedure Instructions (Signed)
Patient given information to sign up for my chart at home. 

## 2015-03-31 NOTE — H&P (Signed)
  NTS SOAP Note  Vital Signs:  Vitals as of: 03/20/2247: Systolic 250: Diastolic 99: Heart Rate 79: Temp 98.22F: Height 27ft 8in: Weight 300Lbs 0 Ounces: Pain Level 6: BMI 45.61  BMI : 45.61 kg/m2  Subjective: This 69 year old male presents fortreatment of a colon cancer.  Found on TCS for hematochezia.  Near obstructing in proximal sigmoid colon.  Mother had colon cancer.  CEA level elevated around 76.  CT scan shows no metastatic disease.  CXR negative.  Has been off eliquis for several weeks.  Review of Symptoms:  Constitutional:fatigue Head:unremarkable Eyes:unremarkable   Nose/Mouth/Throat:unremarkable Cardiovascular:  unremarkable Respiratory:unremarkable Gastrointestinabdominal pain Genitourinary:unremarkable   joint back Skin:unremarkable Hematolgic/Lymphatic:unremarkable   Allergic/Immunologic:unremarkable   Past Medical History:  Reviewed  Past Medical History  Surgical History: vein stripping Medical Problems: atrial fib,  HTN,  reflux Allergies: nkda Medications: lisnopril,  omeprazole   Social History:Reviewed  Social History  Preferred Language: English Race:  White Ethnicity: Not Hispanic / Latino Age: 55 year Marital Status:  M Alcohol: no   Smoking Status: Never smoker reviewed on 03/30/2015 Functional Status reviewed on 03/30/2015 ------------------------------------------------ Bathing: Normal Cooking: Normal Dressing: Normal Driving: Normal Eating: Normal Managing Meds: Normal Oral Care: Normal Shopping: Normal Toileting: Normal Transferring: Normal Walking: Normal Cognitive Status reviewed on 03/30/2015 ------------------------------------------------ Attention: Normal Decision Making: Normal Language: Normal Memory: Normal Motor: Normal Perception: Normal Problem Solving: Normal Visual and Spatial: Normal   Family History:Reviewed  Family Health History Mother, Deceased; Colon cancer;  Father, Deceased;  Lung cancer;     Objective Information: General:Well appearing, well nourished in no distress. Neck:Supple without lymphadenopathy.  Irregularly irregular rhythym Lungs:  CTA bilaterally, no wheezes, rhonchi, rales.  Breathing unlabored. Abdomen:Soft, NT/ND, normal bowel sounds, no HSM, no masses.  No peritoneal signs.  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  Trilyte,  neomycin,  and erythromycin prescribed.   Patient Education:Alternative treatments to surgery were discussed with patient (and family).  Risks and benefits  of procedure including bleeding,  infection,  cardiopulmonary difficulties,  anastomotic leak,  and the possibility of a blood transfusion were fully explained to the patient (and family) who gave informed consent. Patient/family questions were addressed.  Follow-up:Pending Surgery

## 2015-03-31 NOTE — Patient Instructions (Signed)
[name]  03/31/2015   Your procedure is scheduled on:  04/03/2015  Report to [facility] at  750  AM.  Call this number if you have problems the morning of surgery: [phone]   Remember:   Do not eat food or drink liquids after midnight.   Take these medicines the morning of surgery with A SIP OF WATER:  Lisinopril, prilosec   Do not wear jewelry, make-up or nail polish.  Do not wear lotions, powders, or perfumes.   Do not shave 48 hours prior to surgery. Men may shave face and neck.  Do not bring valuables to the hospital.  [facility] is not responsible for any belongings or valuables.               Contacts, dentures or bridgework may not be worn into surgery.  Leave suitcase in the car. After surgery it may be brought to your room.  For patients admitted to the hospital, discharge time is determined by your treatment team.               Patients discharged the day of surgery will not be allowed to drive home.  Name and phone number of your driver: family  Special Instructions: Shower using CHG 2 nights before surgery and the night before surgery.  If you shower the day of surgery use CHG.  Use special wash - you have one bottle of CHG for all showers.  You should use approximately 1/3 of the bottle for each shower.   Please read over the following fact sheets that you were given: Pain Booklet, Coughing and Deep Breathing, Blood Transfusion Information, Surgical Site Infection Prevention, Anesthesia Post-op Instructions and Care and Recovery After Surgery Laparoscopic Colectomy Laparoscopic colectomy is surgery to remove part or all of the large intestine (colon). This procedure is used to treat several conditions, including:  Inflammation and infection of the colon (diverticulitis).  Tumors or masses in the colon.  Inflammatory bowel disease, such as Crohn disease or ulcerative colitis. Colectomy is an option when symptoms cannot be controlled with  medicines.  Bleeding from the colon that cannot be controlled by another method.  Blockage or obstruction of the colon. LET [facility] CARE PROVIDER KNOW ABOUT:  Any allergies you have.  All medicines you are taking, including vitamins, herbs, eye drops, creams, and over-the-counter medicines.  Previous problems you or members of your family have had with the use of anesthetics.  Any blood disorders you have.  Previous surgeries you have had.  Medical conditions you have. RISKS AND COMPLICATIONS Generally, this is a safe procedure. However, as with any procedure, complications can occur. Possible complications include:  Infection.  Bleeding.  Damage to other organs.  Leaking from where the colon was sewn together.  Future blockage of the small intestines from scar tissue. Another surgery may be needed to repair this. In some cases, complications such as damage to other organs or excessive bleeding may require the surgeon to convert from a laparoscopic procedure to an open procedure. This involves making a larger incision in the abdomen to perform the procedure. BEFORE THE PROCEDURE  Ask your health care provider about changing or stopping any regular medicines.  You may be prescribed an oral bowel prep. This involves drinking a large amount of medicated liquid, starting the day before your surgery. The liquid will cause you to have multiple loose stools until your stool is almost clear or light green. This cleans  out your colon in preparation for the surgery.  Do not eat or drink anything else once you have started the bowel prep, unless your health care provider tells you it is safe to do so.  You may also be given antibiotic pills to clean out your colon of bacteria. Be sure to follow the directions carefully and take the medicine at the correct time. PROCEDURE   Small monitors will be put on your body. They are used to check your heart, blood pressure, and oxygen  level.  An IV access tube will be put into one of your veins. Medicine will be able to flow directly into your body through this IV tube.  You might be given a medicine to help you relax (sedative).  You will be given a medicine to make you sleep through the procedure (general anesthetic). A breathing tube may be placed into your lungs during the procedure.  A thin, flexible tube (catheter) will be placed into your bladder to collect urine.  A tube may be put in through your nose. It is called a nasogastric tube. It is used to remove stomach fluids after surgery until the intestines start working again.  Your abdomen will be filled with air so that it expands. This gives the surgeon more room to operate and makes your organs easier to see.  Several small cuts (incisions) are made in your abdomen.  A thin, lighted tube with a tiny camera on the end (laparoscope) is put through one of the small incisions. The camera on the laparoscope sends a picture to a TV screen in the operating room. This gives the surgeon a good view inside your abdomen.  Hollow tubes are put through the other small incisions in your abdomen. The tools needed for the procedure are put through these tubes.  Clamps or staples are put on both ends of the diseased part of the colon.  The part of the intestine between the clamps or staples is removed.  If possible, the ends of the healthy colon that remain will be stitched or stapled together to allow your body to expel waste (stool).  Sometimes, the remaining colon cannot be stitched back together. If this is the case, a colostomy is needed. For a colostomy:  An opening (stoma) to the outside of your body is made through the abdomen.  The end of the colon is brought to the opening. It is stitched to the skin.  A bag is attached to the opening. Stool will drain into this bag. The bag is removable.  The colostomy can be temporary or permanent.  The incisions from the  colectomy are closed with stitches or staples. AFTER THE PROCEDURE  You will be monitored closely in a recovery area until you are stable and doing well. You will then be moved to a regular hospital room.  You will need to receive fluids through an IV tube until your bowel function has returned. This may take 1-3 days. Once your bowels are working again, you will be started on clear liquids and then advanced to solid food as tolerated.  You will be given pain medicines to control your pain. Document Released: 03/08/2003 Document Revised: 10/06/2013 Document Reviewed: 07/28/2013 [facility] Patient Information 2015 [place], [place]. This information is not intended to replace advice given to you by your health care provider. Make sure you discuss any questions you have with your health care provider. PATIENT INSTRUCTIONS POST-ANESTHESIA  IMMEDIATELY FOLLOWING SURGERY:  Do not drive or operate machinery  for the first twenty four hours after surgery.  Do not make any important decisions for twenty four hours after surgery or while taking narcotic pain medications or sedatives.  If you develop intractable nausea and vomiting or a severe headache please notify your doctor immediately.  FOLLOW-UP:  Please make an appointment with your surgeon as instructed. You do not need to follow up with anesthesia unless specifically instructed to do so.  WOUND CARE INSTRUCTIONS (if applicable):  Keep a dry clean dressing on the anesthesia/puncture wound site if there is drainage.  Once the wound has quit draining you may leave it open to air.  Generally you should leave the bandage intact for twenty four hours unless there is drainage.  If the epidural site drains for more than 36-48 hours please call the anesthesia department.  QUESTIONS?:  Please feel free to call your physician or the hospital operator if you have any questions, and they will be happy to assist you.

## 2015-04-03 ENCOUNTER — Encounter (HOSPITAL_COMMUNITY): Payer: Self-pay | Admitting: *Deleted

## 2015-04-03 ENCOUNTER — Inpatient Hospital Stay (HOSPITAL_COMMUNITY)
Admission: RE | Admit: 2015-04-03 | Discharge: 2015-04-07 | DRG: 330 | Disposition: A | Discharge status: Home / Self Care | Payer: Medicare Other | Source: Ambulatory Visit | Attending: General Surgery | Admitting: General Surgery

## 2015-04-03 ENCOUNTER — Encounter (HOSPITAL_COMMUNITY)
Admission: RE | Disposition: A | Discharge status: Home / Self Care | Payer: Medicare Other | Source: Ambulatory Visit | Attending: General Surgery

## 2015-04-03 ENCOUNTER — Inpatient Hospital Stay (HOSPITAL_COMMUNITY): Payer: Medicare Other | Admitting: Anesthesiology

## 2015-04-03 DIAGNOSIS — C189 Malignant neoplasm of colon, unspecified: Secondary | ICD-10-CM | POA: Diagnosis present

## 2015-04-03 DIAGNOSIS — I482 Chronic atrial fibrillation: Secondary | ICD-10-CM | POA: Diagnosis present

## 2015-04-03 DIAGNOSIS — Z6841 Body Mass Index (BMI) 40.0 and over, adult: Secondary | ICD-10-CM | POA: Diagnosis not present

## 2015-04-03 DIAGNOSIS — I10 Essential (primary) hypertension: Secondary | ICD-10-CM | POA: Diagnosis present

## 2015-04-03 DIAGNOSIS — Z801 Family history of malignant neoplasm of trachea, bronchus and lung: Secondary | ICD-10-CM

## 2015-04-03 DIAGNOSIS — Z800 Family history of malignant neoplasm of digestive organs: Secondary | ICD-10-CM | POA: Diagnosis not present

## 2015-04-03 DIAGNOSIS — C187 Malignant neoplasm of sigmoid colon: Secondary | ICD-10-CM | POA: Diagnosis present

## 2015-04-03 DIAGNOSIS — Z7901 Long term (current) use of anticoagulants: Secondary | ICD-10-CM | POA: Diagnosis not present

## 2015-04-03 DIAGNOSIS — K219 Gastro-esophageal reflux disease without esophagitis: Secondary | ICD-10-CM | POA: Diagnosis present

## 2015-04-03 DIAGNOSIS — G4730 Sleep apnea, unspecified: Secondary | ICD-10-CM | POA: Diagnosis present

## 2015-04-03 HISTORY — PX: PARTIAL COLECTOMY: SHX5273

## 2015-04-03 SURGERY — COLECTOMY, PARTIAL
Anesthesia: General | Site: Abdomen

## 2015-04-03 MED ORDER — EPHEDRINE SULFATE 50 MG/ML IJ SOLN
INTRAMUSCULAR | Status: DC | PRN
Start: 2015-04-03 — End: 2015-04-03
  Administered 2015-04-03 (×2): 5 mg via INTRAVENOUS

## 2015-04-03 MED ORDER — CETYLPYRIDINIUM CHLORIDE 0.05 % MT LIQD
7.0000 mL | Freq: Two times a day (BID) | OROMUCOSAL | Status: DC
  Administered 2015-04-04 – 2015-04-07 (×7): 7 mL via OROMUCOSAL

## 2015-04-03 MED ORDER — ONDANSETRON HCL 4 MG PO TABS: 4.0000 mg | ORAL_TABLET | Freq: Four times a day (QID) | ORAL | Status: DC | PRN

## 2015-04-03 MED ORDER — LIDOCAINE HCL (CARDIAC) 20 MG/ML IV SOLN
INTRAVENOUS | Status: DC | PRN
  Administered 2015-04-03: 50 mg via INTRAVENOUS
  Administered 2015-04-03: 25 mg via INTRAVENOUS

## 2015-04-03 MED ORDER — POVIDONE-IODINE 10 % EX OINT
TOPICAL_OINTMENT | CUTANEOUS | Status: AC
  Filled 2015-04-03: qty 2

## 2015-04-03 MED ORDER — MIDAZOLAM HCL 2 MG/2ML IJ SOLN
INTRAMUSCULAR | Status: AC
  Filled 2015-04-03: qty 2

## 2015-04-03 MED ORDER — SODIUM CHLORIDE 0.9 % IR SOLN
Status: DC | PRN
  Administered 2015-04-03: 2000

## 2015-04-03 MED ORDER — HYDROMORPHONE HCL 1 MG/ML IJ SOLN
1.0000 mg | INTRAMUSCULAR | Status: DC | PRN
  Administered 2015-04-03 – 2015-04-04 (×3): 1 mg via INTRAVENOUS
  Filled 2015-04-03 (×3): qty 1

## 2015-04-03 MED ORDER — ARTIFICIAL TEARS OP OINT
TOPICAL_OINTMENT | OPHTHALMIC | Status: AC
  Filled 2015-04-03: qty 3.5

## 2015-04-03 MED ORDER — EPHEDRINE SULFATE 50 MG/ML IJ SOLN
INTRAMUSCULAR | Status: AC
  Filled 2015-04-03: qty 1

## 2015-04-03 MED ORDER — LACTATED RINGERS IV SOLN
INTRAVENOUS | Status: DC
  Administered 2015-04-03: 08:00:00 via INTRAVENOUS

## 2015-04-03 MED ORDER — CHLORHEXIDINE GLUCONATE 4 % EX LIQD: 1.0000 "application " | Freq: Once | CUTANEOUS | Status: DC

## 2015-04-03 MED ORDER — LIDOCAINE HCL (PF) 1 % IJ SOLN
INTRAMUSCULAR | Status: AC
  Filled 2015-04-03: qty 5

## 2015-04-03 MED ORDER — DEXAMETHASONE SODIUM PHOSPHATE 4 MG/ML IJ SOLN
INTRAMUSCULAR | Status: AC
  Filled 2015-04-03: qty 1

## 2015-04-03 MED ORDER — BUPIVACAINE LIPOSOME 1.3 % IJ SUSP
INTRAMUSCULAR | Status: AC
  Filled 2015-04-03: qty 20

## 2015-04-03 MED ORDER — KETOROLAC TROMETHAMINE 30 MG/ML IJ SOLN
30.0000 mg | Freq: Once | INTRAMUSCULAR | Status: AC
  Administered 2015-04-03: 30 mg via INTRAVENOUS
  Filled 2015-04-03: qty 1

## 2015-04-03 MED ORDER — ENOXAPARIN SODIUM 40 MG/0.4ML ~~LOC~~ SOLN
40.0000 mg | SUBCUTANEOUS | Status: DC
  Administered 2015-04-04: 40 mg via SUBCUTANEOUS
  Filled 2015-04-03: qty 0.4

## 2015-04-03 MED ORDER — ROCURONIUM BROMIDE 50 MG/5ML IV SOLN
INTRAVENOUS | Status: AC
  Filled 2015-04-03: qty 1

## 2015-04-03 MED ORDER — GLYCOPYRROLATE 0.2 MG/ML IJ SOLN
INTRAMUSCULAR | Status: AC
  Filled 2015-04-03: qty 3

## 2015-04-03 MED ORDER — DEXAMETHASONE SODIUM PHOSPHATE 4 MG/ML IJ SOLN
4.0000 mg | Freq: Once | INTRAMUSCULAR | Status: AC
  Administered 2015-04-03: 4 mg via INTRAVENOUS

## 2015-04-03 MED ORDER — LACTATED RINGERS IV SOLN
INTRAVENOUS | Status: DC
  Administered 2015-04-03 – 2015-04-04 (×3): via INTRAVENOUS

## 2015-04-03 MED ORDER — PHENYLEPHRINE 40 MCG/ML (10ML) SYRINGE FOR IV PUSH (FOR BLOOD PRESSURE SUPPORT)
PREFILLED_SYRINGE | INTRAVENOUS | Status: AC
  Filled 2015-04-03: qty 10

## 2015-04-03 MED ORDER — LISINOPRIL 10 MG PO TABS
10.0000 mg | ORAL_TABLET | Freq: Every day | ORAL | Status: DC
  Administered 2015-04-04 – 2015-04-07 (×4): 10 mg via ORAL
  Filled 2015-04-03 (×4): qty 1

## 2015-04-03 MED ORDER — BUPIVACAINE HCL (PF) 0.5 % IJ SOLN
INTRAMUSCULAR | Status: AC
  Filled 2015-04-03: qty 30

## 2015-04-03 MED ORDER — OXYCODONE-ACETAMINOPHEN 5-325 MG PO TABS
1.0000 | ORAL_TABLET | ORAL | Status: DC | PRN
  Administered 2015-04-03 – 2015-04-07 (×10): 2 via ORAL
  Filled 2015-04-03 (×10): qty 2

## 2015-04-03 MED ORDER — ROCURONIUM BROMIDE 100 MG/10ML IV SOLN
INTRAVENOUS | Status: DC | PRN
  Administered 2015-04-03: 10 mg via INTRAVENOUS
  Administered 2015-04-03: 30 mg via INTRAVENOUS
  Administered 2015-04-03: 10 mg via INTRAVENOUS

## 2015-04-03 MED ORDER — ALVIMOPAN 12 MG PO CAPS
12.0000 mg | ORAL_CAPSULE | Freq: Once | ORAL | Status: AC
  Administered 2015-04-03: 12 mg via ORAL

## 2015-04-03 MED ORDER — ALVIMOPAN 12 MG PO CAPS
ORAL_CAPSULE | ORAL | Status: AC
  Filled 2015-04-03: qty 1

## 2015-04-03 MED ORDER — SODIUM CHLORIDE 0.9 % IJ SOLN
INTRAMUSCULAR | Status: AC
  Filled 2015-04-03: qty 10

## 2015-04-03 MED ORDER — ACETAMINOPHEN 325 MG PO TABS: 650.0000 mg | ORAL_TABLET | ORAL | Status: DC | PRN

## 2015-04-03 MED ORDER — MIDAZOLAM HCL 2 MG/2ML IJ SOLN
1.0000 mg | INTRAMUSCULAR | Status: DC | PRN
Start: 2015-04-03 — End: 2015-04-03
  Administered 2015-04-03: 2 mg via INTRAVENOUS

## 2015-04-03 MED ORDER — NEOSTIGMINE METHYLSULFATE 10 MG/10ML IV SOLN
INTRAVENOUS | Status: DC | PRN
  Administered 2015-04-03: 4 mg via INTRAVENOUS

## 2015-04-03 MED ORDER — FENTANYL CITRATE 0.05 MG/ML IJ SOLN
INTRAMUSCULAR | Status: AC
  Filled 2015-04-03: qty 5

## 2015-04-03 MED ORDER — SUCCINYLCHOLINE CHLORIDE 20 MG/ML IJ SOLN
INTRAMUSCULAR | Status: AC
  Filled 2015-04-03: qty 1

## 2015-04-03 MED ORDER — ONDANSETRON HCL 4 MG/2ML IJ SOLN
4.0000 mg | Freq: Once | INTRAMUSCULAR | Status: AC
  Administered 2015-04-03: 4 mg via INTRAVENOUS

## 2015-04-03 MED ORDER — FENTANYL CITRATE 0.05 MG/ML IJ SOLN
INTRAMUSCULAR | Status: DC | PRN
  Administered 2015-04-03 (×6): 50 ug via INTRAVENOUS

## 2015-04-03 MED ORDER — ONDANSETRON HCL 4 MG/2ML IJ SOLN: 4.0000 mg | Freq: Four times a day (QID) | INTRAMUSCULAR | Status: DC | PRN

## 2015-04-03 MED ORDER — DEXAMETHASONE SODIUM PHOSPHATE 4 MG/ML IJ SOLN
INTRAMUSCULAR | Status: DC | PRN
  Administered 2015-04-03: 4 mg via INTRAVENOUS

## 2015-04-03 MED ORDER — PROPOFOL 10 MG/ML IV BOLUS
INTRAVENOUS | Status: AC
  Filled 2015-04-03: qty 20

## 2015-04-03 MED ORDER — CEFOTETAN DISODIUM-DEXTROSE 2-2.08 GM-% IV SOLR
2.0000 g | Freq: Two times a day (BID) | INTRAVENOUS | Status: DC
  Administered 2015-04-03: 2 g via INTRAVENOUS

## 2015-04-03 MED ORDER — ENOXAPARIN SODIUM 40 MG/0.4ML ~~LOC~~ SOLN
SUBCUTANEOUS | Status: AC
  Filled 2015-04-03: qty 0.4

## 2015-04-03 MED ORDER — ONDANSETRON HCL 4 MG/2ML IJ SOLN: 4.0000 mg | Freq: Once | INTRAMUSCULAR | Status: DC | PRN

## 2015-04-03 MED ORDER — NEOSTIGMINE METHYLSULFATE 10 MG/10ML IV SOLN
INTRAVENOUS | Status: AC
  Filled 2015-04-03: qty 1

## 2015-04-03 MED ORDER — CEFOTETAN DISODIUM-DEXTROSE 2-2.08 GM-% IV SOLR
INTRAVENOUS | Status: AC
  Filled 2015-04-03: qty 50

## 2015-04-03 MED ORDER — POVIDONE-IODINE 10 % OINT PACKET
TOPICAL_OINTMENT | CUTANEOUS | Status: DC | PRN
  Administered 2015-04-03: 2 via TOPICAL

## 2015-04-03 MED ORDER — ENOXAPARIN SODIUM 40 MG/0.4ML ~~LOC~~ SOLN
40.0000 mg | Freq: Once | SUBCUTANEOUS | Status: AC
  Administered 2015-04-03: 40 mg via SUBCUTANEOUS

## 2015-04-03 MED ORDER — FENTANYL CITRATE 0.05 MG/ML IJ SOLN
25.0000 ug | INTRAMUSCULAR | Status: DC | PRN
  Administered 2015-04-03 (×2): 50 ug via INTRAVENOUS
  Filled 2015-04-03: qty 2

## 2015-04-03 MED ORDER — LACTATED RINGERS IV SOLN
INTRAVENOUS | Status: DC | PRN
  Administered 2015-04-03 (×3): via INTRAVENOUS

## 2015-04-03 MED ORDER — ONDANSETRON HCL 4 MG/2ML IJ SOLN
INTRAMUSCULAR | Status: AC
  Filled 2015-04-03: qty 2

## 2015-04-03 MED ORDER — PROPOFOL 10 MG/ML IV BOLUS
INTRAVENOUS | Status: DC | PRN
  Administered 2015-04-03: 110 mg via INTRAVENOUS

## 2015-04-03 MED ORDER — BUPIVACAINE LIPOSOME 1.3 % IJ SUSP
INTRAMUSCULAR | Status: DC | PRN
  Administered 2015-04-03: 20 mL

## 2015-04-03 MED ORDER — GLYCOPYRROLATE 0.2 MG/ML IJ SOLN
INTRAMUSCULAR | Status: DC | PRN
  Administered 2015-04-03: 0.6 mg via INTRAVENOUS

## 2015-04-03 MED ORDER — ALVIMOPAN 12 MG PO CAPS
12.0000 mg | ORAL_CAPSULE | Freq: Two times a day (BID) | ORAL | Status: DC
  Administered 2015-04-04 – 2015-04-05 (×4): 12 mg via ORAL
  Filled 2015-04-03 (×4): qty 1

## 2015-04-03 MED ORDER — PHENYLEPHRINE HCL 10 MG/ML IJ SOLN
INTRAMUSCULAR | Status: DC | PRN
  Administered 2015-04-03: 40 ug via INTRAVENOUS
  Administered 2015-04-03: 80 ug via INTRAVENOUS
  Administered 2015-04-03: 40 ug via INTRAVENOUS
  Administered 2015-04-03: 80 ug via INTRAVENOUS
  Administered 2015-04-03: 40 ug via INTRAVENOUS

## 2015-04-03 MED ORDER — SUCCINYLCHOLINE CHLORIDE 20 MG/ML IJ SOLN
INTRAMUSCULAR | Status: DC | PRN
  Administered 2015-04-03: 120 mg via INTRAVENOUS

## 2015-04-03 SURGICAL SUPPLY — 79 items
BAG HAMPER (MISCELLANEOUS) ×4 IMPLANT
BLADE 10 SAFETY STRL DISP (BLADE) IMPLANT
CHLORAPREP W/TINT 26ML (MISCELLANEOUS) ×4 IMPLANT
CLOTH BEACON ORANGE TIMEOUT ST (SAFETY) ×4 IMPLANT
COVER LIGHT HANDLE STERIS (MISCELLANEOUS) ×8 IMPLANT
COVER MAYO STAND XLG (DRAPE) ×8 IMPLANT
CUTTER ENDO LINEAR 45M (STAPLE) IMPLANT
DECANTER SPIKE VIAL GLASS SM (MISCELLANEOUS) ×4 IMPLANT
DRAPE INCISE IOBAN 44X35 STRL (DRAPES) ×4 IMPLANT
DRAPE UTILITY W/TAPE 26X15 (DRAPES) ×8 IMPLANT
DRAPE WARM FLUID 44X44 (DRAPE) ×4 IMPLANT
DRSG OPSITE POSTOP 4X10 (GAUZE/BANDAGES/DRESSINGS) ×4 IMPLANT
ELECT BLADE 6 FLAT ULTRCLN (ELECTRODE) IMPLANT
ELECT BLADE TIP CTD 4 INCH (ELECTRODE) ×4 IMPLANT
ELECT REM PT RETURN 9FT ADLT (ELECTROSURGICAL) ×4
ELECTRODE REM PT RTRN 9FT ADLT (ELECTROSURGICAL) ×2 IMPLANT
FILTER SMOKE EVAC LAPAROSHD (FILTER) ×4 IMPLANT
GAUZE SPONGE 4X4 12PLY STRL (GAUZE/BANDAGES/DRESSINGS) IMPLANT
GLOVE ECLIPSE 6.5 STRL STRAW (GLOVE) ×8 IMPLANT
GLOVE INDICATOR 7.0 STRL GRN (GLOVE) ×12 IMPLANT
GLOVE SS BIOGEL STRL SZ 6.5 (GLOVE) ×4 IMPLANT
GLOVE SUPERSENSE BIOGEL SZ 6.5 (GLOVE) ×4
GLOVE SURG SS PI 7.5 STRL IVOR (GLOVE) ×8 IMPLANT
GOWN STRL REUS W/ TWL XL LVL3 (GOWN DISPOSABLE) ×4 IMPLANT
GOWN STRL REUS W/TWL LRG LVL3 (GOWN DISPOSABLE) ×16 IMPLANT
GOWN STRL REUS W/TWL XL LVL3 (GOWN DISPOSABLE) ×4
INST SET LAPROSCOPIC AP (KITS) ×4 IMPLANT
INST SET MAJOR GENERAL (KITS) ×4 IMPLANT
IV NS IRRIG 3000ML ARTHROMATIC (IV SOLUTION) IMPLANT
KIT ROOM TURNOVER AP CYSTO (KITS) IMPLANT
LIGASURE 5MM LAPAROSCOPIC (INSTRUMENTS) IMPLANT
LIGASURE IMPACT 36 18CM CVD LR (INSTRUMENTS) ×4 IMPLANT
LIGASURE LAP ATLAS 10MM 37CM (INSTRUMENTS) IMPLANT
MANIFOLD NEPTUNE II (INSTRUMENTS) ×4 IMPLANT
NEEDLE HYPO 18GX1.5 BLUNT FILL (NEEDLE) ×4 IMPLANT
NEEDLE HYPO 21X1.5 SAFETY (NEEDLE) ×4 IMPLANT
NS IRRIG 1000ML POUR BTL (IV SOLUTION) ×8 IMPLANT
PACK LAP CHOLE LZT030E (CUSTOM PROCEDURE TRAY) ×4 IMPLANT
PAD ARMBOARD 7.5X6 YLW CONV (MISCELLANEOUS) ×4 IMPLANT
PENCIL HANDSWITCHING (ELECTRODE) ×8 IMPLANT
RELOAD LINEAR CUT PROX 55 BLUE (ENDOMECHANICALS) IMPLANT
RELOAD PROXIMATE 75MM BLUE (ENDOMECHANICALS) ×8 IMPLANT
RELOAD STAPLE TA45 3.5 REG BLU (ENDOMECHANICALS) IMPLANT
RETRACTOR WOUND ALXS 34CM XLRG (MISCELLANEOUS) ×2 IMPLANT
RTRCTR WOUND ALEXIS 34CM XLRG (MISCELLANEOUS) ×4
SEALER TISSUE G2 CVD JAW 35 (ENDOMECHANICALS) IMPLANT
SEALER TISSUE G2 CVD JAW 45CM (ENDOMECHANICALS)
SET BASIN LINEN APH (SET/KITS/TRAYS/PACK) ×4 IMPLANT
SET TUBE IRRIG SUCTION NO TIP (IRRIGATION / IRRIGATOR) IMPLANT
SHEET LAVH (DRAPES) ×4 IMPLANT
SPONGE GAUZE 2X2 8PLY STER LF (GAUZE/BANDAGES/DRESSINGS) ×2
SPONGE GAUZE 2X2 8PLY STRL LF (GAUZE/BANDAGES/DRESSINGS) ×6 IMPLANT
SPONGE LAP 18X18 X RAY DECT (DISPOSABLE) ×8 IMPLANT
STAPLER GUN LINEAR PROX 60 (STAPLE) ×8 IMPLANT
STAPLER PROXIMATE 55 BLUE (STAPLE) IMPLANT
STAPLER PROXIMATE 75MM BLUE (STAPLE) ×4 IMPLANT
STAPLER VISISTAT (STAPLE) ×4 IMPLANT
SUCTION POOLE TIP (SUCTIONS) ×8 IMPLANT
SUT CHROMIC 0 CT 1 (SUTURE) ×4 IMPLANT
SUT CHROMIC 2 0 SH (SUTURE) IMPLANT
SUT CHROMIC 3 0 SH 27 (SUTURE) IMPLANT
SUT NOVA NAB GS-26 0 60 (SUTURE) ×8 IMPLANT
SUT PDS AB CT VIOLET #0 27IN (SUTURE) IMPLANT
SUT SILK 2 0 (SUTURE)
SUT SILK 2-0 18XBRD TIE 12 (SUTURE) IMPLANT
SUT SILK 3 0 SH CR/8 (SUTURE) ×4 IMPLANT
SUT VIC AB 0 CT1 27 (SUTURE)
SUT VIC AB 0 CT1 27XCR 8 STRN (SUTURE) IMPLANT
SUT VIC AB 2-0 CT2 27 (SUTURE) ×4 IMPLANT
SYR 20CC LL (SYRINGE) ×4 IMPLANT
SYS LAPSCP GELPORT 120MM (MISCELLANEOUS) ×4
SYSTEM LAPSCP GELPORT 120MM (MISCELLANEOUS) ×2 IMPLANT
TRAY FOLEY CATH 16FR SILVER (SET/KITS/TRAYS/PACK) ×4 IMPLANT
TROCAR ENDO BLADELESS 11MM (ENDOMECHANICALS) ×4 IMPLANT
TROCAR XCEL UNIV SLVE 11M 100M (ENDOMECHANICALS) ×4 IMPLANT
TUBING INSUFFLATION (TUBING) ×4 IMPLANT
WARMER LAPAROSCOPE (MISCELLANEOUS) ×4 IMPLANT
YANKAUER SUCT BULB TIP 10FT TU (MISCELLANEOUS) ×8 IMPLANT
YANKAUER SUCT BULB TIP NO VENT (SUCTIONS) ×8 IMPLANT

## 2015-04-03 NOTE — Anesthesia Procedure Notes (Signed)
Procedure Name: Intubation Date/Time: 04/03/2015 8:49 AM Performed by: Andree Elk, Lina Hitch A Pre-anesthesia Checklist: Patient identified, Patient being monitored, Timeout performed, Emergency Drugs available and Suction available Patient Re-evaluated:Patient Re-evaluated prior to inductionOxygen Delivery Method: Circle System Utilized Preoxygenation: Pre-oxygenation with 100% oxygen Intubation Type: IV induction, Rapid sequence and Cricoid Pressure applied Laryngoscope Size: Glidescope and 4 Grade View: Grade I Tube type: Oral Tube size: 7.0 mm Number of attempts: 1 Airway Equipment and Method: Stylet Placement Confirmation: ETT inserted through vocal cords under direct vision,  positive ETCO2 and breath sounds checked- equal and bilateral Secured at: 21 cm Tube secured with: Tape Dental Injury: Teeth and Oropharynx as per pre-operative assessment  Comments: Had good view with glidescope,however some difficulty passing ETT through cords; no trauma noted; additional decadron 4 mg IV given; VSS

## 2015-04-03 NOTE — Anesthesia Preprocedure Evaluation (Signed)
Anesthesia Evaluation  Patient identified by MRN, date of birth, ID band Patient awake    Reviewed: Allergy & Precautions, NPO status , Patient's Chart, lab work & pertinent test results  Airway Mallampati: III  TM Distance: >3 FB     Dental  (+) Poor Dentition, Missing, Loose, Chipped, Dental Advisory Given   Pulmonary sleep apnea ,  breath sounds clear to auscultation        Cardiovascular hypertension, Pt. on medications + Peripheral Vascular Disease + dysrhythmias Atrial Fibrillation Rhythm:Regular Rate:Normal     Neuro/Psych    GI/Hepatic GERD-  Medicated,  Endo/Other  Morbid obesity  Renal/GU      Musculoskeletal   Abdominal   Peds  Hematology   Anesthesia Other Findings   Reproductive/Obstetrics                             Anesthesia Physical Anesthesia Plan  ASA: III  Anesthesia Plan: General   Post-op Pain Management:    Induction: Intravenous, Rapid sequence and Cricoid pressure planned  Airway Management Planned: Oral ETT and Video Laryngoscope Planned  Additional Equipment:   Intra-op Plan:   Post-operative Plan: Extubation in OR  Informed Consent: I have reviewed the patients History and Physical, chart, labs and discussed the procedure including the risks, benefits and alternatives for the proposed anesthesia with the patient or authorized representative who has indicated his/her understanding and acceptance.     Plan Discussed with:   Anesthesia Plan Comments:         Anesthesia Quick Evaluation

## 2015-04-03 NOTE — Anesthesia Postprocedure Evaluation (Signed)
  Anesthesia Post-op Note  Patient: [name]  Procedure(s) Performed: Procedure(s): PARTIAL COLECTOMY (N/A)  Patient Location: PACU  Anesthesia Type:General  Level of Consciousness: awake, alert , oriented and patient cooperative  Airway and Oxygen Therapy: Patient Spontanous Breathing and Patient connected to nasal cannula oxygen  Post-op Pain: mild  Post-op Assessment: Post-op Vital signs reviewed, Patient's Cardiovascular Status Stable, Patent Airway, No signs of Nausea or vomiting and Pain level controlled  Post-op Vital Signs: Reviewed and stable  Last Vitals:  Filed Vitals:   04/03/15 1130  BP: 144/59  Pulse: 76  Temp:   Resp: 15    Complications: No apparent anesthesia complications

## 2015-04-03 NOTE — Op Note (Signed)
Patient:  [name]  DOB:  June 18, 1946  MRN:  [id number]   Preop Diagnosis:  Colon carcinoma  Postop Diagnosis:  Same  Procedure:  Partial colectomy with splenic flexure takedown  Surgeon:  Aviva Signs, M.D.  Anes:  Gen. endotracheal  Indications:  Patient is a 69 year old white male who was found on colonoscopy to have a near obstructing lesion in the proximal sigmoid colon. The patient's preoperative CEA level was also noted to be elevated. The patient now comes to the operating room for a laparoscopic hand-assisted partial colectomy, possible open colectomy. The risks and benefits of the procedure including bleeding, infection, cardiopulmonary difficulties, anastomotic leak, and the possibility of a blood transfusion were fully explained to the patient, who gave informed consent.  Procedure note:  The patient was placed the supine position. After induction of general endotracheal anesthesia, we attempted to place the patient in low lithotomy position. I felt that after positioning the patient this way, it was more beneficial for him to undergo an open colectomy. The patient was placed in the supine position and the abdomen was prepped and draped using usual sterile technique with ChloraPrep. Surgical site confirmation was performed.  A midline incision was made from above the umbilicus to below the umbilicus. Peritoneal cavity was entered into without difficulty. The liver was inspected and noted to be within normal limits. The descending colon, transverse colon, and proximal descending colon regions were palpated and no mass lesions were noted. The patient had a 5 cm elongated abnormal mass involving the proximal sigmoid colon. The rest of the sigmoid colon and rectum are within normal limits. The descending colon was freed away from its peritoneal reflection using Bovie electrocautery. The splenic flexure was taken down using the LigaSure. A GIA stapler was placed along the distal  sigmoid colon and fired. This was likewise done to an area along the midportion of the descending colon. The patient had an elongated and tortuous sigmoid colon. The mesentery was then divided using the LigaSure. Care was taken to avoid the left ureter. A suture was placed proximally on the sigmoid colon specimen for orientation purposes. He was sent to pathology further examination. A side to side colocolostomy was then performed using a GIA-75 stapler. The colotomy was closed using a TA 60 stapler. The staple line was bolstered using 3-0 silk sutures. Surrounding omentum was placed over the anastomosis. The abdominal cavity was copiously irrigated normal saline. All operating personnel then changed gowns and labs. A new surgical setup was used.  The fascia was reapproximated using a looped 0 Novafil running suture. The subcutaneous layer was irrigated normal saline. Subcutaneous layer was reapproximated using 2-0 Vicryl interrupted sutures. Exparel was instilled the surrounding wound. The skin was closed using staples. Betadine ointment dry sterile dressings were applied.  All tape and needle counts were correct the end of the procedure. The patient was extubated in the operating room and transferred to PACU in stable condition.  Complications:  None  EBL:  50 mL  Specimen:  Sigmoid colon, suture proximal

## 2015-04-03 NOTE — Transfer of Care (Signed)
Immediate Anesthesia Transfer of Care Note  Patient: [name]  Procedure(s) Performed: Procedure(s): PARTIAL COLECTOMY (N/A)  Patient Location: PACU  Anesthesia Type:General  Level of Consciousness: sedated and patient cooperative  Airway & Oxygen Therapy: Patient Spontanous Breathing and Patient connected to face mask oxygen  Post-op Assessment: Report given to RN and Post -op Vital signs reviewed and stable  Post vital signs: Reviewed and stable  Last Vitals:  Filed Vitals:   04/03/15 1040  BP: 146/84  Pulse: 82  Temp:   Resp: 15    Complications: No apparent anesthesia complications

## 2015-04-03 NOTE — Interval H&P Note (Signed)
History and Physical Interval Note:  04/03/2015 8:16 AM  [name]  has presented today for surgery, with the diagnosis of colon cancer  The various methods of treatment have been discussed with the patient and family. After consideration of risks, benefits and other options for treatment, the patient has consented to  Procedure(s): HAND ASSISTED LAPAROSCOPIC PARTIAL COLECTOMY WITH TAKEDOWN (N/A) as a surgical intervention .  The patient's history has been reviewed, patient examined, no change in status, stable for surgery.  I have reviewed the patient's chart and labs.  Questions were answered to the patient's satisfaction.     Aviva Signs A

## 2015-04-04 LAB — CBC
HCT: 38.6 % — ABNORMAL LOW (ref 39.0–52.0)
HEMOGLOBIN: 12.9 g/dL — AB (ref 13.0–17.0)
MCH: 32.3 pg (ref 26.0–34.0)
MCHC: 33.4 g/dL (ref 30.0–36.0)
MCV: 96.7 fL (ref 78.0–100.0)
PLATELETS: 147 10*3/uL — AB (ref 150–400)
RBC: 3.99 MIL/uL — ABNORMAL LOW (ref 4.22–5.81)
RDW: 13.3 % (ref 11.5–15.5)
WBC: 20.1 10*3/uL — ABNORMAL HIGH (ref 4.0–10.5)

## 2015-04-04 LAB — BASIC METABOLIC PANEL
ANION GAP: 5 (ref 5–15)
BUN: 16 mg/dL (ref 6–23)
CO2: 28 mmol/L (ref 19–32)
CREATININE: 1.09 mg/dL (ref 0.50–1.35)
Calcium: 8.6 mg/dL (ref 8.4–10.5)
Chloride: 102 mmol/L (ref 96–112)
GFR calc Af Amer: 79 mL/min — ABNORMAL LOW (ref >90)
GFR calc non Af Amer: 68 mL/min — ABNORMAL LOW (ref >90)
Glucose, Bld: 126 mg/dL — ABNORMAL HIGH (ref 70–99)
Potassium: 4.8 mmol/L (ref 3.5–5.1)
Sodium: 135 mmol/L (ref 135–145)

## 2015-04-04 LAB — MAGNESIUM: Magnesium: 1.8 mg/dL (ref 1.5–2.5)

## 2015-04-04 LAB — PHOSPHORUS: Phosphorus: 4.4 mg/dL (ref 2.3–4.6)

## 2015-04-04 MED ORDER — ENOXAPARIN SODIUM 80 MG/0.8ML ~~LOC~~ SOLN
70.0000 mg | SUBCUTANEOUS | Status: DC
  Administered 2015-04-05 – 2015-04-07 (×3): 70 mg via SUBCUTANEOUS
  Filled 2015-04-04 (×3): qty 0.8

## 2015-04-04 MED ORDER — KCL IN DEXTROSE-NACL 20-5-0.45 MEQ/L-%-% IV SOLN
INTRAVENOUS | Status: DC
  Administered 2015-04-04 – 2015-04-06 (×5): via INTRAVENOUS

## 2015-04-04 MED FILL — Sodium Chloride Irrigation Soln 0.9%: Qty: 2000 | Status: AC

## 2015-04-04 NOTE — Progress Notes (Signed)
Subjective: I am seeing him for medical follow-up along with Dr. Arnoldo Morale who has done his surgery. This is a 69 year old who has recently developed atrial fibrillation and then developed heme positive stools while he was on anticoagulation. He underwent colonoscopy and was found to have a near obstructing colon cancer and this was resected yesterday. His hypertension and morbid obesity. He has recently had a sleep study.  Objective: Vital signs in last 24 hours: Temp:  [97.7 F (36.5 C)-98.6 F (37 C)] 97.8 F (36.6 C) (04/05 0654) Pulse Rate:  [64-83] 70 (04/05 0654) Resp:  [15-27] 20 (04/05 0654) BP: (99-166)/(53-84) 135/70 mmHg (04/05 0654) SpO2:  [93 %-100 %] 100 % (04/05 0654) Weight change:  Last BM Date: 04/03/15  Intake/Output from previous day: 04/04 0701 - 04/05 0700 In: 4331.7 [P.O.:360; I.V.:3971.7] Out: 650 [Urine:600; Blood:50]  PHYSICAL EXAM General appearance: alert, cooperative, no distress and morbidly obese Resp: clear to auscultation bilaterally Cardio: irregularly irregular rhythm GI: Per Dr. Arnoldo Morale Extremities: extremities normal, atraumatic, no cyanosis or edema  Lab Results:  Results for orders placed or performed during the hospital encounter of 04/03/15 (from the past 48 hour(s))  Basic metabolic panel     Status: Abnormal   Collection Time: 04/04/15  6:28 AM  Result Value Ref Range   Sodium 135 135 - 145 mmol/L   Potassium 4.8 3.5 - 5.1 mmol/L   Chloride 102 96 - 112 mmol/L   CO2 28 19 - 32 mmol/L   Glucose, Bld 126 (H) 70 - 99 mg/dL   BUN 16 6 - 23 mg/dL   Creatinine, Ser 1.09 0.50 - 1.35 mg/dL   Calcium 8.6 8.4 - 10.5 mg/dL   GFR calc non Af Amer 68 (L) >90 mL/min   GFR calc Af Amer 79 (L) >90 mL/min    Comment: (NOTE) The eGFR has been calculated using the CKD EPI equation. This calculation has not been validated in all clinical situations. eGFR's persistently <90 mL/min signify possible Chronic Kidney Disease.    Anion gap 5 5 - 15   Magnesium     Status: None   Collection Time: 04/04/15  6:28 AM  Result Value Ref Range   Magnesium 1.8 1.5 - 2.5 mg/dL  Phosphorus     Status: None   Collection Time: 04/04/15  6:28 AM  Result Value Ref Range   Phosphorus 4.4 2.3 - 4.6 mg/dL  CBC     Status: Abnormal   Collection Time: 04/04/15  6:28 AM  Result Value Ref Range   WBC 20.1 (H) 4.0 - 10.5 K/uL   RBC 3.99 (L) 4.22 - 5.81 MIL/uL   Hemoglobin 12.9 (L) 13.0 - 17.0 g/dL   HCT 38.6 (L) 39.0 - 52.0 %   MCV 96.7 78.0 - 100.0 fL   MCH 32.3 26.0 - 34.0 pg   MCHC 33.4 30.0 - 36.0 g/dL   RDW 13.3 11.5 - 15.5 %   Platelets 147 (L) 150 - 400 K/uL    ABGS No results for input(s): PHART, PO2ART, TCO2, HCO3 in the last 72 hours.  Invalid input(s): PCO2 CULTURES No results found for this or any previous visit (from the past 240 hour(s)). Studies/Results: No results found.  Medications:  Prior to Admission:  Prescriptions prior to admission  Medication Sig Dispense Refill Last Dose  . ibuprofen (ADVIL,MOTRIN) 200 MG tablet Take 400 mg by mouth every 6 (six) hours as needed for mild pain or moderate pain (for knee pain).   Past Week at Unknown  time  . lisinopril (PRINIVIL,ZESTRIL) 10 MG tablet Take 1 tablet (10 mg total) by mouth daily. 30 tablet 12 04/03/2015 at 0645  . omeprazole (PRILOSEC) 20 MG capsule Take 1 capsule (20 mg total) by mouth daily. 30 capsule 3 04/03/2015 at 0645   Scheduled: . alvimopan  12 mg Oral BID  . antiseptic oral rinse  7 mL Mouth Rinse BID  . enoxaparin (LOVENOX) injection  40 mg Subcutaneous Q24H  . lisinopril  10 mg Oral Daily   Continuous: . dextrose 5 % and 0.45 % NaCl with KCl 20 mEq/L     FVC:[id number], HYDROmorphone (DILAUDID) injection, ondansetron **OR** ondansetron (ZOFRAN) IV, oxyCODONE-acetaminophen  Assesment: He has colon cancer which has been resected. He has hypertension which is pretty well controlled. He had a sleep study and apparently has sleep apnea but is not on CPAP  yet. He has atrial fibrillation and has been anticoagulated. Active Problems:   Colon carcinoma    Plan: Continue current treatments I will obtain the report on his sleep study.    LOS: 1 day   Tristen Luce L 04/04/2015, 8:40 AM

## 2015-04-04 NOTE — Progress Notes (Signed)
UR chart review completed.  

## 2015-04-04 NOTE — Care Management Note (Addendum)
    Page 1 of 2   04/07/2015     10:50:39 AM CARE MANAGEMENT NOTE 04/07/2015  Patient:  [name], [name]   Account Number:  [id number]  Date Initiated:  04/04/2015  Documentation initiated by:  Theophilus Kinds  Subjective/Objective Assessment:   Pt admitted from home s/p colectomy due to colon cancer. Pt lives with his wife and will return home at discharge. Pt is independent with ADL's.     Action/Plan:   Pt will need cpap at discharge and would like to obtain through [facility]. Will continue to follow for discharge planning needs.   Anticipated DC Date:  04/07/2015   Anticipated DC Plan:  [place]  CM consult      PAC Choice  DURABLE MEDICAL EQUIPMENT   Choice offered to / List presented to:  C-1 Patient   DME arranged  CPAP      DME agency  [place].        Status of service:  Completed, signed off Medicare Important Message given?  YES (If response is "NO", the following Medicare IM given date fields will be blank) Date Medicare IM given:  04/07/2015 Medicare IM given by:  Theophilus Kinds Date Additional Medicare IM given:   Additional Medicare IM given by:    Discharge Disposition:  HOME/SELF CARE  Per UR Regulation:    If discussed at Long Length of Stay Meetings, dates discussed:    Comments:  04/07/15 [place], RN BSN CM Pt discharged home today. Pt does not qualify for home O2. Pts cpap arranged with [facility] and will be delivered to pts home at discharge. Pts first choice, [place] is unable to provide cpap due to insurance restrictions. Pt and pts nurse aware of discharge arrangements.  04/04/15 [place], RN BSN CM

## 2015-04-04 NOTE — Addendum Note (Signed)
Addendum  created 04/04/15 1127 by Mickel Baas, CRNA   Modules edited: Charges VN

## 2015-04-04 NOTE — Addendum Note (Signed)
Addendum  created 04/04/15 1129 by Mickel Baas, CRNA   Modules edited: Anesthesia Medication Administration

## 2015-04-04 NOTE — Addendum Note (Signed)
Addendum  created 04/04/15 1629 by Mickel Baas, CRNA   Modules edited: Notes Section   Notes Section:  File: [id number]

## 2015-04-04 NOTE — Anesthesia Postprocedure Evaluation (Signed)
  Anesthesia Post-op Note  Patient: [name]  Procedure(s) Performed: Procedure(s): PARTIAL COLECTOMY (N/A)  Patient Location: Room 331  Anesthesia Type:General  Level of Consciousness: awake, alert , oriented and patient cooperative  Airway and Oxygen Therapy: Patient Spontanous Breathing  Post-op Pain: mild  Post-op Assessment: Post-op Vital signs reviewed, Patient's Cardiovascular Status Stable, Respiratory Function Stable, Patent Airway, No signs of Nausea or vomiting and Pain level controlled  Post-op Vital Signs: Reviewed and stable  Last Vitals:  Filed Vitals:   04/04/15 1440  BP: 99/64  Pulse: 53  Temp: 36.7 C  Resp: 20    Complications: No apparent anesthesia complications

## 2015-04-04 NOTE — Progress Notes (Signed)
1 Day Post-Op  Subjective: No significant incisional pain. No shortness of breath.  Objective: Vital signs in last 24 hours: Temp:  [97.4 F (36.3 C)-98.6 F (37 C)] 97.8 F (36.6 C) (04/05 0654) Pulse Rate:  [64-91] 70 (04/05 0654) Resp:  [15-27] 20 (04/05 0654) BP: (99-166)/(53-84) 135/70 mmHg (04/05 0654) SpO2:  [88 %-100 %] 100 % (04/05 0654) Weight:  [139.708 kg (308 lb)] 139.708 kg (308 lb) (04/04 0817) Last BM Date: 04/03/15  Intake/Output from previous day: 04/04 0701 - 04/05 0700 In: 4331.7 [P.O.:360; I.V.:3971.7] Out: 650 [Urine:600; Blood:50] Intake/Output this shift:    General appearance: alert, cooperative and no distress Resp: clear to auscultation bilaterally Cardio: Irregularly irregular GI: Soft. Dressing dry and intact.  Lab Results:   Recent Labs  04/04/15 0628  WBC 20.1*  HGB 12.9*  HCT 38.6*  PLT 147*   BMET  Recent Labs  04/04/15 0628  NA 135  K 4.8  CL 102  CO2 28  GLUCOSE 126*  BUN 16  CREATININE 1.09  CALCIUM 8.6   PT/INR No results for input(s): LABPROT, INR in the last 72 hours.  Studies/Results: No results found.  Anti-infectives: Anti-infectives    Start     Dose/Rate Route Frequency Ordered Stop   04/03/15 0759  cefoTEtan in Dextrose 5% (CEFOTAN) IVPB 2 g  Status:  Discontinued     2 g Intravenous Every 12 hours 04/03/15 0759 04/03/15 1202      Assessment/Plan: s/p Procedure(s): PARTIAL COLECTOMY Impression: Stable on postoperative day 1. Elevated white blood cell count reactive in nature. Hemoglobin stable.  Plan: We'll get patient up in chair. Awaiting return of bowel function. Continue IV fluids.  LOS: 1 day    Riann Oman A 04/04/2015

## 2015-04-05 ENCOUNTER — Encounter (HOSPITAL_COMMUNITY): Payer: Self-pay | Admitting: General Surgery

## 2015-04-05 LAB — BASIC METABOLIC PANEL
Anion gap: 3 — ABNORMAL LOW (ref 5–15)
BUN: 14 mg/dL (ref 6–23)
CALCIUM: 8.9 mg/dL (ref 8.4–10.5)
CO2: 33 mmol/L — ABNORMAL HIGH (ref 19–32)
Chloride: 102 mmol/L (ref 96–112)
Creatinine, Ser: 1 mg/dL (ref 0.50–1.35)
GFR calc Af Amer: 87 mL/min — ABNORMAL LOW (ref >90)
GFR, EST NON AFRICAN AMERICAN: 75 mL/min — AB (ref >90)
GLUCOSE: 103 mg/dL — AB (ref 70–99)
POTASSIUM: 4.8 mmol/L (ref 3.5–5.1)
SODIUM: 138 mmol/L (ref 135–145)

## 2015-04-05 LAB — CBC
HCT: 39 % (ref 39.0–52.0)
Hemoglobin: 12.7 g/dL — ABNORMAL LOW (ref 13.0–17.0)
MCH: 31.9 pg (ref 26.0–34.0)
MCHC: 32.6 g/dL (ref 30.0–36.0)
MCV: 98 fL (ref 78.0–100.0)
PLATELETS: 150 10*3/uL (ref 150–400)
RBC: 3.98 MIL/uL — AB (ref 4.22–5.81)
RDW: 13.4 % (ref 11.5–15.5)
WBC: 10.7 10*3/uL — ABNORMAL HIGH (ref 4.0–10.5)

## 2015-04-05 LAB — PHOSPHORUS: Phosphorus: 2.9 mg/dL (ref 2.3–4.6)

## 2015-04-05 LAB — MAGNESIUM: Magnesium: 1.9 mg/dL (ref 1.5–2.5)

## 2015-04-05 NOTE — Progress Notes (Signed)
2 Days Post-Op  Subjective: Doing well. No complaints of pain. No bowel movement or flatus yet.  Objective: Vital signs in last 24 hours: Temp:  [97.8 F (36.6 C)-98.3 F (36.8 C)] 98.1 F (36.7 C) (04/06 0616) Pulse Rate:  [53-77] 67 (04/06 0616) Resp:  [17-20] 20 (04/06 0616) BP: (99-105)/(62-64) 105/62 mmHg (04/06 0616) SpO2:  [93 %-100 %] 100 % (04/06 0616) Last BM Date: 04/03/15  Intake/Output from previous day: 04/05 0701 - 04/06 0700 In: 2606.7 [P.O.:720; I.V.:1886.7] Out: 3500 [Urine:3500] Intake/Output this shift:    General appearance: alert, cooperative and no distress Resp: clear to auscultation bilaterally Cardio: irregularly irregular rhythm GI: Soft. Dressing dry and intact. Minimal bowel sounds appreciated.  Lab Results:   Recent Labs  04/04/15 0628 04/05/15 0717  WBC 20.1* 10.7*  HGB 12.9* 12.7*  HCT 38.6* 39.0  PLT 147* 150   BMET  Recent Labs  04/04/15 0628 04/05/15 0717  NA 135 138  K 4.8 4.8  CL 102 102  CO2 28 33*  GLUCOSE 126* 103*  BUN 16 14  CREATININE 1.09 1.00  CALCIUM 8.6 8.9   PT/INR No results for input(s): LABPROT, INR in the last 72 hours.  Studies/Results: No results found.  Anti-infectives: Anti-infectives    Start     Dose/Rate Route Frequency Ordered Stop   04/03/15 0759  cefoTEtan in Dextrose 5% (CEFOTAN) IVPB 2 g  Status:  Discontinued     2 g Intravenous Every 12 hours 04/03/15 0759 04/03/15 1202      Assessment/Plan: s/p Procedure(s): PARTIAL COLECTOMY Impression: Stable on postoperative day 2. Foley has been removed. We'll advance to full liquid diet. Awaiting return of bowel function.  LOS: 2 days    Berta Denson A 04/05/2015

## 2015-04-05 NOTE — Progress Notes (Signed)
Subjective: He says he feels okay. He wore C Pap for about 6 hours last night. He is not having any flatus yet. He has not had a bowel movement. He still has some incisional pain.  Objective: Vital signs in last 24 hours: Temp:  [97.8 F (36.6 C)-98.3 F (36.8 C)] 98.1 F (36.7 C) (04/06 0616) Pulse Rate:  [53-77] 67 (04/06 0616) Resp:  [17-20] 20 (04/06 0616) BP: (99-105)/(62-64) 105/62 mmHg (04/06 0616) SpO2:  [93 %-100 %] 100 % (04/06 0616) Weight change:  Last BM Date: 04/03/15  Intake/Output from previous day: 04/05 0701 - 04/06 0700 In: 2606.7 [P.O.:720; I.V.:1886.7] Out: 3500 [Urine:3500]  PHYSICAL EXAM General appearance: alert, cooperative and no distress Resp: clear to auscultation bilaterally Cardio: irregularly irregular rhythm GI: Per Dr. Arnoldo Morale Extremities: extremities normal, atraumatic, no cyanosis or edema  Lab Results:  Results for orders placed or performed during the hospital encounter of 04/03/15 (from the past 48 hour(s))  Basic metabolic panel     Status: Abnormal   Collection Time: 04/04/15  6:28 AM  Result Value Ref Range   Sodium 135 135 - 145 mmol/L   Potassium 4.8 3.5 - 5.1 mmol/L   Chloride 102 96 - 112 mmol/L   CO2 28 19 - 32 mmol/L   Glucose, Bld 126 (H) 70 - 99 mg/dL   BUN 16 6 - 23 mg/dL   Creatinine, Ser 1.09 0.50 - 1.35 mg/dL   Calcium 8.6 8.4 - 10.5 mg/dL   GFR calc non Af Amer 68 (L) >90 mL/min   GFR calc Af Amer 79 (L) >90 mL/min    Comment: (NOTE) The eGFR has been calculated using the CKD EPI equation. This calculation has not been validated in all clinical situations. eGFR's persistently <90 mL/min signify possible Chronic Kidney Disease.    Anion gap 5 5 - 15  Magnesium     Status: None   Collection Time: 04/04/15  6:28 AM  Result Value Ref Range   Magnesium 1.8 1.5 - 2.5 mg/dL  Phosphorus     Status: None   Collection Time: 04/04/15  6:28 AM  Result Value Ref Range   Phosphorus 4.4 2.3 - 4.6 mg/dL  CBC     Status:  Abnormal   Collection Time: 04/04/15  6:28 AM  Result Value Ref Range   WBC 20.1 (H) 4.0 - 10.5 K/uL   RBC 3.99 (L) 4.22 - 5.81 MIL/uL   Hemoglobin 12.9 (L) 13.0 - 17.0 g/dL   HCT 38.6 (L) 39.0 - 52.0 %   MCV 96.7 78.0 - 100.0 fL   MCH 32.3 26.0 - 34.0 pg   MCHC 33.4 30.0 - 36.0 g/dL   RDW 13.3 11.5 - 15.5 %   Platelets 147 (L) 150 - 400 K/uL  CBC     Status: Abnormal   Collection Time: 04/05/15  7:17 AM  Result Value Ref Range   WBC 10.7 (H) 4.0 - 10.5 K/uL   RBC 3.98 (L) 4.22 - 5.81 MIL/uL   Hemoglobin 12.7 (L) 13.0 - 17.0 g/dL   HCT 39.0 39.0 - 52.0 %   MCV 98.0 78.0 - 100.0 fL   MCH 31.9 26.0 - 34.0 pg   MCHC 32.6 30.0 - 36.0 g/dL   RDW 13.4 11.5 - 15.5 %   Platelets 150 150 - 400 K/uL  Basic metabolic panel     Status: Abnormal   Collection Time: 04/05/15  7:17 AM  Result Value Ref Range   Sodium 138 135 - 145  mmol/L   Potassium 4.8 3.5 - 5.1 mmol/L   Chloride 102 96 - 112 mmol/L   CO2 33 (H) 19 - 32 mmol/L   Glucose, Bld 103 (H) 70 - 99 mg/dL   BUN 14 6 - 23 mg/dL   Creatinine, Ser 1.00 0.50 - 1.35 mg/dL   Calcium 8.9 8.4 - 10.5 mg/dL   GFR calc non Af Amer 75 (L) >90 mL/min   GFR calc Af Amer 87 (L) >90 mL/min    Comment: (NOTE) The eGFR has been calculated using the CKD EPI equation. This calculation has not been validated in all clinical situations. eGFR's persistently <90 mL/min signify possible Chronic Kidney Disease.    Anion gap 3 (L) 5 - 15  Phosphorus     Status: None   Collection Time: 04/05/15  7:17 AM  Result Value Ref Range   Phosphorus 2.9 2.3 - 4.6 mg/dL  Magnesium     Status: None   Collection Time: 04/05/15  7:17 AM  Result Value Ref Range   Magnesium 1.9 1.5 - 2.5 mg/dL    ABGS No results for input(s): PHART, PO2ART, TCO2, HCO3 in the last 72 hours.  Invalid input(s): PCO2 CULTURES No results found for this or any previous visit (from the past 240 hour(s)). Studies/Results: No results found.  Medications:  Prior to Admission:   Prescriptions prior to admission  Medication Sig Dispense Refill Last Dose  . ibuprofen (ADVIL,MOTRIN) 200 MG tablet Take 400 mg by mouth every 6 (six) hours as needed for mild pain or moderate pain (for knee pain).   Past Week at Unknown time  . lisinopril (PRINIVIL,ZESTRIL) 10 MG tablet Take 1 tablet (10 mg total) by mouth daily. 30 tablet 12 04/03/2015 at 0645  . omeprazole (PRILOSEC) 20 MG capsule Take 1 capsule (20 mg total) by mouth daily. 30 capsule 3 04/03/2015 at 0645   Scheduled: . alvimopan  12 mg Oral BID  . antiseptic oral rinse  7 mL Mouth Rinse BID  . enoxaparin (LOVENOX) injection  70 mg Subcutaneous Q24H  . lisinopril  10 mg Oral Daily   Continuous: . dextrose 5 % and 0.45 % NaCl with KCl 20 mEq/L 100 mL/hr at 04/05/15 0507   [id number], HYDROmorphone (DILAUDID) injection, ondansetron **OR** ondansetron (ZOFRAN) IV, oxyCODONE-acetaminophen  Assesment: He was admitted with colon cancer and that has been resected. Pathology is still pending. He has chronic atrial fibrillation which is stable. He has sleep apnea and has started on Cipro. He has hypertension which is pretty well controlled. Active Problems:   Colon carcinoma    Plan: Continue current treatments. He will restart his oral anticoagulation later.    LOS: 2 days   Akeela Busk L 04/05/2015, 8:29 AM

## 2015-04-06 NOTE — Telephone Encounter (Signed)
PLEASE CALL PT. WE WISH HIM A SPEEDY RECOVERY. HE NEEDS COMPLETE TCS IN 6 mos.

## 2015-04-06 NOTE — Progress Notes (Signed)
3 Days Post-Op  Subjective: Had small bowel movement yesterday. Tolerating full liquid diet well.  Objective: Vital signs in last 24 hours: Temp:  [97.7 F (36.5 C)-98.2 F (36.8 C)] 98 F (36.7 C) (04/07 0520) Pulse Rate:  [64-86] 81 (04/07 0520) Resp:  [18-20] 18 (04/07 0520) BP: (100-158)/(56-92) 158/92 mmHg (04/07 0520) SpO2:  [96 %-99 %] 99 % (04/07 0520) Last BM Date: 04/03/15  Intake/Output from previous day: 04/06 0701 - 04/07 0700 In: 2095 [P.O.:720; I.V.:1375] Out: 3325 [ZHYQM:5784] Intake/Output this shift:    General appearance: alert, cooperative and no distress GI: Soft. Active bowel sounds appreciated. Incision healing well.  Lab Results:   Recent Labs  04/04/15 0628 04/05/15 0717  WBC 20.1* 10.7*  HGB 12.9* 12.7*  HCT 38.6* 39.0  PLT 147* 150   BMET  Recent Labs  04/04/15 0628 04/05/15 0717  NA 135 138  K 4.8 4.8  CL 102 102  CO2 28 33*  GLUCOSE 126* 103*  BUN 16 14  CREATININE 1.09 1.00  CALCIUM 8.6 8.9   PT/INR No results for input(s): LABPROT, INR in the last 72 hours.  Studies/Results: No results found.  Anti-infectives: Anti-infectives    Start     Dose/Rate Route Frequency Ordered Stop   04/03/15 0759  cefoTEtan in Dextrose 5% (CEFOTAN) IVPB 2 g  Status:  Discontinued     2 g Intravenous Every 12 hours 04/03/15 0759 04/03/15 1202      Assessment/Plan: s/p Procedure(s): PARTIAL COLECTOMY Impression: Bowel function is returning. Final pathology reveals a T3, N0, M0 adenocarcinoma of the colon. Patient has been made aware of the results. We'll advance to soft diet. Anticipate discharge in next 24-48 hours.  LOS: 3 days    Tonea Leiphart A 04/06/2015

## 2015-04-06 NOTE — Telephone Encounter (Signed)
Noted and pt is aware.

## 2015-04-06 NOTE — Telephone Encounter (Signed)
Reminder in epic °

## 2015-04-06 NOTE — Progress Notes (Signed)
Subjective: He says he feels okay. He used to see that began last night for about 4 hours. He still has some incisional pain. He had a bowel movement today but it was essentially totally blood.  Objective: Vital signs in last 24 hours: Temp:  [97.7 F (36.5 C)-98.2 F (36.8 C)] 98 F (36.7 C) (04/07 0520) Pulse Rate:  [64-86] 81 (04/07 0520) Resp:  [18-20] 18 (04/07 0520) BP: (100-158)/(56-92) 158/92 mmHg (04/07 0520) SpO2:  [96 %-99 %] 99 % (04/07 0520) Weight change:  Last BM Date: 04/03/15  Intake/Output from previous day: 04/06 0701 - 04/07 0700 In: 2095 [P.O.:720; I.V.:1375] Out: 3325 [Urine:3325]  PHYSICAL EXAM General appearance: alert, cooperative, no distress and morbidly obese Resp: clear to auscultation bilaterally Cardio: irregularly irregular rhythm GI: soft, non-tender; bowel sounds normal; no masses,  no organomegaly Extremities: extremities normal, atraumatic, no cyanosis or edema  Lab Results:  Results for orders placed or performed during the hospital encounter of 04/03/15 (from the past 48 hour(s))  CBC     Status: Abnormal   Collection Time: 04/05/15  7:17 AM  Result Value Ref Range   WBC 10.7 (H) 4.0 - 10.5 K/uL   RBC 3.98 (L) 4.22 - 5.81 MIL/uL   Hemoglobin 12.7 (L) 13.0 - 17.0 g/dL   HCT 39.0 39.0 - 52.0 %   MCV 98.0 78.0 - 100.0 fL   MCH 31.9 26.0 - 34.0 pg   MCHC 32.6 30.0 - 36.0 g/dL   RDW 13.4 11.5 - 15.5 %   Platelets 150 150 - 400 K/uL  Basic metabolic panel     Status: Abnormal   Collection Time: 04/05/15  7:17 AM  Result Value Ref Range   Sodium 138 135 - 145 mmol/L   Potassium 4.8 3.5 - 5.1 mmol/L   Chloride 102 96 - 112 mmol/L   CO2 33 (H) 19 - 32 mmol/L   Glucose, Bld 103 (H) 70 - 99 mg/dL   BUN 14 6 - 23 mg/dL   Creatinine, Ser 1.00 0.50 - 1.35 mg/dL   Calcium 8.9 8.4 - 10.5 mg/dL   GFR calc non Af Amer 75 (L) >90 mL/min   GFR calc Af Amer 87 (L) >90 mL/min    Comment: (NOTE) The eGFR has been calculated using the CKD EPI  equation. This calculation has not been validated in all clinical situations. eGFR's persistently <90 mL/min signify possible Chronic Kidney Disease.    Anion gap 3 (L) 5 - 15  Phosphorus     Status: None   Collection Time: 04/05/15  7:17 AM  Result Value Ref Range   Phosphorus 2.9 2.3 - 4.6 mg/dL  Magnesium     Status: None   Collection Time: 04/05/15  7:17 AM  Result Value Ref Range   Magnesium 1.9 1.5 - 2.5 mg/dL    ABGS No results for input(s): PHART, PO2ART, TCO2, HCO3 in the last 72 hours.  Invalid input(s): PCO2 CULTURES No results found for this or any previous visit (from the past 240 hour(s)). Studies/Results: No results found.  Medications:  Prior to Admission:  Prescriptions prior to admission  Medication Sig Dispense Refill Last Dose  . ibuprofen (ADVIL,MOTRIN) 200 MG tablet Take 400 mg by mouth every 6 (six) hours as needed for mild pain or moderate pain (for knee pain).   Past Week at Unknown time  . lisinopril (PRINIVIL,ZESTRIL) 10 MG tablet Take 1 tablet (10 mg total) by mouth daily. 30 tablet 12 04/03/2015 at 0645  . omeprazole (  PRILOSEC) 20 MG capsule Take 1 capsule (20 mg total) by mouth daily. 30 capsule 3 04/03/2015 at 0645   Scheduled: . alvimopan  12 mg Oral BID  . antiseptic oral rinse  7 mL Mouth Rinse BID  . enoxaparin (LOVENOX) injection  70 mg Subcutaneous Q24H  . lisinopril  10 mg Oral Daily   Continuous: . dextrose 5 % and 0.45 % NaCl with KCl 20 mEq/L 50 mL/hr at 04/05/15 1922   [id number]:[id number], HYDROmorphone (DILAUDID) injection, ondansetron **OR** ondansetron (ZOFRAN) IV, oxyCODONE-acetaminophen  Assesment: He was admitted with colon carcinoma and has had a resection. We are awaiting return of bowel function. He had a bowel movement today but it was mostly blood which is to be expected after his surgery. He has sleep apnea and is adjusting to using CPAP. He has hypertension which is well-controlled on current treatments. He has atrial  fibrillation and will need to be restarted on his anticoagulant once he is no longer at risk of hemorrhage from his surgery Active Problems:   Colon carcinoma    Plan: Continue current treatments    LOS: 3 days   [name] L 04/06/2015, 8:46 AM

## 2015-04-07 MED ORDER — HYDROCODONE-ACETAMINOPHEN 5-325 MG PO TABS: 1.0000 | ORAL_TABLET | Freq: Four times a day (QID) | ORAL | Status: DC | PRN

## 2015-04-07 NOTE — Progress Notes (Signed)
Subjective: He says he feels okay. He had a more normal bowel movement. He is still working trying to wear C Pap.  Objective: Vital signs in last 24 hours: Temp:  [97.6 F (36.4 C)-98.2 F (36.8 C)] 97.6 F (36.4 C) (04/08 0700) Pulse Rate:  [79-88] 79 (04/08 0700) Resp:  [18-20] 20 (04/08 0700) BP: (141-159)/(72-83) 141/79 mmHg (04/08 0700) SpO2:  [96 %-99 %] 99 % (04/08 0700) Weight change:  Last BM Date: 04/06/15  Intake/Output from previous day: 04/07 0701 - 04/08 0700 In: 1204.2 [P.O.:800; I.V.:404.2] Out: 1600 [Urine:1600]  PHYSICAL EXAM General appearance: alert, cooperative, no distress and morbidly obese Resp: clear to auscultation bilaterally Cardio: regular rate and rhythm, S1, S2 normal, no murmur, click, rub or gallop GI: soft, non-tender; bowel sounds normal; no masses,  no organomegaly Extremities: extremities normal, atraumatic, no cyanosis or edema  Lab Results:  No results found for this or any previous visit (from the past 48 hour(s)).  ABGS No results for input(s): PHART, PO2ART, TCO2, HCO3 in the last 72 hours.  Invalid input(s): PCO2 CULTURES No results found for this or any previous visit (from the past 240 hour(s)). Studies/Results: No results found.  Medications:  Prior to Admission:  Prescriptions prior to admission  Medication Sig Dispense Refill Last Dose  . ibuprofen (ADVIL,MOTRIN) 200 MG tablet Take 400 mg by mouth every 6 (six) hours as needed for mild pain or moderate pain (for knee pain).   Past Week at Unknown time  . lisinopril (PRINIVIL,ZESTRIL) 10 MG tablet Take 1 tablet (10 mg total) by mouth daily. 30 tablet 12 04/03/2015 at 0645  . omeprazole (PRILOSEC) 20 MG capsule Take 1 capsule (20 mg total) by mouth daily. 30 capsule 3 04/03/2015 at 0645   Scheduled: . antiseptic oral rinse  7 mL Mouth Rinse BID  . enoxaparin (LOVENOX) injection  70 mg Subcutaneous Q24H  . lisinopril  10 mg Oral Daily   Continuous: . dextrose 5 % and 0.45  % NaCl with KCl 20 mEq/L 10 mL/hr at 04/06/15 2009   [id number], HYDROmorphone (DILAUDID) injection, ondansetron **OR** ondansetron (ZOFRAN) IV, oxyCODONE-acetaminophen  Assesment: He had colon cancer and had resection. He has sleep apnea and is starting to use C Pap and is adjusting to it. He said some trouble with his oxygen level dropping so he may need home oxygen. He has hypertension which is pretty well controlled. He has atrial fibrillation and will be started back on anticoagulation per Dr. Arnoldo Morale. Active Problems:   Colon carcinoma    Plan: Continue current treatments. I will be out of town over the weekend but returned on the 11th.    LOS: 4 days   Nitin Mckowen L 04/07/2015, 8:54 AM

## 2015-04-07 NOTE — Progress Notes (Signed)
SATURATION QUALIFICATIONS: (This note is used to comply with regulatory documentation for home oxygen)  Patient Saturations on Room Air at Rest = 98%  Patient Saturations on Room Air while Ambulating = 95%   

## 2015-04-07 NOTE — Discharge Summary (Signed)
Physician Discharge Summary  [name] MRN: [id number] DOB/AGE: 69-Jan-1947 69 y.o.  Admit date: 04/03/2015 Discharge date: 04/07/2015  Admission Diagnoses: Colon carcinoma  Discharge Diagnoses: Same Active Problems:   Colon carcinoma   Discharged Condition: good  Hospital Course: Patient is a 69 year old white male who was found on recent colonoscopy to have a near obstructing colon carcinoma. He underwent a partial colectomy on 04/03/2015. Tolerated the procedure well. His postoperative course has been unremarkable. His diet was advanced without difficulty. Final pathology revealed a T3, N0, M0 adenocarcinoma. He did have an elevated preoperative CEA. He is being discharged home on postoperative day 4 in good and improving condition.  Treatments: surgery: Partial colectomy on 04/03/2015  Discharge Exam: Blood pressure 141/79, pulse 79, temperature 97.6 F (36.4 C), temperature source Oral, resp. rate 20, height 5\' 8"  (1.727 m), weight 139.708 kg (308 lb), SpO2 99 %. General appearance: alert, cooperative and no distress Resp: clear to auscultation bilaterally Cardio: regular rate and rhythm, S1, S2 normal, no murmur, click, rub or gallop GI: Soft, incision healing well.  Disposition: 01-Home or Self Care     Medication List    TAKE these medications        HYDROcodone-acetaminophen 5-325 MG per tablet  Commonly known as:  NORCO/VICODIN  Take 1-2 tablets by mouth every 6 (six) hours as needed for moderate pain.     ibuprofen 200 MG tablet  Commonly known as:  ADVIL,MOTRIN  Take 400 mg by mouth every 6 (six) hours as needed for mild pain or moderate pain (for knee pain).     lisinopril 10 MG tablet  Commonly known as:  PRINIVIL,ZESTRIL  Take 1 tablet (10 mg total) by mouth daily.     omeprazole 20 MG capsule  Commonly known as:  PRILOSEC  Take 1 capsule (20 mg total) by mouth daily.           Follow-up Information    Follow up with  Jamesetta So, MD. Schedule an appointment as soon as possible for a visit on 04/13/2015.   Specialty:  General Surgery   Contact information:   [id number] [address] [phone]       Signed: Aviva Signs A 04/07/2015, 9:27 AM

## 2015-04-07 NOTE — Progress Notes (Signed)
UR chart review completed.  

## 2015-04-07 NOTE — Discharge Instructions (Signed)
Open Colectomy, Care After Refer to this sheet in the next few weeks. These instructions provide you with information on caring for yourself after your procedure. Your health care provider may also give you more specific instructions. Your treatment has been planned according to current medical practices, but problems sometimes occur. Call your health care provider if you have any problems or questions after your procedure. WHAT TO EXPECT AFTER THE PROCEDURE After your procedure, it is typical to have the following:  Pain in your abdomen, especially along your incision. You will be given medicines to control the pain.  Tiredness. This is a normal part of the recovery process. Your energy level will return to normal over the next several weeks.  Constipation. You may be given a stool softener to prevent this. HOME CARE INSTRUCTIONS  Only take over-the-counter or prescription medicines as directed by your health care provider.  Ask your health care provider whether you may take a shower when you go home.  You may resume a normal diet and activities as directed. Eat plenty of fruits and vegetables to help prevent constipation.  Drink enough fluids to keep your urine clear or pale yellow. This also helps prevent constipation.  Take rest breaks during the day as needed.  Avoid lifting anything heavier than 25 pounds (11.3 kg) or driving for 4 weeks or until your health care provider says it is okay.  Follow up with your health care provider as directed. Ask your health care provider when you need to return to have your stitches or staples removed. SEEK MEDICAL CARE IF:  You have redness, swelling, or increasing pain in the incision area.  You see pus coming from the incision area.  You have a fever. SEEK IMMEDIATE MEDICAL CARE IF:   You have chest pain or shortness of breath.  You have pain or swelling in your legs.  You have persistent nausea and vomiting.  Your wound breaks open  after stitches or staples have been removed.  You have increasing abdominal pain that is not relieved with medicine. Document Released: 07/09/2011 Document Revised: 10/06/2013 Document Reviewed: 07/28/2013 [facility] Patient Information 2015 [place], [place]. This information is not intended to replace advice given to you by your health care provider. Make sure you discuss any questions you have with your health care provider.

## 2015-04-07 NOTE — Progress Notes (Signed)
Patient has worn the CPAP for about two hours tonight;this has been his pattern for the last few nights. He complained that the air was blowing in his eye and a list of of other problems not related to the CPAP unit was interfering with his sleep. Patient went to bathroom and was placed on  2lpm [place] with an extension for the remainder of the evening.Patient is being monitored by a pulse to track his O2 saturations.

## 2015-04-07 NOTE — Progress Notes (Signed)
Darrick Penna Bowker discharged home with wife per MD order.  Discharge instructions reviewed and discussed with the patient and wife at bedside, all questions and concerns answered. Copy of instructions and scripts given to patient.    Medication List    TAKE these medications        HYDROcodone-acetaminophen 5-325 MG per tablet  Commonly known as:  NORCO/VICODIN  Take 1-2 tablets by mouth every 6 (six) hours as needed for moderate pain.     ibuprofen 200 MG tablet  Commonly known as:  ADVIL,MOTRIN  Take 400 mg by mouth every 6 (six) hours as needed for mild pain or moderate pain (for knee pain).     lisinopril 10 MG tablet  Commonly known as:  PRINIVIL,ZESTRIL  Take 1 tablet (10 mg total) by mouth daily.     omeprazole 20 MG capsule  Commonly known as:  PRILOSEC  Take 1 capsule (20 mg total) by mouth daily.        Patients abdominal incision was clean, dry, and intact and had no signs of infections. IV site discontinued and catheter remains intact. Site without signs and symptoms of complications. Dressing and pressure applied.  Patient escorted to car by Lovena Le, RN in a wheelchair,  no distress noted upon discharge.  Regino Bellow 04/07/2015 11:06 AM

## 2015-04-11 ENCOUNTER — Encounter (HOSPITAL_COMMUNITY): Payer: Self-pay

## 2015-04-27 ENCOUNTER — Encounter (HOSPITAL_COMMUNITY): Payer: Medicare Other | Attending: Hematology & Oncology | Admitting: Hematology & Oncology

## 2015-04-27 VITALS — BP 100/57 | HR 88 | Temp 97.6°F | Resp 20 | Ht 65.75 in | Wt 281.2 lb

## 2015-04-27 DIAGNOSIS — C186 Malignant neoplasm of descending colon: Secondary | ICD-10-CM

## 2015-04-27 DIAGNOSIS — C187 Malignant neoplasm of sigmoid colon: Secondary | ICD-10-CM | POA: Insufficient documentation

## 2015-04-27 DIAGNOSIS — R195 Other fecal abnormalities: Secondary | ICD-10-CM | POA: Diagnosis not present

## 2015-04-27 DIAGNOSIS — I4891 Unspecified atrial fibrillation: Secondary | ICD-10-CM

## 2015-04-27 LAB — CBC WITH DIFFERENTIAL/PLATELET
BASOS PCT: 0 % (ref 0–1)
Basophils Absolute: 0 10*3/uL (ref 0.0–0.1)
Eosinophils Absolute: 0.4 10*3/uL (ref 0.0–0.7)
Eosinophils Relative: 4 % (ref 0–5)
HCT: 42.4 % (ref 39.0–52.0)
HEMOGLOBIN: 14.7 g/dL (ref 13.0–17.0)
Lymphocytes Relative: 21 % (ref 12–46)
Lymphs Abs: 2 10*3/uL (ref 0.7–4.0)
MCH: 32.1 pg (ref 26.0–34.0)
MCHC: 34.7 g/dL (ref 30.0–36.0)
MCV: 92.6 fL (ref 78.0–100.0)
Monocytes Absolute: 1 10*3/uL (ref 0.1–1.0)
Monocytes Relative: 10 % (ref 3–12)
NEUTROS PCT: 65 % (ref 43–77)
Neutro Abs: 6.1 10*3/uL (ref 1.7–7.7)
PLATELETS: 170 10*3/uL (ref 150–400)
RBC: 4.58 MIL/uL (ref 4.22–5.81)
RDW: 13.3 % (ref 11.5–15.5)
WBC: 9.5 10*3/uL (ref 4.0–10.5)

## 2015-04-27 LAB — COMPREHENSIVE METABOLIC PANEL
ALT: 14 U/L (ref 0–53)
AST: 23 U/L (ref 0–37)
Albumin: 3.2 g/dL — ABNORMAL LOW (ref 3.5–5.2)
Alkaline Phosphatase: 83 U/L (ref 39–117)
Anion gap: 7 (ref 5–15)
BILIRUBIN TOTAL: 1.3 mg/dL — AB (ref 0.3–1.2)
BUN: 11 mg/dL (ref 6–23)
CHLORIDE: 101 mmol/L (ref 96–112)
CO2: 27 mmol/L (ref 19–32)
CREATININE: 0.93 mg/dL (ref 0.50–1.35)
Calcium: 8.9 mg/dL (ref 8.4–10.5)
GFR calc Af Amer: >90 mL/min (ref >90)
GFR, EST NON AFRICAN AMERICAN: 84 mL/min — AB (ref >90)
GLUCOSE: 106 mg/dL — AB (ref 70–99)
Potassium: 4 mmol/L (ref 3.5–5.1)
Sodium: 135 mmol/L (ref 135–145)
Total Protein: 7.5 g/dL (ref 6.0–8.3)

## 2015-04-27 MED ORDER — CEPHALEXIN 500 MG PO CAPS: 500.0000 mg | ORAL_CAPSULE | Freq: Three times a day (TID) | ORAL | Status: DC

## 2015-04-27 NOTE — Progress Notes (Signed)
[name] presented for labwork. Labs per MD order drawn via Peripheral Line 23 gauge needle inserted in left AC  Good blood return present. Procedure without incident.  Needle removed intact. Patient tolerated procedure well.

## 2015-04-27 NOTE — Patient Instructions (Addendum)
[name] at [facility] Discharge Instructions  RECOMMENDATIONS MADE BY THE CONSULTANT AND ANY TEST RESULTS WILL BE SENT TO YOUR REFERRING PHYSICIAN.  Exam and discussion by Dr. Youlanda Roys have Stage IIA colon cancer. Plans are to get Dr. Arnoldo Morale to evaluate your incision and get you scheduled for a port a catheter placed. (Call Mickie Kay, RN  [phone] and let me know when your port is going to be placed.) Chemotherapy teaching with Lupita Raider - she will contact you with the date and time for teaching. Will be treating you with a regimen called FOLFOX and you will be treated every 2 weeks for 6 months. Keflex - take as directed.  Call with any questions or concerns We will see you in follow-up when you get treated.  Thank you for choosing [place] at [facility] to provide your oncology and hematology care.  To afford each patient quality time with our provider, please arrive at least 15 minutes before your scheduled appointment time.    You need to re-schedule your appointment should you arrive 10 or more minutes late.  We strive to give you quality time with our providers, and arriving late affects you and other patients whose appointments are after yours.  Also, if you no show three or more times for appointments you may be dismissed from the clinic at the providers discretion.     Again, thank you for choosing [facility].  Our hope is that these requests will decrease the amount of time that you wait before being seen by our physicians.       _____________________________________________________________  Should you have questions after your visit to [facility], please contact our office at [phone] between the hours of 8:30 a.m. and 4:30 p.m.  Voicemails left after 4:30 p.m. will not be returned until the following business day.  For prescription refill requests, have your pharmacy contact our office.     Implanted Port Insertion An implanted port is a central line that has a round shape and is placed under the skin. It is used as a long-term IV access for:   Medicines, such as chemotherapy.   Fluids.   Liquid nutrition, such as total parenteral nutrition (TPN).   Blood samples.  LET [facility] CARE PROVIDER KNOW ABOUT:  Allergies to food or medicine.   Medicines taken, including vitamins, herbs, eye drops, creams, and over-the-counter medicines.   Any allergies to heparin.  Use of steroids (by mouth or creams).   Previous problems with anesthetics or numbing medicines.   History of bleeding problems or blood clots.   Previous surgery.   Other health problems, including diabetes and kidney problems.   Possibility of pregnancy, if this applies. RISKS AND COMPLICATIONS Generally, this is a safe procedure. However, as with any procedure, problems can occur. Possible problems include:  Damage to the blood vessel, bruising, or bleeding at the puncture site.   Infection.  Blood clot in the vessel that the port is in.  Breakdown of the skin over your port.  Very rarely a person may develop a condition called a pneumothorax, a collection of air in the chest that may cause one of the lungs to collapse. The placement of these catheters with the appropriate imaging guidance significantly decreases the risk of a pneumothorax.  BEFORE THE PROCEDURE   Your health care provider may want you to have blood tests. These tests can help tell how  well your kidneys and liver are working. They can also show how well your blood clots.   If you take blood thinners (anticoagulant medicines), ask your health care provider when you should stop taking them.   Make arrangements for someone to drive you home. This is necessary if you have been sedated for your procedure.  PROCEDURE  Port insertion usually takes about 30-45 minutes.   An IV needle will be inserted in your arm.  Medicine for pain and medicine to help relax you (sedative) will flow directly into your body through this needle.   You will lie on an exam table, and you will be connected to monitors to keep track of your heart rate, blood pressure, and breathing throughout the procedure.  An oxygen monitoring device may be attached to your finger. Oxygen will be given.   Everything will be kept as germ free (sterile) as possible during the procedure. The skin near the point of the incision will be cleansed with antiseptic, and the area will be draped with sterile towels. The skin and deeper tissues over the port area will be made numb with a local anesthetic.  Two small cuts (incisions) will be made in the skin to insert the port. One will be made in the neck to obtain access to the vein where the catheter will lie.   Because the port reservoir will be placed under the skin, a small skin incision will be made in the upper chest, and a small pocket for the port will be made under the skin. The catheter that will be connected to the port tunnels to a large central vein in the chest. A small, raised area will remain on your body at the site of the reservoir when the procedure is complete.  The port placement will be done under imaging guidance to ensure the proper placement.  The reservoir has a silicone covering that can be punctured with a special needle.   The port will be flushed with normal saline, and blood will be drawn to make sure it is working properly.  There will be nothing remaining outside the skin when the procedure is finished.   Incisions will be held together by stitches, surgical glue, or a special tape. AFTER THE PROCEDURE  You will stay in a recovery area until the anesthesia has worn off. Your blood pressure and pulse will be checked.  A final chest X-ray will be taken to check the placement of the port and to ensure that there is no injury to your lung. Document Released:  10/06/2013 Document Revised: 05/02/2014 Document Reviewed: 10/06/2013 [facility] Patient Information 2015 [place], [place]. This information is not intended to replace advice given to you by your health care provider. Make sure you discuss any questions you have with your health care provider. Leucovorin injection What is this medicine? LEUCOVORIN (loo koe VOR in) is used to prevent or treat the harmful effects of some medicines. This medicine is used to treat anemia caused by a low amount of folic acid in the body. It is also used with 5-fluorouracil (5-FU) to treat colon cancer. This medicine may be used for other purposes; ask your health care provider or pharmacist if you have questions. What should I tell my health care provider before I take this medicine? They need to know if you have any of these conditions: -anemia from low levels of vitamin B-12 in the blood -an unusual or allergic reaction to leucovorin, folic acid, other medicines, foods, dyes, or  preservatives -pregnant or trying to get pregnant -breast-feeding How should I use this medicine? This medicine is for injection into a muscle or into a vein. It is given by a health care professional in a hospital or clinic setting. Talk to your pediatrician regarding the use of this medicine in children. Special care may be needed. Overdosage: If you think you have taken too much of this medicine contact a poison control center or emergency room at once. NOTE: This medicine is only for you. Do not share this medicine with others. What if I miss a dose? This does not apply. What may interact with this medicine? -capecitabine -fluorouracil -phenobarbital -phenytoin -primidone -trimethoprim-sulfamethoxazole This list may not describe all possible interactions. Give your health care provider a list of all the medicines, herbs, non-prescription drugs, or dietary supplements you use. Also tell them if you smoke, drink alcohol, or use illegal  drugs. Some items may interact with your medicine. What should I watch for while using this medicine? Your condition will be monitored carefully while you are receiving this medicine. This medicine may increase the side effects of 5-fluorouracil, 5-FU. Tell your doctor or health care professional if you have diarrhea or mouth sores that do not get better or that get worse. What side effects may I notice from receiving this medicine? Side effects that you should report to your doctor or health care professional as soon as possible: -allergic reactions like skin rash, itching or hives, swelling of the face, lips, or tongue -breathing problems -fever, infection -mouth sores -unusual bleeding or bruising -unusually weak or tired Side effects that usually do not require medical attention (report to your doctor or health care professional if they continue or are bothersome): -constipation or diarrhea -loss of appetite -nausea, vomiting This list may not describe all possible side effects. Call your doctor for medical advice about side effects. You may report side effects to FDA at 1-800-FDA-1088. Where should I keep my medicine? This drug is given in a hospital or clinic and will not be stored at home. NOTE: This sheet is a summary. It may not cover all possible information. If you have questions about this medicine, talk to your doctor, pharmacist, or health care provider.  2015, [employer]vier/[employer] Standard. (2008-06-21 16:50:29) Fluorouracil, 5-FU injection What is this medicine? FLUOROURACIL, 5-FU (flure oh YOOR a sil) is a chemotherapy drug. It slows the growth of cancer cells. This medicine is used to treat many types of cancer like breast cancer, colon or rectal cancer, pancreatic cancer, and stomach cancer. This medicine may be used for other purposes; ask your health care provider or pharmacist if you have questions. COMMON BRAND NAME(S): Adrucil What should I tell my health care provider  before I take this medicine? They need to know if you have any of these conditions: -blood disorders -dihydropyrimidine dehydrogenase (DPD) deficiency -infection (especially a virus infection such as chickenpox, cold sores, or herpes) -kidney disease -liver disease -malnourished, poor nutrition -recent or ongoing radiation therapy -an unusual or allergic reaction to fluorouracil, other chemotherapy, other medicines, foods, dyes, or preservatives -pregnant or trying to get pregnant -breast-feeding How should I use this medicine? This drug is given as an infusion or injection into a vein. It is administered in a hospital or clinic by a specially trained health care professional. Talk to your pediatrician regarding the use of this medicine in children. Special care may be needed. Overdosage: If you think you have taken too much of this medicine contact a poison control  center or emergency room at once. NOTE: This medicine is only for you. Do not share this medicine with others. What if I miss a dose? It is important not to miss your dose. Call your doctor or health care professional if you are unable to keep an appointment. What may interact with this medicine? -allopurinol -cimetidine -dapsone -digoxin -hydroxyurea -leucovorin -levamisole -medicines for seizures like ethotoin, fosphenytoin, phenytoin -medicines to increase blood counts like filgrastim, pegfilgrastim, sargramostim -medicines that treat or prevent blood clots like warfarin, enoxaparin, and dalteparin -methotrexate -metronidazole -pyrimethamine -some other chemotherapy drugs like busulfan, cisplatin, estramustine, vinblastine -trimethoprim -trimetrexate -vaccines Talk to your doctor or health care professional before taking any of these medicines: -acetaminophen -aspirin -ibuprofen -ketoprofen -naproxen This list may not describe all possible interactions. Give your health care provider a list of all the  medicines, herbs, non-prescription drugs, or dietary supplements you use. Also tell them if you smoke, drink alcohol, or use illegal drugs. Some items may interact with your medicine. What should I watch for while using this medicine? Visit your doctor for checks on your progress. This drug may make you feel generally unwell. This is not uncommon, as chemotherapy can affect healthy cells as well as cancer cells. Report any side effects. Continue your course of treatment even though you feel ill unless your doctor tells you to stop. In some cases, you may be given additional medicines to help with side effects. Follow all directions for their use. Call your doctor or health care professional for advice if you get a fever, chills or sore throat, or other symptoms of a cold or flu. Do not treat yourself. This drug decreases your body's ability to fight infections. Try to avoid being around people who are sick. This medicine may increase your risk to bruise or bleed. Call your doctor or health care professional if you notice any unusual bleeding. Be careful brushing and flossing your teeth or using a toothpick because you may get an infection or bleed more easily. If you have any dental work done, tell your dentist you are receiving this medicine. Avoid taking products that contain aspirin, acetaminophen, ibuprofen, naproxen, or ketoprofen unless instructed by your doctor. These medicines may hide a fever. Do not become pregnant while taking this medicine. Women should inform their doctor if they wish to become pregnant or think they might be pregnant. There is a potential for serious side effects to an unborn child. Talk to your health care professional or pharmacist for more information. Do not breast-feed an infant while taking this medicine. Men should inform their doctor if they wish to father a child. This medicine may lower sperm counts. Do not treat diarrhea with over the counter products. Contact your  doctor if you have diarrhea that lasts more than 2 days or if it is severe and watery. This medicine can make you more sensitive to the sun. Keep out of the sun. If you cannot avoid being in the sun, wear protective clothing and use sunscreen. Do not use sun lamps or tanning beds/booths. What side effects may I notice from receiving this medicine? Side effects that you should report to your doctor or health care professional as soon as possible: -allergic reactions like skin rash, itching or hives, swelling of the face, lips, or tongue -low blood counts - this medicine may decrease the number of white blood cells, red blood cells and platelets. You may be at increased risk for infections and bleeding. -signs of infection - fever or  chills, cough, sore throat, pain or difficulty passing urine -signs of decreased platelets or bleeding - bruising, pinpoint red spots on the skin, black, tarry stools, blood in the urine -signs of decreased red blood cells - unusually weak or tired, fainting spells, lightheadedness -breathing problems -changes in vision -chest pain -mouth sores -nausea and vomiting -pain, swelling, redness at site where injected -pain, tingling, numbness in the hands or feet -redness, swelling, or sores on hands or feet -stomach pain -unusual bleeding Side effects that usually do not require medical attention (report to your doctor or health care professional if they continue or are bothersome): -changes in finger or toe nails -diarrhea -dry or itchy skin -hair loss -headache -loss of appetite -sensitivity of eyes to the light -stomach upset -unusually teary eyes This list may not describe all possible side effects. Call your doctor for medical advice about side effects. You may report side effects to FDA at 1-800-FDA-1088. Where should I keep my medicine? This drug is given in a hospital or clinic and will not be stored at home. NOTE: This sheet is a summary. It may not  cover all possible information. If you have questions about this medicine, talk to your doctor, pharmacist, or health care provider.  2015, [employer]vier/[employer] Standard. (2008-04-20 13:53:16) Oxaliplatin Injection What is this medicine? OXALIPLATIN (ox AL i PLA tin) is a chemotherapy drug. It targets fast dividing cells, like cancer cells, and causes these cells to die. This medicine is used to treat cancers of the colon and rectum, and many other cancers. This medicine may be used for other purposes; ask your health care provider or pharmacist if you have questions. COMMON BRAND NAME(S): Eloxatin What should I tell my health care provider before I take this medicine? They need to know if you have any of these conditions: -kidney disease -an unusual or allergic reaction to oxaliplatin, other chemotherapy, other medicines, foods, dyes, or preservatives -pregnant or trying to get pregnant -breast-feeding How should I use this medicine? This drug is given as an infusion into a vein. It is administered in a hospital or clinic by a specially trained health care professional. Talk to your pediatrician regarding the use of this medicine in children. Special care may be needed. Overdosage: If you think you have taken too much of this medicine contact a poison control center or emergency room at once. NOTE: This medicine is only for you. Do not share this medicine with others. What if I miss a dose? It is important not to miss a dose. Call your doctor or health care professional if you are unable to keep an appointment. What may interact with this medicine? -medicines to increase blood counts like filgrastim, pegfilgrastim, sargramostim -probenecid -some antibiotics like amikacin, gentamicin, neomycin, polymyxin B, streptomycin, tobramycin -zalcitabine Talk to your doctor or health care professional before taking any of these medicines: -acetaminophen -aspirin -ibuprofen -ketoprofen -naproxen This  list may not describe all possible interactions. Give your health care provider a list of all the medicines, herbs, non-prescription drugs, or dietary supplements you use. Also tell them if you smoke, drink alcohol, or use illegal drugs. Some items may interact with your medicine. What should I watch for while using this medicine? Your condition will be monitored carefully while you are receiving this medicine. You will need important blood work done while you are taking this medicine. This medicine can make you more sensitive to cold. Do not drink cold drinks or use ice. Cover exposed skin before coming in contact  with cold temperatures or cold objects. When out in cold weather wear warm clothing and cover your mouth and nose to warm the air that goes into your lungs. Tell your doctor if you get sensitive to the cold. This drug may make you feel generally unwell. This is not uncommon, as chemotherapy can affect healthy cells as well as cancer cells. Report any side effects. Continue your course of treatment even though you feel ill unless your doctor tells you to stop. In some cases, you may be given additional medicines to help with side effects. Follow all directions for their use. Call your doctor or health care professional for advice if you get a fever, chills or sore throat, or other symptoms of a cold or flu. Do not treat yourself. This drug decreases your body's ability to fight infections. Try to avoid being around people who are sick. This medicine may increase your risk to bruise or bleed. Call your doctor or health care professional if you notice any unusual bleeding. Be careful brushing and flossing your teeth or using a toothpick because you may get an infection or bleed more easily. If you have any dental work done, tell your dentist you are receiving this medicine. Avoid taking products that contain aspirin, acetaminophen, ibuprofen, naproxen, or ketoprofen unless instructed by your doctor.  These medicines may hide a fever. Do not become pregnant while taking this medicine. Women should inform their doctor if they wish to become pregnant or think they might be pregnant. There is a potential for serious side effects to an unborn child. Talk to your health care professional or pharmacist for more information. Do not breast-feed an infant while taking this medicine. Call your doctor or health care professional if you get diarrhea. Do not treat yourself. What side effects may I notice from receiving this medicine? Side effects that you should report to your doctor or health care professional as soon as possible: -allergic reactions like skin rash, itching or hives, swelling of the face, lips, or tongue -low blood counts - This drug may decrease the number of white blood cells, red blood cells and platelets. You may be at increased risk for infections and bleeding. -signs of infection - fever or chills, cough, sore throat, pain or difficulty passing urine -signs of decreased platelets or bleeding - bruising, pinpoint red spots on the skin, black, tarry stools, nosebleeds -signs of decreased red blood cells - unusually weak or tired, fainting spells, lightheadedness -breathing problems -chest pain, pressure -cough -diarrhea -jaw tightness -mouth sores -nausea and vomiting -pain, swelling, redness or irritation at the injection site -pain, tingling, numbness in the hands or feet -problems with balance, talking, walking -redness, blistering, peeling or loosening of the skin, including inside the mouth -trouble passing urine or change in the amount of urine Side effects that usually do not require medical attention (report to your doctor or health care professional if they continue or are bothersome): -changes in vision -constipation -hair loss -loss of appetite -metallic taste in the mouth or changes in taste -stomach pain This list may not describe all possible side effects. Call  your doctor for medical advice about side effects. You may report side effects to FDA at 1-800-FDA-1088. Where should I keep my medicine? This drug is given in a hospital or clinic and will not be stored at home. NOTE: This sheet is a summary. It may not cover all possible information. If you have questions about this medicine, talk to your doctor, pharmacist, or  health care provider.  2015, Elsevier/[employer] Standard. (2008-07-12 17:22:47)

## 2015-04-27 NOTE — Progress Notes (Addendum)
[place] CONSULT NOTE  Patient Care Team: [name], MD as PCP - General (Internal Medicine) [name], MD as Consulting Physician (Gastroenterology)  CHIEF COMPLAINTS/PURPOSE OF CONSULTATION:   T3, N0, M0  adenocarcinoma of the colon  HISTORY OF PRESENTING ILLNESS:  [name] 69 y.o. male presents for additional discussion regarding a new diagnosis of colon cancer.  The patient was admitted to [facility] in January with atypical chest pain and was found to be in atrial fibrillation which was new. Per records he had not seen his PCP Dr. Luan Pulling in approximately one year.  He was discharged from the hospital on Eliquis.  He then began to develop very dark "black" stool with BRBPR. He had hemoccults performed that were postiive X 3.  He was referred to GI and underwent a colonoscopy on 03/28/2015 with an obstructing colon mass in the descending colon. The colonoscope could NOT be passed beyond the lesion.  He underwent a partial colectomy with Dr. Arnoldo Morale on 04/03/2015. Final pathology was consistent with a T3, N0, M0 CRC. CEA preoperatively was elevated at 76.6 ng/ml  He presents today for additional discussion of ongoing follow-up and if adjuvant treatment is indicated.  Reports having low energy. intermittent diarrhea. Reports unintentional weight loss and lack of appetite. Has sleep apnea  MEDICAL HISTORY:  Past Medical History  Diagnosis Date  . Varicose veins   . New onset atrial fibrillation 01/02/2015  . Morbid obesity 01/02/2015  . Hypertension   . Dysrhythmia     Afib- on set 12/2014  . Sleep apnea     been tested but has not received the CPAP yet  . GERD (gastroesophageal reflux disease)   . History of gout   . Cancer     colon    SURGICAL HISTORY: Past Surgical History  Procedure Laterality Date  . Vein ligation and stripping      left leg  . Colonoscopy N/A 03/28/2015    Procedure: COLONOSCOPY;  Surgeon: [name], MD;  Location: [facility]  ENDO SUITE;  Service: Endoscopy;  Laterality: N/A;  . Esophagogastroduodenoscopy N/A 03/28/2015    Procedure: ESOPHAGOGASTRODUODENOSCOPY (EGD);  Surgeon: [name], MD;  Location: [facility] ENDO SUITE;  Service: Endoscopy;  Laterality: N/A;  . Esophageal dilation N/A 03/28/2015    Procedure: ESOPHAGEAL DILATION;  Surgeon: [name], MD;  Location: [facility] ENDO SUITE;  Service: Endoscopy;  Laterality: N/A;  . Partial colectomy N/A 04/03/2015    Procedure: PARTIAL COLECTOMY;  Surgeon: [name], MD;  Location: [facility] ORS;  Service: General;  Laterality: N/A;    SOCIAL HISTORY: History   Social History  . Marital Status: Married    Spouse Name: N/A  . Number of Children: 2  . Years of Education: N/A   Occupational History  . Not on file.   Social History Main Topics  . Smoking status: Never Smoker   . Smokeless tobacco: Never Used  . Alcohol Use: No  . Drug Use: No  . Sexual Activity: No   Other Topics Concern  . Not on file   Social History Narrative   Never smoker or drinker Worked for a [employer] company for 29 years. Married for 29 years. 2 sons and 1 step-son. 7 grand child and 1 great grand.    FAMILY HISTORY: Family History  Problem Relation Age of Onset  . Cancer Mother     colon, age mid-64s  . Cancer Father     liver  .  Heart disease Father   . Hyperlipidemia Father   . Cancer Sister     slow "blood" cancer  . Diabetes Sister    indicated that his mother is deceased. He indicated that his father is deceased. He indicated that his sister is deceased.    His mom had of colon cancer. She died of lung cancer. His father died at 42 of liver cancer 1 bother, died of diabetes and 1 sister, died of heart complication. She had bypass surgery    ALLERGIES:  has No Known Allergies.  MEDICATIONS:  Current Outpatient Prescriptions  Medication Sig Dispense Refill  . lisinopril (PRINIVIL,ZESTRIL) 10 MG tablet Take 1 tablet (10 mg total) by mouth daily. 30 tablet 12    . omeprazole (PRILOSEC) 20 MG capsule Take 1 capsule (20 mg total) by mouth daily. 30 capsule 3  . HYDROcodone-acetaminophen (NORCO/VICODIN) 5-325 MG per tablet Take 1-2 tablets by mouth every 6 (six) hours as needed for moderate pain. (Patient not taking: Reported on 04/27/2015) 40 tablet 0  . ibuprofen (ADVIL,MOTRIN) 200 MG tablet Take 400 mg by mouth every 6 (six) hours as needed for mild pain or moderate pain (for knee pain).     No current facility-administered medications for this visit.    Review of Systems  Constitutional: Positive for weight loss and malaise/fatigue. Negative for fever, chills and diaphoresis.  HENT: Negative.   Eyes: Negative.   Respiratory: Positive for shortness of breath.   Cardiovascular: Positive for palpitations.  Gastrointestinal: Positive for diarrhea.       Since surgery increase in stools per day  Genitourinary: Negative.   Musculoskeletal: Positive for joint pain.  Skin: Negative.   Neurological: Negative for weakness.  Endo/Heme/Allergies: Negative.   Psychiatric/Behavioral: Negative.   All other ROS reviewed and negative   PHYSICAL EXAMINATION: ECOG PERFORMANCE STATUS: 1 - Symptomatic but completely ambulatory  Filed Vitals:   04/27/15 1503  BP: 100/57  Pulse: 88  Temp: 97.6 F (36.4 C)  Resp: 20   Filed Weights   04/27/15 1503  Weight: 281 lb 3.2 oz (127.551 kg)     Physical Exam  Constitutional: He is oriented to person, place, and time and well-developed, well-nourished, and in no distress.  HENT:  Head: Normocephalic and atraumatic.  Nose: Nose normal.  Mouth/Throat: Oropharynx is clear and moist. No oropharyngeal exudate.  Eyes: Conjunctivae and EOM are normal. Pupils are equal, round, and reactive to light. Right eye exhibits no discharge. Left eye exhibits no discharge. No scleral icterus.  Neck: Normal range of motion. Neck supple. No tracheal deviation present. No thyromegaly present.  Cardiovascular: Normal rate,  regular rhythm and normal heart sounds.  Exam reveals no gallop and no friction rub.   No murmur heard. Afib  Pulmonary/Chest: Effort normal and breath sounds normal. He has no wheezes. He has no rales.  Abdominal: Soft. Bowel sounds are normal. He exhibits no distension and no mass. There is no tenderness. There is no rebound and no guarding.  Minimal serosanguinous draining from surgical incision site  Musculoskeletal: Normal range of motion. He exhibits no edema.  Lymphadenopathy:    He has no cervical adenopathy.  Neurological: He is alert and oriented to person, place, and time. He has normal reflexes. No cranial nerve deficit. Gait normal. Coordination normal.  Skin: Skin is warm and dry. No rash noted.  Psychiatric: Mood, memory, affect and judgment normal.  Nursing note and vitals reviewed.   LABORATORY DATA:  I have reviewed the data as listed  Lab Results  Component Value Date   WBC 10.7* 04/05/2015   HGB 12.7* 04/05/2015   HCT 39.0 04/05/2015   MCV 98.0 04/05/2015   PLT 150 04/05/2015   PATHOLOGY: Mismatch Repair (MMR) Protein Immunohistochemistry (IHC) IHC Expression Result: MLH1: Preserved nuclear expression (greater 50% tumor expression) MSH2: Preserved nuclear expression (greater 50% tumor expression) MSH6: Preserved nuclear expression (greater 50% tumor expression) PMS2: Preserved nuclear expression (greater 50% tumor expression) * Internal control demonstrates intact nuclear expression Interpretation: NORMAL There is preserved expression of the major and minor MMR proteins. There is a very low probability that microsatellite instability (MSI) is present. However, certain clinically significant MMR protein mutations may result in preservation of nuclear expression. It is recommended that the preservation of protein expression be correlated with molecular based MSI testing. References: 1. Guidelines on Genetic Evaluation and Management of Lynch Syndrome: A  Consensus Statement by the [place] Multi-Society Task Force on Colorectal Cancer Gae Dry. Sherlie Ban , MD, and others . Am J Gastroenterol 2014;   Diagnosis Colon, segmental resection for tumor - INVASIVE ADENOCARCINOMA, INVADING THROUGH THE MUSCULARIS PROPRIA INTO PERICOLONIC FATTY TISSUE. - FOURTEEN LYMPH NODES, NEGATIVE FOR METASTATIC CARCINOMA (0/14). - RESECTION MARGINS, NEGATIVE FOR ATYPIA OR MALIGNANCY. Microscopic Comment COLON Specimen: Left colon Procedure: Segmental resection Tumor site: Left descending colon Specimen integrity: Intact Macroscopic intactness of mesorectum: N/A Macroscopic tumor perforation: No Invasive tumor: Maximum size: 7.0 cm Histologic type(s): Invasive adenocarcinoma Histologic grade and differentiation: G2: moderately differentiated/low grade Type of polyp in which invasive carcinoma arose: N/A Microscopic extension of invasive tumor: Invading through the muscularis propria into pericolonic fatty tissue Lymph-Vascular invasion: Not identified. Peri-neural invasion: Not identified Tumor deposit(s) (discontinuous extramural extension): No Resection margins: Negative Proximal margin: 8 cm Distal margin: 16 cm Mesenteric margin (sigmoid and transverse): 5 cm Treatment effect (neo-adjuvant therapy): No Additional polyp(s): No Non-neoplastic findings: N/A Lymph nodes: number examined 14; number positive: 0 Pathologic Staging: pT3, pN0, pMX Ancillary studies: MMR stains will be performed and an addendum report will follow; MSI testing will be sent out and the final report will be available in EPIC. (HCL:kh 04-05-15) Aldona Bar MD Pathologist, Electronic Signature (Case signed 04/05/2015)   RADIOGRAPHIC STUDIES: I have personally reviewed the radiological images as listed and agreed with the findings in the report.  CLINICAL DATA: Low abdominal pain which began today. History of colon cancer.  EXAM: 03/29/2015 CT ABDOMEN AND PELVIS WITH  CONTRAST  TECHNIQUE: Multidetector CT imaging of the abdomen and pelvis was performed using the standard protocol following bolus administration of intravenous contrast.  CONTRAST: 162m OMNIPAQUE IOHEXOL 300 MG/ML SOLN  COMPARISON: CT thorax 01/02/2015 scroll IMPRESSION: 1. Circumferential mucosal lesion the proximal sigmoid colon consists with colorectal carcinoma. 2. No high-grade obstruction. 3. No evidence of metastatic disease in the abdomen pelvis. 4. 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 5. Left adrenal adenoma.   Electronically Signed  By: SSuzy BouchardM.D.  On: 03/29/2015 18:43  CLINICAL DATA: Colon cancer  EXAM: CHEST 2 VIEW  COMPARISON: 01/02/2015  FINDINGS: The lungs appear clear. Cardiac and mediastinal contours normal.  No pleural effusion identified.  Healed left lateral fourth rib fracture. Mild lower thoracic and upper lumbar spondylosis.  IMPRESSION: 1. No active cardiopulmonary disease. Thoracolumbar spondylosis.   Electronically Signed  By: WVan ClinesM.D.  On: 03/29/2015 15:15   ASSESSMENT & PLAN:  Stage II CRC, T3 N0 M0 Obstructing colon lesion S/P partial hemicolectomy High pre-operative CEA  I discussed the controversy  of chemotherapy in Stage II CRC. I advised the patient that he generally has a good prognosis and chemotherapy has real toxicities. We discussed the small benefit of adjuvant therapy in reducing risk of recurrence of his disease.  From uptodate: Predicted five year DFS estimates for patients with node-negative colon cancer  Disease stage Low grade High grade   Surgery alone, percent Surgery and chemo, percent Surgery alone, percent Surgery and chemo, percent  T3N0 73 (69 to 76) 77 (74 to 80) 65 (60 to 70) 70 (65 to 74)  T4N0 60 (54 to 68) 66 (59 to 73) 51 (43 to 60) 57 (49 to 66)  Range of numbers in parentheses represent  95 percent confidence intervals.  DFS: disease-free survival; chemo: chemotherapy.  Based upon a model derived from 1440 patients with node-negative colon cancer enrolled on seven randomized trials comparing adjuvant 5-fluorouracil-based treatment with surgery alone Ferd Glassing, et al. J Clin Oncol 2004; 22:1797).  Graphic [id number] Version 3.0  Looking at his pathology, he has "minimal high risk features" ie. No LVI, no PNI, tumor is moderately differentiated, 14 LN were sampled and a T3 tumor.  He did have obstruction and a high preoperatie CEA (although of uncertain significance). He would be a candidate for the 12 gene recurrence score. He does NOT have evidence of MSI. Adjuvant chemotherapy should be considered for most patients with proficient MMR (pMMR) tumors and clinicopathologic high-risk features, particularly those with T4 cancers or multiple high-risk features  Patients without high-risk features who have MSI-unstable (MSI-H)/dMMR tumors have a favorable prognosis and are not likely to derive significant benefit from adjuvant fluoropyrimidine-based therapy.   As discussed above, the magnitude of the absolute overall survival benefit from adjuvant therapy seems to be very small for the average patient with stage II disease. However, most trials have shown at least a disease-free survival (DFS) benefit for adjuvant chemotherapy in patients with stage II disease.   I advised the patient that he can think about these issues and call back with his decision or we can bring him back next week to discuss. He feels very committed that he wants to try anything that will improve the likelihood his cancer may not return.  I again emphasized that overall benefit to chemotherapy is small. He feels comfortable with that and wants to proceed.We can arrange for port placement and chemotherapy teaching.  He will restart his eliquis once his port is placed.   All questions were answered. The patient knows to call the  clinic with any problems, questions or concerns. This note was electronically signed.   I have reviewed the above documentation for accuracy and completeness and I agree with the above.  This document serves as a record of services personally performed by Ancil Linsey, MD. It was created on her behalf by Pearlie Oyster, a trained medical scribe. The creation of this record is based on the scribe's personal observations and the provider's statements to them. This document has been checked and approved by the attending provider.    Molli Hazard, MD

## 2015-04-28 ENCOUNTER — Other Ambulatory Visit (HOSPITAL_COMMUNITY): Payer: Self-pay | Admitting: Oncology

## 2015-04-28 ENCOUNTER — Encounter (HOSPITAL_COMMUNITY): Payer: Self-pay | Admitting: Lab

## 2015-04-28 ENCOUNTER — Encounter: Payer: Self-pay | Admitting: Dietician

## 2015-04-28 LAB — CEA: CEA: 3 ng/mL (ref 0.0–4.7)

## 2015-04-28 LAB — IRON AND TIBC
IRON: 67 ug/dL (ref 42–165)
SATURATION RATIOS: 25 % (ref 20–55)
TIBC: 263 ug/dL (ref 215–435)
UIBC: 196 ug/dL (ref 125–400)

## 2015-04-28 LAB — FERRITIN: Ferritin: 78 ng/mL (ref 22–322)

## 2015-04-28 MED ORDER — PROCHLORPERAZINE MALEATE 10 MG PO TABS
10.0000 mg | ORAL_TABLET | Freq: Four times a day (QID) | ORAL | Status: DC | PRN
Start: 2015-04-28 — End: 2015-10-13

## 2015-04-28 MED ORDER — ONDANSETRON HCL 8 MG PO TABS: 8.0000 mg | ORAL_TABLET | Freq: Three times a day (TID) | ORAL | Status: DC | PRN

## 2015-04-28 MED ORDER — LIDOCAINE-PRILOCAINE 2.5-2.5 % EX CREA: TOPICAL_CREAM | CUTANEOUS | Status: DC

## 2015-04-28 NOTE — Patient Instructions (Signed)
[place]   CHEMOTHERAPY INSTRUCTIONS  Premeds: Zofran - this is to prevent nausea/vomiting. Dexamethasone - this is to reduce the risk of you having an allergic type reaction to the Oxaliplatin. Side Effect of Dexamethasone - increase in energy, trouble sleeping, may turn your face/neck pink/red or make you feel flushed in the face/neck. The side effects of Dexamethasone will pass as the medication wears off. (takes 30 minutes to infuse)  Oxaliplatin - anaphylactic reaction, neurotoxicity (i.e., headache, fatigue, difficulty sleeping, pain). Peripheral neuropathy (numbness/tingling/burning in hands/fingers/feet/toes) - will be aggravated by cold/cool temperatures. We need to know when you develop peripheral neuropathy so that we can monitor it and treat if necessary. Nausea/vomiting, diarrhea, bone marrow suppression (lowers white blood cells (fight infection), lowers red blood cells (make up your blood), lowers platelets (help blood to clot). Pulmonary fibrosis. Once you have received Oxaliplatin do NOT eat or drinking anything cold/cool for 5-10 days! Do NOT breathe in cold/cool air and do NOT touch anything cold for 5-10 days. The time frame varies from patient to patient on the length of time you must abstain from the above mentioned. Best advice is to wait at least 5 days before attempting to reintroduce cold/cool back into life. Slowly reintroduce cool/cold things! Wear gloves when getting items out of the refrigerator (of course, these would be things you are going to heat to eat)! (this drug takes 2 hours to infuse)  Leucovorin - this is a medication that is not chemo but given with chemo. This med "rescues" the healthy cells before we administer the drug 5FU. This makes the 5FU work better. (this drug takes 2 hours to infuse - it will infuse @ the same time the Oxaliplatin infuses)  5FU: bone marrow suppression (low white blood cells - wbcs fight infection,  low red blood cells - rbcs make up your blood, low platelets - this is what makes your blood clot, nausea/vomiting, diarrhea, mouth sores, hair loss, dry skin, ocular toxicities (increased tear production, sensitivity to light). You must wear sunscreen/sunglasses. Cover your skin when out in sunlight. You will get burned very easily. (this is a IV push - the nurse will sit at the bedside and push this med in approx 10 minutes, then she will hook you up to the ambulatory pump which will infuse 5FU over 46 hours).  (The below is not currently ordered but this may be added to your treatment plan) Neulasta - this medication is not chemo but being given because you have had chemo. It is usually given 24 hours after the completion of chemotherapy. This medication works by boosting your bone marrow's supply of white blood cells. White blood cells are what protect our bodies against infection. The medication is given in the form of a subcutaneous injection. It is given in the fatty tissue of your abdomen. It is a short needle. The major side effect of this medication is bone or muscle pain. The drug of choice to relieve or lessen the pain is Aleve or Ibuprofen. If a physician has ever told you not to take Aleve or Ibuprofen - then don't take it. You should then take Tylenol/acetaminophen. Take either medication as the bottle directs you to. The level of pain you experience as a result of this injection can range from none, to mild or moderate, or severe. Please let us know if you develop moderate or severe bone pain.    POTENTIAL SIDE EFFECTS OF TREATMENT: Increased Susceptibility to Infection, Vomiting,  Constipation, Changes in Character of Skin and Nails (brittleness, dryness,etc.), Pigment Changes (darkening of nail beds, palms of hands, soles of feet, etc.), Bone Marrow Suppression, Abdominal Cramping, Nausea, Diarrhea, Sun Sensitivity and Mouth Sores    EDUCATIONAL MATERIALS GIVEN AND  REVIEWED: Chemotherapy and You booklet Specific Instruction Sheets: Oxaliplatin, Leucovorin, 5FU, Zofran, Dexamethasone, Compazine, EMLA cream, Neulasta   SELF CARE ACTIVITIES WHILE ON CHEMOTHERAPY: Increase your fluid intake 48 hours prior to treatment and drink at least 2 quarts per day after treatment., No alcohol intake., No aspirin or other medications unless approved by your oncologist., Eat foods that are light and easy to digest., No fried, fatty, or spicy foods immediately before or after treatment., Have teeth cleaned professionally before starting treatment. Keep dentures and partial plates clean., Use soft toothbrush and do not use mouthwashes that contain alcohol. Biotene is a good mouthwash that is available at most pharmacies or may be ordered by calling [phone]., Use warm salt water gargles (1 teaspoon salt per 1 quart warm water) before and after meals and at bedtime. Or you may rinse with 2 tablespoons of three -percent hydrogen peroxide mixed in eight ounces of water., Always use sunscreen with SPF (Sun Protection Factor) of 30 or higher., Use your nausea medication as directed to prevent nausea., Use your stool softener or laxative as directed to prevent constipation. and Use your anti-diarrheal medication as directed to stop diarrhea.  Please wash your hands for at least 30 seconds using warm soapy water. Handwashing is the #1 way to prevent the spread of germs. Stay away from sick people or people who are getting over a cold. If you develop respiratory systems such as green/yellow mucus production or productive cough or persistent cough let us know and we will see if you need an antibiotic. It is a good idea to keep a pair of gloves on when going into grocery stores/[employer] to decrease your risk of coming into contact with germs on the carts, etc. Carry alcohol hand gel with you at all times and use it frequently if out in public. All foods need to be cooked thoroughly. No raw  foods. No medium or undercooked meats, eggs. If your food is cooked medium well, it does not need to be hot pink or saturated with bloody liquid at all. Vegetables and fruits need to be washed/rinsed under the faucet with a dish detergent before being consumed. You can eat raw fruits and vegetables unless we tell you otherwise but it would be best if you cooked them or bought frozen. Do not eat off of salad bars or hot bars unless you really trust the cleanliness of the restaurant. If you need dental work, please let Dr. Whitney Muse know before you go for your appointment so that we can coordinate the best possible time for you in regards to your chemo regimen. You need to also let your dentist know that you are actively taking chemo. We may need to do labs prior to your dental appointment. We also want your bowels moving at least every other day. If this is not happening, we need to know so that we can get you on a bowel regimen to help you go.      MEDICATIONS: You have been given prescriptions for the following medications:  EMLA cream. Apply a quarter size amount to port site 1 hour prior to chemo. Do not rub in. Cover with plastic wrap.  Zofran 8mg  tablet. Take 1 tablet every 8 hours as needed for  nausea/vomiting.   Compazine 10mg  tablet. Take 1 tablet every 6 hours as needed for nausea/vomiting.    Over-the-Counter Meds:  Miralax - take 17 grams by mouth daily. Mix this in 8 oz of fluid. May increase to two times daily if needed. This is a stool softener.   If this is not enough to keep your bowels regular then you may add the following:  Senna/Senokot S - Start with 1 tablet twice a day and increase to 4 tablets twice a day if needed. This is a stimulant laxative.   Milk of Magnesia - this is a laxative used to treat moderate to severe constipation. May take 2-4 tablespoons every 8 hours as needed. May increase to 8 tablespoons x 1 dose and if no bowel movement call the [facility].  Imodium - this is for diarrhea. Take 2 tabs after 1st loose stool and then 1 tab every 2 hours until you go a total of 12 hours without a loose stool. Call [facility] if loose stools continue.     SYMPTOMS TO REPORT AS SOON AS POSSIBLE AFTER TREATMENT:  FEVER GREATER THAN 100.5 F  CHILLS WITH OR WITHOUT FEVER  NAUSEA AND VOMITING THAT IS NOT CONTROLLED WITH YOUR NAUSEA MEDICATION  UNUSUAL SHORTNESS OF BREATH  UNUSUAL BRUISING OR BLEEDING  TENDERNESS IN MOUTH AND THROAT WITH OR WITHOUT PRESENCE OF ULCERS  URINARY PROBLEMS  BOWEL PROBLEMS  UNUSUAL RASH    Wear comfortable clothing and clothing appropriate for easy access to any Portacath or PICC line. Let us know if there is anything that we can do to make your therapy better!      I have been informed and understand all of the instructions given to me and have received a copy. I have been instructed to call the clinic [phone] or my family physician as soon as possible for continued medical care, if indicated. I do not have any more questions at this time but understand that I may call the [facility] or the Patient Navigator at [phone] during office hours should I have questions or need assistance in obtaining follow-up care.           Oxaliplatin Injection What is this medicine? OXALIPLATIN (ox AL i PLA tin) is a chemotherapy drug. It targets fast dividing cells, like cancer cells, and causes these cells to die. This medicine is used to treat cancers of the colon and rectum, and many other cancers. This medicine may be used for other purposes; ask your health care provider or pharmacist if you have questions. COMMON BRAND NAME(S): Eloxatin What should I tell my health care provider before I take this medicine? They need to know if you have any of these conditions: -kidney disease -an unusual or allergic reaction to oxaliplatin, other chemotherapy, other medicines, foods, dyes, or  preservatives -pregnant or trying to get pregnant -breast-feeding How should I use this medicine? This drug is given as an infusion into a vein. It is administered in a hospital or clinic by a specially trained health care professional. Talk to your pediatrician regarding the use of this medicine in children. Special care may be needed. Overdosage: If you think you have taken too much of this medicine contact a poison control center or emergency room at once. NOTE: This medicine is only for you. Do not share this medicine with others. What if I miss a dose? It is important not to miss a dose. Call your doctor or health care professional  if you are unable to keep an appointment. What may interact with this medicine? -medicines to increase blood counts like filgrastim, pegfilgrastim, sargramostim -probenecid -some antibiotics like amikacin, gentamicin, neomycin, polymyxin B, streptomycin, tobramycin -zalcitabine Talk to your doctor or health care professional before taking any of these medicines: -acetaminophen -aspirin -ibuprofen -ketoprofen -naproxen This list may not describe all possible interactions. Give your health care provider a list of all the medicines, herbs, non-prescription drugs, or dietary supplements you use. Also tell them if you smoke, drink alcohol, or use illegal drugs. Some items may interact with your medicine. What should I watch for while using this medicine? Your condition will be monitored carefully while you are receiving this medicine. You will need important blood work done while you are taking this medicine. This medicine can make you more sensitive to cold. Do not drink cold drinks or use ice. Cover exposed skin before coming in contact with cold temperatures or cold objects. When out in cold weather wear warm clothing and cover your mouth and nose to warm the air that goes into your lungs. Tell your doctor if you get sensitive to the cold. This drug may make  you feel generally unwell. This is not uncommon, as chemotherapy can affect healthy cells as well as cancer cells. Report any side effects. Continue your course of treatment even though you feel ill unless your doctor tells you to stop. In some cases, you may be given additional medicines to help with side effects. Follow all directions for their use. Call your doctor or health care professional for advice if you get a fever, chills or sore throat, or other symptoms of a cold or flu. Do not treat yourself. This drug decreases your body's ability to fight infections. Try to avoid being around people who are sick. This medicine may increase your risk to bruise or bleed. Call your doctor or health care professional if you notice any unusual bleeding. Be careful brushing and flossing your teeth or using a toothpick because you may get an infection or bleed more easily. If you have any dental work done, tell your dentist you are receiving this medicine. Avoid taking products that contain aspirin, acetaminophen, ibuprofen, naproxen, or ketoprofen unless instructed by your doctor. These medicines may hide a fever. Do not become pregnant while taking this medicine. Women should inform their doctor if they wish to become pregnant or think they might be pregnant. There is a potential for serious side effects to an unborn child. Talk to your health care professional or pharmacist for more information. Do not breast-feed an infant while taking this medicine. Call your doctor or health care professional if you get diarrhea. Do not treat yourself. What side effects may I notice from receiving this medicine? Side effects that you should report to your doctor or health care professional as soon as possible: -allergic reactions like skin rash, itching or hives, swelling of the face, lips, or tongue -low blood counts - This drug may decrease the number of white blood cells, red blood cells and platelets. You may be at  increased risk for infections and bleeding. -signs of infection - fever or chills, cough, sore throat, pain or difficulty passing urine -signs of decreased platelets or bleeding - bruising, pinpoint red spots on the skin, black, tarry stools, nosebleeds -signs of decreased red blood cells - unusually weak or tired, fainting spells, lightheadedness -breathing problems -chest pain, pressure -cough -diarrhea -jaw tightness -mouth sores -nausea and vomiting -pain, swelling, redness or  irritation at the injection site -pain, tingling, numbness in the hands or feet -problems with balance, talking, walking -redness, blistering, peeling or loosening of the skin, including inside the mouth -trouble passing urine or change in the amount of urine Side effects that usually do not require medical attention (report to your doctor or health care professional if they continue or are bothersome): -changes in vision -constipation -hair loss -loss of appetite -metallic taste in the mouth or changes in taste -stomach pain This list may not describe all possible side effects. Call your doctor for medical advice about side effects. You may report side effects to FDA at 1-800-FDA-1088. Where should I keep my medicine? This drug is given in a hospital or clinic and will not be stored at home. NOTE: This sheet is a summary. It may not cover all possible information. If you have questions about this medicine, talk to your doctor, pharmacist, or health care provider.  2015, [employer]vier/[employer] Standard. (2008-07-12 17:22:47) Leucovorin injection What is this medicine? LEUCOVORIN (loo koe VOR in) is used to prevent or treat the harmful effects of some medicines. This medicine is used to treat anemia caused by a low amount of folic acid in the body. It is also used with 5-fluorouracil (5-FU) to treat colon cancer. This medicine may be used for other purposes; ask your health care provider or pharmacist if you have  questions. What should I tell my health care provider before I take this medicine? They need to know if you have any of these conditions: -anemia from low levels of vitamin B-12 in the blood -an unusual or allergic reaction to leucovorin, folic acid, other medicines, foods, dyes, or preservatives -pregnant or trying to get pregnant -breast-feeding How should I use this medicine? This medicine is for injection into a muscle or into a vein. It is given by a health care professional in a hospital or clinic setting. Talk to your pediatrician regarding the use of this medicine in children. Special care may be needed. Overdosage: If you think you have taken too much of this medicine contact a poison control center or emergency room at once. NOTE: This medicine is only for you. Do not share this medicine with others. What if I miss a dose? This does not apply. What may interact with this medicine? -capecitabine -fluorouracil -phenobarbital -phenytoin -primidone -trimethoprim-sulfamethoxazole This list may not describe all possible interactions. Give your health care provider a list of all the medicines, herbs, non-prescription drugs, or dietary supplements you use. Also tell them if you smoke, drink alcohol, or use illegal drugs. Some items may interact with your medicine. What should I watch for while using this medicine? Your condition will be monitored carefully while you are receiving this medicine. This medicine may increase the side effects of 5-fluorouracil, 5-FU. Tell your doctor or health care professional if you have diarrhea or mouth sores that do not get better or that get worse. What side effects may I notice from receiving this medicine? Side effects that you should report to your doctor or health care professional as soon as possible: -allergic reactions like skin rash, itching or hives, swelling of the face, lips, or tongue -breathing problems -fever, infection -mouth  sores -unusual bleeding or bruising -unusually weak or tired Side effects that usually do not require medical attention (report to your doctor or health care professional if they continue or are bothersome): -constipation or diarrhea -loss of appetite -nausea, vomiting This list may not describe all possible side effects. Call  your doctor for medical advice about side effects. You may report side effects to FDA at 1-800-FDA-1088. Where should I keep my medicine? This drug is given in a hospital or clinic and will not be stored at home. NOTE: This sheet is a summary. It may not cover all possible information. If you have questions about this medicine, talk to your doctor, pharmacist, or health care provider.  2015, [employer]vier/Gold Standard. (2008-06-21 16:50:29) Fluorouracil, 5-FU injection What is this medicine? FLUOROURACIL, 5-FU (flure oh YOOR a sil) is a chemotherapy drug. It slows the growth of cancer cells. This medicine is used to treat many types of cancer like breast cancer, colon or rectal cancer, pancreatic cancer, and stomach cancer. This medicine may be used for other purposes; ask your health care provider or pharmacist if you have questions. COMMON BRAND NAME(S): Adrucil What should I tell my health care provider before I take this medicine? They need to know if you have any of these conditions: -blood disorders -dihydropyrimidine dehydrogenase (DPD) deficiency -infection (especially a virus infection such as chickenpox, cold sores, or herpes) -kidney disease -liver disease -malnourished, poor nutrition -recent or ongoing radiation therapy -an unusual or allergic reaction to fluorouracil, other chemotherapy, other medicines, foods, dyes, or preservatives -pregnant or trying to get pregnant -breast-feeding How should I use this medicine? This drug is given as an infusion or injection into a vein. It is administered in a hospital or clinic by a specially trained health care  professional. Talk to your pediatrician regarding the use of this medicine in children. Special care may be needed. Overdosage: If you think you have taken too much of this medicine contact a poison control center or emergency room at once. NOTE: This medicine is only for you. Do not share this medicine with others. What if I miss a dose? It is important not to miss your dose. Call your doctor or health care professional if you are unable to keep an appointment. What may interact with this medicine? -allopurinol -cimetidine -dapsone -digoxin -hydroxyurea -leucovorin -levamisole -medicines for seizures like ethotoin, fosphenytoin, phenytoin -medicines to increase blood counts like filgrastim, pegfilgrastim, sargramostim -medicines that treat or prevent blood clots like warfarin, enoxaparin, and dalteparin -methotrexate -metronidazole -pyrimethamine -some other chemotherapy drugs like busulfan, cisplatin, estramustine, vinblastine -trimethoprim -trimetrexate -vaccines Talk to your doctor or health care professional before taking any of these medicines: -acetaminophen -aspirin -ibuprofen -ketoprofen -naproxen This list may not describe all possible interactions. Give your health care provider a list of all the medicines, herbs, non-prescription drugs, or dietary supplements you use. Also tell them if you smoke, drink alcohol, or use illegal drugs. Some items may interact with your medicine. What should I watch for while using this medicine? Visit your doctor for checks on your progress. This drug may make you feel generally unwell. This is not uncommon, as chemotherapy can affect healthy cells as well as cancer cells. Report any side effects. Continue your course of treatment even though you feel ill unless your doctor tells you to stop. In some cases, you may be given additional medicines to help with side effects. Follow all directions for their use. Call your doctor or health care  professional for advice if you get a fever, chills or sore throat, or other symptoms of a cold or flu. Do not treat yourself. This drug decreases your body's ability to fight infections. Try to avoid being around people who are sick. This medicine may increase your risk to bruise or bleed. Call your doctor  or health care professional if you notice any unusual bleeding. Be careful brushing and flossing your teeth or using a toothpick because you may get an infection or bleed more easily. If you have any dental work done, tell your dentist you are receiving this medicine. Avoid taking products that contain aspirin, acetaminophen, ibuprofen, naproxen, or ketoprofen unless instructed by your doctor. These medicines may hide a fever. Do not become pregnant while taking this medicine. Women should inform their doctor if they wish to become pregnant or think they might be pregnant. There is a potential for serious side effects to an unborn child. Talk to your health care professional or pharmacist for more information. Do not breast-feed an infant while taking this medicine. Men should inform their doctor if they wish to father a child. This medicine may lower sperm counts. Do not treat diarrhea with over the counter products. Contact your doctor if you have diarrhea that lasts more than 2 days or if it is severe and watery. This medicine can make you more sensitive to the sun. Keep out of the sun. If you cannot avoid being in the sun, wear protective clothing and use sunscreen. Do not use sun lamps or tanning beds/booths. What side effects may I notice from receiving this medicine? Side effects that you should report to your doctor or health care professional as soon as possible: -allergic reactions like skin rash, itching or hives, swelling of the face, lips, or tongue -low blood counts - this medicine may decrease the number of white blood cells, red blood cells and platelets. You may be at increased risk for  infections and bleeding. -signs of infection - fever or chills, cough, sore throat, pain or difficulty passing urine -signs of decreased platelets or bleeding - bruising, pinpoint red spots on the skin, black, tarry stools, blood in the urine -signs of decreased red blood cells - unusually weak or tired, fainting spells, lightheadedness -breathing problems -changes in vision -chest pain -mouth sores -nausea and vomiting -pain, swelling, redness at site where injected -pain, tingling, numbness in the hands or feet -redness, swelling, or sores on hands or feet -stomach pain -unusual bleeding Side effects that usually do not require medical attention (report to your doctor or health care professional if they continue or are bothersome): -changes in finger or toe nails -diarrhea -dry or itchy skin -hair loss -headache -loss of appetite -sensitivity of eyes to the light -stomach upset -unusually teary eyes This list may not describe all possible side effects. Call your doctor for medical advice about side effects. You may report side effects to FDA at 1-800-FDA-1088. Where should I keep my medicine? This drug is given in a hospital or clinic and will not be stored at home. NOTE: This sheet is a summary. It may not cover all possible information. If you have questions about this medicine, talk to your doctor, pharmacist, or health care provider.  2015, Elsevier/[employer] Standard. (2008-04-20 13:53:16) Pegfilgrastim injection What is this medicine? PEGFILGRASTIM (peg fil GRA stim) is a long-acting granulocyte colony-stimulating factor that stimulates the growth of neutrophils, a type of white blood cell important in the body's fight against infection. It is used to reduce the incidence of fever and infection in patients with certain types of cancer who are receiving chemotherapy that affects the bone marrow. This medicine may be used for other purposes; ask your health care provider or  pharmacist if you have questions. COMMON BRAND NAME(S): Neulasta What should I tell my health care provider  before I take this medicine? They need to know if you have any of these conditions: -latex allergy -ongoing radiation therapy -sickle cell disease -skin reactions to acrylic adhesives (On-Body Injector only) -an unusual or allergic reaction to pegfilgrastim, filgrastim, other medicines, foods, dyes, or preservatives -pregnant or trying to get pregnant -breast-feeding How should I use this medicine? This medicine is for injection under the skin. If you get this medicine at home, you will be taught how to prepare and give the pre-filled syringe or how to use the On-body Injector. Refer to the patient Instructions for Use for detailed instructions. Use exactly as directed. Take your medicine at regular intervals. Do not take your medicine more often than directed. It is important that you put your used needles and syringes in a special sharps container. Do not put them in a trash can. If you do not have a sharps container, call your pharmacist or healthcare provider to get one. Talk to your pediatrician regarding the use of this medicine in children. Special care may be needed. Overdosage: If you think you have taken too much of this medicine contact a poison control center or emergency room at once. NOTE: This medicine is only for you. Do not share this medicine with others. What if I miss a dose? It is important not to miss your dose. Call your doctor or health care professional if you miss your dose. If you miss a dose due to an On-body Injector failure or leakage, a new dose should be administered as soon as possible using a single prefilled syringe for manual use. What may interact with this medicine? Interactions have not been studied. Give your health care provider a list of all the medicines, herbs, non-prescription drugs, or dietary supplements you use. Also tell them if you smoke,  drink alcohol, or use illegal drugs. Some items may interact with your medicine. This list may not describe all possible interactions. Give your health care provider a list of all the medicines, herbs, non-prescription drugs, or dietary supplements you use. Also tell them if you smoke, drink alcohol, or use illegal drugs. Some items may interact with your medicine. What should I watch for while using this medicine? You may need blood work done while you are taking this medicine. If you are going to need a MRI, CT scan, or other procedure, tell your doctor that you are using this medicine (On-Body Injector only). What side effects may I notice from receiving this medicine? Side effects that you should report to your doctor or health care professional as soon as possible: -allergic reactions like skin rash, itching or hives, swelling of the face, lips, or tongue -dizziness -fever -pain, redness, or irritation at site where injected -pinpoint red spots on the skin -shortness of breath or breathing problems -stomach or side pain, or pain at the shoulder -swelling -tiredness -trouble passing urine Side effects that usually do not require medical attention (report to your doctor or health care professional if they continue or are bothersome): -bone pain -muscle pain This list may not describe all possible side effects. Call your doctor for medical advice about side effects. You may report side effects to FDA at 1-800-FDA-1088. Where should I keep my medicine? Keep out of the reach of children. Store pre-filled syringes in a refrigerator between 2 and 8 degrees C (36 and 46 degrees F). Do not freeze. Keep in carton to protect from light. Throw away this medicine if it is left out of the refrigerator for  more than 48 hours. Throw away any unused medicine after the expiration date. NOTE: This sheet is a summary. It may not cover all possible information. If you have questions about this medicine, talk  to your doctor, pharmacist, or health care provider.  2015, [employer] Standard. (2014-03-17 16:14:05) Dexamethasone injection What is this medicine? DEXAMETHASONE (dex a METH a sone) is a corticosteroid. It is used to treat inflammation of the skin, joints, lungs, and other organs. Common conditions treated include asthma, allergies, and arthritis. It is also used for other conditions, like blood disorders and diseases of the adrenal glands. This medicine may be used for other purposes; ask your health care provider or pharmacist if you have questions. COMMON BRAND NAME(S): Decadron, Solurex What should I tell my health care provider before I take this medicine? They need to know if you have any of these conditions: -blood clotting problems -Cushing's syndrome -diabetes -glaucoma -heart problems or disease -high blood pressure -infection like herpes, measles, tuberculosis, or chickenpox -kidney disease -liver disease -mental problems -myasthenia gravis -osteoporosis -previous heart attack -seizures -stomach, ulcer or intestine disease including colitis and diverticulitis -thyroid problem -an unusual or allergic reaction to dexamethasone, corticosteroids, other medicines, lactose, foods, dyes, or preservatives -pregnant or trying to get pregnant -breast-feeding How should I use this medicine? This medicine is for injection into a muscle, joint, lesion, soft tissue, or vein. It is given by a health care professional in a hospital or clinic setting. Talk to your pediatrician regarding the use of this medicine in children. Special care may be needed. Overdosage: If you think you have taken too much of this medicine contact a poison control center or emergency room at once. NOTE: This medicine is only for you. Do not share this medicine with others. What if I miss a dose? This may not apply. If you are having a series of injections over a prolonged period, try not to miss an  appointment. Call your doctor or health care professional to reschedule if you are unable to keep an appointment. What may interact with this medicine? Do not take this medicine with any of the following medications: -mifepristone, RU-486 -vaccines This medicine may also interact with the following medications: -amphotericin B -antibiotics like clarithromycin, erythromycin, and troleandomycin -aspirin and aspirin-like drugs -barbiturates like phenobarbital -carbamazepine -cholestyramine -cholinesterase inhibitors like donepezil, galantamine, rivastigmine, and tacrine -cyclosporine -digoxin -diuretics -ephedrine -male hormones, like estrogens or progestins and birth control pills -indinavir -isoniazid -ketoconazole -medicines for diabetes -medicines that improve muscle tone or strength for conditions like myasthenia gravis -NSAIDs, medicines for pain and inflammation, like ibuprofen or naproxen -phenytoin -rifampin -thalidomide -warfarin This list may not describe all possible interactions. Give your health care provider a list of all the medicines, herbs, non-prescription drugs, or dietary supplements you use. Also tell them if you smoke, drink alcohol, or use illegal drugs. Some items may interact with your medicine. What should I watch for while using this medicine? Your condition will be monitored carefully while you are receiving this medicine. If you are taking this medicine for a long time, carry an identification card with your name and address, the type and dose of your medicine, and your doctor's name and address. This medicine may increase your risk of getting an infection. Stay away from people who are sick. Tell your doctor or health care professional if you are around anyone with measles or chickenpox. Talk to your health care provider before you get any vaccines that you take this medicine. If you  are going to have surgery, tell your doctor or health care professional  that you have taken this medicine within the last twelve months. Ask your doctor or health care professional about your diet. You may need to lower the amount of salt you eat. The medicine can increase your blood sugar. If you are a diabetic check with your doctor if you need help adjusting the dose of your diabetic medicine. What side effects may I notice from receiving this medicine? Side effects that you should report to your doctor or health care professional as soon as possible: -allergic reactions like skin rash, itching or hives, swelling of the face, lips, or tongue -black or tarry stools -change in the amount of urine -changes in vision -confusion, excitement, restlessness, a false sense of well-being -fever, sore throat, sneezing, cough, or other signs of infection, wounds that will not heal -hallucinations -increased thirst -mental depression, mood swings, mistaken feelings of self importance or of being mistreated -pain in hips, back, ribs, arms, shoulders, or legs -pain, redness, or irritation at the injection site -redness, blistering, peeling or loosening of the skin, including inside the mouth -rounding out of face -swelling of feet or lower legs -unusual bleeding or bruising -unusual tired or weak -wounds that do not heal Side effects that usually do not require medical attention (report to your doctor or health care professional if they continue or are bothersome): -diarrhea or constipation -change in taste -headache -nausea, vomiting -skin problems, acne, thin and shiny skin -touble sleeping -unusual growth of hair on the face or body -weight gain This list may not describe all possible side effects. Call your doctor for medical advice about side effects. You may report side effects to FDA at 1-800-FDA-1088. Where should I keep my medicine? This drug is given in a hospital or clinic and will not be stored at home. NOTE: This sheet is a summary. It may not cover  all possible information. If you have questions about this medicine, talk to your doctor, pharmacist, or health care provider.  2015, Elsevier/Gold Standard. (2008-04-07 14:04:12) Ondansetron injection What is this medicine? ONDANSETRON (on DAN se tron) is used to treat nausea and vomiting caused by chemotherapy. It is also used to prevent or treat nausea and vomiting after surgery. This medicine may be used for other purposes; ask your health care provider or pharmacist if you have questions. COMMON BRAND NAME(S): Zofran What should I tell my health care provider before I take this medicine? They need to know if you have any of these conditions: -heart disease -history of irregular heartbeat -liver disease -low levels of magnesium or potassium in the blood -an unusual or allergic reaction to ondansetron, granisetron, other medicines, foods, dyes, or preservatives -pregnant or trying to get pregnant -breast-feeding How should I use this medicine? This medicine is for infusion into a vein. It is given by a health care professional in a hospital or clinic setting. Talk to your pediatrician regarding the use of this medicine in children. Special care may be needed. Overdosage: If you think you have taken too much of this medicine contact a poison control center or emergency room at once. NOTE: This medicine is only for you. Do not share this medicine with others. What if I miss a dose? This does not apply. What may interact with this medicine? Do not take this medicine with any of the following medications: -apomorphine -certain medicines for fungal infections like fluconazole, itraconazole, ketoconazole, posaconazole, voriconazole -cisapride -dofetilide -dronedarone -pimozide -thioridazine -ziprasidone  This medicine may also interact with the following medications: -carbamazepine -certain medicines for depression, anxiety, or psychotic disturbances -fentanyl -linezolid -MAOIs  like Carbex, Eldepryl, Marplan, Nardil, and Parnate -methylene blue (injected into a vein) -other medicines that prolong the QT interval (cause an abnormal heart rhythm) -phenytoin -rifampicin -tramadol This list may not describe all possible interactions. Give your health care provider a list of all the medicines, herbs, non-prescription drugs, or dietary supplements you use. Also tell them if you smoke, drink alcohol, or use illegal drugs. Some items may interact with your medicine. What should I watch for while using this medicine? Your condition will be monitored carefully while you are receiving this medicine. What side effects may I notice from receiving this medicine? Side effects that you should report to your doctor or health care professional as soon as possible: -allergic reactions like skin rash, itching or hives, swelling of the face, lips, or tongue -breathing problems -confusion -dizziness -fast or irregular heartbeat -feeling faint or lightheaded, falls -fever and chills -loss of balance or coordination -seizures -sweating -swelling of the hands and feet -tightness in the chest -tremors -unusually weak or tired Side effects that usually do not require medical attention (report to your doctor or health care professional if they continue or are bothersome): -constipation or diarrhea -headache This list may not describe all possible side effects. Call your doctor for medical advice about side effects. You may report side effects to FDA at 1-800-FDA-1088. Where should I keep my medicine? This drug is given in a hospital or clinic and will not be stored at home. NOTE: This sheet is a summary. It may not cover all possible information. If you have questions about this medicine, talk to your doctor, pharmacist, or health care provider.  2015, [employer] Standard. (2013-09-22 16:18:28) Prochlorperazine tablets What is this medicine? PROCHLORPERAZINE (proe klor PER a  zeen) helps to control severe nausea and vomiting. This medicine is also used to treat schizophrenia. It can also help patients who experience anxiety that is not due to psychological illness. This medicine may be used for other purposes; ask your health care provider or pharmacist if you have questions. COMMON BRAND NAME(S): Compazine What should I tell my health care provider before I take this medicine? They need to know if you have any of these conditions: -blood disorders or disease -dementia -liver disease or jaundice -Parkinson's disease -uncontrollable movement disorder -an unusual or allergic reaction to prochlorperazine, other medicines, foods, dyes, or preservatives -pregnant or trying to get pregnant -breast-feeding How should I use this medicine? Take this medicine by mouth with a glass of water. Follow the directions on the prescription label. Take your doses at regular intervals. Do not take your medicine more often than directed. Do not stop taking this medicine suddenly. This can cause nausea, vomiting, and dizziness. Ask your doctor or health care professional for advice. Talk to your pediatrician regarding the use of this medicine in children. Special care may be needed. While this drug may be prescribed for children as young as 2 years for selected conditions, precautions do apply. Overdosage: If you think you have taken too much of this medicine contact a poison control center or emergency room at once. NOTE: This medicine is only for you. Do not share this medicine with others. What if I miss a dose? If you miss a dose, take it as soon as you can. If it is almost time for your next dose, take only that dose. Do not take double  or extra doses. What may interact with this medicine? Do not take this medicine with any of the following medications: -amoxapine -antidepressants like citalopram, escitalopram, fluoxetine, paroxetine, and  sertraline -deferoxamine -dofetilide -maprotiline -tricyclic antidepressants like amitriptyline, clomipramine, imipramine, nortiptyline and others This medicine may also interact with the following medications: -lithium -medicines for pain -phenytoin -propranolol -warfarin This list may not describe all possible interactions. Give your health care provider a list of all the medicines, herbs, non-prescription drugs, or dietary supplements you use. Also tell them if you smoke, drink alcohol, or use illegal drugs. Some items may interact with your medicine. What should I watch for while using this medicine? Visit your doctor or health care professional for regular checks on your progress. You may get drowsy or dizzy. Do not drive, use machinery, or do anything that needs mental alertness until you know how this medicine affects you. Do not stand or sit up quickly, especially if you are an older patient. This reduces the risk of dizzy or fainting spells. Alcohol may interfere with the effect of this medicine. Avoid alcoholic drinks. This medicine can reduce the response of your body to heat or cold. Dress warm in cold weather and stay hydrated in hot weather. If possible, avoid extreme temperatures like saunas, hot tubs, very hot or cold showers, or activities that can cause dehydration such as vigorous exercise. This medicine can make you more sensitive to the sun. Keep out of the sun. If you cannot avoid being in the sun, wear protective clothing and use sunscreen. Do not use sun lamps or tanning beds/booths. Your mouth may get dry. Chewing sugarless gum or sucking hard candy, and drinking plenty of water may help. Contact your doctor if the problem does not go away or is severe. What side effects may I notice from receiving this medicine? Side effects that you should report to your doctor or health care professional as soon as possible: -blurred vision -breast enlargement in men or women -breast  milk in women who are not breast-feeding -chest pain, fast or irregular heartbeat -confusion, restlessness -dark yellow or brown urine -difficulty breathing or swallowing -dizziness or fainting spells -drooling, shaking, movement difficulty (shuffling walk) or rigidity -fever, chills, sore throat -involuntary or uncontrollable movements of the eyes, mouth, head, arms, and legs -seizures -stomach area pain -unusually weak or tired -unusual bleeding or bruising -yellowing of skin or eyes Side effects that usually do not require medical attention (report to your doctor or health care professional if they continue or are bothersome): -difficulty passing urine -difficulty sleeping -headache -sexual dysfunction -skin rash, or itching This list may not describe all possible side effects. Call your doctor for medical advice about side effects. You may report side effects to FDA at 1-800-FDA-1088. Where should I keep my medicine? Keep out of the reach of children. Store at room temperature between 15 and 30 degrees C (59 and 86 degrees F). Protect from light. Throw away any unused medicine after the expiration date. NOTE: This sheet is a summary. It may not cover all possible information. If you have questions about this medicine, talk to your doctor, pharmacist, or health care provider.  2015, [employer] Standard. (2012-05-05 16:59:39) Ondansetron tablets What is this medicine? ONDANSETRON (on DAN se tron) is used to treat nausea and vomiting caused by chemotherapy. It is also used to prevent or treat nausea and vomiting after surgery. This medicine may be used for other purposes; ask your health care provider or pharmacist if you have questions.  COMMON BRAND NAME(S): Zofran What should I tell my health care provider before I take this medicine? They need to know if you have any of these conditions: -heart disease -history of irregular heartbeat -liver disease -low levels of magnesium  or potassium in the blood -an unusual or allergic reaction to ondansetron, granisetron, other medicines, foods, dyes, or preservatives -pregnant or trying to get pregnant -breast-feeding How should I use this medicine? Take this medicine by mouth with a glass of water. Follow the directions on your prescription label. Take your doses at regular intervals. Do not take your medicine more often than directed. Talk to your pediatrician regarding the use of this medicine in children. Special care may be needed. Overdosage: If you think you have taken too much of this medicine contact a poison control center or emergency room at once. NOTE: This medicine is only for you. Do not share this medicine with others. What if I miss a dose? If you miss a dose, take it as soon as you can. If it is almost time for your next dose, take only that dose. Do not take double or extra doses. What may interact with this medicine? Do not take this medicine with any of the following medications: -apomorphine -certain medicines for fungal infections like fluconazole, itraconazole, ketoconazole, posaconazole, voriconazole -cisapride -dofetilide -dronedarone -pimozide -thioridazine -ziprasidone This medicine may also interact with the following medications: -carbamazepine -certain medicines for depression, anxiety, or psychotic disturbances -fentanyl -linezolid -MAOIs like Carbex, Eldepryl, Marplan, Nardil, and Parnate -methylene blue (injected into a vein) -other medicines that prolong the QT interval (cause an abnormal heart rhythm) -phenytoin -rifampicin -tramadol This list may not describe all possible interactions. Give your health care provider a list of all the medicines, herbs, non-prescription drugs, or dietary supplements you use. Also tell them if you smoke, drink alcohol, or use illegal drugs. Some items may interact with your medicine. What should I watch for while using this medicine? Check with  your doctor or health care professional right away if you have any sign of an allergic reaction. What side effects may I notice from receiving this medicine? Side effects that you should report to your doctor or health care professional as soon as possible: -allergic reactions like skin rash, itching or hives, swelling of the face, lips or tongue -breathing problems -confusion -dizziness -fast or irregular heartbeat -feeling faint or lightheaded, falls -fever and chills -loss of balance or coordination -seizures -sweating -swelling of the hands or feet -tightness in the chest -tremors -unusually weak or tired Side effects that usually do not require medical attention (report to your doctor or health care professional if they continue or are bothersome): -constipation or diarrhea -headache This list may not describe all possible side effects. Call your doctor for medical advice about side effects. You may report side effects to FDA at 1-800-FDA-1088. Where should I keep my medicine? Keep out of the reach of children. Store between 2 and 30 degrees C (36 and 86 degrees F). Throw away any unused medicine after the expiration date. NOTE: This sheet is a summary. It may not cover all possible information. If you have questions about this medicine, talk to your doctor, pharmacist, or health care provider.  2015, [employer] Standard. (2013-09-22 16:27:45) Lidocaine; Prilocaine cream What is this medicine? LIDOCAINE; PRILOCAINE (LYE doe kane; PRIL oh kane) is a topical anesthetic that causes loss of feeling in the skin and surrounding tissues. It is used to numb the skin before procedures or injections. This  medicine may be used for other purposes; ask your health care provider or pharmacist if you have questions. COMMON BRAND NAME(S): EMLA What should I tell my health care provider before I take this medicine? They need to know if you have any of these conditions: -glucose-6-phosphate  deficiencies -heart disease -kidney or liver disease -methemoglobinemia -an unusual or allergic reaction to lidocaine, prilocaine, other medicines, foods, dyes, or preservatives -pregnant or trying to get pregnant -breast-feeding How should I use this medicine? This medicine is for external use only on the skin. Do not take by mouth. Follow the directions on the prescription label. Wash hands before and after use. Do not use more or leave in contact with the skin longer than directed. Do not apply to eyes or open wounds. It can cause irritation and blurred or temporary loss of vision. If this medicine comes in contact with your eyes, immediately rinse the eye with water. Do not touch or rub the eye. Contact your health care provider right away. Talk to your pediatrician regarding the use of this medicine in children. While this medicine may be prescribed for children for selected conditions, precautions do apply. Overdosage: If you think you have taken too much of this medicine contact a poison control center or emergency room at once. NOTE: This medicine is only for you. Do not share this medicine with others. What if I miss a dose? This medicine is usually only applied once prior to each procedure. It must be in contact with the skin for a period of time for it to work. If you applied this medicine later than directed, tell your health care professional before starting the procedure. What may interact with this medicine? -acetaminophen -chloroquine -dapsone -medicines to control heart rhythm -nitrates like nitroglycerin and nitroprusside -other ointments, creams, or sprays that may contain anesthetic medicine -phenobarbital -phenytoin -quinine -sulfonamides like sulfacetamide, sulfamethoxazole, sulfasalazine and others This list may not describe all possible interactions. Give your health care provider a list of all the medicines, herbs, non-prescription drugs, or dietary supplements you  use. Also tell them if you smoke, drink alcohol, or use illegal drugs. Some items may interact with your medicine. What should I watch for while using this medicine? Be careful to avoid injury to the treated area while it is numb and you are not aware of pain. Avoid scratching, rubbing, or exposing the treated area to hot or cold temperatures until complete sensation has returned. The numb feeling will wear off a few hours after applying the cream. What side effects may I notice from receiving this medicine? Side effects that you should report to your doctor or health care professional as soon as possible: -blurred vision -chest pain -difficulty breathing -dizziness -drowsiness -fast or irregular heartbeat -skin rash or itching -swelling of your throat, lips, or face -trembling Side effects that usually do not require medical attention (report to your doctor or health care professional if they continue or are bothersome): -changes in ability to feel hot or cold -redness and swelling at the application site This list may not describe all possible side effects. Call your doctor for medical advice about side effects. You may report side effects to FDA at 1-800-FDA-1088. Where should I keep my medicine? Keep out of reach of children. Store at room temperature between 15 and 30 degrees C (59 and 86 degrees F). Keep container tightly closed. Throw away any unused medicine after the expiration date. NOTE: This sheet is a summary. It may not cover all possible  information. If you have questions about this medicine, talk to your doctor, pharmacist, or health care provider.  2015, [employer]vier/Gold Standard. (2008-06-20 17:14:35)

## 2015-04-28 NOTE — Progress Notes (Signed)
Patient identified to be at risk for malnutrition on the MST secondary to weight loss and poor appetite  Contacted Pt by Phone  Wt Readings from Last 10 Encounters:  04/27/15 281 lb 3.2 oz (127.551 kg)  04/03/15 308 lb (139.708 kg)  03/15/15 308 lb 6.4 oz (139.889 kg)  03/13/15 304 lb (137.893 kg)  01/30/15 304 lb (137.893 kg)  01/17/15 304 lb (137.893 kg)  01/02/15 308 lb 11.2 oz (140.025 kg)  05/17/14 315 lb (142.883 kg)  04/04/14 314 lb (142.429 kg)  03/15/14 314 lb (142.429 kg)   Patient weight has Decreased by 30#s in 4 months  Patient reports oral intake as fair and is suffering from a reduced appetite.   Pt is s/p a colectomy a few weeks ago and has already transitioned to a regular diet. He says he might have an infection in one of his incisions which has slowed down his port placement and initiation of chemo. This also may be what is affecting his appetite.   Discussed with pt and wife what my role would be during patient's treatment. Wife was very hungry for information and wanted to know what she could expect for patient. Explained many of the side effects of chemotherapy that patients usually experience and how to cope with them. We talked about the importance of eating and maintaining weight/muscle during therapy. Wife noted that pt was overweight and was hoping he could lose some. Educated wife that chemotherapy treatment is not an appropriate time to constrict calories in effort to lose weight.   We discussed high protein foods, eating patterns, and the part protein supplements may play in pt's treatment.   Pt and wife were thankful for the call and mentioned they would like to meet with me at Pt's first chemo visit.  Mailed my contact info, coupons, and handouts titled "Nutrition and Your Well-Being", "Eloxatin", "Increasing Calories and Protein" and "Taste and Smell Changes"   Burtis Junes RD, LDN Nutrition Pager: [phone] 04/28/2015 10:43 AM

## 2015-04-28 NOTE — Progress Notes (Signed)
Appt for Dr Arnoldo Morale 5/3 at 1030.  Patient is aware of appointment.   They will set him up at that time to get port placed.

## 2015-05-03 NOTE — H&P (Signed)
[name] is an 69 y.o. male.   Chief Complaint: Colon carcinoma, need for central venous access HPI: Patient is a 69 year old white male with a history of atrial fibrillation who underwent a partial colectomy for colon cancer on 03/2015. He now is about to undergo chemotherapy and needs a Port-A-Cath placed.  Past Medical History  Diagnosis Date  . Varicose veins   . New onset atrial fibrillation 01/02/2015  . Morbid obesity 01/02/2015  . Hypertension   . Dysrhythmia     Afib- on set 12/2014  . Sleep apnea     been tested but has not received the CPAP yet  . GERD (gastroesophageal reflux disease)   . History of gout   . Cancer     colon    Past Surgical History  Procedure Laterality Date  . Vein ligation and stripping      left leg  . Colonoscopy N/A 03/28/2015    Procedure: COLONOSCOPY;  Surgeon: [name], MD;  Location: [facility];  Service: Endoscopy;  Laterality: N/A;  . Esophagogastroduodenoscopy N/A 03/28/2015    Procedure: ESOPHAGOGASTRODUODENOSCOPY (EGD);  Surgeon: [name], MD;  Location: [facility];  Service: Endoscopy;  Laterality: N/A;  . Esophageal dilation N/A 03/28/2015    Procedure: ESOPHAGEAL DILATION;  Surgeon: [name], MD;  Location: [facility];  Service: Endoscopy;  Laterality: N/A;  . Partial colectomy N/A 04/03/2015    Procedure: PARTIAL COLECTOMY;  Surgeon: [name], MD;  Location: [facility];  Service: General;  Laterality: N/A;    Family History  Problem Relation Age of Onset  . Cancer Mother     colon, age mid-4s  . Cancer Father     liver  . Heart disease Father   . Hyperlipidemia Father   . Cancer Sister     slow "blood" cancer  . Diabetes Sister    Social History:  reports that he has never smoked. He has never used smokeless tobacco. He reports that he does not drink alcohol or use illicit drugs.  Allergies: No Known Allergies  No prescriptions prior to admission    No results found for this or any  previous visit (from the past 48 hour(s)). No results found.  Review of Systems  Constitutional: Negative.   Eyes: Negative.   Respiratory: Negative.   Cardiovascular: Negative.   Gastrointestinal: Negative.   Genitourinary: Negative.     There were no vitals taken for this visit. Physical Exam  Constitutional: He is oriented to person, place, and time. He appears well-developed and well-nourished.  HENT:  Head: Normocephalic and atraumatic.  Neck: Normal range of motion. Neck supple.  Cardiovascular: Normal rate and normal heart sounds.   Irregular rhythm and rate.  Respiratory: Effort normal and breath sounds normal.  GI: Soft. Bowel sounds are normal. He exhibits no distension. There is no tenderness.  Neurological: He is alert and oriented to person, place, and time.  Skin: Skin is warm and dry.     Assessment/Plan Impression: Colon carcinoma, need for central venous access Plan: We'll place Port-A-Cath on 05/10/2015. The risks and benefits of the procedure including bleeding, infection, and pneumothorax were fully explained to the patient, who gave informed consent. Patient will stop his Eliquis 3 days prior to the procedure.  [place] A 05/03/2015, 11:15 AM

## 2015-05-04 ENCOUNTER — Encounter (HOSPITAL_COMMUNITY): Payer: Medicare Other | Attending: Hematology & Oncology

## 2015-05-04 ENCOUNTER — Other Ambulatory Visit (HOSPITAL_COMMUNITY): Payer: Self-pay | Admitting: Oncology

## 2015-05-04 DIAGNOSIS — R195 Other fecal abnormalities: Secondary | ICD-10-CM | POA: Insufficient documentation

## 2015-05-04 DIAGNOSIS — C189 Malignant neoplasm of colon, unspecified: Secondary | ICD-10-CM

## 2015-05-04 DIAGNOSIS — C187 Malignant neoplasm of sigmoid colon: Secondary | ICD-10-CM | POA: Insufficient documentation

## 2015-05-04 NOTE — Patient Instructions (Signed)
[name]  05/04/2015   Your procedure is scheduled on:  05/10/2015  Report to [facility] at  47  AM.  Call this number if you have problems the morning of surgery: [phone]   Remember:   Do not eat food or drink liquids after midnight.   Take these medicines the morning of surgery with A SIP OF WATER: lisinopril, prilosec, zofran, compazine   Do not wear jewelry, make-up or nail polish.  Do not wear lotions, powders, or perfumes.   Do not shave 48 hours prior to surgery. Men may shave face and neck.  Do not bring valuables to the hospital.  [facility] is not responsible for any belongings or valuables.               Contacts, dentures or bridgework may not be worn into surgery.  Leave suitcase in the car. After surgery it may be brought to your room.  For patients admitted to the hospital, discharge time is determined by your treatment team.               Patients discharged the day of surgery will not be allowed to drive home.  Name and phone number of your driver: family  Special Instructions: Shower using CHG 2 nights before surgery and the night before surgery.  If you shower the day of surgery use CHG.  Use special wash - you have one bottle of CHG for all showers.  You should use approximately 1/3 of the bottle for each shower.   Please read over the following fact sheets that you were given: Pain Booklet, Coughing and Deep Breathing, Surgical Site Infection Prevention, Anesthesia Post-op Instructions and Care and Recovery After Surgery Implanted Port Insertion An implanted port is a central line that has a round shape and is placed under the skin. It is used as a long-term IV access for:   Medicines, such as chemotherapy.   Fluids.   Liquid nutrition, such as total parenteral nutrition (TPN).   Blood samples.  LET [facility] CARE PROVIDER KNOW ABOUT:  Allergies to food or medicine.   Medicines taken, including vitamins, herbs, eye drops,  creams, and over-the-counter medicines.   Any allergies to heparin.  Use of steroids (by mouth or creams).   Previous problems with anesthetics or numbing medicines.   History of bleeding problems or blood clots.   Previous surgery.   Other health problems, including diabetes and kidney problems.   Possibility of pregnancy, if this applies. RISKS AND COMPLICATIONS Generally, this is a safe procedure. However, as with any procedure, problems can occur. Possible problems include:  Damage to the blood vessel, bruising, or bleeding at the puncture site.   Infection.  Blood clot in the vessel that the port is in.  Breakdown of the skin over your port.  Very rarely a person may develop a condition called a pneumothorax, a collection of air in the chest that may cause one of the lungs to collapse. The placement of these catheters with the appropriate imaging guidance significantly decreases the risk of a pneumothorax.  BEFORE THE PROCEDURE   Your health care provider may want you to have blood tests. These tests can help tell how well your kidneys and liver are working. They can also show how well your blood clots.   If you take blood thinners (anticoagulant medicines), ask your health care provider when you should stop taking them.   Make arrangements for someone  to drive you home. This is necessary if you have been sedated for your procedure.  PROCEDURE  Port insertion usually takes about 30-45 minutes.   An IV needle will be inserted in your arm. Medicine for pain and medicine to help relax you (sedative) will flow directly into your body through this needle.   You will lie on an exam table, and you will be connected to monitors to keep track of your heart rate, blood pressure, and breathing throughout the procedure.  An oxygen monitoring device may be attached to your finger. Oxygen will be given.   Everything will be kept as germ free (sterile) as possible during  the procedure. The skin near the point of the incision will be cleansed with antiseptic, and the area will be draped with sterile towels. The skin and deeper tissues over the port area will be made numb with a local anesthetic.  Two small cuts (incisions) will be made in the skin to insert the port. One will be made in the neck to obtain access to the vein where the catheter will lie.   Because the port reservoir will be placed under the skin, a small skin incision will be made in the upper chest, and a small pocket for the port will be made under the skin. The catheter that will be connected to the port tunnels to a large central vein in the chest. A small, raised area will remain on your body at the site of the reservoir when the procedure is complete.  The port placement will be done under imaging guidance to ensure the proper placement.  The reservoir has a silicone covering that can be punctured with a special needle.   The port will be flushed with normal saline, and blood will be drawn to make sure it is working properly.  There will be nothing remaining outside the skin when the procedure is finished.   Incisions will be held together by stitches, surgical glue, or a special tape. AFTER THE PROCEDURE  You will stay in a recovery area until the anesthesia has worn off. Your blood pressure and pulse will be checked.  A final chest X-ray will be taken to check the placement of the port and to ensure that there is no injury to your lung. Document Released: 10/06/2013 Document Revised: 05/02/2014 Document Reviewed: 10/06/2013 [facility] Patient Information 2015 [place], [place]. This information is not intended to replace advice given to you by your health care provider. Make sure you discuss any questions you have with your health care provider. PATIENT INSTRUCTIONS POST-ANESTHESIA  IMMEDIATELY FOLLOWING SURGERY:  Do not drive or operate machinery for the first twenty four hours after  surgery.  Do not make any important decisions for twenty four hours after surgery or while taking narcotic pain medications or sedatives.  If you develop intractable nausea and vomiting or a severe headache please notify your doctor immediately.  FOLLOW-UP:  Please make an appointment with your surgeon as instructed. You do not need to follow up with anesthesia unless specifically instructed to do so.  WOUND CARE INSTRUCTIONS (if applicable):  Keep a dry clean dressing on the anesthesia/puncture wound site if there is drainage.  Once the wound has quit draining you may leave it open to air.  Generally you should leave the bandage intact for twenty four hours unless there is drainage.  If the epidural site drains for more than 36-48 hours please call the anesthesia department.  QUESTIONS?:  Please feel free to  call your physician or the hospital operator if you have any questions, and they will be happy to assist you.

## 2015-05-04 NOTE — Progress Notes (Signed)
Chemo teaching done and consent signed for Oxaliplatin, Leucovorin, 5FU. Chemo calendar to be given to patient on 05/05/15. Patient signed papers with [employer] and papers faxed with ok result. Distress screening done. Chemo calendar given to patient and his wife.

## 2015-05-05 ENCOUNTER — Encounter (HOSPITAL_COMMUNITY): Payer: Self-pay

## 2015-05-05 ENCOUNTER — Encounter (HOSPITAL_COMMUNITY)
Admission: RE | Admit: 2015-05-05 | Discharge: 2015-05-05 | Disposition: A | Discharge status: Home / Self Care | Payer: Medicare Other | Source: Ambulatory Visit | Attending: General Surgery | Admitting: General Surgery

## 2015-05-05 ENCOUNTER — Other Ambulatory Visit: Payer: Self-pay

## 2015-05-05 DIAGNOSIS — Z01818 Encounter for other preprocedural examination: Secondary | ICD-10-CM | POA: Insufficient documentation

## 2015-05-05 DIAGNOSIS — C189 Malignant neoplasm of colon, unspecified: Secondary | ICD-10-CM | POA: Insufficient documentation

## 2015-05-05 LAB — BASIC METABOLIC PANEL
ANION GAP: 10 (ref 5–15)
BUN: 11 mg/dL (ref 6–20)
CO2: 23 mmol/L (ref 22–32)
Calcium: 8.7 mg/dL — ABNORMAL LOW (ref 8.9–10.3)
Chloride: 102 mmol/L (ref 101–111)
Creatinine, Ser: 0.97 mg/dL (ref 0.61–1.24)
GFR calc Af Amer: >60 mL/min (ref >60)
GFR calc non Af Amer: >60 mL/min (ref >60)
GLUCOSE: 126 mg/dL — AB (ref 70–99)
POTASSIUM: 4.1 mmol/L (ref 3.5–5.1)
Sodium: 135 mmol/L (ref 135–145)

## 2015-05-05 LAB — CBC
HEMATOCRIT: 41.8 % (ref 39.0–52.0)
HEMOGLOBIN: 14.4 g/dL (ref 13.0–17.0)
MCH: 31.9 pg (ref 26.0–34.0)
MCHC: 34.4 g/dL (ref 30.0–36.0)
MCV: 92.5 fL (ref 78.0–100.0)
Platelets: 148 10*3/uL — ABNORMAL LOW (ref 150–400)
RBC: 4.52 MIL/uL (ref 4.22–5.81)
RDW: 13.5 % (ref 11.5–15.5)
WBC: 9.2 10*3/uL (ref 4.0–10.5)

## 2015-05-10 ENCOUNTER — Encounter (HOSPITAL_COMMUNITY): Payer: Self-pay | Admitting: Anesthesiology

## 2015-05-10 ENCOUNTER — Ambulatory Visit (HOSPITAL_COMMUNITY): Payer: Medicare Other

## 2015-05-10 ENCOUNTER — Ambulatory Visit (HOSPITAL_COMMUNITY): Payer: Medicare Other | Admitting: Anesthesiology

## 2015-05-10 ENCOUNTER — Encounter: Payer: Self-pay | Admitting: *Deleted

## 2015-05-10 ENCOUNTER — Ambulatory Visit (HOSPITAL_COMMUNITY)
Admission: RE | Admit: 2015-05-10 | Discharge: 2015-05-10 | Disposition: A | Discharge status: Home / Self Care | Payer: Medicare Other | Source: Ambulatory Visit | Attending: General Surgery | Admitting: General Surgery

## 2015-05-10 ENCOUNTER — Encounter (HOSPITAL_COMMUNITY)
Admission: RE | Disposition: A | Discharge status: Home / Self Care | Payer: Self-pay | Source: Ambulatory Visit | Attending: General Surgery

## 2015-05-10 DIAGNOSIS — M109 Gout, unspecified: Secondary | ICD-10-CM | POA: Diagnosis not present

## 2015-05-10 DIAGNOSIS — I739 Peripheral vascular disease, unspecified: Secondary | ICD-10-CM | POA: Insufficient documentation

## 2015-05-10 DIAGNOSIS — Z800 Family history of malignant neoplasm of digestive organs: Secondary | ICD-10-CM | POA: Diagnosis not present

## 2015-05-10 DIAGNOSIS — I10 Essential (primary) hypertension: Secondary | ICD-10-CM | POA: Diagnosis not present

## 2015-05-10 DIAGNOSIS — Z9049 Acquired absence of other specified parts of digestive tract: Secondary | ICD-10-CM | POA: Diagnosis not present

## 2015-05-10 DIAGNOSIS — I4891 Unspecified atrial fibrillation: Secondary | ICD-10-CM | POA: Insufficient documentation

## 2015-05-10 DIAGNOSIS — K219 Gastro-esophageal reflux disease without esophagitis: Secondary | ICD-10-CM | POA: Insufficient documentation

## 2015-05-10 DIAGNOSIS — G4730 Sleep apnea, unspecified: Secondary | ICD-10-CM | POA: Insufficient documentation

## 2015-05-10 DIAGNOSIS — C189 Malignant neoplasm of colon, unspecified: Secondary | ICD-10-CM | POA: Insufficient documentation

## 2015-05-10 DIAGNOSIS — Z95828 Presence of other vascular implants and grafts: Secondary | ICD-10-CM

## 2015-05-10 HISTORY — PX: PORTACATH PLACEMENT: SHX2246

## 2015-05-10 SURGERY — INSERTION, TUNNELED CENTRAL VENOUS DEVICE, WITH PORT
Anesthesia: Monitor Anesthesia Care | Site: Chest

## 2015-05-10 MED ORDER — ONDANSETRON HCL 4 MG/2ML IJ SOLN: 4.0000 mg | Freq: Once | INTRAMUSCULAR | Status: DC | PRN

## 2015-05-10 MED ORDER — ONDANSETRON HCL 4 MG/2ML IJ SOLN
4.0000 mg | Freq: Once | INTRAMUSCULAR | Status: AC
  Administered 2015-05-10: 4 mg via INTRAVENOUS

## 2015-05-10 MED ORDER — CEFAZOLIN SODIUM-DEXTROSE 2-3 GM-% IV SOLR
INTRAVENOUS | Status: AC
  Filled 2015-05-10: qty 50

## 2015-05-10 MED ORDER — MIDAZOLAM HCL 2 MG/2ML IJ SOLN
INTRAMUSCULAR | Status: AC
  Filled 2015-05-10: qty 2

## 2015-05-10 MED ORDER — MIDAZOLAM HCL 5 MG/5ML IJ SOLN
INTRAMUSCULAR | Status: DC | PRN
  Administered 2015-05-10: 1 mg via INTRAVENOUS

## 2015-05-10 MED ORDER — HEPARIN SOD (PORK) LOCK FLUSH 100 UNIT/ML IV SOLN
INTRAVENOUS | Status: AC
  Filled 2015-05-10: qty 5

## 2015-05-10 MED ORDER — HEPARIN SOD (PORK) LOCK FLUSH 100 UNIT/ML IV SOLN
INTRAVENOUS | Status: DC | PRN
Start: 2015-05-10 — End: 2015-05-10
  Administered 2015-05-10: 500 [IU] via INTRAVENOUS

## 2015-05-10 MED ORDER — CEFAZOLIN SODIUM 1-5 GM-% IV SOLN
1.0000 g | Freq: Once | INTRAVENOUS | Status: AC
  Administered 2015-05-10: 1 g via INTRAVENOUS

## 2015-05-10 MED ORDER — MIDAZOLAM HCL 2 MG/2ML IJ SOLN
1.0000 mg | INTRAMUSCULAR | Status: DC | PRN
Start: 2015-05-10 — End: 2015-05-10
  Administered 2015-05-10: 2 mg via INTRAVENOUS

## 2015-05-10 MED ORDER — FENTANYL CITRATE (PF) 100 MCG/2ML IJ SOLN
INTRAMUSCULAR | Status: AC
  Filled 2015-05-10: qty 2

## 2015-05-10 MED ORDER — CEFAZOLIN SODIUM-DEXTROSE 2-3 GM-% IV SOLR
2.0000 g | Freq: Once | INTRAVENOUS | Status: AC
  Administered 2015-05-10: 2 g via INTRAVENOUS

## 2015-05-10 MED ORDER — ONDANSETRON HCL 4 MG/2ML IJ SOLN
INTRAMUSCULAR | Status: AC
  Filled 2015-05-10: qty 2

## 2015-05-10 MED ORDER — CEFAZOLIN SODIUM 1-5 GM-% IV SOLN
INTRAVENOUS | Status: AC
  Filled 2015-05-10: qty 50

## 2015-05-10 MED ORDER — SODIUM CHLORIDE 0.9 % IV SOLN
INTRAVENOUS | Status: DC | PRN
  Administered 2015-05-10: 500 mL via INTRAMUSCULAR

## 2015-05-10 MED ORDER — PROPOFOL 10 MG/ML IV BOLUS
INTRAVENOUS | Status: DC | PRN
  Administered 2015-05-10: 20 mg via INTRAVENOUS

## 2015-05-10 MED ORDER — FENTANYL CITRATE (PF) 100 MCG/2ML IJ SOLN: 25.0000 ug | INTRAMUSCULAR | Status: DC | PRN

## 2015-05-10 MED ORDER — DEXTROSE 5 % IV SOLN: 3.0000 g | INTRAVENOUS | Status: DC

## 2015-05-10 MED ORDER — FENTANYL CITRATE (PF) 100 MCG/2ML IJ SOLN
25.0000 ug | INTRAMUSCULAR | Status: AC
  Administered 2015-05-10 (×2): 25 ug via INTRAVENOUS

## 2015-05-10 MED ORDER — LIDOCAINE HCL (PF) 1 % IJ SOLN
INTRAMUSCULAR | Status: AC
  Filled 2015-05-10: qty 30

## 2015-05-10 MED ORDER — PROPOFOL INFUSION 10 MG/ML OPTIME
INTRAVENOUS | Status: DC | PRN
  Administered 2015-05-10: 75 ug/kg/min via INTRAVENOUS

## 2015-05-10 MED ORDER — FENTANYL CITRATE (PF) 250 MCG/5ML IJ SOLN
INTRAMUSCULAR | Status: DC | PRN
  Administered 2015-05-10 (×2): 25 ug via INTRAVENOUS

## 2015-05-10 MED ORDER — CHLORHEXIDINE GLUCONATE 4 % EX LIQD: 1.0000 "application " | Freq: Once | CUTANEOUS | Status: DC

## 2015-05-10 MED ORDER — LACTATED RINGERS IV SOLN
INTRAVENOUS | Status: DC
  Administered 2015-05-10: 1000 mL via INTRAVENOUS

## 2015-05-10 MED ORDER — KETOROLAC TROMETHAMINE 30 MG/ML IJ SOLN
30.0000 mg | Freq: Once | INTRAMUSCULAR | Status: AC
  Administered 2015-05-10: 30 mg via INTRAVENOUS
  Filled 2015-05-10: qty 1

## 2015-05-10 MED ORDER — PROPOFOL 10 MG/ML IV BOLUS
INTRAVENOUS | Status: AC
  Filled 2015-05-10: qty 20

## 2015-05-10 MED ORDER — LIDOCAINE HCL (PF) 1 % IJ SOLN
INTRAMUSCULAR | Status: DC | PRN
  Administered 2015-05-10: 10 mL

## 2015-05-10 SURGICAL SUPPLY — 35 items
APPLIER CLIP 9.375 SM OPEN (CLIP)
BAG DECANTER FOR FLEXI CONT (MISCELLANEOUS) ×2 IMPLANT
BAG HAMPER (MISCELLANEOUS) ×2 IMPLANT
CATH HICKMAN DUAL 12.0 (CATHETERS) IMPLANT
CLIP APPLIE 9.375 SM OPEN (CLIP) IMPLANT
CLOTH BEACON ORANGE TIMEOUT ST (SAFETY) ×2 IMPLANT
COVER LIGHT HANDLE STERIS (MISCELLANEOUS) ×4 IMPLANT
DECANTER SPIKE VIAL GLASS SM (MISCELLANEOUS) ×2 IMPLANT
DRAPE C-ARM FOLDED MOBILE STRL (DRAPES) ×2 IMPLANT
DURAPREP 6ML APPLICATOR 50/CS (WOUND CARE) ×2 IMPLANT
ELECT REM PT RETURN 9FT ADLT (ELECTROSURGICAL) ×2
ELECTRODE REM PT RTRN 9FT ADLT (ELECTROSURGICAL) ×1 IMPLANT
GLOVE INDICATOR 7.0 STRL GRN (GLOVE) ×2 IMPLANT
GLOVE SS BIOGEL STRL SZ 6.5 (GLOVE) ×1 IMPLANT
GLOVE SUPERSENSE BIOGEL SZ 6.5 (GLOVE) ×1
GLOVE SURG SS PI 7.5 STRL IVOR (GLOVE) ×4 IMPLANT
GOWN STRL REUS W/TWL LRG LVL3 (GOWN DISPOSABLE) ×4 IMPLANT
IV NS 500ML (IV SOLUTION) ×1
IV NS 500ML BAXH (IV SOLUTION) ×1 IMPLANT
KIT PORT POWER 8FR ISP MRI (CATHETERS) ×2 IMPLANT
KIT ROOM TURNOVER APOR (KITS) ×2 IMPLANT
LIQUID BAND (GAUZE/BANDAGES/DRESSINGS) ×2 IMPLANT
MANIFOLD NEPTUNE II (INSTRUMENTS) ×2 IMPLANT
NEEDLE HYPO 25X1 1.5 SAFETY (NEEDLE) ×2 IMPLANT
PACK MINOR (CUSTOM PROCEDURE TRAY) ×2 IMPLANT
PAD ARMBOARD 7.5X6 YLW CONV (MISCELLANEOUS) ×2 IMPLANT
SET BASIN LINEN APH (SET/KITS/TRAYS/PACK) ×2 IMPLANT
SET INTRODUCER 12FR PACEMAKER (SHEATH) IMPLANT
SHEATH COOK PEEL AWAY SET 8F (SHEATH) IMPLANT
SUT PROLENE 3 0 PS 2 (SUTURE) IMPLANT
SUT VIC AB 3-0 SH 27 (SUTURE) ×1
SUT VIC AB 3-0 SH 27X BRD (SUTURE) ×1 IMPLANT
SUT VIC AB 4-0 PS2 27 (SUTURE) ×2 IMPLANT
SYR 20CC LL (SYRINGE) ×2 IMPLANT
SYRINGE 10CC LL (SYRINGE) ×4 IMPLANT

## 2015-05-10 NOTE — Anesthesia Postprocedure Evaluation (Signed)
  Anesthesia Post-op Note  Patient: [name]  Procedure(s) Performed: Procedure(s) with comments: INSERTION PORT-A-CATH (N/A) - left subclavian  Patient Location: PACU  Anesthesia Type:MAC  Level of Consciousness: awake, alert  and oriented  Airway and Oxygen Therapy: Patient Spontanous Breathing  Post-op Pain: none  Post-op Assessment: Post-op Vital signs reviewed, Patient's Cardiovascular Status Stable, Respiratory Function Stable, Patent Airway and No signs of Nausea or vomiting  Post-op Vital Signs: Reviewed and stable  Last Vitals:  Filed Vitals:   05/10/15 1134  BP: 110/70  Pulse: 57  Temp: 36.4 C  Resp: 16    Complications: No apparent anesthesia complications, late entry

## 2015-05-10 NOTE — Op Note (Signed)
Patient:  [name]  DOB:  [birth year]  MRN:  [id number]   Preop Diagnosis:  Colon carcinoma, need for central venous access  Postop Diagnosis:  Same  Procedure:  Port-A-Cath insertion  Surgeon:  Aviva Signs, M.D.  Anes:  Mac  Indications:  Patient is a 69 year old white male recently diagnosed with colon carcinoma who is about to undergo chemotherapy. He presents to [facility] for Port-A-Cath insertion. The risks and benefits of the procedure including bleeding, infection, and pneumothorax were fully explained to the patient, who gave informed consent.  Procedure note:  The patient was placed in the Trendelenburg position after the left upper chest was prepped and draped using usual sterile technique with DuraPrep. Surgical site confirmation was performed. 1% Xylocaine was used for local anesthesia.  An incision was made below the left clavicle. A subcutaneous pocket was then. A needle is advanced into the left subclavian vein using the Seldinger technique without difficulty. A guidewire was then advanced into the right atrium under fluoroscopic guidance. An introducer peel-away sheath were placed over the guidewire. The catheter was then inserted through the peel-away sheath the peel-away sheath was removed. The catheter was attached to the port and the port placed in subcutaneous pocket. Adequate positioning was confirmed by fluoroscopy. Good backflow was noted in the port. The port was flushed with heparin flush. The subcutaneous layer was reapproximated using 3-0 Vicryl interrupted suture. The skin was closed using a 4-0 Vicryl subcuticular suture. Liquiband was applied.  All tape and needle counts were correct the end of the procedure. Patient was transferred to PACU in stable condition. A chest x-ray will be performed at that time.  Complications:  None  EBL:  Minimal  Specimen:  None

## 2015-05-10 NOTE — Transfer of Care (Signed)
Immediate Anesthesia Transfer of Care Note  Patient: [name]  Procedure(s) Performed: Procedure(s) with comments: INSERTION PORT-A-CATH (N/A) - left subclavian  Patient Location: PACU  Anesthesia Type:MAC  Level of Consciousness: awake, alert  and oriented  Airway & Oxygen Therapy: Patient Spontanous Breathing  Post-op Assessment: Report given to RN  Post vital signs: Reviewed and stable  Last Vitals:  Filed Vitals:   05/10/15 0945  BP: 103/68  Pulse:   Temp:   Resp: 15    Complications: No apparent anesthesia complications

## 2015-05-10 NOTE — Progress Notes (Signed)
[facility] Psychosocial Distress Screening Clinical Social Work  Clinical Social Work was referred by distress screening protocol.  The patient scored a 6 on the Psychosocial Distress Thermometer which indicates moderate distress. Clinical Social Worker phoned pt to assess for distress and other psychosocial needs. CSW left message and will meet with pt at future appointments.   ONCBCN DISTRESS SCREENING 05/04/2015  Screening Type Initial Screening  Distress experienced in past week (1-10) 6  Emotional problem type Nervousness/Anxiety;Adjusting to illness;Isolation/feeling alone  Physical Problem type Getting around  Referral to clinical social work Yes    Clinical Social Worker follow up needed: Yes.     If yes, follow up plan: See above  Loren Racer, [place] Tuesdays 8:30-1pm Wednesdays 8:30-12pm  [phone]

## 2015-05-10 NOTE — Anesthesia Preprocedure Evaluation (Signed)
Anesthesia Evaluation  Patient identified by MRN, date of birth, ID band Patient awake    Reviewed: Allergy & Precautions, NPO status , Patient's Chart, lab work & pertinent test results  Airway Mallampati: III  TM Distance: >3 FB     Dental  (+) Poor Dentition, Missing, Loose, Chipped, Dental Advisory Given,    Pulmonary sleep apnea ,  breath sounds clear to auscultation        Cardiovascular hypertension, Pt. on medications + Peripheral Vascular Disease + dysrhythmias Atrial Fibrillation Rhythm:Regular Rate:Normal     Neuro/Psych    GI/Hepatic GERD-  Medicated,  Endo/Other  Morbid obesity  Renal/GU      Musculoskeletal   Abdominal (+) + obese,   Peds  Hematology   Anesthesia Other Findings   Reproductive/Obstetrics                             Anesthesia Physical Anesthesia Plan  ASA: III  Anesthesia Plan: MAC   Post-op Pain Management:    Induction: Intravenous  Airway Management Planned: Simple Face Mask  Additional Equipment:   Intra-op Plan:   Post-operative Plan:   Informed Consent: I have reviewed the patients History and Physical, chart, labs and discussed the procedure including the risks, benefits and alternatives for the proposed anesthesia with the patient or authorized representative who has indicated his/her understanding and acceptance.     Plan Discussed with:   Anesthesia Plan Comments:         Anesthesia Quick Evaluation

## 2015-05-10 NOTE — Interval H&P Note (Signed)
History and Physical Interval Note:  05/10/2015 9:31 AM  [name]  has presented today for surgery, with the diagnosis of colon cancer  The various methods of treatment have been discussed with the patient and family. After consideration of risks, benefits and other options for treatment, the patient has consented to  Procedure(s): INSERTION PORT-A-CATH (N/A) as a surgical intervention .  The patient's history has been reviewed, patient examined, no change in status, stable for surgery.  I have reviewed the patient's chart and labs.  Questions were answered to the patient's satisfaction.     Aviva Signs A

## 2015-05-10 NOTE — Discharge Instructions (Signed)
Implanted Port Home Guide °An implanted port is a type of central line that is placed under the skin. Central lines are used to provide IV access when treatment or nutrition needs to be given through a person's veins. Implanted ports are used for long-term IV access. An implanted port may be placed because:  °· You need IV medicine that would be irritating to the small veins in your hands or arms.   °· You need long-term IV medicines, such as antibiotics.   °· You need IV nutrition for a long period.   °· You need frequent blood draws for lab tests.   °· You need dialysis.   °Implanted ports are usually placed in the chest area, but they can also be placed in the upper arm, the abdomen, or the leg. An implanted port has two main parts:  °· Reservoir. The reservoir is round and will appear as a small, raised area under your skin. The reservoir is the part where a needle is inserted to give medicines or draw blood.   °· Catheter. The catheter is a thin, flexible tube that extends from the reservoir. The catheter is placed into a large vein. Medicine that is inserted into the reservoir goes into the catheter and then into the vein.   °HOW WILL I CARE FOR MY INCISION SITE? °Do not get the incision site wet. Bathe or shower as directed by your health care provider.  °HOW IS MY PORT ACCESSED? °Special steps must be taken to access the port:  °· Before the port is accessed, a numbing cream can be placed on the skin. This helps numb the skin over the port site.   °· Your health care provider uses a sterile technique to access the port. °¨ Your health care provider must put on a mask and sterile gloves. °¨ The skin over your port is cleaned carefully with an antiseptic and allowed to dry. °¨ The port is gently pinched between sterile gloves, and a needle is inserted into the port. °· Only "non-coring" port needles should be used to access the port. Once the port is accessed, a blood return should be checked. This helps  ensure that the port is in the vein and is not clogged.   °· If your port needs to remain accessed for a constant infusion, a clear (transparent) bandage will be placed over the needle site. The bandage and needle will need to be changed every week, or as directed by your health care provider.   °· Keep the bandage covering the needle clean and dry. Do not get it wet. Follow your health care provider's instructions on how to take a shower or bath while the port is accessed.   °· If your port does not need to stay accessed, no bandage is needed over the port.   °WHAT IS FLUSHING? °Flushing helps keep the port from getting clogged. Follow your health care provider's instructions on how and when to flush the port. Ports are usually flushed with saline solution or a medicine called heparin. The need for flushing will depend on how the port is used.  °· If the port is used for intermittent medicines or blood draws, the port will need to be flushed:   °¨ After medicines have been given.   °¨ After blood has been drawn.   °¨ As part of routine maintenance.   °· If a constant infusion is running, the port may not need to be flushed.   °HOW LONG WILL MY PORT STAY IMPLANTED? °The port can stay in for as long as your health care   provider thinks it is needed. When it is time for the port to come out, surgery will be done to remove it. The procedure is similar to the one performed when the port was put in.  °WHEN SHOULD I SEEK IMMEDIATE MEDICAL CARE? °When you have an implanted port, you should seek immediate medical care if:  °· You notice a bad smell coming from the incision site.   °· You have swelling, redness, or drainage at the incision site.   °· You have more swelling or pain at the port site or the surrounding area.   °· You have a fever that is not controlled with medicine. °Document Released: 12/16/2005 Document Revised: 10/06/2013 Document Reviewed: 08/23/2013 °ExitCare® Patient Information ©2015 [employer]. This  information is not intended to replace advice given to you by your health care provider. Make sure you discuss any questions you have with your health care provider. °Implanted Port Insertion, Care After °Refer to this sheet in the next few weeks. These instructions provide you with information on caring for yourself after your procedure. Your health care provider may also give you more specific instructions. Your treatment has been planned according to current medical practices, but problems sometimes occur. Call your health care provider if you have any problems or questions after your procedure. °WHAT TO EXPECT AFTER THE PROCEDURE °After your procedure, it is typical to have the following:  °· Discomfort at the port insertion site. Ice packs to the area will help. °· Bruising on the skin over the port. This will subside in 3-4 days. °HOME CARE INSTRUCTIONS °· After your port is placed, you will get a manufacturer's information card. The card has information about your port. Keep this card with you at all times.   °· Know what kind of port you have. There are many types of ports available.   °· Wear a medical alert bracelet in case of an emergency. This can help alert health care workers that you have a port.   °· The port can stay in for as long as your health care provider believes it is necessary.   °· A home health care nurse may give medicines and take care of the port.   °· You or a family member can get special training and directions for giving medicine and taking care of the port at home.   °SEEK MEDICAL CARE IF:  °· Your port does not flush or you are unable to get a blood return.   °· You have a fever or chills. °SEEK IMMEDIATE MEDICAL CARE IF: °· You have new fluid or pus coming from your incision.   °· You notice a bad smell coming from your incision site.   °· You have swelling, pain, or more redness at the incision or port site.   °· You have chest pain or shortness of breath. °Document Released:  10/06/2013 Document Revised: 12/21/2013 Document Reviewed: 10/06/2013 °ExitCare® Patient Information ©2015 [employer]. This information is not intended to replace advice given to you by your health care provider. Make sure you discuss any questions you have with your health care provider. ° °

## 2015-05-12 ENCOUNTER — Encounter (HOSPITAL_COMMUNITY): Payer: Self-pay | Admitting: General Surgery

## 2015-05-16 ENCOUNTER — Encounter (HOSPITAL_COMMUNITY): Payer: Medicare Other | Admitting: Oncology

## 2015-05-16 ENCOUNTER — Encounter (HOSPITAL_COMMUNITY): Payer: Self-pay | Admitting: Oncology

## 2015-05-16 ENCOUNTER — Encounter (HOSPITAL_BASED_OUTPATIENT_CLINIC_OR_DEPARTMENT_OTHER): Payer: Medicare Other

## 2015-05-16 VITALS — BP 109/67 | HR 68 | Temp 97.9°F | Resp 20

## 2015-05-16 VITALS — BP 113/74 | HR 88 | Temp 97.8°F | Resp 18 | Wt 285.0 lb

## 2015-05-16 DIAGNOSIS — C189 Malignant neoplasm of colon, unspecified: Secondary | ICD-10-CM

## 2015-05-16 DIAGNOSIS — R195 Other fecal abnormalities: Secondary | ICD-10-CM | POA: Diagnosis not present

## 2015-05-16 DIAGNOSIS — C186 Malignant neoplasm of descending colon: Secondary | ICD-10-CM

## 2015-05-16 DIAGNOSIS — C187 Malignant neoplasm of sigmoid colon: Secondary | ICD-10-CM | POA: Diagnosis not present

## 2015-05-16 DIAGNOSIS — Z5111 Encounter for antineoplastic chemotherapy: Secondary | ICD-10-CM

## 2015-05-16 LAB — COMPREHENSIVE METABOLIC PANEL
ALBUMIN: 2.8 g/dL — AB (ref 3.5–5.0)
ALT: 11 U/L — ABNORMAL LOW (ref 17–63)
ANION GAP: 8 (ref 5–15)
AST: 27 U/L (ref 15–41)
Alkaline Phosphatase: 78 U/L (ref 38–126)
BILIRUBIN TOTAL: 1 mg/dL (ref 0.3–1.2)
BUN: 9 mg/dL (ref 6–20)
CALCIUM: 8.4 mg/dL — AB (ref 8.9–10.3)
CHLORIDE: 102 mmol/L (ref 101–111)
CO2: 27 mmol/L (ref 22–32)
CREATININE: 0.92 mg/dL (ref 0.61–1.24)
GFR calc Af Amer: >60 mL/min (ref >60)
GFR calc non Af Amer: >60 mL/min (ref >60)
Glucose, Bld: 129 mg/dL — ABNORMAL HIGH (ref 65–99)
Potassium: 3.5 mmol/L (ref 3.5–5.1)
Sodium: 137 mmol/L (ref 135–145)
Total Protein: 6.7 g/dL (ref 6.5–8.1)

## 2015-05-16 LAB — CBC WITH DIFFERENTIAL/PLATELET
Basophils Absolute: 0 10*3/uL (ref 0.0–0.1)
Basophils Relative: 0 % (ref 0–1)
EOS ABS: 0.3 10*3/uL (ref 0.0–0.7)
Eosinophils Relative: 5 % (ref 0–5)
HCT: 37.3 % — ABNORMAL LOW (ref 39.0–52.0)
Hemoglobin: 12.7 g/dL — ABNORMAL LOW (ref 13.0–17.0)
Lymphocytes Relative: 20 % (ref 12–46)
Lymphs Abs: 1.5 10*3/uL (ref 0.7–4.0)
MCH: 31.8 pg (ref 26.0–34.0)
MCHC: 34 g/dL (ref 30.0–36.0)
MCV: 93.3 fL (ref 78.0–100.0)
MONOS PCT: 10 % (ref 3–12)
Monocytes Absolute: 0.7 10*3/uL (ref 0.1–1.0)
NEUTROS PCT: 65 % (ref 43–77)
Neutro Abs: 4.8 10*3/uL (ref 1.7–7.7)
PLATELETS: 165 10*3/uL (ref 150–400)
RBC: 4 MIL/uL — ABNORMAL LOW (ref 4.22–5.81)
RDW: 13.6 % (ref 11.5–15.5)
WBC: 7.4 10*3/uL (ref 4.0–10.5)

## 2015-05-16 MED ORDER — SODIUM CHLORIDE 0.9 % IV SOLN
Freq: Once | INTRAVENOUS | Status: AC
  Administered 2015-05-16: 10:00:00 via INTRAVENOUS
  Filled 2015-05-16: qty 4

## 2015-05-16 MED ORDER — DEXTROSE 5 % IV SOLN
Freq: Once | INTRAVENOUS | Status: AC
  Administered 2015-05-16: 10:00:00 via INTRAVENOUS

## 2015-05-16 MED ORDER — SODIUM CHLORIDE 0.9 % IJ SOLN
10.0000 mL | INTRAMUSCULAR | Status: DC | PRN
  Administered 2015-05-16: 10 mL
  Filled 2015-05-16: qty 10

## 2015-05-16 MED ORDER — LEUCOVORIN CALCIUM INJECTION 350 MG
400.0000 mg/m2 | Freq: Once | INTRAVENOUS | Status: AC
  Administered 2015-05-16: 972 mg via INTRAVENOUS
  Filled 2015-05-16: qty 48.6

## 2015-05-16 MED ORDER — FLUOROURACIL CHEMO INJECTION 5 GM/100ML
2400.0000 mg/m2 | INTRAVENOUS | Status: DC
  Administered 2015-05-16: 5850 mg via INTRAVENOUS
  Filled 2015-05-16: qty 117

## 2015-05-16 MED ORDER — FLUOROURACIL CHEMO INJECTION 2.5 GM/50ML
400.0000 mg/m2 | Freq: Once | INTRAVENOUS | Status: AC
  Administered 2015-05-16: 950 mg via INTRAVENOUS
  Filled 2015-05-16: qty 19

## 2015-05-16 MED ORDER — OXALIPLATIN CHEMO INJECTION 100 MG/20ML
85.0000 mg/m2 | Freq: Once | INTRAVENOUS | Status: AC
  Administered 2015-05-16: 205 mg via INTRAVENOUS
  Filled 2015-05-16: qty 41

## 2015-05-16 NOTE — Patient Instructions (Signed)
[place] at [facility] Discharge Instructions  RECOMMENDATIONS MADE BY THE CONSULTANT AND ANY TEST RESULTS WILL BE SENT TO YOUR REFERRING PHYSICIAN.  Please restart Eliquis tomorrow. We will see you next week as planned. Call for any questions.  Thank you for choosing [place] at [facility] to provide your oncology and hematology care.  To afford each patient quality time with our provider, please arrive at least 15 minutes before your scheduled appointment time.    You need to re-schedule your appointment should you arrive 10 or more minutes late.  We strive to give you quality time with our providers, and arriving late affects you and other patients whose appointments are after yours.  Also, if you no show three or more times for appointments you may be dismissed from the clinic at the providers discretion.     Again, thank you for choosing [facility].  Our hope is that these requests will decrease the amount of time that you wait before being seen by our physicians.       _____________________________________________________________  Should you have questions after your visit to [facility], please contact our office at [phone] between the hours of 8:30 a.m. and 4:30 p.m.  Voicemails left after 4:30 p.m. will not be returned until the following business day.  For prescription refill requests, have your pharmacy contact our office.

## 2015-05-16 NOTE — Patient Instructions (Signed)
[facility] Discharge Instructions for Patients Receiving Chemotherapy  Today you received the following chemotherapy agents Oxaliplatin, leucovorin and 5FU pump (Cycle 1 Day 1).  To help prevent nausea and vomiting after your treatment, we encourage you to take your nausea medication as instructed. If you develop nausea and vomiting that is not controlled by your nausea medication, call the clinic. If it is after clinic hours your family physician or the after hours number for the clinic or go to the Emergency Department. BELOW ARE SYMPTOMS THAT SHOULD BE REPORTED IMMEDIATELY:  *FEVER GREATER THAN 101.0 F  *CHILLS WITH OR WITHOUT FEVER  NAUSEA AND VOMITING THAT IS NOT CONTROLLED WITH YOUR NAUSEA MEDICATION  *UNUSUAL SHORTNESS OF BREATH  *UNUSUAL BRUISING OR BLEEDING  TENDERNESS IN MOUTH AND THROAT WITH OR WITHOUT PRESENCE OF ULCERS  *URINARY PROBLEMS  *BOWEL PROBLEMS  UNUSUAL RASH Items with * indicate a potential emergency and should be followed up as soon as possible.  Return to clinic Wed at 1130 am to have pump removed.  I have been informed and understand all the instructions given to me. I know to contact the clinic, my physician, or go to the Emergency Department if any problems should occur. I do not have any questions at this time, but understand that I may call the clinic during office hours or the Patient Navigator at [phone] should I have any questions or need assistance in obtaining follow up care.    __________________________________________  _____________  __________ Signature of Patient or Authorized Representative            Date                   Time    __________________________________________ Nurse's Signature

## 2015-05-16 NOTE — Assessment & Plan Note (Addendum)
Stage IIA adenocarcinoma of colon, S/P definitive surgery by Dr. Arnoldo Morale on 04/03/2015, and now undergoing adjuvant FOLFOX chemotherapy for high risk factors.  Treatment will provide an 8% absolute survival advantage and the patient is interested in pursuing systemic chemotherapy.  Patient's malignancy staged today within the problem list.  Oncology history updated today.  Pre-chemo labs today: CBC diff, CMET  Return in 1 week for nadir check.  Return in 2 weeks for follow-up, pre-chemotherapy labs, and cycle 2 of chemotherapy.  Restart Eliquis today/tomorrow depending on time of day typically given.

## 2015-05-16 NOTE — Progress Notes (Signed)
Peter Bogus, MD [address] [place] [address]  Colon carcinoma - Plan: oxaliplatin (ELOXATIN) 205 mg in dextrose 5 % 500 mL chemo infusion, fluorouracil (ADRUCIL) chemo injection 950 mg, fluorouracil (ADRUCIL) 5,850 mg in sodium chloride 0.9 % 133 mL chemo infusion  CURRENT THERAPY: Beginning FOLFOX today, 05/16/2015.  INTERVAL HISTORY: [name] 69 y.o. male returns for followup of Stage IIA adenocarcinoma of colon, S/P definitive surgery by Dr. Arnoldo Morale on 04/03/2015, and now undergoing adjuvant FOLFOX chemotherapy.    Colon carcinoma   03/28/2015 Initial Biopsy Diagnosis 1. Colon, biopsy, left descending - INVASIVE ADENOCARCINOMA. 2. Stomach, biopsy - GASTRIC BODY AND ANTRAL-TYPE MUCOSA WITH ASSOCIATED MINIMAL CHRONIC INFLAMMATION. - NO EVIDENCE OF HELICOBACTER PYLORI, INTESTINAL METAPLASIA, DYSPLASIA OR MA   03/29/2015 Imaging CT abd/pelvis- Circumferential mucosal lesion the proximal sigmoid colon consists with colorectal carcinoma.   04/03/2015 Definitive Surgery Colon, segmental resection for tumor - INVASIVE ADENOCARCINOMA, INVADING THROUGH THE MUSCULARIS PROPRIA INTO PERICOLONIC FATTY TISSUE. - FOURTEEN LYMPH NODES, NEGATIVE FOR METASTATIC CARCINOMA (0/14). - RESECTION MARGINS, NEGATIVE FOR ATYPIA OR MALIGNA   05/16/2015 -  Chemotherapy FOLFOX     I personally reviewed and went over laboratory results with the patient.  The results are noted within this dictation.  He denies any questions.  Oncologically, he denies any complaints and ROS questioning is negative.   Past Medical History  Diagnosis Date  . Varicose veins   . New onset atrial fibrillation 01/02/2015  . Morbid obesity 01/02/2015  . Hypertension   . Dysrhythmia     Afib- on set 12/2014  . Sleep apnea     been tested but has not received the CPAP yet  . GERD (gastroesophageal reflux disease)   . History of gout   . Cancer     colon    has Varicose veins of lower  extremities with other complications; New onset atrial fibrillation; Morbid obesity; Melena; Heme positive stool; Esophageal dysphagia; Diarrhea; Occult blood in stools; Dysphagia, pharyngoesophageal phase; and Colon carcinoma on his problem list.     has No Known Allergies.  Current Outpatient Prescriptions on File Prior to Visit  Medication Sig Dispense Refill  . dextrose 5 % SOLN 1,000 mL with fluorouracil 5 GM/100ML SOLN Inject into the vein every 14 (fourteen) days. To infuse over 46 hours. To be given in near future    . LEUCOVORIN CALCIUM IV Inject into the vein every 14 (fourteen) days. To be started in near future    . lidocaine-prilocaine (EMLA) cream Apply a quarter size amount to port site 1 hour prior to chemo. Do not rub in. Cover with plastic wrap. 30 g 3  . lisinopril (PRINIVIL,ZESTRIL) 10 MG tablet Take 1 tablet (10 mg total) by mouth daily. 30 tablet 12  . omeprazole (PRILOSEC) 20 MG capsule Take 1 capsule (20 mg total) by mouth daily. 30 capsule 3  . OXALIPLATIN IV Inject into the vein every 14 (fourteen) days. To start in near future    . apixaban (ELIQUIS) 5 MG TABS tablet Take 5 mg by mouth daily.    [name] HYDROcodone-acetaminophen (NORCO/VICODIN) 5-325 MG per tablet Take 1-2 tablets by mouth every 6 (six) hours as needed for moderate pain. (Patient not taking: Reported on 05/16/2015) 40 tablet 0  . ibuprofen (ADVIL,MOTRIN) 200 MG tablet Take 400 mg by mouth every 6 (six) hours as needed for mild pain or moderate pain (for knee pain).    . ondansetron (ZOFRAN)  8 MG tablet Take 1 tablet (8 mg total) by mouth every 8 (eight) hours as needed. (Patient not taking: Reported on 05/16/2015) 30 tablet 3  . prochlorperazine (COMPAZINE) 10 MG tablet Take 1 tablet (10 mg total) by mouth every 6 (six) hours as needed for nausea or vomiting. (Patient not taking: Reported on 05/16/2015) 30 tablet 3   Current Facility-Administered Medications on File Prior to Visit  Medication Dose Route Frequency  Provider Last Rate Last Dose  . leucovorin 972 mg in dextrose 5 % 250 mL infusion  400 mg/m2 (Treatment Plan Actual) Intravenous Once Baird Cancer, PA-C 149 mL/hr at 05/16/15 1055 972 mg at 05/16/15 1055  . sodium chloride 0.9 % injection 10 mL  10 mL Intracatheter PRN Baird Cancer, PA-C   10 mL at 05/16/15 1001    Past Surgical History  Procedure Laterality Date  . Vein ligation and stripping      left leg  . Colonoscopy N/A 03/28/2015    Procedure: COLONOSCOPY;  Surgeon: [name], MD;  Location: [facility];  Service: Endoscopy;  Laterality: N/A;  . Esophagogastroduodenoscopy N/A 03/28/2015    Procedure: ESOPHAGOGASTRODUODENOSCOPY (EGD);  Surgeon: [name], MD;  Location: [facility];  Service: Endoscopy;  Laterality: N/A;  . Esophageal dilation N/A 03/28/2015    Procedure: ESOPHAGEAL DILATION;  Surgeon: [name], MD;  Location: [facility];  Service: Endoscopy;  Laterality: N/A;  . Partial colectomy N/A 04/03/2015    Procedure: PARTIAL COLECTOMY;  Surgeon: [name], MD;  Location: [facility];  Service: General;  Laterality: N/A;  . Portacath placement N/A 05/10/2015    Procedure: INSERTION PORT-A-CATH;  Surgeon: [name], MD;  Location: [facility];  Service: General;  Laterality: N/A;  left subclavian    Denies any headaches, dizziness, double vision, fevers, chills, night sweats, nausea, vomiting, diarrhea, constipation, chest pain, heart palpitations, shortness of breath, blood in stool, black tarry stool, urinary pain, urinary burning, urinary frequency, hematuria.   PHYSICAL EXAMINATION  ECOG PERFORMANCE STATUS: 1 - Symptomatic but completely ambulatory  Filed Vitals:   05/16/15 0934  BP: 113/74  Pulse: 88  Temp: 97.8 F (36.6 C)  Resp: 18    GENERAL:alert, no distress, well nourished, well developed, comfortable, cooperative, obese and smiling, accompanied by [name]. SKIN: skin color, texture, turgor are normal, no rashes or significant  lesions HEAD: Normocephalic, No masses, lesions, tenderness or abnormalities EYES: normal, PERRLA, EOMI, Conjunctiva are pink and non-injected EARS: External ears normal OROPHARYNX:lips, buccal mucosa, and tongue normal and mucous membranes are moist  NECK: supple, thyroid normal size, non-tender, without nodularity, trachea midline LYMPH:  not examined BREAST:not examined LUNGS: clear to auscultation  HEART: regular rate & rhythm ABDOMEN:abdomen soft, obese and normal bowel sounds BACK: Back symmetric, no curvature. EXTREMITIES:less then 2 second capillary refill, no joint deformities, effusion, or inflammation, no skin discoloration  NEURO: alert & oriented x 3 with fluent speech, no focal motor/sensory deficits   LABORATORY DATA: CBC    Component Value Date/Time   WBC 9.2 05/05/2015 1107   RBC 4.52 05/05/2015 1107   HGB 14.4 05/05/2015 1107   HCT 41.8 05/05/2015 1107   PLT 148* 05/05/2015 1107   MCV 92.5 05/05/2015 1107   MCH 31.9 05/05/2015 1107   MCHC 34.4 05/05/2015 1107   RDW 13.5 05/05/2015 1107   LYMPHSABS 2.0 04/27/2015 1649   MONOABS 1.0 04/27/2015 1649   EOSABS 0.4 04/27/2015 1649   BASOSABS 0.0 04/27/2015 1649  Chemistry      Component Value Date/Time   NA 135 05/05/2015 1107   K 4.1 05/05/2015 1107   CL 102 05/05/2015 1107   CO2 23 05/05/2015 1107   BUN 11 05/05/2015 1107   CREATININE 0.97 05/05/2015 1107      Component Value Date/Time   CALCIUM 8.7* 05/05/2015 1107   ALKPHOS 83 04/27/2015 1649   AST 23 04/27/2015 1649   ALT 14 04/27/2015 1649   BILITOT 1.3* 04/27/2015 1649      Lab Results  Component Value Date   CEA 3.0 04/27/2015     RADIOGRAPHIC STUDIES:  Dg Chest Port 1 View  05/10/2015   CLINICAL DATA:  Postop for Port-A-Cath placement.  EXAM: PORTABLE CHEST - 1 VIEW  COMPARISON:  03/29/2015.  FINDINGS: Left anterior chest wall Port-A-Cath has its catheter tip in the mid superior vena cava near the confluence with the left  brachiocephalic vein.  There is no pneumothorax.  Cardiac silhouette is normal in size. No mediastinal or hilar masses. Lungs are clear.  IMPRESSION: 1. No acute cardiopulmonary disease. 2. No pneumothorax. Port-A-Cath tip projects in the mid superior vena cava.   Electronically Signed   By: Lajean Manes M.D.   On: 05/10/2015 12:24   Dg C-arm 1-60 Min-no Report  05/10/2015   CLINICAL DATA: port a cath placement   C-ARM 1-60 MINUTES  Fluoroscopy was utilized by the requesting physician.  No radiographic  interpretation.       ASSESSMENT AND PLAN:  Colon carcinoma Stage IIA adenocarcinoma of colon, S/P definitive surgery by Dr. Arnoldo Morale on 04/03/2015, and now undergoing adjuvant FOLFOX chemotherapy for high risk factors.  Treatment will provide an 8% absolute survival advantage and the patient is interested in pursuing systemic chemotherapy.  Patient's malignancy staged today within the problem list.  Oncology history updated today.  Pre-chemo labs today: CBC diff, CMET  Return in 1 week for nadir check.  Return in 2 weeks for follow-up, pre-chemotherapy labs, and cycle 2 of chemotherapy.  Restart Eliquis today/tomorrow depending on time of day typically given.    THERAPY PLAN:  Beginning adjuvant FOLFOX chemotherapy today.  We will manage toxicities if they arise.  All questions were answered. The patient knows to call the clinic with any problems, questions or concerns. We can certainly see the patient much sooner if necessary.  Patient and plan discussed with Dr. Ancil Linsey and she is in agreement with the aforementioned.   This note is electronically signed by: Robynn Pane 05/16/2015 11:02 AM

## 2015-05-16 NOTE — Progress Notes (Signed)
Tolerated chemo well. D/C home with wife with continuous infusion pump intact. Ambulatory on d/c.

## 2015-05-17 ENCOUNTER — Encounter (HOSPITAL_COMMUNITY): Payer: Self-pay | Admitting: *Deleted

## 2015-05-17 NOTE — Progress Notes (Signed)
Spoke with patient regarding chemo. Reports he felt like he had a sore throat last night but it doesn't feel as bad today. Reports this morning he drank some fluid and felt like he had jaw pain but reports he just ate and drank something a short while ago and he didn't have as much jaw pain as earlier. Reviewed oxaliplatin side effects with patient and he verbalized understanding. Return to clinic tomorrow as scheduled to d/c pump.

## 2015-05-18 ENCOUNTER — Encounter (HOSPITAL_BASED_OUTPATIENT_CLINIC_OR_DEPARTMENT_OTHER): Payer: Medicare Other

## 2015-05-18 ENCOUNTER — Encounter (HOSPITAL_COMMUNITY): Payer: Self-pay

## 2015-05-18 VITALS — BP 113/71 | HR 96 | Temp 97.6°F | Resp 18

## 2015-05-18 DIAGNOSIS — Z452 Encounter for adjustment and management of vascular access device: Secondary | ICD-10-CM

## 2015-05-18 DIAGNOSIS — C186 Malignant neoplasm of descending colon: Secondary | ICD-10-CM

## 2015-05-18 DIAGNOSIS — C189 Malignant neoplasm of colon, unspecified: Secondary | ICD-10-CM

## 2015-05-18 MED ORDER — HEPARIN SOD (PORK) LOCK FLUSH 100 UNIT/ML IV SOLN
INTRAVENOUS | Status: AC
  Filled 2015-05-18: qty 5

## 2015-05-18 MED ORDER — HEPARIN SOD (PORK) LOCK FLUSH 100 UNIT/ML IV SOLN
500.0000 [IU] | Freq: Once | INTRAVENOUS | Status: AC | PRN
  Administered 2015-05-18: 500 [IU]

## 2015-05-18 MED ORDER — SODIUM CHLORIDE 0.9 % IJ SOLN
10.0000 mL | INTRAMUSCULAR | Status: DC | PRN
  Administered 2015-05-18: 10 mL
  Filled 2015-05-18: qty 10

## 2015-05-18 NOTE — Progress Notes (Signed)
[name] presented for Portacath  De-access and flush.  Proper placement of portacath confirmed by CXR.  Portacath located left chest wall accessed with  H 20 needle.  Good blood return present. Portacath flushed with 49ml NS and 500U/64ml Heparin and needle removed intact.  Procedure tolerated well and without incident.  Pump removed

## 2015-05-18 NOTE — Patient Instructions (Signed)
[place] at [facility] Discharge Instructions  RECOMMENDATIONS MADE BY THE CONSULTANT AND ANY TEST RESULTS WILL BE SENT TO YOUR REFERRING PHYSICIAN  Pump removed and port flushed Please call the clinic if you have any questions or concerns Follow up as scheduled   Thank you for choosing [place] at [facility] to provide your oncology and hematology care.  To afford each patient quality time with our provider, please arrive at least 15 minutes before your scheduled appointment time.    You need to re-schedule your appointment should you arrive 10 or more minutes late.  We strive to give you quality time with our providers, and arriving late affects you and other patients whose appointments are after yours.  Also, if you no show three or more times for appointments you may be dismissed from the clinic at the providers discretion.     Again, thank you for choosing [facility].  Our hope is that these requests will decrease the amount of time that you wait before being seen by our physicians.       _____________________________________________________________  Should you have questions after your visit to [facility], please contact our office at [phone] between the hours of 8:30 a.m. and 4:30 p.m.  Voicemails left after 4:30 p.m. will not be returned until the following business day.  For prescription refill requests, have your pharmacy contact our office.

## 2015-05-19 ENCOUNTER — Other Ambulatory Visit (HOSPITAL_COMMUNITY): Payer: Self-pay | Admitting: Oncology

## 2015-05-19 ENCOUNTER — Telehealth (HOSPITAL_COMMUNITY): Payer: Self-pay

## 2015-05-19 DIAGNOSIS — J029 Acute pharyngitis, unspecified: Secondary | ICD-10-CM

## 2015-05-19 DIAGNOSIS — R509 Fever, unspecified: Secondary | ICD-10-CM

## 2015-05-19 MED ORDER — AZITHROMYCIN 250 MG PO TABS
ORAL_TABLET | ORAL | Status: DC
Start: 2015-05-19 — End: 2015-05-31

## 2015-05-19 NOTE — Telephone Encounter (Signed)
I will call in a Z-Pak.  If worse over weekend, call PCP or report to ED.  He is on FOLFOX which should not cause significant immunosuppression, particular after 1st treatment.    He can take Tylenol for sore throat.  He may use throat lozenges or anything OTC for sore throat.  KEFALAS,THOMAS 05/19/2015 12:57 PM

## 2015-05-19 NOTE — Telephone Encounter (Signed)
Wife notified regarding picking up the Z-Pak and use of tylenol, throat lozenges, etc.  Knows to go to the ED if symptoms worsen.

## 2015-05-19 NOTE — Telephone Encounter (Signed)
Call from patient with complaints of fever of 100.6.  Denies cough or urinary symptoms but has had a slight sore throat for couple of days.  Denies any other symptoms.  Wants to know what he needs to do.

## 2015-05-23 ENCOUNTER — Encounter (HOSPITAL_COMMUNITY): Payer: Self-pay | Admitting: Hematology & Oncology

## 2015-05-25 ENCOUNTER — Ambulatory Visit (HOSPITAL_COMMUNITY): Payer: Medicare Other | Attending: Hematology & Oncology | Admitting: Physical Therapy

## 2015-05-25 ENCOUNTER — Encounter (HOSPITAL_COMMUNITY): Payer: Self-pay | Admitting: Hematology & Oncology

## 2015-05-25 ENCOUNTER — Encounter (HOSPITAL_BASED_OUTPATIENT_CLINIC_OR_DEPARTMENT_OTHER): Payer: Medicare Other | Admitting: Hematology & Oncology

## 2015-05-25 VITALS — BP 111/67 | HR 89 | Temp 97.8°F | Resp 16 | Wt 278.0 lb

## 2015-05-25 DIAGNOSIS — R262 Difficulty in walking, not elsewhere classified: Secondary | ICD-10-CM | POA: Insufficient documentation

## 2015-05-25 DIAGNOSIS — C189 Malignant neoplasm of colon, unspecified: Secondary | ICD-10-CM | POA: Diagnosis present

## 2015-05-25 DIAGNOSIS — R6889 Other general symptoms and signs: Secondary | ICD-10-CM | POA: Insufficient documentation

## 2015-05-25 DIAGNOSIS — F4321 Adjustment disorder with depressed mood: Secondary | ICD-10-CM

## 2015-05-25 MED ORDER — ESCITALOPRAM OXALATE 10 MG PO TABS: ORAL_TABLET | ORAL | Status: DC

## 2015-05-25 NOTE — Therapy (Signed)
[place], [place], [address] Phone: [phone]   Fax:  [phone]  Physical Therapy Evaluation  Patient Details  Name: [name] MRN: [id number] Date of Birth: 06-Feb-1946 Referring Provider:  [name], MD  Encounter Date: 05/25/2015      PT End of Session - 05/25/15 2048    Visit Number 1   Number of Visits 6   Date for PT Re-Evaluation 06/24/15   Authorization Type Medicare   Authorization - Visit Number 1   Authorization - Number of Visits 6   PT Start Time 1324   PT Stop Time 1600   PT Time Calculation (min) 45 min   Activity Tolerance Patient tolerated treatment well   Behavior During Therapy [facility] for tasks assessed/performed      Past Medical History  Diagnosis Date  . Varicose veins   . New onset atrial fibrillation 01/02/2015  . Morbid obesity 01/02/2015  . Hypertension   . Dysrhythmia     Afib- on set 12/2014  . Sleep apnea     been tested but has not received the CPAP yet  . GERD (gastroesophageal reflux disease)   . History of gout   . Cancer     colon    Past Surgical History  Procedure Laterality Date  . Vein ligation and stripping      left leg  . Colonoscopy N/A 03/28/2015    Procedure: COLONOSCOPY;  Surgeon: [name], MD;  [facility];  Service: Endoscopy;  Laterality: N/A;  . Esophagogastroduodenoscopy N/A 03/28/2015    Procedure: ESOPHAGOGASTRODUODENOSCOPY (EGD);  Surgeon: [name], MD;  [facility];  Service: Endoscopy;  Laterality: N/A;  . Esophageal dilation N/A 03/28/2015    Procedure: ESOPHAGEAL DILATION;  Surgeon: [name], MD;  [facility];  Service: Endoscopy;  Laterality: N/A;  . Partial colectomy N/A 04/03/2015    Procedure: PARTIAL COLECTOMY;  Surgeon: [name], MD;  Location: [facility] OR[facility];  Service: General;  Laterality: N/A;  . Portacath placement N/A 05/10/2015    Procedure: INSERTION PORT-A-CATH;   Surgeon: [name], MD;  Location: [facility];  Service: General;  Laterality: N/A;  left subclavian    There were no vitals filed for this visit.  Visit Diagnosis:  Decreased functional activity tolerance - Plan: PT plan of care cert/re-cert  Difficulty walking - Plan: PT plan of care cert/re-cert      Subjective Assessment - 05/25/15 1541    Pertinent History Patient had cancer surgery in april 2016 to removed a18inches of colon, mD beleives they removed all of colon, Patient began Chemo 05/17/15. patient had surgery 20 years ago to "strip veins" and has sicne had swelling in both legs. patint [place] shooting pool, patient states limited physical activity due to bad knee and limited energy levels noting "I can t walk across the road."  "I feel worn out to where i can sit up for a while then have to sit back down and rest.  little pain with sit to stance.    How long can you walk comfortably? 4 minutes   Patient Stated Goals to have have more energy, to be bale to walk further.    Currently in Pain? Yes   Pain Score 6    Pain Location Knee   Pain Orientation Right   Pain Descriptors / Indicators Aching   Pain Type Chronic pain   Pain Onset More than  a month ago   Pain Frequency Intermittent            [facility] PT Assessment - 05/25/15 0001    Assessment   Medical Diagnosis [facility] - Colon cancer ectomy.    Onset Date/Surgical Date 04/07/15   Next MD Visit [place] 05/31/15   Prior Therapy no   Balance Screen   Has the patient fallen in the past 6 months No   Has the patient had a decrease in activity level because of a fear of falling?  Yes   Is the patient reluctant to leave their home because of a fear of falling?  No   Prior Function   Level of Independence Independent   Vocation Retired   Observation/Other Assessments   Observations FACIT-F: Physical well-being score 20, Social Well Being: 92, Emotional Well Being: 15, Functional Well-being: 14, Fatgue Sub Scale: 14, Facit F  Trial outcome index 48, FACT-G 77, FACIT-F total: 91, positive for fatigue on VAS, Negative for pain on VAS, Positive for distress of VAS   Other Surveys  Other Surveys   Functional Tests   Functional tests Other;Sit to Stand   ROM / Strength   AROM / PROM / Strength Strength   Strength   Right Hip Flexion 5/5   Left Hip Flexion 5/5   Right/Left Knee Right;Left   Right Knee Flexion 4+/5   Right Knee Extension 5/5   Left Knee Flexion 4+/5   Left Knee Extension 5/5   Right/Left Ankle Right;Left   Right Ankle Dorsiflexion 5/5   Left Ankle Dorsiflexion 5/5   Ambulation/Gait   Gait Comments 6 minute walk test: 1102 ft    6 Minute Walk- Baseline   6 Minute Walk- Baseline yes   6 minute walk test results    Aerobic Endurance Distance Walked 1102   Endurance additional comments 0.87m/s gait speed average   Standardized Balance Assessment   Five times sit to stand comments  10.7 seconds   Timed Up and Go Test   Normal TUG (seconds) 10.5                             PT Short Term Goals - 05/25/15 2111    PT SHORT TERM GOAL #1   Title Patient will be able to ambualte at 1.60m/s indicating community ambualtory status   Time 4   Period Weeks   Status New   PT SHORT TERM GOAL #2   Title Patient will be able to ambulate 34minutes without resting.    Time 4   Period Weeks   Status New   PT SHORT TERM GOAL #3   Title Patinet will be independent with monitoring step count   Time 4   Period Weeks   Status New   PT SHORT TERM GOAL #4   Title Patient will average > 4000 steps a day   Time 4   Period Weeks   Status New           PT Long Term Goals - 05/25/15 2113    PT LONG TERM GOAL #1   Title Patient will be ebale to ambualte > 9minutes without resting   Time 8   Period Weeks   Status New   PT LONG TERM GOAL #2   Title Patient will ambualte > 10000 steps per day   Time 10   Period Weeks   Status New   PT LONG TERM GOAL #3  Title Patient will be  independent with HEP and attend silver sneakers at local [place] - 2015/05/27 2049    Clinical Impression Statement Patient displays decreased activity tolerance and limited activity toelrance secodnary to chemotherapy treatments and recent history of colonectomy resulting in decreased quality of life. Patient will benefit from skilled physical therapy to treat and educate patient on exercises training habits and  and appropriate progressions decrease fatigue and increase activity toerance so patient can ambulate for 30 minutes to be able to go grocery shopping as wanted.    Pt will benefit from skilled therapeutic intervention in order to improve on the following deficits Abnormal gait;Difficulty walking;Decreased activity tolerance;Decreased endurance   Rehab Potential Good   PT Frequency 1x / week   PT Duration 8 weeks   PT Treatment/Interventions Gait training;Stair training;Functional mobility training;Therapeutic exercise;Balance training;Patient/family education;Energy conservation   PT Next Visit Plan introduce Nustep, lunges and educate on walkign program   PT Home Exercise Plan to read [facility]AR booklet   Consulted and Agree with Plan of Care Patient          G-Codes - 27-May-2015 03-31-14    Functional Assessment Tool Used FACIT and-F and Fatigue scores limiting walking   Functional Limitation Mobility: Walking and moving around   Mobility: Walking and Moving Around Current Status (W0981) At least 80 percent but less than 100 percent impaired, limited or restricted   Mobility: Walking and Moving Around Goal Status [phone]) At least 60 percent but less than 80 percent impaired, limited or restricted       Problem List Patient Active Problem List   Diagnosis Date Noted  . Colon carcinoma 04/03/2015  . Occult blood in stools   . Dysphagia, pharyngoesophageal phase   . Melena 03/15/2015  . Heme positive stool 03/15/2015  . Esophageal dysphagia 03/15/2015  .  Diarrhea 03/15/2015  . New onset atrial fibrillation 01/02/2015  . Morbid obesity 01/02/2015  . Varicose veins of lower extremities with other complications 82/95/6213   Devona Konig PT DPT [place], [place], [address] Phone: [phone]   Fax:  [phone]

## 2015-05-25 NOTE — Progress Notes (Signed)
[name], MD [place] [address]  Colon carcinoma - Plan: Ambulatory referral to Physical Therapy  CURRENT THERAPY: Adjuvant FOLFOX   INTERVAL HISTORY: [name] 69 y.o. male returns for followup of Stage IIA adenocarcinoma of colon, S/P definitive surgery by Dr. Arnoldo Morale on 04/03/2015, and now undergoing adjuvant FOLFOX chemotherapy.   He says he feels "rough" and that he feels "bad all the time". He doesn't feel like walking around or doing anything. Sometimes he'll feel okay in the morning but by the end of the day he feels bad again.  Wife believes it may be related to depression. She believes that he dwells on the fact that his own mother had colon cancer and went through a lot of painful times with her treatment. She ultimately died from her disease.  He has had no diarrhea, nausea, or vomiting. He has never had problems with depression before. He doesn't believe that it's hopeless, however his "perspective has changed" because he has never really been sick before and he isn't able to enjoy his retirement the way he wanted to. He states he now has heart issues that "he didn't count on."  He doesn't socialize because he doesn't want his friends to know he's sick. He doesn't feel as hungry, his wife says this isn't anything new since he had his colon surgery so it is not chemotherapy related.     Colon carcinoma   03/28/2015 Initial Biopsy Diagnosis 1. Colon, biopsy, left descending - INVASIVE ADENOCARCINOMA. 2. Stomach, biopsy - GASTRIC BODY AND ANTRAL-TYPE MUCOSA WITH ASSOCIATED MINIMAL CHRONIC INFLAMMATION. - NO EVIDENCE OF HELICOBACTER PYLORI, INTESTINAL METAPLASIA, DYSPLASIA OR MA   03/29/2015 Imaging CT abd/pelvis- Circumferential mucosal lesion the proximal sigmoid colon consists with colorectal carcinoma.   04/03/2015 Definitive Surgery Colon, segmental resection for tumor - INVASIVE ADENOCARCINOMA, INVADING THROUGH THE  MUSCULARIS PROPRIA INTO PERICOLONIC FATTY TISSUE. - FOURTEEN LYMPH NODES, NEGATIVE FOR METASTATIC CARCINOMA (0/14). - RESECTION MARGINS, NEGATIVE FOR ATYPIA OR MALIGNA   05/16/2015 -  Chemotherapy FOLFOX      Past Medical History  Diagnosis Date  . Varicose veins   . New onset atrial fibrillation 01/02/2015  . Morbid obesity 01/02/2015  . Hypertension   . Dysrhythmia     Afib- on set 12/2014  . Sleep apnea     been tested but has not received the CPAP yet  . GERD (gastroesophageal reflux disease)   . History of gout   . Cancer     colon    has Varicose veins of lower extremities with other complications; New onset atrial fibrillation; Morbid obesity; Melena; Heme positive stool; Esophageal dysphagia; Diarrhea; Occult blood in stools; Dysphagia, pharyngoesophageal phase; and Colon carcinoma on his problem list.   He believes that his sister died of heart issues, she died in her sleep.   has No Known Allergies.  Current Outpatient Prescriptions on File Prior to Visit  Medication Sig Dispense Refill  . apixaban (ELIQUIS) 5 MG TABS tablet Take 5 mg by mouth daily.    [name] dextrose 5 % SOLN 1,000 mL with fluorouracil 5 GM/100ML SOLN Inject into the vein every 14 (fourteen) days. To infuse over 46 hours. To be given in near future    . LEUCOVORIN CALCIUM IV Inject into the vein every 14 (fourteen) days. To be started in near future    . lidocaine-prilocaine (EMLA) cream Apply a quarter size amount to port site 1 hour prior  to chemo. Do not rub in. Cover with plastic wrap. 30 g 3  . lisinopril (PRINIVIL,ZESTRIL) 10 MG tablet Take 1 tablet (10 mg total) by mouth daily. 30 tablet 12  . omeprazole (PRILOSEC) 20 MG capsule Take 1 capsule (20 mg total) by mouth daily. 30 capsule 3  . OXALIPLATIN IV Inject into the vein every 14 (fourteen) days. To start in near future    . azithromycin (ZITHROMAX) 250 MG tablet As directed 6 each 0  . HYDROcodone-acetaminophen (NORCO/VICODIN) 5-325 MG per tablet Take  1-2 tablets by mouth every 6 (six) hours as needed for moderate pain. 40 tablet 0  . ibuprofen (ADVIL,MOTRIN) 200 MG tablet Take 400 mg by mouth every 6 (six) hours as needed for mild pain or moderate pain (for knee pain).    . ondansetron (ZOFRAN) 8 MG tablet Take 1 tablet (8 mg total) by mouth every 8 (eight) hours as needed. 30 tablet 3  . prochlorperazine (COMPAZINE) 10 MG tablet Take 1 tablet (10 mg total) by mouth every 6 (six) hours as needed for nausea or vomiting. 30 tablet 3   No current facility-administered medications on file prior to visit.    Past Surgical History  Procedure Laterality Date  . Vein ligation and stripping      left leg  . Colonoscopy N/A 03/28/2015    Procedure: COLONOSCOPY;  Surgeon: [name], MD;  [facility];  Service: Endoscopy;  Laterality: N/A;  . Esophagogastroduodenoscopy N/A 03/28/2015    Procedure: ESOPHAGOGASTRODUODENOSCOPY (EGD);  Surgeon: [name], MD;  [facility];  Service: Endoscopy;  Laterality: N/A;  . Esophageal dilation N/A 03/28/2015    Procedure: ESOPHAGEAL DILATION;  Surgeon: [name], MD;  [facility];  Service: Endoscopy;  Laterality: N/A;  . Partial colectomy N/A 04/03/2015    Procedure: PARTIAL COLECTOMY;  Surgeon: [name], MD;  Location: [facility];  Service: General;  Laterality: N/A;  . Portacath placement N/A 05/10/2015    Procedure: INSERTION PORT-A-CATH;  Surgeon: [name], MD;  Location: [facility];  Service: General;  Laterality: N/A;  left subclavian    Denies any headaches, dizziness, double vision, fevers, chills, night sweats, nausea, vomiting, diarrhea, constipation, chest pain, heart palpitations, shortness of breath, blood in stool, black tarry stool, urinary pain, urinary burning, urinary frequency, hematuria. Positive for malaise/fatigue. Positive for depression. Positive for appetite loss.   PHYSICAL EXAMINATION  ECOG PERFORMANCE STATUS: 1 - Symptomatic but  completely ambulatory  Filed Vitals:   05/25/15 0830  BP: 111/67  Pulse: 89  Temp: 97.8 F (36.6 C)  Resp: 16    GENERAL:alert, no distress, well nourished, well developed, comfortable, cooperative, obese and tearful SKIN: skin color, texture, turgor are normal, no rashes or significant lesions HEAD: Normocephalic, No masses, lesions, tenderness or abnormalities EYES: normal, PERRLA, EOMI, Conjunctiva are pink and non-injected EARS: External ears normal OROPHARYNX:lips, buccal mucosa, and tongue normal and mucous membranes are moist  NECK: supple, thyroid normal size, non-tender, without nodularity, trachea midline LYMPH:  not examined BREAST:not examined LUNGS: clear to auscultation  HEART: regular rate & rhythm ABDOMEN:abdomen soft, obese and normal bowel sounds BACK: Back symmetric, no curvature. EXTREMITIES:less then 2 second capillary refill, no joint deformities, effusion, or inflammation, no skin discoloration  NEURO: alert & oriented x 3 with fluent speech, no focal motor/sensory deficits   LABORATORY DATA:  CBC    Component Value Date/Time   WBC 7.4 05/16/2015 1105   RBC 4.00* 05/16/2015  1105   HGB 12.7* 05/16/2015 1105   HCT 37.3* 05/16/2015 1105   PLT 165 05/16/2015 1105   MCV 93.3 05/16/2015 1105   MCH 31.8 05/16/2015 1105   MCHC 34.0 05/16/2015 1105   RDW 13.6 05/16/2015 1105   LYMPHSABS 1.5 05/16/2015 1105   MONOABS 0.7 05/16/2015 1105   EOSABS 0.3 05/16/2015 1105   BASOSABS 0.0 05/16/2015 1105      Chemistry      Component Value Date/Time   NA 137 05/16/2015 1105   K 3.5 05/16/2015 1105   CL 102 05/16/2015 1105   CO2 27 05/16/2015 1105   BUN 9 05/16/2015 1105   CREATININE 0.92 05/16/2015 1105      Component Value Date/Time   CALCIUM 8.4* 05/16/2015 1105   ALKPHOS 78 05/16/2015 1105   AST 27 05/16/2015 1105   ALT 11* 05/16/2015 1105   BILITOT 1.0 05/16/2015 1105      Lab Results  Component Value Date   CEA 3.0 04/27/2015      RADIOGRAPHIC STUDIES:  Dg Chest Port 1 View  05/10/2015   CLINICAL DATA:  Postop for Port-A-Cath placement.  EXAM: PORTABLE CHEST - 1 VIEW  COMPARISON:  03/29/2015.  FINDINGS: Left anterior chest wall Port-A-Cath has its catheter tip in the mid superior vena cava near the confluence with the left brachiocephalic vein.  There is no pneumothorax.  Cardiac silhouette is normal in size. No mediastinal or hilar masses. Lungs are clear.  IMPRESSION: 1. No acute cardiopulmonary disease. 2. No pneumothorax. Port-A-Cath tip projects in the mid superior vena cava.   Electronically Signed   By: Lajean Manes M.D.   On: 05/10/2015 12:24   Dg C-arm 1-60 Min-no Report  05/10/2015   CLINICAL DATA: port a cath placement   C-ARM 1-60 MINUTES  Fluoroscopy was utilized by the requesting physician.  No radiographic  interpretation.       ASSESSMENT AND PLAN:  Stage II colorectal cancer Adjuvant FOLFOX Family history of colon cancer Situational depression/anxiety  I discussed several scenarios with the patient. One being to discontinue his treatment, I again reminded him that he had a very good risk of cure with surgery alone. Again reviewed the small benefit of adjuvant chemotherapy. He again reflects on his mother and her death from colon cancer.  He and his wife do not feel that his extreme fatigue and lack of energy related to therapy. They feel it is related to depression. He was very tearful throughout our conversation today.  We ultimately decided to start an anti-to present and Lexapro will be called in. We will see him back next week prior to chemotherapy and see how he is doing. I reviewed with the patient that an anti-depressant can take several weeks to achieve maximal benefit. I offered to delay his therapy next week but he wishes to proceed. I recommended dose adjusting his oxaliplatin secondary to his fatigue.  We discussed genetics counseling today and he is willing to consider that as  well.  From a referral to the [facility] program Interested in a nutrition consult and we can again discuss this at follow-up.  All questions were answered. The patient knows to call the clinic with any problems, questions or concerns. We can certainly see the patient much sooner if necessary.  This document serves as a record of services personally performed by Ancil Linsey, MD. It was created on her behalf by Arlyce Harman, a trained medical scribe. The creation of this record is based on the  scribe's personal observations and the provider's statements to them. This document has been checked and approved by the attending provider.  I have reviewed the above documentation for accuracy and completeness, and I agree with the above.  Molli Hazard, MD

## 2015-05-25 NOTE — Patient Instructions (Signed)
[place] at [facility] Discharge Instructions  RECOMMENDATIONS MADE BY THE CONSULTANT AND ANY TEST RESULTS WILL BE SENT TO YOUR REFERRING PHYSICIAN.  Exam and discussion by Dr. Esaw Dace - take as directed [facility] Referral Report uncontrolled nausea, vomiting or other concerns.  Follow-up as scheduled for chemo and office visit in 1 week.  Thank you for choosing [place] at [facility] to provide your oncology and hematology care.  To afford each patient quality time with our provider, please arrive at least 15 minutes before your scheduled appointment time.    You need to re-schedule your appointment should you arrive 10 or more minutes late.  We strive to give you quality time with our providers, and arriving late affects you and other patients whose appointments are after yours.  Also, if you no show three or more times for appointments you may be dismissed from the clinic at the providers discretion.     Again, thank you for choosing [facility].  Our hope is that these requests will decrease the amount of time that you wait before being seen by our physicians.       _____________________________________________________________  Should you have questions after your visit to [facility], please contact our office at [phone] between the hours of 8:30 a.m. and 4:30 p.m.  Voicemails left after 4:30 p.m. will not be returned until the following business day.  For prescription refill requests, have your pharmacy contact our office.

## 2015-05-26 ENCOUNTER — Encounter: Payer: Self-pay | Admitting: Dietician

## 2015-05-26 NOTE — Progress Notes (Signed)
Patient again identified to be at risk for malnutrition on the MST secondary to Wt loss and poor PO intake. Spoke with family ~1 month ago before the start of chemo.   Contacted Pt by Phone  Wt Readings from Last 10 Encounters:  05/25/15 278 lb (126.1 kg)  05/16/15 285 lb (129.275 kg)  04/27/15 281 lb 3.2 oz (127.551 kg)  04/03/15 308 lb (139.708 kg)  03/15/15 308 lb 6.4 oz (139.889 kg)  03/13/15 304 lb (137.893 kg)  01/30/15 304 lb (137.893 kg)  01/17/15 304 lb (137.893 kg)  01/02/15 308 lb 11.2 oz (140.025 kg)  05/17/14 315 lb (142.883 kg)  Pt was lost a substantial amount of weight, roughly 30 lbs, in the past 2 months.  Called the pt 2x, but was unable to get in touch. Left a message/  Per notes, pt appears to mainly suffering from stress and depression and these are likely what is affecting his appetite and consequentially, his weight. He denied n,v,d.  Will continue to monitor.   Burtis Junes RD, LDN Nutrition Pager: [phone] 05/26/2015 2:06 PM

## 2015-05-30 ENCOUNTER — Encounter (HOSPITAL_COMMUNITY): Payer: Self-pay | Admitting: Genetic Counselor

## 2015-05-30 ENCOUNTER — Encounter (HOSPITAL_BASED_OUTPATIENT_CLINIC_OR_DEPARTMENT_OTHER): Payer: Medicare Other | Admitting: Genetic Counselor

## 2015-05-30 DIAGNOSIS — Z800 Family history of malignant neoplasm of digestive organs: Secondary | ICD-10-CM

## 2015-05-30 DIAGNOSIS — C187 Malignant neoplasm of sigmoid colon: Secondary | ICD-10-CM

## 2015-05-30 DIAGNOSIS — Z803 Family history of malignant neoplasm of breast: Secondary | ICD-10-CM | POA: Diagnosis not present

## 2015-05-30 DIAGNOSIS — Z315 Encounter for genetic counseling: Secondary | ICD-10-CM | POA: Diagnosis not present

## 2015-05-30 DIAGNOSIS — C186 Malignant neoplasm of descending colon: Secondary | ICD-10-CM | POA: Diagnosis present

## 2015-05-30 NOTE — Progress Notes (Signed)
REFERRING PROVIDER: [name], MD [address], [place] [address]   Ancil Linsey, MD  PRIMARY PROVIDER:  [name], MD  PRIMARY REASON FOR VISIT:  1. Malignant neoplasm of sigmoid colon   2. Family history of colon cancer   3. Family history of breast cancer   4. Family history of breast cancer in mother      HISTORY OF PRESENT ILLNESS:   [name], a 69 y.o. male, was seen for a [place] cancer genetics consultation at the request of Dr. Luan Pulling due to a personal and family history of cancer.  [name] presents to clinic today to discuss the possibility of a hereditary predisposition to cancer, genetic testing, and to further clarify his future cancer risks, as well as potential cancer risks for family members.   In 2016, at the age of 68, [name] was diagnosed with colon cancer of the left colon. This was treated with surgery and chemotherapy.  MSI and IHC testing was performed and was normal.  [name] is a non-smoker and non-drinker.   CANCER HISTORY:    Colon carcinoma   03/28/2015 Initial Biopsy Diagnosis 1. Colon, biopsy, left descending - INVASIVE ADENOCARCINOMA. 2. Stomach, biopsy - GASTRIC BODY AND ANTRAL-TYPE MUCOSA WITH ASSOCIATED MINIMAL CHRONIC INFLAMMATION. - NO EVIDENCE OF HELICOBACTER PYLORI, INTESTINAL METAPLASIA, DYSPLASIA OR MA   03/29/2015 Imaging CT abd/pelvis- Circumferential mucosal lesion the proximal sigmoid colon consists with colorectal carcinoma.   04/03/2015 Definitive Surgery Colon, segmental resection for tumor - INVASIVE ADENOCARCINOMA, INVADING THROUGH THE MUSCULARIS PROPRIA INTO PERICOLONIC FATTY TISSUE. - FOURTEEN LYMPH NODES, NEGATIVE FOR METASTATIC CARCINOMA (0/14). - RESECTION MARGINS, NEGATIVE FOR ATYPIA OR MALIGNA   05/16/2015 -  Chemotherapy FOLFOX     RISK FACTORS:  Colonoscopy: yes; abnormal. Polyps: No MSI - Stable IHC - Present  Past Medical History  Diagnosis Date  . Varicose  veins   . New onset atrial fibrillation 01/02/2015  . Morbid obesity 01/02/2015  . Hypertension   . Dysrhythmia     Afib- on set 12/2014  . Sleep apnea     been tested but has not received the CPAP yet  . GERD (gastroesophageal reflux disease)   . History of gout   . Cancer     colon  . Family history of colon cancer   . Family history of breast cancer in mother     Past Surgical History  Procedure Laterality Date  . Vein ligation and stripping      left leg  . Colonoscopy N/A 03/28/2015    Procedure: COLONOSCOPY;  Surgeon: [name], MD;  [facility];  Service: Endoscopy;  Laterality: N/A;  . Esophagogastroduodenoscopy N/A 03/28/2015    Procedure: ESOPHAGOGASTRODUODENOSCOPY (EGD);  Surgeon: [name], MD;  [facility];  Service: Endoscopy;  Laterality: N/A;  . Esophageal dilation N/A 03/28/2015    Procedure: ESOPHAGEAL DILATION;  Surgeon: [name], MD;  [facility];  Service: Endoscopy;  Laterality: N/A;  . Partial colectomy N/A 04/03/2015    Procedure: PARTIAL COLECTOMY;  Surgeon: [name], MD;  Location: [facility];  Service: General;  Laterality: N/A;  . Portacath placement N/A 05/10/2015    Procedure: INSERTION PORT-A-CATH;  Surgeon: [name], MD;  Location: [facility];  Service: General;  Laterality: N/A;  left subclavian    History   Social History  . Marital Status: Married    Spouse Name: [name]  . Number of  Children: 2  . Years of Education: N/A   Social History Main Topics  . Smoking status: Never Smoker   . Smokeless tobacco: Never Used  . Alcohol Use: No  . Drug Use: No  . Sexual Activity: No   Other Topics Concern  . None   Social History Narrative     FAMILY HISTORY:  We obtained a detailed, 4-generation family history.  Significant diagnoses are listed below: Family History  Problem Relation Age of Onset  . Breast cancer Mother     dx under 11  . Colon cancer Mother     dx in her mid 56s  . Heart  disease Father   . Hyperlipidemia Father   . Liver cancer Father     heavy ETOH user when young  . Cancer Sister     slow "blood" cancer  . Diabetes Sister   . Diabetes Brother   . Cancer Maternal Aunt     2-3 maternal aunts with Cancer NOS  . Lung cancer Paternal Uncle     three uncles with lung cancer - all smokers  . Cancer Paternal Grandmother     possible bone cancer   The patient was diagnosed with colon cancer at age 24.  His nephew was diagnosed and died of liver cancer at age 69.  His father was diagnosed and died of liver cancer at 54 and his mother was diagnosed with breast cancer under the age of 60 and colon cancer in her mid 84s.  His mother had three to four sisters and one brother.  Two to three of her sisters had an unknown form of cancer.  His father had three brothers who all had lung cancer.  His paternal grandmother had bone cancer. Patient's maternal ancestors are of Caucausain descent, and paternal ancestors are of [place] descent. There is no reported Ashkenazi Jewish ancestry. There is no known consanguinity.  GENETIC COUNSELING ASSESSMENT: [name] is a 69 y.o. male with a personal and family history of cancer which somewhat suggestive of a familial vs sporadic cancer. We, therefore, discussed and recommended the following at today's visit.   DISCUSSION: We reviewed the characteristics, features and inheritance patterns of hereditary cancer syndromes. Based on the average age of diagnosis of colon cancer and the normal MSI/IHC results, we discussed with [name] that the family history is not highly consistent with a familial hereditary cancer syndrome, and we feel he is at low risk to harbor a gene mutation associated with such a condition. His mother did have breast cancer at a young age of onset, and therefore we could offer genetic testing based on this early age of onset.  Genetic testing could be performed on genes associated with breast and colon  cancer genes.  [name] did not feel worried that about his risk for a hereditary cancer syndrome, thus, we did not recommend any genetic testing, at this time, and recommended [name] continue to follow the cancer screening guidelines given by his primary healthcare provider.  [name] wife disclosed her family history of pancreatic, prostate, breast and ovarian cancer.  We reviewed that this combination of cancer is a red flag and that she should discuss with her physician if genetic testing may be right for her.  She will talk with her family to determine whether anyone else has had genetic testing.  PLAN: No genetic testing was performed today.  We encouraged [name] to remain in contact with cancer genetics annually so that  we can continuously update the family history and inform him of any changes in cancer genetics and testing that may be of benefit for this family.   Mr.  Beasley questions were answered to his satisfaction today. Our contact information was provided should additional questions or concerns arise. Thank you for the referral and allowing [place] to share in the care of your patient.   Karen P. Florene Glen, [place], [facility] Certified Genetic Counselor [email or web address] phone: [phone]  The patient was seen for a total of 35 minutes in face-to-face genetic counseling.  This patient was discussed with Drs. Magrinat, Lindi Adie and/or Burr Medico who agrees with the above.    _______________________________________________________________________ For Office Staff:  Number of people involved in session: 2 Was an Intern/ student involved with case: no

## 2015-05-31 ENCOUNTER — Encounter (HOSPITAL_COMMUNITY): Payer: Self-pay | Admitting: Oncology

## 2015-05-31 ENCOUNTER — Encounter (HOSPITAL_BASED_OUTPATIENT_CLINIC_OR_DEPARTMENT_OTHER): Payer: Medicare Other | Admitting: Oncology

## 2015-05-31 ENCOUNTER — Encounter (HOSPITAL_COMMUNITY): Payer: Medicare Other | Attending: Hematology & Oncology

## 2015-05-31 VITALS — BP 140/77 | HR 80 | Temp 97.9°F | Resp 18 | Wt 280.3 lb

## 2015-05-31 VITALS — BP 133/71 | HR 75

## 2015-05-31 DIAGNOSIS — C186 Malignant neoplasm of descending colon: Secondary | ICD-10-CM

## 2015-05-31 DIAGNOSIS — R195 Other fecal abnormalities: Secondary | ICD-10-CM | POA: Diagnosis not present

## 2015-05-31 DIAGNOSIS — C189 Malignant neoplasm of colon, unspecified: Secondary | ICD-10-CM

## 2015-05-31 DIAGNOSIS — Z5111 Encounter for antineoplastic chemotherapy: Secondary | ICD-10-CM

## 2015-05-31 DIAGNOSIS — C187 Malignant neoplasm of sigmoid colon: Secondary | ICD-10-CM | POA: Insufficient documentation

## 2015-05-31 LAB — COMPREHENSIVE METABOLIC PANEL
ALT: 16 U/L — AB (ref 17–63)
ANION GAP: 9 (ref 5–15)
AST: 25 U/L (ref 15–41)
Albumin: 3 g/dL — ABNORMAL LOW (ref 3.5–5.0)
Alkaline Phosphatase: 93 U/L (ref 38–126)
BILIRUBIN TOTAL: 1.7 mg/dL — AB (ref 0.3–1.2)
BUN: 10 mg/dL (ref 6–20)
CO2: 24 mmol/L (ref 22–32)
Calcium: 8.8 mg/dL — ABNORMAL LOW (ref 8.9–10.3)
Chloride: 102 mmol/L (ref 101–111)
Creatinine, Ser: 0.92 mg/dL (ref 0.61–1.24)
GFR calc non Af Amer: >60 mL/min (ref >60)
Glucose, Bld: 102 mg/dL — ABNORMAL HIGH (ref 65–99)
Potassium: 3.4 mmol/L — ABNORMAL LOW (ref 3.5–5.1)
Sodium: 135 mmol/L (ref 135–145)
TOTAL PROTEIN: 6.8 g/dL (ref 6.5–8.1)

## 2015-05-31 LAB — CBC WITH DIFFERENTIAL/PLATELET
BASOS ABS: 0 10*3/uL (ref 0.0–0.1)
Basophils Relative: 1 % (ref 0–1)
Eosinophils Absolute: 0.1 10*3/uL (ref 0.0–0.7)
Eosinophils Relative: 4 % (ref 0–5)
HEMATOCRIT: 38.8 % — AB (ref 39.0–52.0)
Hemoglobin: 13.4 g/dL (ref 13.0–17.0)
LYMPHS ABS: 1.4 10*3/uL (ref 0.7–4.0)
Lymphocytes Relative: 38 % (ref 12–46)
MCH: 31.7 pg (ref 26.0–34.0)
MCHC: 34.5 g/dL (ref 30.0–36.0)
MCV: 91.7 fL (ref 78.0–100.0)
MONO ABS: 0.6 10*3/uL (ref 0.1–1.0)
Monocytes Relative: 17 % — ABNORMAL HIGH (ref 3–12)
Neutro Abs: 1.5 10*3/uL — ABNORMAL LOW (ref 1.7–7.7)
Neutrophils Relative %: 40 % — ABNORMAL LOW (ref 43–77)
PLATELETS: 142 10*3/uL — AB (ref 150–400)
RBC: 4.23 MIL/uL (ref 4.22–5.81)
RDW: 14.6 % (ref 11.5–15.5)
WBC: 3.6 10*3/uL — AB (ref 4.0–10.5)

## 2015-05-31 MED ORDER — FLUOROURACIL CHEMO INJECTION 2.5 GM/50ML
400.0000 mg/m2 | Freq: Once | INTRAVENOUS | Status: AC
  Administered 2015-05-31: 950 mg via INTRAVENOUS
  Filled 2015-05-31: qty 19

## 2015-05-31 MED ORDER — SODIUM CHLORIDE 0.9 % IV SOLN
2400.0000 mg/m2 | INTRAVENOUS | Status: DC
  Administered 2015-05-31: 5850 mg via INTRAVENOUS
  Filled 2015-05-31: qty 117

## 2015-05-31 MED ORDER — SODIUM CHLORIDE 0.9 % IV SOLN
Freq: Once | INTRAVENOUS | Status: AC
  Administered 2015-05-31: 11:00:00 via INTRAVENOUS
  Filled 2015-05-31: qty 4

## 2015-05-31 MED ORDER — SODIUM CHLORIDE 0.9 % IJ SOLN: 10.0000 mL | INTRAMUSCULAR | Status: DC | PRN

## 2015-05-31 MED ORDER — DEXTROSE 5 % IV SOLN
68.0000 mg/m2 | Freq: Once | INTRAVENOUS | Status: AC
  Administered 2015-05-31: 165 mg via INTRAVENOUS
  Filled 2015-05-31: qty 33

## 2015-05-31 MED ORDER — DEXTROSE 5 % IV SOLN
Freq: Once | INTRAVENOUS | Status: AC
  Administered 2015-05-31: 11:00:00 via INTRAVENOUS

## 2015-05-31 MED ORDER — LEUCOVORIN CALCIUM INJECTION 350 MG
400.0000 mg/m2 | Freq: Once | INTRAVENOUS | Status: AC
  Administered 2015-05-31: 972 mg via INTRAVENOUS
  Filled 2015-05-31: qty 48.6

## 2015-05-31 NOTE — Progress Notes (Signed)
Peter Bogus, MD [address] [place] [address]  Colon carcinoma  CURRENT THERAPY: Beginning FOLFOX today, 05/16/2015.  INTERVAL HISTORY: [name] 69 y.o. male returns for followup of Stage IIA adenocarcinoma of colon, S/P definitive surgery by Dr. Arnoldo Morale on 04/03/2015, and now undergoing adjuvant FOLFOX chemotherapy.    Colon carcinoma   03/28/2015 Initial Biopsy Diagnosis 1. Colon, biopsy, left descending - INVASIVE ADENOCARCINOMA. 2. Stomach, biopsy - GASTRIC BODY AND ANTRAL-TYPE MUCOSA WITH ASSOCIATED MINIMAL CHRONIC INFLAMMATION. - NO EVIDENCE OF HELICOBACTER PYLORI, INTESTINAL METAPLASIA, DYSPLASIA OR MA   03/29/2015 Imaging CT abd/pelvis- Circumferential mucosal lesion the proximal sigmoid colon consists with colorectal carcinoma.   04/03/2015 Definitive Surgery Colon, segmental resection for tumor - INVASIVE ADENOCARCINOMA, INVADING THROUGH THE MUSCULARIS PROPRIA INTO PERICOLONIC FATTY TISSUE. - FOURTEEN LYMPH NODES, NEGATIVE FOR METASTATIC CARCINOMA (0/14). - RESECTION MARGINS, NEGATIVE FOR ATYPIA OR MALIGNA   05/16/2015 -  Chemotherapy FOLFOX    05/31/2015 Treatment Plan Change Oxaliplatin dose reduced by 20% secondary to fatigue from cycle 1 of therapy.   I personally reviewed and went over laboratory results with the patient.  The results are noted within this dictation.  Genetic counseling dictation noted and appreciated.   He started Lexapro last week and he reports that his spirits and emotional status is improved.  He notes that his appetite is improved he is sleeping better.  He is tolerating the new medication well without any identifiable side effects.   He is educated about his Oxaliplatin dose reduction.   Oncologically, he denies any complaints and ROS questioning is negative.   Past Medical History  Diagnosis Date  . Varicose veins   . New onset atrial fibrillation 01/02/2015  . Morbid obesity 01/02/2015  . Hypertension   .  Dysrhythmia     Afib- on set 12/2014  . Sleep apnea     been tested but has not received the CPAP yet  . GERD (gastroesophageal reflux disease)   . History of gout   . Cancer     colon  . Family history of colon cancer   . Family history of breast cancer in mother     has Varicose veins of lower extremities with other complications; New onset atrial fibrillation; Morbid obesity; Melena; Heme positive stool; Esophageal dysphagia; Diarrhea; Occult blood in stools; Dysphagia, pharyngoesophageal phase; Colon carcinoma; Family history of colon cancer; and Family history of breast cancer in mother on his problem list.     has No Known Allergies.  Current Outpatient Prescriptions on File Prior to Visit  Medication Sig Dispense Refill  . apixaban (ELIQUIS) 5 MG TABS tablet Take 5 mg by mouth daily.    [name] dextrose 5 % SOLN 1,000 mL with fluorouracil 5 GM/100ML SOLN Inject into the vein every 14 (fourteen) days. To infuse over 46 hours. To be given in near future    . escitalopram (LEXAPRO) 10 MG tablet Take one tablet daily for 5 days then increase to two tablets therafter 60 tablet 3  . LEUCOVORIN CALCIUM IV Inject into the vein every 14 (fourteen) days. To be started in near future    . lidocaine-prilocaine (EMLA) cream Apply a quarter size amount to port site 1 hour prior to chemo. Do not rub in. Cover with plastic wrap. 30 g 3  . lisinopril (PRINIVIL,ZESTRIL) 10 MG tablet Take 1 tablet (10 mg total) by mouth daily. 30 tablet 12  . omeprazole (PRILOSEC) 20 MG capsule Take  1 capsule (20 mg total) by mouth daily. 30 capsule 3  . OXALIPLATIN IV Inject into the vein every 14 (fourteen) days. To start in near future    . HYDROcodone-acetaminophen (NORCO/VICODIN) 5-325 MG per tablet Take 1-2 tablets by mouth every 6 (six) hours as needed for moderate pain. (Patient not taking: Reported on 05/31/2015) 40 tablet 0  . ibuprofen (ADVIL,MOTRIN) 200 MG tablet Take 400 mg by mouth every 6 (six) hours as needed  for mild pain or moderate pain (for knee pain).    . ondansetron (ZOFRAN) 8 MG tablet Take 1 tablet (8 mg total) by mouth every 8 (eight) hours as needed. (Patient not taking: Reported on 05/31/2015) 30 tablet 3  . prochlorperazine (COMPAZINE) 10 MG tablet Take 1 tablet (10 mg total) by mouth every 6 (six) hours as needed for nausea or vomiting. (Patient not taking: Reported on 05/31/2015) 30 tablet 3   No current facility-administered medications on file prior to visit.    Past Surgical History  Procedure Laterality Date  . Vein ligation and stripping      left leg  . Colonoscopy N/A 03/28/2015    Procedure: COLONOSCOPY;  Surgeon: [name], MD;  [facility];  Service: Endoscopy;  Laterality: N/A;  . Esophagogastroduodenoscopy N/A 03/28/2015    Procedure: ESOPHAGOGASTRODUODENOSCOPY (EGD);  Surgeon: [name], MD;  [facility];  Service: Endoscopy;  Laterality: N/A;  . Esophageal dilation N/A 03/28/2015    Procedure: ESOPHAGEAL DILATION;  Surgeon: [name], MD;  [facility];  Service: Endoscopy;  Laterality: N/A;  . Partial colectomy N/A 04/03/2015    Procedure: PARTIAL COLECTOMY;  Surgeon: [name], MD;  Location: [facility];  Service: General;  Laterality: N/A;  . Portacath placement N/A 05/10/2015    Procedure: INSERTION PORT-A-CATH;  Surgeon: [name], MD;  Location: [facility];  Service: General;  Laterality: N/A;  left subclavian    Denies any headaches, dizziness, double vision, fevers, chills, night sweats, nausea, vomiting, diarrhea, constipation, chest pain, heart palpitations, shortness of breath, blood in stool, black tarry stool, urinary pain, urinary burning, urinary frequency, hematuria.   PHYSICAL EXAMINATION  ECOG PERFORMANCE STATUS: 1 - Symptomatic but completely ambulatory  Filed Vitals:   05/31/15 0900  BP: 140/77  Pulse: 80  Temp: 97.9 F (36.6 C)  Resp: 18    GENERAL:alert, no distress, well nourished, well  developed, comfortable, cooperative, obese and smiling, accompanied by [name]. SKIN: skin color, texture, turgor are normal, no rashes or significant lesions HEAD: Normocephalic, No masses, lesions, tenderness or abnormalities EYES: normal, PERRLA, EOMI, Conjunctiva are pink and non-injected EARS: External ears normal OROPHARYNX:lips, buccal mucosa, and tongue normal and mucous membranes are moist  NECK: supple, thyroid normal size, non-tender, without nodularity, trachea midline, thick LYMPH:  No adenopathy noted BREAST:Gynecomastia is noted LUNGS: clear to auscultation  HEART: regular rate & rhythm ABDOMEN:abdomen soft, obese and normal bowel sounds BACK: Back symmetric, no curvature. EXTREMITIES:less then 2 second capillary refill, no joint deformities, effusion, or inflammation, no skin discoloration  NEURO: alert & oriented x 3 with fluent speech, no focal motor/sensory deficits   LABORATORY DATA: CBC    Component Value Date/Time   WBC 7.4 05/16/2015 1105   RBC 4.00* 05/16/2015 1105   HGB 12.7* 05/16/2015 1105   HCT 37.3* 05/16/2015 1105   PLT 165 05/16/2015 1105   MCV 93.3 05/16/2015 1105   MCH 31.8 05/16/2015 1105   MCHC 34.0 05/16/2015 1105  RDW 13.6 05/16/2015 1105   LYMPHSABS 1.5 05/16/2015 1105   MONOABS 0.7 05/16/2015 1105   EOSABS 0.3 05/16/2015 1105   BASOSABS 0.0 05/16/2015 1105      Chemistry      Component Value Date/Time   NA 137 05/16/2015 1105   K 3.5 05/16/2015 1105   CL 102 05/16/2015 1105   CO2 27 05/16/2015 1105   BUN 9 05/16/2015 1105   CREATININE 0.92 05/16/2015 1105      Component Value Date/Time   CALCIUM 8.4* 05/16/2015 1105   ALKPHOS 78 05/16/2015 1105   AST 27 05/16/2015 1105   ALT 11* 05/16/2015 1105   BILITOT 1.0 05/16/2015 1105      Lab Results  Component Value Date   CEA 3.0 04/27/2015     RADIOGRAPHIC STUDIES:  Dg Chest Port 1 View  05/10/2015   CLINICAL DATA:  Postop for Port-A-Cath placement.  EXAM: PORTABLE CHEST -  1 VIEW  COMPARISON:  03/29/2015.  FINDINGS: Left anterior chest wall Port-A-Cath has its catheter tip in the mid superior vena cava near the confluence with the left brachiocephalic vein.  There is no pneumothorax.  Cardiac silhouette is normal in size. No mediastinal or hilar masses. Lungs are clear.  IMPRESSION: 1. No acute cardiopulmonary disease. 2. No pneumothorax. Port-A-Cath tip projects in the mid superior vena cava.   Electronically Signed   By: Lajean Manes M.D.   On: 05/10/2015 12:24   Dg C-arm 1-60 Min-no Report  05/10/2015   CLINICAL DATA: port a cath placement   C-ARM 1-60 MINUTES  Fluoroscopy was utilized by the requesting physician.  No radiographic  interpretation.       ASSESSMENT AND PLAN:  Colon carcinoma Stage IIA adenocarcinoma of colon, S/P definitive surgery by Dr. Arnoldo Morale on 04/03/2015, and now undergoing adjuvant FOLFOX chemotherapy for high risk factors.  Treatment will provide an 8% absolute survival advantage and the patient is interested in pursuing systemic chemotherapy.  Oxaliplatin dose is reduced by 20% beginning on cycle #2.  Oncology history updated today.  Pre-chemo labs today: CBC diff, CMET  Tolerating Lexapro well without any side effects.  Patient reports improvement after 1 week of therapy.  Return in 2 weeks for follow-up, pre-chemotherapy labs, and cycle 2 of chemotherapy.   THERAPY PLAN:  Beginning adjuvant FOLFOX chemotherapy today.  We will manage toxicities if they arise.  All questions were answered. The patient knows to call the clinic with any problems, questions or concerns. We can certainly see the patient much sooner if necessary.  Patient and plan discussed with Dr. Ancil Linsey and she is in agreement with the aforementioned.   This note is electronically signed by: Robynn Pane 05/31/2015 9:27 AM

## 2015-05-31 NOTE — Patient Instructions (Signed)
[place] at [facility] Discharge Instructions  RECOMMENDATIONS MADE BY THE CONSULTANT AND ANY TEST RESULTS WILL BE SENT TO YOUR REFERRING PHYSICIAN.  Exam completed by tom Kefalas today Chemotherapy today as scheduled, decrease your dose of oxaliplatin by 20% Return in 2 weeks for follow up with the doctor and treatment again. Return in 2 days to have pump removed. Please call the clinic if you have any questions or concerns  Thank you for choosing [place] at [facility] to provide your oncology and hematology care.  To afford each patient quality time with our provider, please arrive at least 15 minutes before your scheduled appointment time.    You need to re-schedule your appointment should you arrive 10 or more minutes late.  We strive to give you quality time with our providers, and arriving late affects you and other patients whose appointments are after yours.  Also, if you no show three or more times for appointments you may be dismissed from the clinic at the providers discretion.     Again, thank you for choosing [facility].  Our hope is that these requests will decrease the amount of time that you wait before being seen by our physicians.       _____________________________________________________________  Should you have questions after your visit to [facility], please contact our office at [phone] between the hours of 8:30 a.m. and 4:30 p.m.  Voicemails left after 4:30 p.m. will not be returned until the following business day.  For prescription refill requests, have your pharmacy contact our office.

## 2015-05-31 NOTE — Assessment & Plan Note (Addendum)
Stage IIA adenocarcinoma of colon, S/P definitive surgery by Dr. Arnoldo Morale on 04/03/2015, and now undergoing adjuvant FOLFOX chemotherapy for high risk factors.  Treatment will provide an 8% absolute survival advantage and the patient is interested in pursuing systemic chemotherapy.  Oxaliplatin dose is reduced by 20% beginning on cycle #2.  Oncology history updated today.  Pre-chemo labs today: CBC diff, CMET  Tolerating Lexapro well without any side effects.  Patient reports improvement after 1 week of therapy.  Return in 2 weeks for follow-up, pre-chemotherapy labs, and cycle 2 of chemotherapy.

## 2015-05-31 NOTE — Patient Instructions (Signed)
[facility] Discharge Instructions for Patients Receiving Chemotherapy  Today you received the following chemotherapy agents:  Oxaliplatin, leucovorin, and 5FU  To help prevent nausea and vomiting after your treatment, we encourage you to take your nausea medication as prescribed.   If you develop nausea and vomiting, or diarrhea that is not controlled by your medication, call the clinic.  The clinic phone number is [phone]. Office hours are Monday-Friday 8:30am-5:00pm.  BELOW ARE SYMPTOMS THAT SHOULD BE REPORTED IMMEDIATELY:  *FEVER GREATER THAN 101.0 F  *CHILLS WITH OR WITHOUT FEVER  NAUSEA AND VOMITING THAT IS NOT CONTROLLED WITH YOUR NAUSEA MEDICATION  *UNUSUAL SHORTNESS OF BREATH  *UNUSUAL BRUISING OR BLEEDING  TENDERNESS IN MOUTH AND THROAT WITH OR WITHOUT PRESENCE OF ULCERS  *URINARY PROBLEMS  *BOWEL PROBLEMS  UNUSUAL RASH Items with * indicate a potential emergency and should be followed up as soon as possible. If you have an emergency after office hours please contact your primary care physician or go to the nearest emergency department.  Please call the clinic during office hours if you have any questions or concerns.   You may also contact the Patient Navigator at [phone] should you have any questions or need assistance in obtaining follow up care. _____________________________________________________________________ Have you asked about our STAR program?    STAR stands for Survivorship Training and Rehabilitation, and this is a nationally recognized cancer care program that focuses on survivorship and rehabilitation.  Cancer and cancer treatments may cause problems, such as, pain, making you feel tired and keeping you from doing the things that you need or want to do. Cancer rehabilitation can help. Our goal is to reduce these troubling effects and help you have the best quality of life possible.  You may receive a survey from a nurse  that asks questions about your current state of health.  Based on the survey results, all eligible patients will be referred to the [facility] program for an evaluation so we can better serve you! A frequently asked questions sheet is available upon request.

## 2015-06-02 ENCOUNTER — Encounter (HOSPITAL_BASED_OUTPATIENT_CLINIC_OR_DEPARTMENT_OTHER): Payer: Medicare Other

## 2015-06-02 ENCOUNTER — Encounter (HOSPITAL_COMMUNITY): Payer: Self-pay

## 2015-06-02 VITALS — BP 138/75 | HR 81 | Temp 97.7°F | Resp 18

## 2015-06-02 DIAGNOSIS — C189 Malignant neoplasm of colon, unspecified: Secondary | ICD-10-CM

## 2015-06-02 DIAGNOSIS — C186 Malignant neoplasm of descending colon: Secondary | ICD-10-CM | POA: Diagnosis not present

## 2015-06-02 DIAGNOSIS — Z452 Encounter for adjustment and management of vascular access device: Secondary | ICD-10-CM | POA: Diagnosis present

## 2015-06-02 MED ORDER — HEPARIN SOD (PORK) LOCK FLUSH 100 UNIT/ML IV SOLN
500.0000 [IU] | Freq: Once | INTRAVENOUS | Status: AC | PRN
  Administered 2015-06-02: 500 [IU]

## 2015-06-02 MED ORDER — SODIUM CHLORIDE 0.9 % IJ SOLN
10.0000 mL | INTRAMUSCULAR | Status: DC | PRN
  Administered 2015-06-02: 10 mL
  Filled 2015-06-02: qty 10

## 2015-06-02 MED ORDER — HEPARIN SOD (PORK) LOCK FLUSH 100 UNIT/ML IV SOLN
INTRAVENOUS | Status: AC
  Filled 2015-06-02: qty 5

## 2015-06-02 NOTE — Progress Notes (Signed)
[name] Here for pump removal and port flushed. Flushed with 20 mL normal saline and 5 mL of heparin. Flushed well and blood return noted. States that he done well with the treatment. Discharged ambulatory

## 2015-06-02 NOTE — Patient Instructions (Signed)
[facility] at [facility] Discharge Instructions  RECOMMENDATIONS MADE BY THE CONSULTANT AND ANY TEST RESULTS WILL BE SENT TO YOUR REFERRING PHYSICIAN.  Pump removed and port flushed Follow up as scheduled  Please call the clinic if you have any questions or concerns  Thank you for choosing [facility] at [facility] to provide your oncology and hematology care.  To afford each patient quality time with our provider, please arrive at least 15 minutes before your scheduled appointment time.    You need to re-schedule your appointment should you arrive 10 or more minutes late.  We strive to give you quality time with our providers, and arriving late affects you and other patients whose appointments are after yours.  Also, if you no show three or more times for appointments you may be dismissed from the clinic at the providers discretion.     Again, thank you for choosing [facility].  Our hope is that these requests will decrease the amount of time that you wait before being seen by our physicians.       _____________________________________________________________  Should you have questions after your visit to [facility], please contact our office at [phone] between the hours of 8:30 a.m. and 4:30 p.m.  Voicemails left after 4:30 p.m. will not be returned until the following business day.  For prescription refill requests, have your pharmacy contact our office.     

## 2015-06-05 ENCOUNTER — Ambulatory Visit (HOSPITAL_COMMUNITY): Payer: Medicare Other | Attending: Hematology & Oncology | Admitting: Physical Therapy

## 2015-06-05 DIAGNOSIS — R262 Difficulty in walking, not elsewhere classified: Secondary | ICD-10-CM

## 2015-06-05 DIAGNOSIS — R6889 Other general symptoms and signs: Secondary | ICD-10-CM | POA: Diagnosis not present

## 2015-06-05 NOTE — Patient Instructions (Signed)
KNEE: Extension, Long Arc Quad (Band)   Place band around leg and under other foot. Pull band forward until knee is straight. Hold _5__ seconds. _10__ reps per set, _1_ sets per day.   Copyright  VHI. All rights reserved.  FLEXION: Sitting - Exercise Ball (Active)   Sit, IN A CHAIR both feet flat. Lift right knee toward ceiling. Complete _1_ sets of 10_ repetitions. Perform _1 __ sessions per day.  [email or web address].exer.[email or web address]   Copyright  VHI. All rights reserved.

## 2015-06-05 NOTE — Therapy (Signed)
[place], [place], [address] Phone: [phone]   Fax:  [phone]  Physical Therapy Treatment  Patient Details  Name: [name] MRN: [id number] Date of Birth: [birth year] Referring Provider:  [name], MD  Encounter Date: 06/05/2015      PT End of Session - 06/05/15 0841    Visit Number 2   Number of Visits 6   Date for PT Re-Evaluation 06/24/15   Authorization Type Medicare   Authorization - Visit Number 2   Authorization - Number of Visits 6   Activity Tolerance Patient tolerated treatment well   Behavior During Therapy [facility] for tasks assessed/performed      Past Medical History  Diagnosis Date  . Varicose veins   . New onset atrial fibrillation 01/02/2015  . Morbid obesity 01/02/2015  . Hypertension   . Dysrhythmia     Afib- on set 12/2014  . Sleep apnea     been tested but has not received the CPAP yet  . GERD (gastroesophageal reflux disease)   . History of gout   . Cancer     colon  . Family history of colon cancer   . Family history of breast cancer in mother     Past Surgical History  Procedure Laterality Date  . Vein ligation and stripping      left leg  . Colonoscopy N/A 03/28/2015    Procedure: COLONOSCOPY;  Surgeon: [name], MD;  [facility];  Service: Endoscopy;  Laterality: N/A;  . Esophagogastroduodenoscopy N/A 03/28/2015    Procedure: ESOPHAGOGASTRODUODENOSCOPY (EGD);  Surgeon: [name], MD;  [facility];  Service: Endoscopy;  Laterality: N/A;  . Esophageal dilation N/A 03/28/2015    Procedure: ESOPHAGEAL DILATION;  Surgeon: [name], MD;  [facility];  Service: Endoscopy;  Laterality: N/A;  . Partial colectomy N/A 04/03/2015    Procedure: PARTIAL COLECTOMY;  Surgeon: [name], MD;  Location: [facility] ORS;  Service: General;  Laterality: N/A;  . Portacath placement N/A 05/10/2015    Procedure: INSERTION  PORT-A-CATH;  Surgeon: [name], MD;  Location: [facility] ORS;  Service: General;  Laterality: N/A;  left subclavian    There were no vitals filed for this visit.  Visit Diagnosis:  Decreased functional activity tolerance  Difficulty walking      Subjective Assessment - 06/05/15 0801    Subjective hasn't been feeling very good at all, been really sick from treatments and maybe from just getting sick. Can't get appetite back yet either, only ate a hamburger yesterday. Had chemo last week and will haveit again next week.    Currently in Pain? No/denies                         [facility] Adult PT Treatment/Exercise - 06/05/15 0001    Ambulation/Gait   Ambulation/Gait Yes   Ambulation Distance (Feet) 250 Feet  x 2 total 500 feet   Assistive device Straight cane   Gait Pattern Step-through pattern   Ambulation Surface Level   High Level Balance   High Level Balance Comments static standing wide to narrow base, staggered stance bilateral - each position X 2 with 20-30 second holds in parallel bars.    Knee/Hip Exercises: Aerobic   Stationary Bike Nu-step L1 X 5 minutes   Knee/Hip Exercises: Seated   Long Arc Quad Strengthening;Both;1 set;10 reps   Other Seated Knee  Exercises seated hip flexion bilateral 1 X 10                PT Education - 06/05/15 0840    Education provided Yes   Education Details HEP, ambulation progression, activity as tolerated at home.    Person(s) Educated Patient   Methods Explanation;Handout;Verbal cues   Comprehension Verbalized understanding;Need further instruction          PT Short Term Goals - 05/25/15 2111    PT SHORT TERM GOAL #1   Title Patient will be able to ambualte at 1.37m/s indicating community ambualtory status   Time 4   Period Weeks   Status New   PT SHORT TERM GOAL #2   Title Patient will be able to ambulate 52minutes without resting.    Time 4   Period Weeks   Status New   PT SHORT TERM GOAL #3   Title  Patinet will be independent with monitoring step count   Time 4   Period Weeks   Status New   PT SHORT TERM GOAL #4   Title Patient will average > 4000 steps a day   Time 4   Period Weeks   Status New           PT Long Term Goals - 05/25/15 2113    PT LONG TERM GOAL #1   Title Patient will be ebale to ambualte > 73minutes without resting   Time 8   Period Weeks   Status New   PT LONG TERM GOAL #2   Title Patient will ambualte > 10000 steps per day   Time 10   Period Weeks   Status New   PT LONG TERM GOAL #3   Title Patient will be independent with HEP and attend silver sneakers at local [place] - 06/05/15 0841    Clinical Impression Statement Patient presenting with reports of feeling sick over the last few days, unsure if from chemotherapy or just being sick. Couldn't even get out of bed on Saturday. Discussed continuing with ambulation as able during the day. Started seated exercises for patient to perform , especiallhy on days where he can't tolerate walking.    Pt will benefit from skilled therapeutic intervention in order to improve on the following deficits Abnormal gait;Difficulty walking;Decreased activity tolerance;Decreased endurance   Rehab Potential Good   PT Frequency 1x / week   PT Duration 8 weeks   PT Treatment/Interventions Gait training;Stair training;Functional mobility training;Therapeutic exercise;Balance training;Patient/family education;Energy conservation   PT Next Visit Plan continue with activity progression through ambulation and exercise program. Add 2 strengthening exercises to HEP and 2 balance exercises if tolerable.    PT Home Exercise Plan Working seated exercises and anticipate progressing as tolerated   Consulted and Agree with Plan of Care Patient        Problem List Patient Active Problem List   Diagnosis Date Noted  . Family history of colon cancer   . Family history of breast cancer in mother   . Colon  carcinoma 04/03/2015  . Occult blood in stools   . Dysphagia, pharyngoesophageal phase   . Melena 03/15/2015  . Heme positive stool 03/15/2015  . Esophageal dysphagia 03/15/2015  . Diarrhea 03/15/2015  . New onset atrial fibrillation 01/02/2015  . Morbid obesity 01/02/2015  . Varicose veins of lower extremities with other complications 31/51/7616    Cassell Clement, PT, CSCS  06/05/2015, 8:46 AM  [place], [place], [address] Phone: [phone]   Fax:  [phone]

## 2015-06-13 ENCOUNTER — Encounter (HOSPITAL_BASED_OUTPATIENT_CLINIC_OR_DEPARTMENT_OTHER): Payer: Medicare Other

## 2015-06-13 ENCOUNTER — Encounter (HOSPITAL_COMMUNITY): Payer: Self-pay | Admitting: Hematology & Oncology

## 2015-06-13 ENCOUNTER — Encounter (HOSPITAL_BASED_OUTPATIENT_CLINIC_OR_DEPARTMENT_OTHER): Payer: Medicare Other | Admitting: Hematology & Oncology

## 2015-06-13 VITALS — BP 118/73 | HR 83 | Temp 97.6°F | Resp 18 | Wt 275.5 lb

## 2015-06-13 DIAGNOSIS — C186 Malignant neoplasm of descending colon: Secondary | ICD-10-CM

## 2015-06-13 DIAGNOSIS — R634 Abnormal weight loss: Secondary | ICD-10-CM | POA: Diagnosis not present

## 2015-06-13 DIAGNOSIS — C189 Malignant neoplasm of colon, unspecified: Secondary | ICD-10-CM

## 2015-06-13 DIAGNOSIS — Z800 Family history of malignant neoplasm of digestive organs: Secondary | ICD-10-CM

## 2015-06-13 DIAGNOSIS — C187 Malignant neoplasm of sigmoid colon: Secondary | ICD-10-CM | POA: Diagnosis not present

## 2015-06-13 DIAGNOSIS — F4321 Adjustment disorder with depressed mood: Secondary | ICD-10-CM | POA: Diagnosis not present

## 2015-06-13 LAB — COMPREHENSIVE METABOLIC PANEL
ALT: 18 U/L (ref 17–63)
ANION GAP: 8 (ref 5–15)
AST: 29 U/L (ref 15–41)
Albumin: 2.9 g/dL — ABNORMAL LOW (ref 3.5–5.0)
Alkaline Phosphatase: 101 U/L (ref 38–126)
BUN: 9 mg/dL (ref 6–20)
CHLORIDE: 104 mmol/L (ref 101–111)
CO2: 26 mmol/L (ref 22–32)
Calcium: 8.7 mg/dL — ABNORMAL LOW (ref 8.9–10.3)
Creatinine, Ser: 0.84 mg/dL (ref 0.61–1.24)
GLUCOSE: 111 mg/dL — AB (ref 65–99)
Potassium: 3.5 mmol/L (ref 3.5–5.1)
Sodium: 138 mmol/L (ref 135–145)
TOTAL PROTEIN: 6.6 g/dL (ref 6.5–8.1)
Total Bilirubin: 2.1 mg/dL — ABNORMAL HIGH (ref 0.3–1.2)

## 2015-06-13 LAB — CBC WITH DIFFERENTIAL/PLATELET
Basophils Absolute: 0 10*3/uL (ref 0.0–0.1)
Basophils Relative: 0 % (ref 0–1)
EOS ABS: 0.1 10*3/uL (ref 0.0–0.7)
Eosinophils Relative: 3 % (ref 0–5)
HCT: 37.2 % — ABNORMAL LOW (ref 39.0–52.0)
Hemoglobin: 13.1 g/dL (ref 13.0–17.0)
Lymphocytes Relative: 48 % — ABNORMAL HIGH (ref 12–46)
Lymphs Abs: 1.2 10*3/uL (ref 0.7–4.0)
MCH: 32 pg (ref 26.0–34.0)
MCHC: 35.2 g/dL (ref 30.0–36.0)
MCV: 90.7 fL (ref 78.0–100.0)
MONOS PCT: 13 % — AB (ref 3–12)
Monocytes Absolute: 0.3 10*3/uL (ref 0.1–1.0)
Neutro Abs: 0.9 10*3/uL — ABNORMAL LOW (ref 1.7–7.7)
Neutrophils Relative %: 36 % — ABNORMAL LOW (ref 43–77)
PLATELETS: 109 10*3/uL — AB (ref 150–400)
RBC: 4.1 MIL/uL — AB (ref 4.22–5.81)
RDW: 15.3 % (ref 11.5–15.5)
WBC: 2.5 10*3/uL — ABNORMAL LOW (ref 4.0–10.5)

## 2015-06-13 MED ORDER — SODIUM CHLORIDE 0.9 % IJ SOLN
10.0000 mL | Freq: Once | INTRAMUSCULAR | Status: AC
  Administered 2015-06-13: 10 mL via INTRAVENOUS

## 2015-06-13 MED ORDER — HEPARIN SOD (PORK) LOCK FLUSH 100 UNIT/ML IV SOLN
500.0000 [IU] | Freq: Once | INTRAVENOUS | Status: AC
  Administered 2015-06-13: 500 [IU] via INTRAVENOUS

## 2015-06-13 MED ORDER — HEPARIN SOD (PORK) LOCK FLUSH 100 UNIT/ML IV SOLN
INTRAVENOUS | Status: AC
  Filled 2015-06-13: qty 5

## 2015-06-13 NOTE — Progress Notes (Signed)
[name] presented for Portacath access and flush.  Proper placement of portacath confirmed by CXR.  Portacath located left chest wall accessed with  H 20 needle.  Good blood return present. Portacath flushed with 80ml NS and 500U/98ml Heparin and needle removed intact.  Procedure tolerated well and without incident.    Chemotherapy held today due to [place] to add neulasta with next treatment

## 2015-06-13 NOTE — Progress Notes (Signed)
Peter Bogus, MD [place]  [place] [address]  No diagnosis found.  CURRENT THERAPY: Adjuvant FOLFOX   INTERVAL HISTORY: [name] 69 y.o. male returns for followup of Stage IIA adenocarcinoma of colon, S/P definitive surgery by Dr. Arnoldo Morale on 04/03/2015, and now undergoing adjuvant FOLFOX chemotherapy.   He is here with his wife and he's feeling much better today. He has been walking, watching tv, and eating well over the past four days.  He mentions that food doesn't taste the same after chemo and the first week after treatment he experiences significant taste alterations. His weight is down another 5 lbs.    His fingers and toes aren't bothering him.  He has only had one instance of diarrhea which didn't concern him. He has had no vomiting or nausea.  He has experienced hair loss, waking up with some on the pillow in the mornings.  He has developed a cough lately, they figured it was allergies from being outside so much. He denies fever, chills or other symptoms of concern  He and his wife note a remarkable improvement in his mood and energy.     Colon carcinoma   03/28/2015 Initial Biopsy Diagnosis 1. Colon, biopsy, left descending - INVASIVE ADENOCARCINOMA. 2. Stomach, biopsy - GASTRIC BODY AND ANTRAL-TYPE MUCOSA WITH ASSOCIATED MINIMAL CHRONIC INFLAMMATION. - NO EVIDENCE OF HELICOBACTER PYLORI, INTESTINAL METAPLASIA, DYSPLASIA OR MA   03/29/2015 Imaging CT abd/pelvis- Circumferential mucosal lesion the proximal sigmoid colon consists with colorectal carcinoma.   04/03/2015 Definitive Surgery Colon, segmental resection for tumor - INVASIVE ADENOCARCINOMA, INVADING THROUGH THE MUSCULARIS PROPRIA INTO PERICOLONIC FATTY TISSUE. - FOURTEEN LYMPH NODES, NEGATIVE FOR METASTATIC CARCINOMA (0/14). - RESECTION MARGINS, NEGATIVE FOR ATYPIA OR MALIGNA   05/16/2015 -  Chemotherapy FOLFOX    05/31/2015 Treatment Plan Change Oxaliplatin dose reduced by 20%  secondary to fatigue from cycle 1 of therapy.     Past Medical History  Diagnosis Date  . Varicose veins   . New onset atrial fibrillation 01/02/2015  . Morbid obesity 01/02/2015  . Hypertension   . Dysrhythmia     Afib- on set 12/2014  . Sleep apnea     been tested but has not received the CPAP yet  . GERD (gastroesophageal reflux disease)   . History of gout   . Cancer     colon  . Family history of colon cancer   . Family history of breast cancer in mother     has Varicose veins of lower extremities with other complications; New onset atrial fibrillation; Morbid obesity; Melena; Heme positive stool; Esophageal dysphagia; Diarrhea; Occult blood in stools; Dysphagia, pharyngoesophageal phase; Colon carcinoma; Family history of colon cancer; and Family history of breast cancer in mother on his problem list.   He believes that his sister died of heart issues, she died in her sleep.   has No Known Allergies.  Current Outpatient Prescriptions on File Prior to Visit  Medication Sig Dispense Refill  . apixaban (ELIQUIS) 5 MG TABS tablet Take 5 mg by mouth daily.    [name] dextrose 5 % SOLN 1,000 mL with fluorouracil 5 GM/100ML SOLN Inject into the vein every 14 (fourteen) days. To infuse over 46 hours. To be given in near future    . escitalopram (LEXAPRO) 10 MG tablet Take one tablet daily for 5 days then increase to two tablets therafter 60 tablet 3  . LEUCOVORIN CALCIUM IV Inject into the vein every  14 (fourteen) days. To be started in near future    . lidocaine-prilocaine (EMLA) cream Apply a quarter size amount to port site 1 hour prior to chemo. Do not rub in. Cover with plastic wrap. 30 g 3  . lisinopril (PRINIVIL,ZESTRIL) 10 MG tablet Take 1 tablet (10 mg total) by mouth daily. 30 tablet 12  . omeprazole (PRILOSEC) 20 MG capsule Take 1 capsule (20 mg total) by mouth daily. 30 capsule 3  . OXALIPLATIN IV Inject into the vein every 14 (fourteen) days. To start in near future    .  HYDROcodone-acetaminophen (NORCO/VICODIN) 5-325 MG per tablet Take 1-2 tablets by mouth every 6 (six) hours as needed for moderate pain. (Patient not taking: Reported on 05/31/2015) 40 tablet 0  . ibuprofen (ADVIL,MOTRIN) 200 MG tablet Take 400 mg by mouth every 6 (six) hours as needed for mild pain or moderate pain (for knee pain).    . ondansetron (ZOFRAN) 8 MG tablet Take 1 tablet (8 mg total) by mouth every 8 (eight) hours as needed. (Patient not taking: Reported on 05/31/2015) 30 tablet 3  . prochlorperazine (COMPAZINE) 10 MG tablet Take 1 tablet (10 mg total) by mouth every 6 (six) hours as needed for nausea or vomiting. (Patient not taking: Reported on 05/31/2015) 30 tablet 3   No current facility-administered medications on file prior to visit.    Past Surgical History  Procedure Laterality Date  . Vein ligation and stripping      left leg  . Colonoscopy N/A 03/28/2015    Procedure: COLONOSCOPY;  Surgeon: [name], MD;  [facility];  Service: Endoscopy;  Laterality: N/A;  . Esophagogastroduodenoscopy N/A 03/28/2015    Procedure: ESOPHAGOGASTRODUODENOSCOPY (EGD);  Surgeon: [name], MD;  [facility];  Service: Endoscopy;  Laterality: N/A;  . Esophageal dilation N/A 03/28/2015    Procedure: ESOPHAGEAL DILATION;  Surgeon: [name], MD;  [facility];  Service: Endoscopy;  Laterality: N/A;  . Partial colectomy N/A 04/03/2015    Procedure: PARTIAL COLECTOMY;  Surgeon: [name], MD;  Location: [facility];  Service: General;  Laterality: N/A;  . Portacath placement N/A 05/10/2015    Procedure: INSERTION PORT-A-CATH;  Surgeon: [name], MD;  Location: [facility];  Service: General;  Laterality: N/A;  left subclavian    Denies any headaches, dizziness, double vision, fevers, chills, night sweats, nausea, vomiting, diarrhea, constipation, chest pain, heart palpitations, shortness of breath, blood in stool, black tarry stool, urinary pain, urinary  burning, urinary frequency, hematuria. Positive for appetite loss. Positive for weight loss.    Appetite loss due to chemo causing food to taste different.   PHYSICAL EXAMINATION  ECOG PERFORMANCE STATUS: 1 - Symptomatic but completely ambulatory  Filed Vitals:   06/13/15 0825  BP: 118/73  Pulse: 83  Temp: 97.6 F (36.4 C)  Resp: 18    GENERAL:alert, no distress, well nourished, well developed, comfortable, cooperative, obese. Mood is excellent today. He is smiling and very talkative SKIN: skin color, texture, turgor are normal, no rashes or significant lesions HEAD: Normocephalic, No masses, lesions, tenderness or abnormalities EYES: normal, PERRLA, EOMI, Conjunctiva are pink and non-injected EARS: External ears normal OROPHARYNX:lips, buccal mucosa, and tongue normal and mucous membranes are moist  NECK: supple, thyroid normal size, non-tender, without nodularity, trachea midline LYMPH:  not examined BREAST:not examined LUNGS: clear to auscultation      Coarse rhonchi, cleared with cough HEART: regular rate & rhythm ABDOMEN:abdomen soft, obese  and normal bowel sounds BACK: Back symmetric, no curvature. EXTREMITIES:less then 2 second capillary refill, no joint deformities, effusion, or inflammation, no skin discoloration      Left lower extremity is larger than the right. (chronic) NEURO: alert & oriented x 3 with fluent speech, no focal motor/sensory deficits  LABORATORY DATA:  CBC    Component Value Date/Time   WBC 2.5* 06/13/2015 0846   RBC 4.10* 06/13/2015 0846   HGB 13.1 06/13/2015 0846   HCT 37.2* 06/13/2015 0846   PLT 109* 06/13/2015 0846   MCV 90.7 06/13/2015 0846   MCH 32.0 06/13/2015 0846   MCHC 35.2 06/13/2015 0846   RDW 15.3 06/13/2015 0846   LYMPHSABS 1.2 06/13/2015 0846   MONOABS 0.3 06/13/2015 0846   EOSABS 0.1 06/13/2015 0846   BASOSABS 0.0 06/13/2015 0846      Chemistry      Component Value Date/Time   NA 135 05/31/2015 0910   K 3.4*  05/31/2015 0910   CL 102 05/31/2015 0910   CO2 24 05/31/2015 0910   BUN 10 05/31/2015 0910   CREATININE 0.92 05/31/2015 0910      Component Value Date/Time   CALCIUM 8.8* 05/31/2015 0910   ALKPHOS 93 05/31/2015 0910   AST 25 05/31/2015 0910   ALT 16* 05/31/2015 0910   BILITOT 1.7* 05/31/2015 0910      Lab Results  Component Value Date   CEA 3.0 04/27/2015    ASSESSMENT AND PLAN:  Stage II colorectal cancer Adjuvant FOLFOX Family history of colon cancer Situational depression/anxiety  He is much improved today. His activity is markedly increased. His appetite also appears to be improving. I would like to see his weight stabilizes and I discussed [place] with he and his wife today. We will continue to monitor his weight closely over the next cycle or 2.    He is to continue his anti-depressant. His mood is markedly improved.  He seems to be tolerating chemotherapy very well. His platelet count is 109,000 today and we may need to eliminate his 5-FU bolus but we will reassess this at his next follow-up. They were advised to call prior to follow-up should his cough worsen or he develop any other symptoms of concern.  He will follow-up in 2 weeks.  All questions were answered. The patient knows to call the clinic with any problems, questions or concerns. We can certainly see the patient much sooner if necessary.  This document serves as a record of services personally performed by Ancil Linsey, MD. It was created on her behalf by Arlyce Harman, a trained medical scribe. The creation of this record is based on the scribe's personal observations and the provider's statements to them. This document has been checked and approved by the attending provider.  I have reviewed the above documentation for accuracy and completeness, and I agree with the above. This note was electronically signed. Molli Hazard, MD

## 2015-06-13 NOTE — Patient Instructions (Addendum)
[facility] Discharge Instructions for Patients Receiving Chemotherapy  Today you received the following chemotherapy agents We are holding your chemotherapy today Follow up as scheduled, return next Tuesday for treatment. Please call the clinic if you have any questions or concerns  To help prevent nausea and vomiting after your treatment, we encourage you to take your nausea medication    If you develop nausea and vomiting, or diarrhea that is not controlled by your medication, call the clinic.  The clinic phone number is [phone]. Office hours are Monday-Friday 8:30am-5:00pm.  BELOW ARE SYMPTOMS THAT SHOULD BE REPORTED IMMEDIATELY:  *FEVER GREATER THAN 101.0 F  *CHILLS WITH OR WITHOUT FEVER  NAUSEA AND VOMITING THAT IS NOT CONTROLLED WITH YOUR NAUSEA MEDICATION  *UNUSUAL SHORTNESS OF BREATH  *UNUSUAL BRUISING OR BLEEDING  TENDERNESS IN MOUTH AND THROAT WITH OR WITHOUT PRESENCE OF ULCERS  *URINARY PROBLEMS  *BOWEL PROBLEMS  UNUSUAL RASH Items with * indicate a potential emergency and should be followed up as soon as possible. If you have an emergency after office hours please contact your primary care physician or go to the nearest emergency department.  Please call the clinic during office hours if you have any questions or concerns.   You may also contact the Patient Navigator at [phone] should you have any questions or need assistance in obtaining follow up care. _____________________________________________________________________ Have you asked about our STAR program?    STAR stands for Survivorship Training and Rehabilitation, and this is a nationally recognized cancer care program that focuses on survivorship and rehabilitation.  Cancer and cancer treatments may cause problems, such as, pain, making you feel tired and keeping you from doing the things that you need or want to do. Cancer rehabilitation can help. Our goal is to reduce these  troubling effects and help you have the best quality of life possible.  You may receive a survey from a nurse that asks questions about your current state of health.  Based on the survey results, all eligible patients will be referred to the [facility] program for an evaluation so we can better serve you! A frequently asked questions sheet is available upon request.

## 2015-06-13 NOTE — Patient Instructions (Signed)
[place] at [facility] Discharge Instructions  RECOMMENDATIONS MADE BY THE CONSULTANT AND ANY TEST RESULTS WILL BE SENT TO YOUR REFERRING PHYSICIAN.  Exam and discussion by Dr. Whitney Muse. Report uncontrolled nausea, vomiting, diarrhea, mouth sores or other concerns. If blood counts are ok you will be treated today.  Follow-up in 2 weeks with chemotherapy and office visit.  Thank you for choosing [place] at [facility] to provide your oncology and hematology care.  To afford each patient quality time with our provider, please arrive at least 15 minutes before your scheduled appointment time.    You need to re-schedule your appointment should you arrive 10 or more minutes late.  We strive to give you quality time with our providers, and arriving late affects you and other patients whose appointments are after yours.  Also, if you no show three or more times for appointments you may be dismissed from the clinic at the providers discretion.     Again, thank you for choosing [facility].  Our hope is that these requests will decrease the amount of time that you wait before being seen by our physicians.       _____________________________________________________________  Should you have questions after your visit to [facility], please contact our office at [phone] between the hours of 8:30 a.m. and 4:30 p.m.  Voicemails left after 4:30 p.m. will not be returned until the following business day.  For prescription refill requests, have your pharmacy contact our office.

## 2015-06-14 ENCOUNTER — Other Ambulatory Visit (HOSPITAL_COMMUNITY): Payer: Self-pay | Admitting: Hematology & Oncology

## 2015-06-15 ENCOUNTER — Ambulatory Visit (HOSPITAL_COMMUNITY): Payer: Medicare Other

## 2015-06-15 ENCOUNTER — Encounter (HOSPITAL_COMMUNITY): Payer: Medicare Other

## 2015-06-15 ENCOUNTER — Telehealth (HOSPITAL_COMMUNITY): Payer: Self-pay | Admitting: *Deleted

## 2015-06-16 ENCOUNTER — Encounter: Payer: Self-pay | Admitting: Dietician

## 2015-06-16 ENCOUNTER — Other Ambulatory Visit (HOSPITAL_COMMUNITY): Payer: Self-pay

## 2015-06-16 NOTE — Progress Notes (Signed)
Followed up with pt in light of further weight loss and reported taste changes.   Contacted Pt by phone  Wt Readings from Last 10 Encounters:  06/13/15 275 lb 8 oz (124.966 kg)  05/31/15 280 lb 4.8 oz (127.143 kg)  05/25/15 278 lb (126.1 kg)  05/16/15 285 lb (129.275 kg)  05/05/15 284 lb (128.822 kg)  04/27/15 281 lb 3.2 oz (127.551 kg)  04/03/15 308 lb (139.708 kg)  03/31/15 308 lb (139.708 kg)  03/15/15 308 lb 6.4 oz (139.889 kg)  03/13/15 304 lb (137.893 kg)   Patient weight has Decreased by about 6 lbs since I talked to him 1.5 months ago.  Patient reports oral intake as fair and is suffering from symptoms including a reduced appetite and taste changes.  I had read in notes that pt had been suffering from a lot of depression, anxiety, and stress recently. Fortunately, when I spoke with him he sounded great and was laughing with me. He seems to be in a much better mental state now.  Pt states that he is eating 2 meals a day with some snacks. He reports snacking on popcorn, PB, Cheese, and cookies. Directed him to  The snacks that have more protein/calories. He says he has not yet tried Ensure/Boost.  He denies any bitter/metaalic tastes. He thinks the taste changes he has are related to the Polident he uses to hold his dentures in. It does not sound like his taste changes are affecting how much he is eating.   He also denies n/v/c/d. Does note strong aversion to cold after chemo.   Encouraged pt to do the best he can.Pt seemed to be thankful for the check up.   Mailed coupons and handouts titled "Poor Appetite" and "Soft and Moist High Protein Menu Ideas".   Burtis Junes RD, LDN Nutrition Pager: [phone] 06/16/2015 11:23 AM

## 2015-06-16 NOTE — Telephone Encounter (Signed)
Prescription called into [facility]. Patient's wife notified.

## 2015-06-16 NOTE — Telephone Encounter (Signed)
Please call in Z pack, use as prescribed. Dr.P

## 2015-06-19 ENCOUNTER — Ambulatory Visit (HOSPITAL_COMMUNITY): Payer: Medicare Other | Admitting: Physical Therapy

## 2015-06-19 DIAGNOSIS — R6889 Other general symptoms and signs: Secondary | ICD-10-CM

## 2015-06-19 DIAGNOSIS — R262 Difficulty in walking, not elsewhere classified: Secondary | ICD-10-CM

## 2015-06-19 NOTE — Therapy (Signed)
[place], [place], [address] Phone: [phone]   Fax:  [phone]  Physical Therapy Treatment  Patient Details  Name: [name] MRN: [id number] Date of Birth: 24-Aug-1946 Referring Provider:  [name], MD  Encounter Date: 06/19/2015      PT End of Session - 06/19/15 1005    Visit Number 3   Number of Visits 6   Date for PT Re-Evaluation 06/24/15   Authorization Type Medicare   Authorization - Visit Number 3   Authorization - Number of Visits 6   PT Start Time 0933   PT Stop Time 1012   PT Time Calculation (min) 39 min   Activity Tolerance Patient tolerated treatment well;Patient limited by fatigue   Behavior During Therapy [facility] for tasks assessed/performed      Past Medical History  Diagnosis Date  . Varicose veins   . New onset atrial fibrillation 01/02/2015  . Morbid obesity 01/02/2015  . Hypertension   . Dysrhythmia     Afib- on set 12/2014  . Sleep apnea     been tested but has not received the CPAP yet  . GERD (gastroesophageal reflux disease)   . History of gout   . Cancer     colon  . Family history of colon cancer   . Family history of breast cancer in mother     Past Surgical History  Procedure Laterality Date  . Vein ligation and stripping      left leg  . Colonoscopy N/A 03/28/2015    Procedure: COLONOSCOPY;  Surgeon: [name], MD;  [facility];  Service: Endoscopy;  Laterality: N/A;  . Esophagogastroduodenoscopy N/A 03/28/2015    Procedure: ESOPHAGOGASTRODUODENOSCOPY (EGD);  Surgeon: [name], MD;  [facility];  Service: Endoscopy;  Laterality: N/A;  . Esophageal dilation N/A 03/28/2015    Procedure: ESOPHAGEAL DILATION;  Surgeon: [name], MD;  [facility];  Service: Endoscopy;  Laterality: N/A;  . Partial colectomy N/A 04/03/2015    Procedure: PARTIAL COLECTOMY;  Surgeon: [name], MD;  Location: [facility];   Service: General;  Laterality: N/A;  . Portacath placement N/A 05/10/2015    Procedure: INSERTION PORT-A-CATH;  Surgeon: [name], MD;  Location: [facility];  Service: General;  Laterality: N/A;  left subclavian    There were no vitals filed for this visit.  Visit Diagnosis:  Decreased functional activity tolerance  Difficulty walking      Subjective Assessment - 06/19/15 0936    Subjective Patient states he is feeling much better, not having as many problems with energy recently but also did not take his chemo treatment last week due to low platelets    Pertinent History Patient had cancer surgery in april 2016 to removed a18inches of colon, mD beleives they removed all of colon, Patient began Chemo 05/17/15. patient had surgery 20 years ago to "strip veins" and has sicne had swelling in both legs. patint enjos shooting pool, patient states limited physical activity due to bad knee and limited energy levels noting "I can t walk across the road."  "I feel worn out to where i can sit up for a while then have to sit back down and rest.  little pain with sit to stance.    Currently in Pain? No/denies  mild headache  [facility] Adult PT Treatment/Exercise - 06/19/15 0001    Knee/Hip Exercises: Stretches   Passive Hamstring Stretch 2 reps;30 seconds   Passive Hamstring Stretch Limitations supine    Piriformis Stretch 2 reps;30 seconds   Piriformis Stretch Limitations supine    Gastroc Stretch 3 reps;30 seconds   Gastroc Stretch Limitations slantboard    Knee/Hip Exercises: Aerobic   Stationary Bike Nustep seat 10 level 1 x 7 minutes    Knee/Hip Exercises: Standing   Heel Raises 1 set;10 reps   Heel Raises Limitations floor    Knee Flexion Both;10 reps;2 sets   Knee Flexion Limitations 2#   Other Standing Knee Exercises Standing hip ABD 2x74ft    Other Standing Knee Exercises Sit to stand with eccentric lower 1x10    Knee/Hip Exercises: Supine    Bridges Both;1 set;10 reps   Bridges Limitations supine              Balance Exercises - 06/19/15 0947    Balance Exercises: Standing   Tandem Stance Eyes open;3 reps;15 secs   SLS Eyes open;4 reps;10 secs   Rockerboard Other (comment)  x20 AP, x20 lateral with U HHA            PT Education - 06/19/15 1012    Education provided No          PT Short Term Goals - 05/25/15 2111    PT SHORT TERM GOAL #1   Title Patient will be able to ambualte at 1.76m/s indicating community ambualtory status   Time 4   Period Weeks   Status New   PT SHORT TERM GOAL #2   Title Patient will be able to ambulate 68minutes without resting.    Time 4   Period Weeks   Status New   PT SHORT TERM GOAL #3   Title Patinet will be independent with monitoring step count   Time 4   Period Weeks   Status New   PT SHORT TERM GOAL #4   Title Patient will average > 4000 steps a day   Time 4   Period Weeks   Status New           PT Long Term Goals - 05/25/15 2113    PT LONG TERM GOAL #1   Title Patient will be ebale to ambualte > 71minutes without resting   Time 8   Period Weeks   Status New   PT LONG TERM GOAL #2   Title Patient will ambualte > 10000 steps per day   Time 10   Period Weeks   Status New   PT LONG TERM GOAL #3   Title Patient will be independent with HEP and attend silver sneakers at local [employer]               Plan - 06/19/15 1006    Clinical Impression Statement Patient presented reported that he has been feeling much better recently but that this may also be because he did not receive cancer treatment last session due to low platelets. Performed functional stretches, strengthening, balance, and endurance tasks with intermittent rest breaks as needed by patient. Patient appeared to tolerated today's session well.    Pt will benefit from skilled therapeutic intervention in order to improve on the following deficits Abnormal gait;Difficulty walking;Decreased  activity tolerance;Decreased endurance   Rehab Potential Good   PT Frequency 1x / week   PT Duration 8 weeks   PT Treatment/Interventions Gait training;Stair training;Functional mobility training;Therapeutic exercise;Balance training;Patient/family  education;Energy conservation   PT Next Visit Plan continue with activity progression through ambulation and exercise program. Add 2 strengthening exercises to HEP and 2 balance exercises if tolerable.    PT Home Exercise Plan Working seated exercises and anticipate progressing as tolerated   Consulted and Agree with Plan of Care Patient        Problem List Patient Active Problem List   Diagnosis Date Noted  . Family history of colon cancer   . Family history of breast cancer in mother   . Colon carcinoma 04/03/2015  . Occult blood in stools   . Dysphagia, pharyngoesophageal phase   . Melena 03/15/2015  . Heme positive stool 03/15/2015  . Esophageal dysphagia 03/15/2015  . Diarrhea 03/15/2015  . New onset atrial fibrillation 01/02/2015  . Morbid obesity 01/02/2015  . Varicose veins of lower extremities with other complications 19/50/9326    Deniece Ree PT, DPT [place] [address] [place], [place], [address] Phone: [phone]   Fax:  [phone]

## 2015-06-20 ENCOUNTER — Other Ambulatory Visit (HOSPITAL_COMMUNITY): Payer: Self-pay | Admitting: Oncology

## 2015-06-20 ENCOUNTER — Encounter (HOSPITAL_BASED_OUTPATIENT_CLINIC_OR_DEPARTMENT_OTHER): Payer: Medicare Other

## 2015-06-20 ENCOUNTER — Encounter (HOSPITAL_COMMUNITY): Payer: Self-pay

## 2015-06-20 VITALS — BP 140/87 | HR 72 | Temp 98.2°F | Resp 18 | Wt 285.4 lb

## 2015-06-20 DIAGNOSIS — C189 Malignant neoplasm of colon, unspecified: Secondary | ICD-10-CM

## 2015-06-20 DIAGNOSIS — C186 Malignant neoplasm of descending colon: Secondary | ICD-10-CM | POA: Diagnosis not present

## 2015-06-20 DIAGNOSIS — Z5111 Encounter for antineoplastic chemotherapy: Secondary | ICD-10-CM

## 2015-06-20 DIAGNOSIS — E876 Hypokalemia: Secondary | ICD-10-CM

## 2015-06-20 DIAGNOSIS — C187 Malignant neoplasm of sigmoid colon: Secondary | ICD-10-CM | POA: Diagnosis not present

## 2015-06-20 LAB — CBC WITH DIFFERENTIAL/PLATELET
BASOS ABS: 0 10*3/uL (ref 0.0–0.1)
BASOS PCT: 1 % (ref 0–1)
EOS PCT: 2 % (ref 0–5)
Eosinophils Absolute: 0.1 10*3/uL (ref 0.0–0.7)
HEMATOCRIT: 36.7 % — AB (ref 39.0–52.0)
HEMOGLOBIN: 12.7 g/dL — AB (ref 13.0–17.0)
Lymphocytes Relative: 32 % (ref 12–46)
Lymphs Abs: 1.2 10*3/uL (ref 0.7–4.0)
MCH: 32.9 pg (ref 26.0–34.0)
MCHC: 34.6 g/dL (ref 30.0–36.0)
MCV: 95.1 fL (ref 78.0–100.0)
MONOS PCT: 24 % — AB (ref 3–12)
Monocytes Absolute: 0.9 10*3/uL (ref 0.1–1.0)
Neutro Abs: 1.6 10*3/uL — ABNORMAL LOW (ref 1.7–7.7)
Neutrophils Relative %: 42 % — ABNORMAL LOW (ref 43–77)
Platelets: 162 10*3/uL (ref 150–400)
RBC: 3.86 MIL/uL — AB (ref 4.22–5.81)
RDW: 17.1 % — ABNORMAL HIGH (ref 11.5–15.5)
WBC: 3.9 10*3/uL — ABNORMAL LOW (ref 4.0–10.5)

## 2015-06-20 LAB — COMPREHENSIVE METABOLIC PANEL
ALBUMIN: 2.7 g/dL — AB (ref 3.5–5.0)
ALT: 19 U/L (ref 17–63)
AST: 36 U/L (ref 15–41)
Alkaline Phosphatase: 116 U/L (ref 38–126)
Anion gap: 7 (ref 5–15)
BILIRUBIN TOTAL: 1.2 mg/dL (ref 0.3–1.2)
BUN: 9 mg/dL (ref 6–20)
CALCIUM: 8.3 mg/dL — AB (ref 8.9–10.3)
CHLORIDE: 104 mmol/L (ref 101–111)
CO2: 25 mmol/L (ref 22–32)
CREATININE: 0.88 mg/dL (ref 0.61–1.24)
GFR calc Af Amer: >60 mL/min (ref >60)
Glucose, Bld: 162 mg/dL — ABNORMAL HIGH (ref 65–99)
Potassium: 3.4 mmol/L — ABNORMAL LOW (ref 3.5–5.1)
Sodium: 136 mmol/L (ref 135–145)
TOTAL PROTEIN: 6.5 g/dL (ref 6.5–8.1)

## 2015-06-20 MED ORDER — SODIUM CHLORIDE 0.9 % IV SOLN
2400.0000 mg/m2 | INTRAVENOUS | Status: DC
  Administered 2015-06-20: 5850 mg via INTRAVENOUS
  Filled 2015-06-20: qty 117

## 2015-06-20 MED ORDER — DEXTROSE 5 % IV SOLN
Freq: Once | INTRAVENOUS | Status: AC
  Administered 2015-06-20: 11:00:00 via INTRAVENOUS

## 2015-06-20 MED ORDER — POTASSIUM CHLORIDE CRYS ER 20 MEQ PO TBCR: 20.0000 meq | EXTENDED_RELEASE_TABLET | Freq: Two times a day (BID) | ORAL | Status: DC

## 2015-06-20 MED ORDER — SODIUM CHLORIDE 0.9 % IJ SOLN
10.0000 mL | INTRAMUSCULAR | Status: DC | PRN
  Administered 2015-06-20: 10 mL
  Filled 2015-06-20: qty 10

## 2015-06-20 MED ORDER — OXALIPLATIN CHEMO INJECTION 100 MG/20ML
68.0000 mg/m2 | Freq: Once | INTRAVENOUS | Status: AC
  Administered 2015-06-20: 165 mg via INTRAVENOUS
  Filled 2015-06-20: qty 33

## 2015-06-20 MED ORDER — FLUOROURACIL CHEMO INJECTION 2.5 GM/50ML
400.0000 mg/m2 | Freq: Once | INTRAVENOUS | Status: AC
  Administered 2015-06-20: 950 mg via INTRAVENOUS
  Filled 2015-06-20: qty 19

## 2015-06-20 MED ORDER — LEUCOVORIN CALCIUM INJECTION 350 MG
400.0000 mg/m2 | Freq: Once | INTRAVENOUS | Status: AC
  Administered 2015-06-20: 972 mg via INTRAVENOUS
  Filled 2015-06-20: qty 48.6

## 2015-06-20 MED ORDER — SODIUM CHLORIDE 0.9 % IV SOLN
Freq: Once | INTRAVENOUS | Status: AC
  Administered 2015-06-20: 12:00:00 via INTRAVENOUS
  Filled 2015-06-20: qty 4

## 2015-06-20 NOTE — Patient Instructions (Signed)
[facility] Discharge Instructions for Patients Receiving Chemotherapy  Today you received the following chemotherapy agents Oxaliplatin, Leucovorin and 5FU pump.  To help prevent nausea and vomiting after your treatment, we encourage you to take your nausea medication as instructed. If you develop nausea and vomiting that is not controlled by your nausea medication, call the clinic. If it is after clinic hours your family physician or the after hours number for the clinic or go to the Emergency Department. BELOW ARE SYMPTOMS THAT SHOULD BE REPORTED IMMEDIATELY:  *FEVER GREATER THAN 101.0 F  *CHILLS WITH OR WITHOUT FEVER  NAUSEA AND VOMITING THAT IS NOT CONTROLLED WITH YOUR NAUSEA MEDICATION  *UNUSUAL SHORTNESS OF BREATH  *UNUSUAL BRUISING OR BLEEDING  TENDERNESS IN MOUTH AND THROAT WITH OR WITHOUT PRESENCE OF ULCERS  *URINARY PROBLEMS  *BOWEL PROBLEMS  UNUSUAL RASH Items with * indicate a potential emergency and should be followed up as soon as possible.  Return Thursday apx 1pm to discontinue pump.  I have been informed and understand all the instructions given to me. I know to contact the clinic, my physician, or go to the Emergency Department if any problems should occur. I do not have any questions at this time, but understand that I may call the clinic during office hours or the Patient Navigator at [phone] should I have any questions or need assistance in obtaining follow up care.    __________________________________________  _____________  __________ Signature of Patient or Authorized Representative            Date                   Time    __________________________________________ Nurse's Signature

## 2015-06-22 ENCOUNTER — Encounter (HOSPITAL_BASED_OUTPATIENT_CLINIC_OR_DEPARTMENT_OTHER): Payer: Medicare Other

## 2015-06-22 ENCOUNTER — Encounter (HOSPITAL_COMMUNITY): Payer: Self-pay

## 2015-06-22 VITALS — BP 132/78 | HR 80 | Temp 97.8°F | Resp 18

## 2015-06-22 DIAGNOSIS — C186 Malignant neoplasm of descending colon: Secondary | ICD-10-CM

## 2015-06-22 DIAGNOSIS — Z5189 Encounter for other specified aftercare: Secondary | ICD-10-CM | POA: Diagnosis not present

## 2015-06-22 DIAGNOSIS — C189 Malignant neoplasm of colon, unspecified: Secondary | ICD-10-CM

## 2015-06-22 MED ORDER — HEPARIN SOD (PORK) LOCK FLUSH 100 UNIT/ML IV SOLN
500.0000 [IU] | Freq: Once | INTRAVENOUS | Status: AC | PRN
  Administered 2015-06-22: 500 [IU]

## 2015-06-22 MED ORDER — PEGFILGRASTIM 6 MG/0.6ML ~~LOC~~ PSKT
6.0000 mg | PREFILLED_SYRINGE | Freq: Once | SUBCUTANEOUS | Status: AC
  Administered 2015-06-22: 6 mg via SUBCUTANEOUS
  Filled 2015-06-22: qty 0.6

## 2015-06-22 MED ORDER — SODIUM CHLORIDE 0.9 % IJ SOLN
10.0000 mL | INTRAMUSCULAR | Status: DC | PRN
  Administered 2015-06-22: 10 mL
  Filled 2015-06-22: qty 10

## 2015-06-22 MED ORDER — HEPARIN SOD (PORK) LOCK FLUSH 100 UNIT/ML IV SOLN
INTRAVENOUS | Status: AC
  Filled 2015-06-22: qty 5

## 2015-06-22 NOTE — Progress Notes (Signed)
1405:  ..Peter Penna Fincanonpresents today for neulasta OBI placement per MD orders.  OBI device filled per protocol and placed on left Upper Arm. Needle/catheter placement noted prior to patient leaving. Tolerated without incident and aware of injection to be delivered in  27 hours.

## 2015-06-22 NOTE — Patient Instructions (Signed)
[place] at [facility] Discharge Instructions  RECOMMENDATIONS MADE BY THE CONSULTANT AND ANY TEST RESULTS WILL BE SENT TO YOUR REFERRING PHYSICIAN.  Pump removal and port flush today. Neulasta on-body injector placed today. Return as scheduled for chemotherapy.  Thank you for choosing [place] at [facility] to provide your oncology and hematology care.  To afford each patient quality time with our provider, please arrive at least 15 minutes before your scheduled appointment time.    You need to re-schedule your appointment should you arrive 10 or more minutes late.  We strive to give you quality time with our providers, and arriving late affects you and other patients whose appointments are after yours.  Also, if you no show three or more times for appointments you may be dismissed from the clinic at the providers discretion.     Again, thank you for choosing [facility].  Our hope is that these requests will decrease the amount of time that you wait before being seen by our physicians.       _____________________________________________________________  Should you have questions after your visit to [facility], please contact our office at [phone] between the hours of 8:30 a.m. and 4:30 p.m.  Voicemails left after 4:30 p.m. will not be returned until the following business day.  For prescription refill requests, have your pharmacy contact our office.

## 2015-06-26 ENCOUNTER — Inpatient Hospital Stay (HOSPITAL_COMMUNITY): Payer: Medicare Other

## 2015-06-26 ENCOUNTER — Ambulatory Visit (HOSPITAL_COMMUNITY): Payer: Medicare Other | Admitting: Oncology

## 2015-06-27 ENCOUNTER — Ambulatory Visit (HOSPITAL_COMMUNITY): Payer: Medicare Other | Admitting: Oncology

## 2015-06-27 ENCOUNTER — Inpatient Hospital Stay (HOSPITAL_COMMUNITY): Payer: Medicare Other

## 2015-06-28 ENCOUNTER — Encounter (HOSPITAL_COMMUNITY): Payer: Medicare Other

## 2015-06-28 ENCOUNTER — Ambulatory Visit (HOSPITAL_COMMUNITY): Payer: Medicare Other | Admitting: Physical Therapy

## 2015-06-28 DIAGNOSIS — R6889 Other general symptoms and signs: Secondary | ICD-10-CM | POA: Diagnosis not present

## 2015-06-28 DIAGNOSIS — R262 Difficulty in walking, not elsewhere classified: Secondary | ICD-10-CM

## 2015-06-28 NOTE — Patient Instructions (Signed)
   STANDING HEEL RAISES  While standing, raise up on your toes as you lift your heels off the ground. Repeat 10 times while holding onto kitchen counter, twice a day.    MINI SQUAT  Start with your feet shoulder-width apart and toes pointed straight ahead. Next, bend your knees to approximately 30 degrees of flexion to perform a mini squat as shown. Then, return to original position.   Knees should bend in line with the 2nd toe and not pass the front of the foot.   You may hold onto the kitchen counter while you are doing this exercise. Repeat at least 5 times; if this is not bothering your knees, then go ahead and try for 10. Repeat twice a day.

## 2015-06-28 NOTE — Therapy (Signed)
[place] [address] [place], [place], [address] Phone: [phone]   Fax:  [phone]  Physical Therapy Treatment (Re-Assessment)  Patient Details  Name: [name] MRN: [id number] Date of Birth: 1946/05/11 Referring Provider:  [name], MD  Encounter Date: 06/28/2015      PT End of Session - 06/28/15 1628    Visit Number 4   Number of Visits 10   Date for PT Re-Evaluation 07/26/15   Authorization Type Medicare   Authorization - Visit Number 4   Authorization - Number of Visits 14   PT Start Time 9833   PT Stop Time 1346   PT Time Calculation (min) 43 min   Activity Tolerance Patient tolerated treatment well;Patient limited by fatigue   Behavior During Therapy [facility] for tasks assessed/performed      Past Medical History  Diagnosis Date  . Varicose veins   . New onset atrial fibrillation 01/02/2015  . Morbid obesity 01/02/2015  . Hypertension   . Dysrhythmia     Afib- on set 12/2014  . Sleep apnea     been tested but has not received the CPAP yet  . GERD (gastroesophageal reflux disease)   . History of gout   . Cancer     colon  . Family history of colon cancer   . Family history of breast cancer in mother     Past Surgical History  Procedure Laterality Date  . Vein ligation and stripping      left leg  . Colonoscopy N/A 03/28/2015    Procedure: COLONOSCOPY;  Surgeon: [name], MD;  [facility];  Service: Endoscopy;  Laterality: N/A;  . Esophagogastroduodenoscopy N/A 03/28/2015    Procedure: ESOPHAGOGASTRODUODENOSCOPY (EGD);  Surgeon: [name], MD;  [facility];  Service: Endoscopy;  Laterality: N/A;  . Esophageal dilation N/A 03/28/2015    Procedure: ESOPHAGEAL DILATION;  Surgeon: [name], MD;  [facility];  Service: Endoscopy;  Laterality: N/A;  . Partial colectomy N/A 04/03/2015    Procedure: PARTIAL COLECTOMY;  Surgeon: [name], MD;   Location: [facility];  Service: General;  Laterality: N/A;  . Portacath placement N/A 05/10/2015    Procedure: INSERTION PORT-A-CATH;  Surgeon: [name], MD;  Location: [facility];  Service: General;  Laterality: N/A;  left subclavian    There were no vitals filed for this visit.  Visit Diagnosis:  Decreased functional activity tolerance  Difficulty walking      Subjective Assessment - 06/28/15 1303    Subjective Patient states he has not been feeling well lately, just due to health issues. Still trying to walk as he is able.    Pertinent History Patient had cancer surgery in april 2016 to removed a18inches of colon, mD beleives they removed all of colon, Patient began Chemo 05/17/15. patient had surgery 20 years ago to "strip veins" and has sicne had swelling in both legs. patint enjos shooting pool, patient states limited physical activity due to bad knee and limited energy levels noting "I can t walk across the road."  "I feel worn out to where i can sit up for a while then have to sit back down and rest.  little pain with sit to stance.    How long can you walk comfortably? 6/29- 4 minutes   Patient Stated Goals to have have more energy, to be able  to walk further.    Currently in Pain?  No/denies            [facility] PT Assessment - 06/28/15 0001    Strength   Right Hip Flexion 3+/5   Left Hip Flexion 3+/5   Right Knee Flexion 4/5   Right Knee Extension 5/5   Left Knee Flexion 4+/5   Left Knee Extension 4+/5   Right Ankle Dorsiflexion 5/5   Left Ankle Dorsiflexion 5/5   6 Minute Walk- Baseline   6 Minute Walk- Baseline yes   6 minute walk test results    Aerobic Endurance Distance Walked 950   Endurance additional comments 0.53m/s    Standardized Balance Assessment   Five times sit to stand comments  12.77 seconds    Timed Up and Go Test   Normal TUG (seconds) 14.31  without cane (did with the cane at initial eval)                     [facility] Adult PT  Treatment/Exercise - 06/28/15 0001    Knee/Hip Exercises: Stretches   Active Hamstring Stretch 2 reps;30 seconds   Active Hamstring Stretch Limitations 12 inch box    Piriformis Stretch 2 reps;30 seconds   Piriformis Stretch Limitations seated   Gastroc Stretch 3 reps;30 seconds   Gastroc Stretch Limitations slantboard    Knee/Hip Exercises: Aerobic   Stationary Bike Nustep seat 10 level 1 x 5 minutes   limited due to knee pain today                 PT Education - 06/28/15 1628    Education provided Yes   Education Details plan of care moving forward, possible effects on active chemo on activity and exercise tolerance    Person(s) Educated Patient   Methods Explanation   Comprehension Verbalized understanding          PT Short Term Goals - 06/28/15 1332    PT SHORT TERM GOAL #1   Title Patient will be able to ambualte at 1.67m/s indicating community ambualtory status   Time 4   Period Weeks   Status On-going   PT SHORT TERM GOAL #2   Title Patient will be able to ambulate 15minutes without resting.    Baseline 6/29- patient states he is able to ambulate around 4 minutes before fatiguing    Time 4   Period Weeks   Status On-going   PT SHORT TERM GOAL #3   Title Patinet will be independent with monitoring step count   Time 4   Period Weeks   Status Achieved   PT SHORT TERM GOAL #4   Title Patient will average > 4000 steps a day   Baseline 6/29- 1500 steps on average    Time 4   Period Weeks   Status On-going           PT Long Term Goals - 06/28/15 1334    PT LONG TERM GOAL #1   Title Patient will be ebale to ambualte > 61minutes without resting   Time 8   Period Weeks   Status On-going   PT LONG TERM GOAL #2   Title Patient will ambualte > 10000 steps per day   Time 10   Period Weeks   Status On-going   PT LONG TERM GOAL #3   Title Patient will be independent with HEP and attend silver sneakers at local [employer]   Time 8   Period Weeks   Status  On-going  Plan - July 02, 2015 1629    Clinical Impression Statement Re-assessment performed today. Patient continues to demonstrate gait and postural deviations, significantly reduced functional activity tolerance, and reduced general strength at this time. The patient reports he has been ambulating perhaps an average of 1500 steps per day and does try to do his HEP as he is able barring physical illness or significant malaise. Updated HEP today. The patient was educated on his limited progress towards goals, and does state that even though he has not had many sessions with PT that he has noticed positive changes in QOL due to PT. However at this time patient was unwilling to increase frequency to 2x/week. Patient did display increased time for functional tasks today due to fatigue during session, however it is important to note that patient has been experiencing health impairments through this time as well. At this time recommend that patient continue with skilled PT services for at least 6-8 more sessions to focus on activity tolerance, gait mechanics, strength and mobility in general.    Pt will benefit from skilled therapeutic intervention in order to improve on the following deficits Abnormal gait;Difficulty walking;Decreased activity tolerance;Decreased endurance   Rehab Potential Good   PT Frequency 1x / week   PT Duration Other (comment)  6-8 more sessions    PT Treatment/Interventions Gait training;Stair training;Functional mobility training;Therapeutic exercise;Balance training;Patient/family education;Energy conservation   PT Next Visit Plan continue with activity progression through ambulation and exercise program. Add 2 strengthening exercises to HEP and 2 balance exercises if tolerable.    PT Home Exercise Plan Working seated exercises and anticipate progressing as tolerated   Consulted and Agree with Plan of Care Patient          G-Codes - 07/02/2015 1635     Functional Assessment Tool Used Based on skilled clinical assessment of functional activity tolerance, strength, gait    Functional Limitation Mobility: Walking and moving around   Mobility: Walking and Moving Around Current Status [phone]) At least 80 percent but less than 100 percent impaired, limited or restricted   Mobility: Walking and Moving Around Goal Status [phone]) At least 60 percent but less than 80 percent impaired, limited or restricted      Problem List Patient Active Problem List   Diagnosis Date Noted  . Family history of colon cancer   . Family history of breast cancer in mother   . Colon carcinoma 04/03/2015  . Occult blood in stools   . Dysphagia, pharyngoesophageal phase   . Melena 03/15/2015  . Heme positive stool 03/15/2015  . Esophageal dysphagia 03/15/2015  . Diarrhea 03/15/2015  . New onset atrial fibrillation 01/02/2015  . Morbid obesity 01/02/2015  . Varicose veins of lower extremities with other complications 32/99/2426    Physical Therapy Progress Note  Dates of Reporting Period: 05/25/15 to 07-02-15  Objective Reports of Subjective Statement: Patient feels that he is having improvements in overall QOL due to PT/HEP   Objective Measurements: see above   Goal Update: see above   Plan: see above   Reason Skilled Services are Required: to address reduced functional activity tolerance, gait impairments, general mobility and strength, [address] PT, DPT [place], [place], [address] Phone: [phone]   Fax:  [phone]

## 2015-06-29 ENCOUNTER — Encounter (HOSPITAL_COMMUNITY): Payer: Medicare Other

## 2015-07-04 ENCOUNTER — Encounter (HOSPITAL_BASED_OUTPATIENT_CLINIC_OR_DEPARTMENT_OTHER): Payer: Medicare Other | Admitting: Hematology & Oncology

## 2015-07-04 ENCOUNTER — Encounter (HOSPITAL_COMMUNITY): Payer: Medicare Other | Attending: Hematology & Oncology

## 2015-07-04 ENCOUNTER — Encounter (HOSPITAL_COMMUNITY): Payer: Self-pay | Admitting: Hematology & Oncology

## 2015-07-04 VITALS — BP 118/69 | HR 94 | Temp 97.8°F | Resp 20 | Wt 271.6 lb

## 2015-07-04 VITALS — BP 131/62 | HR 74 | Resp 20

## 2015-07-04 DIAGNOSIS — C187 Malignant neoplasm of sigmoid colon: Secondary | ICD-10-CM | POA: Diagnosis present

## 2015-07-04 DIAGNOSIS — R195 Other fecal abnormalities: Secondary | ICD-10-CM | POA: Insufficient documentation

## 2015-07-04 DIAGNOSIS — C189 Malignant neoplasm of colon, unspecified: Secondary | ICD-10-CM

## 2015-07-04 DIAGNOSIS — Z800 Family history of malignant neoplasm of digestive organs: Secondary | ICD-10-CM

## 2015-07-04 DIAGNOSIS — R634 Abnormal weight loss: Secondary | ICD-10-CM

## 2015-07-04 DIAGNOSIS — Z5111 Encounter for antineoplastic chemotherapy: Secondary | ICD-10-CM | POA: Diagnosis present

## 2015-07-04 DIAGNOSIS — F418 Other specified anxiety disorders: Secondary | ICD-10-CM | POA: Diagnosis not present

## 2015-07-04 LAB — COMPREHENSIVE METABOLIC PANEL
ALBUMIN: 2.8 g/dL — AB (ref 3.5–5.0)
ALK PHOS: 129 U/L — AB (ref 38–126)
ALT: 19 U/L (ref 17–63)
ANION GAP: 8 (ref 5–15)
AST: 39 U/L (ref 15–41)
BILIRUBIN TOTAL: 1.1 mg/dL (ref 0.3–1.2)
BUN: 9 mg/dL (ref 6–20)
CO2: 24 mmol/L (ref 22–32)
Calcium: 8.7 mg/dL — ABNORMAL LOW (ref 8.9–10.3)
Chloride: 101 mmol/L (ref 101–111)
Creatinine, Ser: 0.93 mg/dL (ref 0.61–1.24)
GFR calc Af Amer: >60 mL/min (ref >60)
GFR calc non Af Amer: >60 mL/min (ref >60)
Glucose, Bld: 164 mg/dL — ABNORMAL HIGH (ref 65–99)
POTASSIUM: 3.5 mmol/L (ref 3.5–5.1)
Sodium: 133 mmol/L — ABNORMAL LOW (ref 135–145)
Total Protein: 6.8 g/dL (ref 6.5–8.1)

## 2015-07-04 LAB — CBC WITH DIFFERENTIAL/PLATELET
Basophils Absolute: 0.1 10*3/uL (ref 0.0–0.1)
Basophils Relative: 1 % (ref 0–1)
EOS PCT: 2 % (ref 0–5)
Eosinophils Absolute: 0.2 10*3/uL (ref 0.0–0.7)
HCT: 38.1 % — ABNORMAL LOW (ref 39.0–52.0)
HEMOGLOBIN: 12.9 g/dL — AB (ref 13.0–17.0)
LYMPHS ABS: 1.9 10*3/uL (ref 0.7–4.0)
LYMPHS PCT: 19 % (ref 12–46)
MCH: 32.3 pg (ref 26.0–34.0)
MCHC: 33.9 g/dL (ref 30.0–36.0)
MCV: 95.5 fL (ref 78.0–100.0)
Monocytes Absolute: 1.1 10*3/uL — ABNORMAL HIGH (ref 0.1–1.0)
Monocytes Relative: 11 % (ref 3–12)
Neutro Abs: 6.9 10*3/uL (ref 1.7–7.7)
Neutrophils Relative %: 68 % (ref 43–77)
PLATELETS: 144 10*3/uL — AB (ref 150–400)
RBC: 3.99 MIL/uL — AB (ref 4.22–5.81)
RDW: 17 % — ABNORMAL HIGH (ref 11.5–15.5)
WBC: 10.2 10*3/uL (ref 4.0–10.5)

## 2015-07-04 MED ORDER — FLUOROURACIL CHEMO INJECTION 5 GM/100ML
2400.0000 mg/m2 | INTRAVENOUS | Status: DC
  Administered 2015-07-04: 5850 mg via INTRAVENOUS
  Filled 2015-07-04: qty 117

## 2015-07-04 MED ORDER — DEXTROSE 5 % IV SOLN
68.0000 mg/m2 | Freq: Once | INTRAVENOUS | Status: AC
  Administered 2015-07-04: 165 mg via INTRAVENOUS
  Filled 2015-07-04: qty 33

## 2015-07-04 MED ORDER — DEXTROSE 5 % IV SOLN
Freq: Once | INTRAVENOUS | Status: AC
  Administered 2015-07-04: 11:00:00 via INTRAVENOUS

## 2015-07-04 MED ORDER — SODIUM CHLORIDE 0.9 % IV SOLN
Freq: Once | INTRAVENOUS | Status: AC
  Administered 2015-07-04: 11:00:00 via INTRAVENOUS
  Filled 2015-07-04: qty 4

## 2015-07-04 MED ORDER — SODIUM CHLORIDE 0.9 % IJ SOLN: 10.0000 mL | INTRAMUSCULAR | Status: DC | PRN

## 2015-07-04 MED ORDER — FLUOROURACIL CHEMO INJECTION 2.5 GM/50ML
400.0000 mg/m2 | Freq: Once | INTRAVENOUS | Status: AC
  Administered 2015-07-04: 950 mg via INTRAVENOUS
  Filled 2015-07-04: qty 19

## 2015-07-04 MED ORDER — LEUCOVORIN CALCIUM INJECTION 350 MG
400.0000 mg/m2 | Freq: Once | INTRAMUSCULAR | Status: AC
  Administered 2015-07-04: 972 mg via INTRAVENOUS
  Filled 2015-07-04: qty 48.6

## 2015-07-04 NOTE — Progress Notes (Signed)
Peter Bogus, MD [place]  [id number]  Stage IIA adenocarcinoma of the colon  CURRENT THERAPY: Adjuvant FOLFOX   INTERVAL HISTORY: [name] 69 y.o. male returns for followup of Stage IIA adenocarcinoma of colon, S/P definitive surgery by Dr. Arnoldo Beasley on 04/03/2015, and now undergoing adjuvant FOLFOX chemotherapy.   He is here today with his wife. He expresses his mood is good, his wife agrees with this statement. His fingers and toes are all right, he is still able to button up shirts. Sometimes if he touches a cold can within 10 days of treatment it is tingly. His bowels are good.  He has some regrets about not regularly going to the doctor before he was diagnosed with cancer. His current goal is to see his granddaughter graduate from school, she is currently in kindergarten.  He has been attending STAR program sessions. At first he did not feel better after the sessions, after about 2 days of going he has seen progress. He is concerned about being scheduled for a physical therapy session the day after having his pump removed. He notes he is very fatigued on that day.  He has no other complaints. He feels he is eating well. No nausea, vomiting or diarrhea. No mouth sores.    Colon carcinoma   03/28/2015 Initial Biopsy Diagnosis 1. Colon, biopsy, left descending - INVASIVE ADENOCARCINOMA. 2. Stomach, biopsy - GASTRIC BODY AND ANTRAL-TYPE MUCOSA WITH ASSOCIATED MINIMAL CHRONIC INFLAMMATION. - NO EVIDENCE OF HELICOBACTER PYLORI, INTESTINAL METAPLASIA, DYSPLASIA OR MA   03/29/2015 Imaging CT abd/pelvis- Circumferential mucosal lesion the proximal sigmoid colon consists with colorectal carcinoma.   04/03/2015 Definitive Surgery Colon, segmental resection for tumor - INVASIVE ADENOCARCINOMA, INVADING THROUGH THE MUSCULARIS PROPRIA INTO PERICOLONIC FATTY TISSUE. - FOURTEEN LYMPH NODES, NEGATIVE FOR METASTATIC CARCINOMA (0/14). - RESECTION  MARGINS, NEGATIVE FOR ATYPIA OR MALIGNA   05/16/2015 -  Chemotherapy FOLFOX    05/31/2015 Treatment Plan Change Oxaliplatin dose reduced by 20% secondary to fatigue from cycle 1 of therapy.     Past Medical History  Diagnosis Date  . Varicose veins   . New onset atrial fibrillation 01/02/2015  . Morbid obesity 01/02/2015  . Hypertension   . Dysrhythmia     Afib- on set 12/2014  . Sleep apnea     been tested but has not received the CPAP yet  . GERD (gastroesophageal reflux disease)   . History of gout   . Cancer     colon  . Family history of colon cancer   . Family history of breast cancer in mother     has Varicose veins of lower extremities with other complications; New onset atrial fibrillation; Morbid obesity; Melena; Heme positive stool; Esophageal dysphagia; Diarrhea; Occult blood in stools; Dysphagia, pharyngoesophageal phase; Colon carcinoma; Family history of colon cancer; and Family history of breast cancer in mother on his problem list.   He believes that his sister died of heart issues, she died in her sleep.   has No Known Allergies.  Current Outpatient Prescriptions on File Prior to Visit  Medication Sig Dispense Refill  . apixaban (ELIQUIS) 5 MG TABS tablet Take 5 mg by mouth daily.    [name] dextrose 5 % SOLN 1,000 mL with fluorouracil 5 GM/100ML SOLN Inject into the vein every 14 (fourteen) days. To infuse over 46 hours. To be given in near future    . escitalopram (LEXAPRO) 10 MG tablet Take one tablet  daily for 5 days then increase to two tablets therafter 60 tablet 3  . HYDROcodone-acetaminophen (NORCO/VICODIN) 5-325 MG per tablet Take 1-2 tablets by mouth every 6 (six) hours as needed for moderate pain. 40 tablet 0  . ibuprofen (ADVIL,MOTRIN) 200 MG tablet Take 400 mg by mouth every 6 (six) hours as needed for mild pain or moderate pain (for knee pain).    [name] LEUCOVORIN CALCIUM IV Inject into the vein every 14 (fourteen) days. To be started in near future    .  lidocaine-prilocaine (EMLA) cream Apply a quarter size amount to port site 1 hour prior to chemo. Do not rub in. Cover with plastic wrap. 30 g 3  . lisinopril (PRINIVIL,ZESTRIL) 10 MG tablet Take 1 tablet (10 mg total) by mouth daily. 30 tablet 12  . omeprazole (PRILOSEC) 20 MG capsule Take 1 capsule (20 mg total) by mouth daily. 30 capsule 3  . ondansetron (ZOFRAN) 8 MG tablet Take 1 tablet (8 mg total) by mouth every 8 (eight) hours as needed. 30 tablet 3  . OXALIPLATIN IV Inject into the vein every 14 (fourteen) days. To start in near future    . potassium chloride SA (K-DUR,KLOR-CON) 20 MEQ tablet Take 1 tablet (20 mEq total) by mouth 2 (two) times daily. 14 tablet 0  . prochlorperazine (COMPAZINE) 10 MG tablet Take 1 tablet (10 mg total) by mouth every 6 (six) hours as needed for nausea or vomiting. 30 tablet 3   Current Facility-Administered Medications on File Prior to Visit  Medication Dose Route Frequency Provider Last Rate Last Dose  . fluorouracil (ADRUCIL) 5,850 mg in sodium chloride 0.9 % 133 mL chemo infusion  2,400 mg/m2 (Treatment Plan Actual) Intravenous 1 day or 1 dose [name], MD      . fluorouracil (ADRUCIL) chemo injection 950 mg  400 mg/m2 (Treatment Plan Actual) Intravenous Once [name], MD      . leucovorin 972 mg in dextrose 5 % 250 mL infusion  400 mg/m2 (Treatment Plan Actual) Intravenous Once [name], MD 149 mL/hr at 07/04/15 1127 972 mg at 07/04/15 1127  . oxaliplatin (ELOXATIN) 165 mg in dextrose 5 % 500 mL chemo infusion  68 mg/m2 (Treatment Plan Actual) Intravenous Once [name], MD 267 mL/hr at 07/04/15 1127 165 mg at 07/04/15 1127  . sodium chloride 0.9 % injection 10 mL  10 mL Intracatheter PRN [name], MD        Past Surgical History  Procedure Laterality Date  . Vein ligation and stripping      left leg  . Colonoscopy N/A 03/28/2015    Procedure: COLONOSCOPY;  Surgeon: [name], MD;  [facility];  Service: Endoscopy;  Laterality: N/A;  . Esophagogastroduodenoscopy N/A 03/28/2015    Procedure: ESOPHAGOGASTRODUODENOSCOPY (EGD);  Surgeon: [name], MD;  [facility];  Service: Endoscopy;  Laterality: N/A;  . Esophageal dilation N/A 03/28/2015    Procedure: ESOPHAGEAL DILATION;  Surgeon: [name], MD;  [facility];  Service: Endoscopy;  Laterality: N/A;  . Partial colectomy N/A 04/03/2015    Procedure: PARTIAL COLECTOMY;  Surgeon: [name], MD;  Location: [facility];  Service: General;  Laterality: N/A;  . Portacath placement N/A 05/10/2015    Procedure: INSERTION PORT-A-CATH;  Surgeon: [name], MD;  Location: [facility];  Service: General;  Laterality: N/A;  left subclavian    Denies any headaches, dizziness, double vision, fevers, chills,  night sweats, nausea, vomiting, diarrhea, constipation, chest pain, heart palpitations, shortness of breath, blood in stool, black tarry stool, urinary pain, urinary burning, urinary frequency, hematuria. Positive for weight loss. Patient feels he has a good appetite, increased activity    PHYSICAL EXAMINATION  ECOG PERFORMANCE STATUS: 1 - Symptomatic but completely ambulatory  Filed Vitals:   07/04/15 0908  BP: 118/69  Pulse: 94  Temp: 97.8 F (36.6 C)  Resp: 20    GENERAL:alert, no distress, well nourished, well developed, comfortable, cooperative, obese. Mood is excellent today. He is smiling and very talkative SKIN: skin color, texture, turgor are normal, no rashes or significant lesions HEAD: Normocephalic, No masses, lesions, tenderness or abnormalities EYES: normal, PERRLA, EOMI, Conjunctiva are pink and non-injected EARS: External ears normal OROPHARYNX:lips, buccal mucosa, and tongue normal and mucous membranes are moist  NECK: supple, thyroid normal size, non-tender, without nodularity, trachea midline LYMPH:  not examined BREAST:not examined LUNGS: clear to auscultation      Coarse  breath sounds in right lower lung, cleared with cough HEART: regular rate & rhythm ABDOMEN:abdomen soft, obese and normal bowel sounds BACK: Back symmetric, no curvature. EXTREMITIES:less then 2 second capillary refill, no joint deformities, effusion, or inflammation, no skin discoloration      Left lower extremity is larger than the right. (chronic) NEURO: alert & oriented x 3 with fluent speech, no focal motor/sensory deficits  LABORATORY DATA:  CBC    Component Value Date/Time   WBC 10.2 07/04/2015 0915   RBC 3.99* 07/04/2015 0915   HGB 12.9* 07/04/2015 0915   HCT 38.1* 07/04/2015 0915   PLT 144* 07/04/2015 0915   MCV 95.5 07/04/2015 0915   MCH 32.3 07/04/2015 0915   MCHC 33.9 07/04/2015 0915   RDW 17.0* 07/04/2015 0915   LYMPHSABS 1.9 07/04/2015 0915   MONOABS 1.1* 07/04/2015 0915   EOSABS 0.2 07/04/2015 0915   BASOSABS 0.1 07/04/2015 0915      Chemistry      Component Value Date/Time   NA 133* 07/04/2015 0915   K 3.5 07/04/2015 0915   CL 101 07/04/2015 0915   CO2 24 07/04/2015 0915   BUN 9 07/04/2015 0915   CREATININE 0.93 07/04/2015 0915      Component Value Date/Time   CALCIUM 8.7* 07/04/2015 0915   ALKPHOS 129* 07/04/2015 0915   AST 39 07/04/2015 0915   ALT 19 07/04/2015 0915   BILITOT 1.1 07/04/2015 0915      Lab Results  Component Value Date   CEA 3.0 04/27/2015    ASSESSMENT AND PLAN:  Stage II colorectal cancer Adjuvant FOLFOX Family history of colon cancer Situational depression/anxiety  He is much improved today. His activity is markedly increased. His appetite also appears to be improving. I would like to see his weight stabilize and I discussed [place] with he and his wife today. We will continue to monitor his weight closely over the next cycle or 2.   He is to continue his anti-depressant. His mood is markedly improved. He will follow-up in 2 weeks with repeat labs, physical exam and his next cycle of chemotherapy.   Discussed the benefits  of exercise during chemotherapy and encouraged him to continue attending his STAR sessions. Advised to let us know if scheduling with STAR becomes an issue.  He would like to reschedule his chemotherapy sessions to Mondays. I advised them we could acomodate this.  All questions were answered. The patient knows to call the clinic with any problems, questions or concerns. We  can certainly see the patient much sooner if necessary.  This document serves as a record of services personally performed by Ancil Linsey, MD. It was created on her behalf by Arlyce Harman, a trained medical scribe. The creation of this record is based on the scribe's personal observations and the provider's statements to them. This document has been checked and approved by the attending provider.  I have reviewed the above documentation for accuracy and completeness, and I agree with the above. This note was electronically signed. Molli Hazard, MD

## 2015-07-04 NOTE — Patient Instructions (Signed)
..  [facility] Discharge Instructions for Patients Receiving Chemotherapy  Today you received the following chemotherapy agents folfox  To help prevent nausea and vomiting after your treatment, we encourage you to take your nausea medication    If you develop nausea and vomiting, or diarrhea that is not controlled by your medication, call the clinic.  The clinic phone number is [phone]. Office hours are Monday-Friday 8:30am-5:00pm.  BELOW ARE SYMPTOMS THAT SHOULD BE REPORTED IMMEDIATELY:  *FEVER GREATER THAN 101.0 F  *CHILLS WITH OR WITHOUT FEVER  NAUSEA AND VOMITING THAT IS NOT CONTROLLED WITH YOUR NAUSEA MEDICATION  *UNUSUAL SHORTNESS OF BREATH  *UNUSUAL BRUISING OR BLEEDING  TENDERNESS IN MOUTH AND THROAT WITH OR WITHOUT PRESENCE OF ULCERS  *URINARY PROBLEMS  *BOWEL PROBLEMS  UNUSUAL RASH Items with * indicate a potential emergency and should be followed up as soon as possible. If you have an emergency after office hours please contact your primary care physician or go to the nearest emergency department.  Please call the clinic during office hours if you have any questions or concerns.   You may also contact the Patient Navigator at [phone] should you have any questions or need assistance in obtaining follow up care. _____________________________________________________________________ Have you asked about our STAR program?    STAR stands for Survivorship Training and Rehabilitation, and this is a nationally recognized cancer care program that focuses on survivorship and rehabilitation.  Cancer and cancer treatments may cause problems, such as, pain, making you feel tired and keeping you from doing the things that you need or want to do. Cancer rehabilitation can help. Our goal is to reduce these troubling effects and help you have the best quality of life possible.  You may receive a survey from a nurse that asks questions about your current  state of health.  Based on the survey results, all eligible patients will be referred to the [facility] program for an evaluation so we can better serve you! A frequently asked questions sheet is available upon request.

## 2015-07-06 ENCOUNTER — Encounter (HOSPITAL_BASED_OUTPATIENT_CLINIC_OR_DEPARTMENT_OTHER): Payer: Medicare Other

## 2015-07-06 VITALS — BP 109/74 | HR 69 | Temp 97.8°F | Resp 18

## 2015-07-06 DIAGNOSIS — Z5189 Encounter for other specified aftercare: Secondary | ICD-10-CM | POA: Diagnosis not present

## 2015-07-06 DIAGNOSIS — C189 Malignant neoplasm of colon, unspecified: Secondary | ICD-10-CM | POA: Diagnosis present

## 2015-07-06 MED ORDER — HEPARIN SOD (PORK) LOCK FLUSH 100 UNIT/ML IV SOLN
500.0000 [IU] | Freq: Once | INTRAVENOUS | Status: AC | PRN
  Administered 2015-07-06: 500 [IU]

## 2015-07-06 MED ORDER — PEGFILGRASTIM INJECTION 6 MG/0.6ML ~~LOC~~: 6.0000 mg | PREFILLED_SYRINGE | Freq: Once | SUBCUTANEOUS | Status: DC

## 2015-07-06 MED ORDER — PEGFILGRASTIM 6 MG/0.6ML ~~LOC~~ PSKT
6.0000 mg | PREFILLED_SYRINGE | Freq: Once | SUBCUTANEOUS | Status: AC
  Administered 2015-07-06: 6 mg via SUBCUTANEOUS
  Filled 2015-07-06: qty 0.6

## 2015-07-06 MED ORDER — SODIUM CHLORIDE 0.9 % IJ SOLN
10.0000 mL | INTRAMUSCULAR | Status: DC | PRN
  Administered 2015-07-06: 10 mL
  Filled 2015-07-06: qty 10

## 2015-07-06 MED ORDER — HEPARIN SOD (PORK) LOCK FLUSH 100 UNIT/ML IV SOLN
INTRAVENOUS | Status: AC
  Filled 2015-07-06: qty 5

## 2015-07-06 NOTE — Progress Notes (Signed)
[name] presented for Portacath de-access and flush.  Proper placement of portacath confirmed by CXR.  Portacath located right chest wall accessed with  H 20 needle.  Good blood return present. Portacath flushed with 82ml NS and 500U/66ml Heparin and needle removed intact.  Procedure tolerated well and without incident.  Pump removed   .Marland KitchenDarrick Penna Fincanonpresents today for neulasta OBI placement per MD orders. OBI device filled per protocol and placed on left Upper Arm. Needle/catheter placement noted prior to patient leaving. Tolerated without incident and aware of injection to be delivered in  27 hours.

## 2015-07-06 NOTE — Patient Instructions (Signed)
[place] at [facility] Discharge Instructions  RECOMMENDATIONS MADE BY THE CONSULTANT AND ANY TEST RESULTS WILL BE SENT TO YOUR REFERRING PHYSICIAN.  Pump removed and port flushed  Please follow up as scheduled Call the clinic if you have any questions or concerns  Thank you for choosing [place] at [facility] to provide your oncology and hematology care.  To afford each patient quality time with our provider, please arrive at least 15 minutes before your scheduled appointment time.    You need to re-schedule your appointment should you arrive 10 or more minutes late.  We strive to give you quality time with our providers, and arriving late affects you and other patients whose appointments are after yours.  Also, if you no show three or more times for appointments you may be dismissed from the clinic at the providers discretion.     Again, thank you for choosing [facility].  Our hope is that these requests will decrease the amount of time that you wait before being seen by our physicians.       _____________________________________________________________  Should you have questions after your visit to [facility], please contact our office at [phone] between the hours of 8:30 a.m. and 4:30 p.m.  Voicemails left after 4:30 p.m. will not be returned until the following business day.  For prescription refill requests, have your pharmacy contact our office.

## 2015-07-07 ENCOUNTER — Ambulatory Visit (HOSPITAL_COMMUNITY): Payer: Medicare Other | Attending: Hematology & Oncology | Admitting: Physical Therapy

## 2015-07-07 DIAGNOSIS — R262 Difficulty in walking, not elsewhere classified: Secondary | ICD-10-CM | POA: Insufficient documentation

## 2015-07-07 DIAGNOSIS — R6889 Other general symptoms and signs: Secondary | ICD-10-CM | POA: Diagnosis present

## 2015-07-07 NOTE — Therapy (Signed)
[facility] [place], [place], [address] Phone: [phone]   Fax:  [phone]  Physical Therapy Treatment  Patient Details  Name: [name] MRN: [id number] Date of Birth: 07-Feb-1946 Referring Provider:  [name], MD  Encounter Date: 07/07/2015      PT End of Session - 07/07/15 0925    Visit Number 5   Number of Visits 10   Date for PT Re-Evaluation 07/26/15   Authorization Type Medicare   Authorization Time Period G-codes done 4th visit    Authorization - Visit Number 5   Authorization - Number of Visits 14   PT Start Time 0845   PT Stop Time 0931   PT Time Calculation (min) 46 min   Activity Tolerance Patient tolerated treatment well   Behavior During Therapy [facility] for tasks assessed/performed      Past Medical History  Diagnosis Date  . Varicose veins   . New onset atrial fibrillation 01/02/2015  . Morbid obesity 01/02/2015  . Hypertension   . Dysrhythmia     Afib- on set 12/2014  . Sleep apnea     been tested but has not received the CPAP yet  . GERD (gastroesophageal reflux disease)   . History of gout   . Cancer     colon  . Family history of colon cancer   . Family history of breast cancer in mother     Past Surgical History  Procedure Laterality Date  . Vein ligation and stripping      left leg  . Colonoscopy N/A 03/28/2015    Procedure: COLONOSCOPY;  Surgeon: [name], MD;  [facility];  Service: Endoscopy;  Laterality: N/A;  . Esophagogastroduodenoscopy N/A 03/28/2015    Procedure: ESOPHAGOGASTRODUODENOSCOPY (EGD);  Surgeon: [name], MD;  [facility];  Service: Endoscopy;  Laterality: N/A;  . Esophageal dilation N/A 03/28/2015    Procedure: ESOPHAGEAL DILATION;  Surgeon: [name], MD;  [facility];  Service: Endoscopy;  Laterality: N/A;  . Partial colectomy N/A 04/03/2015    Procedure: PARTIAL COLECTOMY;  Surgeon: [name],  MD;  Location: [facility];  Service: General;  Laterality: N/A;  . Portacath placement N/A 05/10/2015    Procedure: INSERTION PORT-A-CATH;  Surgeon: [name], MD;  Location: [facility];  Service: General;  Laterality: N/A;  left subclavian    There were no vitals filed for this visit.  Visit Diagnosis:  Decreased functional activity tolerance  Difficulty walking      Subjective Assessment - 07/07/15 0849    Subjective Patient states that he just had chemo yesterday; feeling a little tired today but otherwise OK.    Pertinent History Patient had cancer surgery in april 2016 to removed a18inches of colon, mD beleives they removed all of colon, Patient began Chemo 05/17/15. patient had surgery 20 years ago to "strip veins" and has sicne had swelling in both legs. patint [place] shooting pool, patient states limited physical activity due to bad knee and limited energy levels noting "I can t walk across the road."  "I feel worn out to where i can sit up for a while then have to sit back down and rest.  little pain with sit to stance.    Currently in Pain? No/denies                         [facility] Adult PT Treatment/Exercise -  07/07/15 0001    Knee/Hip Exercises: Stretches   Active Hamstring Stretch 3 reps;30 seconds   Active Hamstring Stretch Limitations 12 inch box    Piriformis Stretch 2 reps;30 seconds   Piriformis Stretch Limitations seated   Gastroc Stretch 3 reps;30 seconds   Gastroc Stretch Limitations slantboard    Knee/Hip Exercises: Aerobic   Stationary Bike Nustep seat 10 level 1 x7 minutes    Knee/Hip Exercises: Standing   Heel Raises Both;1 set;15 reps   Forward Lunges Both;1 set;10 reps   Forward Lunges Limitations 6 inch box    Rocker Board Limitations x20AP and lateral, U HHA    Other Standing Knee Exercises 3D hip excursions 1x10   Other Standing Knee Exercises Sit to stand with eccentric lower 1x10    Knee/Hip Exercises: Seated   Long Arc Quad  Strengthening;Both;1 set;15 reps   Long Arc Quad Weight 2 lbs.   Other Seated Knee/Hip Exercises seated hip flexion bilateral 1 X 10   Knee/Hip Exercises: Supine   Bridges Both;1 set;10 reps   Bridges Limitations supine    Straight Leg Raises Both;1 set;10 reps   Knee/Hip Exercises: Sidelying   Clams 1x10 each side              Balance Exercises - 07/07/15 0859    Balance Exercises: Standing   Standing Eyes Closed Narrow base of support (BOS);3 reps;10 secs   Tandem Stance Eyes open;3 reps;15 secs   SLS --   Wall Bumps --  shoulder bumps 1x20           PT Education - 07/07/15 0932    Education provided No          PT Short Term Goals - 06/28/15 1332    PT SHORT TERM GOAL #1   Title Patient will be able to ambualte at 1.79m/s indicating community ambualtory status   Time 4   Period Weeks   Status On-going   PT SHORT TERM GOAL #2   Title Patient will be able to ambulate 37minutes without resting.    Baseline 6/29- patient states he is able to ambulate around 4 minutes before fatiguing    Time 4   Period Weeks   Status On-going   PT SHORT TERM GOAL #3   Title Patinet will be independent with monitoring step count   Time 4   Period Weeks   Status Achieved   PT SHORT TERM GOAL #4   Title Patient will average > 4000 steps a day   Baseline 6/29- 1500 steps on average    Time 4   Period Weeks   Status On-going           PT Long Term Goals - 06/28/15 1334    PT LONG TERM GOAL #1   Title Patient will be ebale to ambualte > 95minutes without resting   Time 8   Period Weeks   Status On-going   PT LONG TERM GOAL #2   Title Patient will ambualte > 10000 steps per day   Time 10   Period Weeks   Status On-going   PT LONG TERM GOAL #3   Title Patient will be independent with HEP and attend silver sneakers at local [employer]   Time 8   Period Weeks   Status On-going               Plan - 07/07/15 1324    Clinical Impression Statement Continued with  functional stretches, functional exercise as tolerated, and  balance activities today. Patient reports that today he is most limited by his knee pain, plans to get this joint replaced after he overcomes the cancer. Patient didi also display some fatigue throughout today's session however was able to complete all activities.    Pt will benefit from skilled therapeutic intervention in order to improve on the following deficits Abnormal gait;Difficulty walking;Decreased activity tolerance;Decreased endurance   Rehab Potential Good   PT Frequency 1x / week   PT Duration Other (comment)  6-8 more sessions    PT Treatment/Interventions Gait training;Stair training;Functional mobility training;Therapeutic exercise;Balance training;Patient/family education;Energy conservation   PT Next Visit Plan continue with activity progression through ambulation and exercise program. Add 2 strengthening exercises to HEP and 2 balance exercises if tolerable.    PT Home Exercise Plan Working seated exercises and anticipate progressing as tolerated   Consulted and Agree with Plan of Care Patient        Problem List Patient Active Problem List   Diagnosis Date Noted  . Family history of colon cancer   . Family history of breast cancer in mother   . Colon carcinoma 04/03/2015  . Occult blood in stools   . Dysphagia, pharyngoesophageal phase   . Melena 03/15/2015  . Heme positive stool 03/15/2015  . Esophageal dysphagia 03/15/2015  . Diarrhea 03/15/2015  . New onset atrial fibrillation 01/02/2015  . Morbid obesity 01/02/2015  . Varicose veins of lower extremities with other complications 63/84/5364    Deniece Ree PT, DPT [facility] [address] [place], [place], [address] Phone: [phone]   Fax:  [phone]

## 2015-07-10 ENCOUNTER — Inpatient Hospital Stay (HOSPITAL_COMMUNITY): Payer: Medicare Other

## 2015-07-12 ENCOUNTER — Encounter (HOSPITAL_COMMUNITY): Payer: Medicare Other

## 2015-07-13 ENCOUNTER — Ambulatory Visit (HOSPITAL_COMMUNITY): Payer: Medicare Other | Admitting: Physical Therapy

## 2015-07-13 DIAGNOSIS — R6889 Other general symptoms and signs: Secondary | ICD-10-CM

## 2015-07-13 DIAGNOSIS — R262 Difficulty in walking, not elsewhere classified: Secondary | ICD-10-CM

## 2015-07-13 NOTE — Therapy (Signed)
[place], [place], [address] Phone: [phone]   Fax:  [phone]  Physical Therapy Treatment  Patient Details  Name: [name] MRN: [id number] Date of Birth: 26-Jan-1946 Referring Provider:  [name], MD  Encounter Date: 07/13/2015      PT End of Session - 07/13/15 1635    Visit Number 6   Number of Visits 10   Date for PT Re-Evaluation 07/26/15   Authorization Type Medicare   Authorization Time Period G-codes done 4th visit    Authorization - Visit Number 6   Authorization - Number of Visits 14   PT Start Time 5573   PT Stop Time 1510   PT Time Calculation (min) 38 min   Activity Tolerance Patient tolerated treatment well   Behavior During Therapy [facility] for tasks assessed/performed      Past Medical History  Diagnosis Date  . Varicose veins   . New onset atrial fibrillation 01/02/2015  . Morbid obesity 01/02/2015  . Hypertension   . Dysrhythmia     Afib- on set 12/2014  . Sleep apnea     been tested but has not received the CPAP yet  . GERD (gastroesophageal reflux disease)   . History of gout   . Cancer     colon  . Family history of colon cancer   . Family history of breast cancer in mother     Past Surgical History  Procedure Laterality Date  . Vein ligation and stripping      left leg  . Colonoscopy N/A 03/28/2015    Procedure: COLONOSCOPY;  Surgeon: [name], MD;  [facility];  Service: Endoscopy;  Laterality: N/A;  . Esophagogastroduodenoscopy N/A 03/28/2015    Procedure: ESOPHAGOGASTRODUODENOSCOPY (EGD);  Surgeon: [name], MD;  [facility];  Service: Endoscopy;  Laterality: N/A;  . Esophageal dilation N/A 03/28/2015    Procedure: ESOPHAGEAL DILATION;  Surgeon: [name], MD;  [facility];  Service: Endoscopy;  Laterality: N/A;  . Partial colectomy N/A 04/03/2015    Procedure: PARTIAL COLECTOMY;  Surgeon: [name],  MD;  [facility]: [facility];  Service: General;  Laterality: N/A;  . Portacath placement N/A 05/10/2015    Procedure: INSERTION PORT-A-CATH;  Surgeon: [name], MD;  Location: [facility];  Service: General;  Laterality: N/A;  left subclavian    There were no vitals filed for this visit.  Visit Diagnosis:  Decreased functional activity tolerance  Difficulty walking      Subjective Assessment - 07/13/15 1503    Subjective Pt states he fell at home Wednesday night when he was getting up to use the bathroom.  States he became dizzy and fell.  pt only with small abraison on Rt forearm, no other injuries.   Currently in Pain? No/denies                         [facility] Adult PT Treatment/Exercise - 07/13/15 1640    Knee/Hip Exercises: Stretches   Active Hamstring Stretch 3 reps;30 seconds   Active Hamstring Stretch Limitations 12 inch box    Piriformis Stretch 2 reps;30 seconds   Piriformis Stretch Limitations seated   Gastroc Stretch 3 reps;30 seconds   Gastroc Stretch Limitations slantboard    Knee/Hip Exercises: Standing   Heel Raises Both;1 set;15 reps   Heel Raises Limitations toeraises 15 reps   Forward Lunges Both;1 set;10  reps   Forward Lunges Limitations 6 inch box    Other Standing Knee Exercises 3D hip excursions 1x10   Other Standing Knee Exercises Sit to stand with eccentric lower 1x10    Knee/Hip Exercises: Seated   Long Arc Quad Strengthening;Both;1 set;15 reps   Long Arc Quad Weight 3 lbs.   Other Seated Knee/Hip Exercises seated hip flexion bilateral 15x                  PT Short Term Goals - 06/28/15 1332    PT SHORT TERM GOAL #1   Title Patient will be able to ambualte at 1.43m/s indicating community ambualtory status   Time 4   Period Weeks   Status On-going   PT SHORT TERM GOAL #2   Title Patient will be able to ambulate 61minutes without resting.    Baseline 6/29- patient states he is able to ambulate around 4 minutes before fatiguing     Time 4   Period Weeks   Status On-going   PT SHORT TERM GOAL #3   Title Patinet will be independent with monitoring step count   Time 4   Period Weeks   Status Achieved   PT SHORT TERM GOAL #4   Title Patient will average > 4000 steps a day   Baseline 6/29- 1500 steps on average    Time 4   Period Weeks   Status On-going           PT Long Term Goals - 06/28/15 1334    PT LONG TERM GOAL #1   Title Patient will be ebale to ambualte > 33minutes without resting   Time 8   Period Weeks   Status On-going   PT LONG TERM GOAL #2   Title Patient will ambualte > 10000 steps per day   Time 10   Period Weeks   Status On-going   PT LONG TERM GOAL #3   Title Patient will be independent with HEP and attend silver sneakers at local [employer]   Time 8   Period Weeks   Status On-going               Plan - 07/13/15 1636    Clinical Impression Statement Pt with diffiuculty completing session today, ending early due to fatigue.  Wife states chemo has "wiped him out this week".  Pt required frequent short seated rest breaks during session today.  Noted difficulty completing lunges in correct form .   Rehab Potential Good   PT Duration --  6-8 more sessions    PT Next Visit Plan continue with activity progression through ambulation and exercise program. Add 2 strengthening exercises to HEP and 2 balance exercises if tolerable.    PT Home Exercise Plan Working seated exercises and anticipate progressing as tolerated   Consulted and Agree with Plan of Care Patient        Problem List Patient Active Problem List   Diagnosis Date Noted  . Family history of colon cancer   . Family history of breast cancer in mother   . Colon carcinoma 04/03/2015  . Occult blood in stools   . Dysphagia, pharyngoesophageal phase   . Melena 03/15/2015  . Heme positive stool 03/15/2015  . Esophageal dysphagia 03/15/2015  . Diarrhea 03/15/2015  . New onset atrial fibrillation 01/02/2015  . Morbid  obesity 01/02/2015  . Varicose veins of lower extremities with other complications 62/95/2841    Teena Irani, PTA/CLT [phone]  07/13/2015, 4:40 PM  [place], [place], [address] Phone: [phone]   Fax:  [phone]

## 2015-07-17 ENCOUNTER — Encounter (HOSPITAL_COMMUNITY): Payer: Self-pay | Admitting: Oncology

## 2015-07-17 ENCOUNTER — Encounter (HOSPITAL_BASED_OUTPATIENT_CLINIC_OR_DEPARTMENT_OTHER): Payer: Medicare Other | Admitting: Oncology

## 2015-07-17 ENCOUNTER — Encounter (HOSPITAL_BASED_OUTPATIENT_CLINIC_OR_DEPARTMENT_OTHER): Payer: Medicare Other

## 2015-07-17 VITALS — BP 125/66 | HR 103 | Resp 18 | Wt 264.2 lb

## 2015-07-17 DIAGNOSIS — C189 Malignant neoplasm of colon, unspecified: Secondary | ICD-10-CM

## 2015-07-17 DIAGNOSIS — F4321 Adjustment disorder with depressed mood: Secondary | ICD-10-CM

## 2015-07-17 DIAGNOSIS — C186 Malignant neoplasm of descending colon: Secondary | ICD-10-CM

## 2015-07-17 DIAGNOSIS — R634 Abnormal weight loss: Secondary | ICD-10-CM | POA: Diagnosis not present

## 2015-07-17 DIAGNOSIS — R5383 Other fatigue: Secondary | ICD-10-CM | POA: Diagnosis not present

## 2015-07-17 DIAGNOSIS — C187 Malignant neoplasm of sigmoid colon: Secondary | ICD-10-CM | POA: Diagnosis not present

## 2015-07-17 LAB — CBC WITH DIFFERENTIAL/PLATELET
BASOS ABS: 0.1 10*3/uL (ref 0.0–0.1)
Basophils Relative: 1 % (ref 0–1)
EOS PCT: 1 % (ref 0–5)
Eosinophils Absolute: 0.1 10*3/uL (ref 0.0–0.7)
HCT: 36.8 % — ABNORMAL LOW (ref 39.0–52.0)
Hemoglobin: 12.7 g/dL — ABNORMAL LOW (ref 13.0–17.0)
Lymphocytes Relative: 22 % (ref 12–46)
Lymphs Abs: 1.4 10*3/uL (ref 0.7–4.0)
MCH: 33 pg (ref 26.0–34.0)
MCHC: 34.5 g/dL (ref 30.0–36.0)
MCV: 95.6 fL (ref 78.0–100.0)
MONO ABS: 0.9 10*3/uL (ref 0.1–1.0)
Monocytes Relative: 14 % — ABNORMAL HIGH (ref 3–12)
NEUTROS PCT: 63 % (ref 43–77)
Neutro Abs: 4 10*3/uL (ref 1.7–7.7)
Platelets: 111 10*3/uL — ABNORMAL LOW (ref 150–400)
RBC: 3.85 MIL/uL — ABNORMAL LOW (ref 4.22–5.81)
RDW: 17.7 % — ABNORMAL HIGH (ref 11.5–15.5)
WBC: 6.3 10*3/uL (ref 4.0–10.5)

## 2015-07-17 LAB — COMPREHENSIVE METABOLIC PANEL
ALK PHOS: 129 U/L — AB (ref 38–126)
ALT: 20 U/L (ref 17–63)
AST: 37 U/L (ref 15–41)
Albumin: 2.9 g/dL — ABNORMAL LOW (ref 3.5–5.0)
Anion gap: 7 (ref 5–15)
BUN: 11 mg/dL (ref 6–20)
CALCIUM: 8.6 mg/dL — AB (ref 8.9–10.3)
CHLORIDE: 105 mmol/L (ref 101–111)
CO2: 25 mmol/L (ref 22–32)
CREATININE: 0.89 mg/dL (ref 0.61–1.24)
GFR calc Af Amer: >60 mL/min (ref >60)
GFR calc non Af Amer: >60 mL/min (ref >60)
GLUCOSE: 165 mg/dL — AB (ref 65–99)
POTASSIUM: 3.7 mmol/L (ref 3.5–5.1)
SODIUM: 137 mmol/L (ref 135–145)
TOTAL PROTEIN: 6.6 g/dL (ref 6.5–8.1)
Total Bilirubin: 1.3 mg/dL — ABNORMAL HIGH (ref 0.3–1.2)

## 2015-07-17 MED ORDER — DEXTROSE 5 % IV SOLN: Freq: Once | INTRAVENOUS | Status: DC

## 2015-07-17 MED ORDER — ESCITALOPRAM OXALATE 20 MG PO TABS: 20.0000 mg | ORAL_TABLET | Freq: Every day | ORAL | Status: DC

## 2015-07-17 MED ORDER — HEPARIN SOD (PORK) LOCK FLUSH 100 UNIT/ML IV SOLN
INTRAVENOUS | Status: AC
  Filled 2015-07-17: qty 5

## 2015-07-17 MED ORDER — SODIUM CHLORIDE 0.9 % IV SOLN
Freq: Once | INTRAVENOUS | Status: AC
  Administered 2015-07-17: 11:00:00 via INTRAVENOUS

## 2015-07-17 MED ORDER — SODIUM CHLORIDE 0.9 % IJ SOLN
10.0000 mL | INTRAMUSCULAR | Status: DC | PRN
  Administered 2015-07-17: 10 mL
  Filled 2015-07-17: qty 10

## 2015-07-17 MED ORDER — HEPARIN SOD (PORK) LOCK FLUSH 100 UNIT/ML IV SOLN
500.0000 [IU] | Freq: Once | INTRAVENOUS | Status: AC
  Administered 2015-07-17: 500 [IU] via INTRAVENOUS

## 2015-07-17 NOTE — Assessment & Plan Note (Addendum)
Stage IIA adenocarcinoma of colon, S/P definitive surgery by Dr. Arnoldo Morale on 04/03/2015, and now undergoing adjuvant FOLFOX chemotherapy for high risk factors.  Treatment will provide an 8% absolute survival advantage and the patient is interested in pursuing systemic chemotherapy.  Oxaliplatin dose is reduced by 20% beginning on cycle #2.  Pre-chemo labs today: CBC diff, CMET   New Rx for Lexapro 20 mg daily.  This is printed and given to the patient.   I have encouraged increased fluids and based upon his labs today, I may give him additional IV fluids.   He is down 7 lbs compared to 7/5.  I have made nutritional recommendations including ensure (so similar) supplementation of meals.   Platelet count is trending downward.  Given his fatigue, will give IV fluids and defer treatment x 1 week.  Return in 1 weeks for follow-up, pre-chemotherapy labs, and treatment.

## 2015-07-17 NOTE — Patient Instructions (Signed)
[place] at [facility] Discharge Instructions  RECOMMENDATIONS MADE BY THE CONSULTANT AND ANY TEST RESULTS WILL BE SENT TO YOUR REFERRING PHYSICIAN.  Exam completed by Kirby Crigler today Hold chemotherapy today for 1 week Follow up as scheduled Please call the clinic if you have any questions or concerns   Thank you for choosing [place] at [facility] to provide your oncology and hematology care.  To afford each patient quality time with our provider, please arrive at least 15 minutes before your scheduled appointment time.    You need to re-schedule your appointment should you arrive 10 or more minutes late.  We strive to give you quality time with our providers, and arriving late affects you and other patients whose appointments are after yours.  Also, if you no show three or more times for appointments you may be dismissed from the clinic at the providers discretion.     Again, thank you for choosing [facility].  Our hope is that these requests will decrease the amount of time that you wait before being seen by our physicians.       _____________________________________________________________  Should you have questions after your visit to [facility], please contact our office at [phone] between the hours of 8:30 a.m. and 4:30 p.m.  Voicemails left after 4:30 p.m. will not be returned until the following business day.  For prescription refill requests, have your pharmacy contact our office.

## 2015-07-17 NOTE — Progress Notes (Signed)
Patient and provider in agreement to hold treatment this week. Receiving 500 cc saline today. Return next week.

## 2015-07-17 NOTE — Progress Notes (Signed)
Peter Bogus, MD [place] [address]  Colon carcinoma - Plan: CBC with Differential, Comprehensive metabolic panel, escitalopram (LEXAPRO) 20 MG tablet, DISCONTINUED: dextrose 5 % solution, DISCONTINUED: sodium chloride 0.9 % injection 10 mL  CURRENT THERAPY: Beginning FOLFOX today, 05/16/2015.  INTERVAL HISTORY: [name] 68 y.o. male returns for followup of Stage IIA adenocarcinoma of colon, S/P definitive surgery by Dr. Arnoldo Morale on 04/03/2015, and now undergoing adjuvant FOLFOX chemotherapy.    Colon carcinoma   03/28/2015 Initial Biopsy Diagnosis 1. Colon, biopsy, left descending - INVASIVE ADENOCARCINOMA. 2. Stomach, biopsy - GASTRIC BODY AND ANTRAL-TYPE MUCOSA WITH ASSOCIATED MINIMAL CHRONIC INFLAMMATION. - NO EVIDENCE OF HELICOBACTER PYLORI, INTESTINAL METAPLASIA, DYSPLASIA OR MA   03/29/2015 Imaging CT abd/pelvis- Circumferential mucosal lesion the proximal sigmoid colon consists with colorectal carcinoma.   04/03/2015 Definitive Surgery Colon, segmental resection for tumor - INVASIVE ADENOCARCINOMA, INVADING THROUGH THE MUSCULARIS PROPRIA INTO PERICOLONIC FATTY TISSUE. - FOURTEEN LYMPH NODES, NEGATIVE FOR METASTATIC CARCINOMA (0/14). - RESECTION MARGINS, NEGATIVE FOR ATYPIA OR MALIGNA   05/16/2015 -  Chemotherapy FOLFOX    05/31/2015 Treatment Plan Change Oxaliplatin dose reduced by 20% secondary to fatigue from cycle 1 of therapy.   I personally reviewed and went over laboratory results with the patient.  The results are noted within this dictation.  He is tolerating well except for some fatigue.  He notes that he does not feel better today regarding his fatigue that he left last week.  "I feel worser."  He notes a dizzy-spell at home resulting in a fall.  He denies any injuries.  He describes orthostatic hypotension.  He does not drink enough fluids at home and I have encouraged increased fluids.  He has not increased his Lexapro as  prescribed to 20 mg daily.  A ne Rx is printed for him.   His weight is down 7 lbs since 7/5.  He reports meats taste metallic and this is typical from chemotherapy.  Past Medical History  Diagnosis Date  . Varicose veins   . New onset atrial fibrillation 01/02/2015  . Morbid obesity 01/02/2015  . Hypertension   . Dysrhythmia     Afib- on set 12/2014  . Sleep apnea     been tested but has not received the CPAP yet  . GERD (gastroesophageal reflux disease)   . History of gout   . Cancer     colon  . Family history of colon cancer   . Family history of breast cancer in mother     has Varicose veins of lower extremities with other complications; New onset atrial fibrillation; Morbid obesity; Melena; Heme positive stool; Esophageal dysphagia; Diarrhea; Occult blood in stools; Dysphagia, pharyngoesophageal phase; Colon carcinoma; Family history of colon cancer; and Family history of breast cancer in mother on his problem list.     has No Known Allergies.  Current Outpatient Prescriptions on File Prior to Visit  Medication Sig Dispense Refill  . apixaban (ELIQUIS) 5 MG TABS tablet Take 5 mg by mouth daily.    [name] dextrose 5 % SOLN 1,000 mL with fluorouracil 5 GM/100ML SOLN Inject into the vein every 14 (fourteen) days. To infuse over 46 hours. To be given in near future    . LEUCOVORIN CALCIUM IV Inject into the vein every 14 (fourteen) days. To be started in near future    . lidocaine-prilocaine (EMLA) cream Apply a quarter size amount to port site 1 hour prior  to chemo. Do not rub in. Cover with plastic wrap. 30 g 3  . lisinopril (PRINIVIL,ZESTRIL) 10 MG tablet Take 1 tablet (10 mg total) by mouth daily. 30 tablet 12  . omeprazole (PRILOSEC) 20 MG capsule Take 1 capsule (20 mg total) by mouth daily. 30 capsule 3  . OXALIPLATIN IV Inject into the vein every 14 (fourteen) days. To start in near future    . HYDROcodone-acetaminophen (NORCO/VICODIN) 5-325 MG per tablet Take 1-2 tablets by mouth  every 6 (six) hours as needed for moderate pain. (Patient not taking: Reported on 07/17/2015) 40 tablet 0  . ibuprofen (ADVIL,MOTRIN) 200 MG tablet Take 400 mg by mouth every 6 (six) hours as needed for mild pain or moderate pain (for knee pain).    . ondansetron (ZOFRAN) 8 MG tablet Take 1 tablet (8 mg total) by mouth every 8 (eight) hours as needed. (Patient not taking: Reported on 07/17/2015) 30 tablet 3  . potassium chloride SA (K-DUR,KLOR-CON) 20 MEQ tablet Take 1 tablet (20 mEq total) by mouth 2 (two) times daily. (Patient not taking: Reported on 07/17/2015) 14 tablet 0  . prochlorperazine (COMPAZINE) 10 MG tablet Take 1 tablet (10 mg total) by mouth every 6 (six) hours as needed for nausea or vomiting. (Patient not taking: Reported on 07/17/2015) 30 tablet 3   No current facility-administered medications on file prior to visit.    Past Surgical History  Procedure Laterality Date  . Vein ligation and stripping      left leg  . Colonoscopy N/A 03/28/2015    Procedure: COLONOSCOPY;  Surgeon: [name], MD;  [facility];  Service: Endoscopy;  Laterality: N/A;  . Esophagogastroduodenoscopy N/A 03/28/2015    Procedure: ESOPHAGOGASTRODUODENOSCOPY (EGD);  Surgeon: [name], MD;  [facility];  Service: Endoscopy;  Laterality: N/A;  . Esophageal dilation N/A 03/28/2015    Procedure: ESOPHAGEAL DILATION;  Surgeon: [name], MD;  [facility];  Service: Endoscopy;  Laterality: N/A;  . Partial colectomy N/A 04/03/2015    Procedure: PARTIAL COLECTOMY;  Surgeon: [name], MD;  Location: [facility];  Service: General;  Laterality: N/A;  . Portacath placement N/A 05/10/2015    Procedure: INSERTION PORT-A-CATH;  Surgeon: [name], MD;  Location: [facility];  Service: General;  Laterality: N/A;  left subclavian    Denies any headaches, double vision, fevers, chills, night sweats, nausea, vomiting, diarrhea, constipation, chest pain, heart palpitations,  shortness of breath, blood in stool, black tarry stool, urinary pain, urinary burning, urinary frequency, hematuria.   PHYSICAL EXAMINATION  ECOG PERFORMANCE STATUS: 1 - Symptomatic but completely ambulatory  Filed Vitals:   07/17/15 0900  BP: 125/66  Pulse: 103  Resp: 18    GENERAL:alert, no distress, well nourished, well developed, comfortable, cooperative, obese and smiling, accompanied by [name]. SKIN: skin color, texture, turgor are normal, no rashes or significant lesions HEAD: Normocephalic, No masses, lesions, tenderness or abnormalities EYES: normal, PERRLA, EOMI, Conjunctiva are pink and non-injected EARS: External ears normal OROPHARYNX:lips, buccal mucosa, and tongue normal and mucous membranes are moist  NECK: supple, thyroid normal size, non-tender, without nodularity, trachea midline, thick LYMPH:  No adenopathy noted BREAST:Gynecomastia is noted LUNGS: clear to auscultation  HEART: regular rate & rhythm ABDOMEN:abdomen soft, obese and normal bowel sounds BACK: Back symmetric, no curvature. EXTREMITIES:less then 2 second capillary refill, no joint deformities, effusion, or inflammation, no skin discoloration  NEURO: alert & oriented x 3 with fluent speech, no focal  motor/sensory deficits   LABORATORY DATA: CBC    Component Value Date/Time   WBC 6.3 07/17/2015 0930   RBC 3.85* 07/17/2015 0930   HGB 12.7* 07/17/2015 0930   HCT 36.8* 07/17/2015 0930   PLT 111* 07/17/2015 0930   MCV 95.6 07/17/2015 0930   MCH 33.0 07/17/2015 0930   MCHC 34.5 07/17/2015 0930   RDW 17.7* 07/17/2015 0930   LYMPHSABS 1.4 07/17/2015 0930   MONOABS 0.9 07/17/2015 0930   EOSABS 0.1 07/17/2015 0930   BASOSABS 0.1 07/17/2015 0930      Chemistry      Component Value Date/Time   NA 137 07/17/2015 0930   K 3.7 07/17/2015 0930   CL 105 07/17/2015 0930   CO2 25 07/17/2015 0930   BUN 11 07/17/2015 0930   CREATININE 0.89 07/17/2015 0930      Component Value Date/Time   CALCIUM  8.6* 07/17/2015 0930   ALKPHOS 129* 07/17/2015 0930   AST 37 07/17/2015 0930   ALT 20 07/17/2015 0930   BILITOT 1.3* 07/17/2015 0930      Lab Results  Component Value Date   CEA 3.0 04/27/2015     RADIOGRAPHIC STUDIES:  No results found.    ASSESSMENT AND PLAN:  Colon carcinoma Stage IIA adenocarcinoma of colon, S/P definitive surgery by Dr. Arnoldo Morale on 04/03/2015, and now undergoing adjuvant FOLFOX chemotherapy for high risk factors.  Treatment will provide an 8% absolute survival advantage and the patient is interested in pursuing systemic chemotherapy.  Oxaliplatin dose is reduced by 20% beginning on cycle #2.  Pre-chemo labs today: CBC diff, CMET   New Rx for Lexapro 20 mg daily.  This is printed and given to the patient.   I have encouraged increased fluids and based upon his labs today, I may give him additional IV fluids.   He is down 7 lbs compared to 7/5.  I have made nutritional recommendations including ensure (so similar) supplementation of meals.   Platelet count is trending downward.  Given his fatigue, will give IV fluids and defer treatment x 1 week.  Return in 1 weeks for follow-up, pre-chemotherapy labs, and treatment.    THERAPY PLAN:  Continue treatment as planned.  All questions were answered. The patient knows to call the clinic with any problems, questions or concerns. We can certainly see the patient much sooner if necessary.  Patient and plan discussed with Dr. Ancil Linsey and she is in agreement with the aforementioned.   This note is electronically signed by: Robynn Pane 07/17/2015 10:49 AM

## 2015-07-19 ENCOUNTER — Encounter (HOSPITAL_COMMUNITY): Payer: Medicare Other

## 2015-07-19 ENCOUNTER — Ambulatory Visit (HOSPITAL_COMMUNITY): Payer: Medicare Other | Admitting: Physical Therapy

## 2015-07-19 DIAGNOSIS — R262 Difficulty in walking, not elsewhere classified: Secondary | ICD-10-CM

## 2015-07-19 DIAGNOSIS — R6889 Other general symptoms and signs: Secondary | ICD-10-CM | POA: Diagnosis not present

## 2015-07-19 NOTE — Therapy (Signed)
[place], [place], [address] Phone: [phone]   Fax:  [phone]  Physical Therapy Treatment  Patient Details  Name: [name] MRN: [id number] Date of Birth: March 17, 1946 Referring Provider:  [name], MD  Encounter Date: 07/19/2015      PT End of Session - 07/19/15 1418    Visit Number 7   Number of Visits 10   Date for PT Re-Evaluation 07/26/15   Authorization Type Medicare   Authorization - Visit Number 7   Authorization - Number of Visits 14   PT Start Time 1350   PT Stop Time 1430   PT Time Calculation (min) 40 min   Equipment Utilized During Treatment Gait belt   Activity Tolerance Patient limited by fatigue      Past Medical History  Diagnosis Date  . Varicose veins   . New onset atrial fibrillation 01/02/2015  . Morbid obesity 01/02/2015  . Hypertension   . Dysrhythmia     Afib- on set 12/2014  . Sleep apnea     been tested but has not received the CPAP yet  . GERD (gastroesophageal reflux disease)   . History of gout   . Cancer     colon  . Family history of colon cancer   . Family history of breast cancer in mother     Past Surgical History  Procedure Laterality Date  . Vein ligation and stripping      left leg  . Colonoscopy N/A 03/28/2015    Procedure: COLONOSCOPY;  Surgeon: [name], MD;  [facility];  Service: Endoscopy;  Laterality: N/A;  . Esophagogastroduodenoscopy N/A 03/28/2015    Procedure: ESOPHAGOGASTRODUODENOSCOPY (EGD);  Surgeon: [name], MD;  [facility];  Service: Endoscopy;  Laterality: N/A;  . Esophageal dilation N/A 03/28/2015    Procedure: ESOPHAGEAL DILATION;  Surgeon: [name], MD;  [facility];  Service: Endoscopy;  Laterality: N/A;  . Partial colectomy N/A 04/03/2015    Procedure: PARTIAL COLECTOMY;  Surgeon: [name], MD;  [facility];  Service: General;  Laterality: N/A;  .  Portacath placement N/A 05/10/2015    Procedure: INSERTION PORT-A-CATH;  Surgeon: [name], MD;  Location: [facility];  Service: General;  Laterality: N/A;  left subclavian    There were no vitals filed for this visit.  Visit Diagnosis:  Decreased functional activity tolerance  Difficulty walking      Subjective Assessment - 07/19/15 1352    Subjective Pt states he has been sick for 2 weeks now and has not been able to increase his steps.   Currently in Pain? No/denies                         [facility] Adult PT Treatment/Exercise - 07/19/15 0001    Exercises   Exercises Lumbar;Knee/Hip   Lumbar Exercises: Machines for Strengthening   Cybex Knee Extension 4 PL x 10   Cybex Knee Flexion 4PL x 10    Leg Press 5 PL x 10   Lumbar Exercises: Standing   Heel Raises 10 reps   Functional Squats 10 reps   Scapular Retraction Both;10 reps;Theraband   Theraband Level (Scapular Retraction) Level 3 (Green)   Row Both;10 reps;Theraband   Theraband Level (Row) Level 3 (Green)   Shoulder Extension Both;10 reps;Theraband   Theraband Level (Shoulder Extension) Level 3 (Green)   Other Standing  Lumbar Exercises hip abduction3 # x 10 B                   PT Short Term Goals - 06/28/15 1332    PT SHORT TERM GOAL #1   Title Patient will be able to ambualte at 1.65m/s indicating community ambualtory status   Time 4   Period Weeks   Status On-going   PT SHORT TERM GOAL #2   Title Patient will be able to ambulate 65minutes without resting.    Baseline 6/29- patient states he is able to ambulate around 4 minutes before fatiguing    Time 4   Period Weeks   Status On-going   PT SHORT TERM GOAL #3   Title Patinet will be independent with monitoring step count   Time 4   Period Weeks   Status Achieved   PT SHORT TERM GOAL #4   Title Patient will average > 4000 steps a day   Baseline 6/29- 1500 steps on average    Time 4   Period Weeks   Status On-going            PT Long Term Goals - 06/28/15 1334    PT LONG TERM GOAL #1   Title Patient will be ebale to ambualte > 4minutes without resting   Time 8   Period Weeks   Status On-going   PT LONG TERM GOAL #2   Title Patient will ambualte > 10000 steps per day   Time 10   Period Weeks   Status On-going   PT LONG TERM GOAL #3   Title Patient will be independent with HEP and attend silver sneakers at local [employer]   Time 8   Period Weeks   Status On-going               Plan - 07/19/15 1419    Clinical Impression Statement Pt needed to take multiple breaks throughout session today.  Pt treatment focused on strengthening.  Pt SOB during session O2 taken and was at 96 but HR 100 proceeded with treatment with keeping an eye on pt HR.  HR decreased to 92.   PT Next Visit Plan begin SLS next treatment.         Problem List Patient Active Problem List   Diagnosis Date Noted  . Family history of colon cancer   . Family history of breast cancer in mother   . Colon carcinoma 04/03/2015  . Occult blood in stools   . Dysphagia, pharyngoesophageal phase   . Melena 03/15/2015  . Heme positive stool 03/15/2015  . Esophageal dysphagia 03/15/2015  . Diarrhea 03/15/2015  . New onset atrial fibrillation 01/02/2015  . Morbid obesity 01/02/2015  . Varicose veins of lower extremities with other complications 29/47/6546   Rayetta Humphrey, PT CLT [phone] 07/19/2015, 2:28 PM  [place], [place], [address] Phone: [phone]   Fax:  [phone]

## 2015-07-21 ENCOUNTER — Encounter: Payer: Self-pay | Admitting: Dietician

## 2015-07-21 NOTE — Progress Notes (Signed)
Following up with patient in light of further wt loss and poor oral intake  Contacted Pt by Phone   Wt Readings from Last 10 Encounters:  07/17/15 264 lb 3.2 oz (119.84 kg)  07/04/15 271 lb 9.6 oz (123.197 kg)  06/20/15 285 lb 6.4 oz (129.457 kg)  06/13/15 275 lb 8 oz (124.966 kg)  05/31/15 280 lb 4.8 oz (127.143 kg)  05/25/15 278 lb (126.1 kg)  05/16/15 285 lb (129.275 kg)  05/05/15 284 lb (128.822 kg)  04/27/15 281 lb 3.2 oz (127.551 kg)  04/03/15 308 lb (139.708 kg)   Patient weight has decreased by another 11 lbs since I last spoke with him on 6/17.   Note: His most recent chemo treatment was deferred due to poor hydration.   Patient reports oral intake as poor and is suffering from symptoms including a very poor appetite.  Pt reports that he cant find any foods that he is hungry for: "I have tried everything". He states that no matter what the food is he will lose his appetite after only a few bites. He does supplement his diet with a carnation instant breakfast each day. He reports that he doesn't like Ensure/Boost  We also discussed staying adequately hydrated. He stated that he was told he drank too much soda. I explained that while soda can help him maintain weight, high sugar beverages will actually cause him to become more dehydrated. I recommended that he either drink water or a low sugar sports drink (G2) or even an ORS. He has significant cold sensitivity and says that he does not find warm water appealing at all. I asked him to try water flavor enhancers like crystal light or Mio. He says his wife uses these.   We discussed possible strategies to increase intake such as keeping a 2 liter bottle of water with him all day with the goal to finish by the end of the day. Also asked him to recruit family/friends to cue him to drink.   Mailed handouts titled "Dehydration" and "Making the Most of Each Bite"   Burtis Junes RD, LDN Nutrition Pager: [phone] 07/21/2015 2:46  PM

## 2015-07-24 ENCOUNTER — Encounter (HOSPITAL_BASED_OUTPATIENT_CLINIC_OR_DEPARTMENT_OTHER): Payer: Medicare Other | Admitting: Hematology & Oncology

## 2015-07-24 ENCOUNTER — Encounter (HOSPITAL_COMMUNITY): Payer: Self-pay | Admitting: Hematology & Oncology

## 2015-07-24 ENCOUNTER — Encounter (HOSPITAL_BASED_OUTPATIENT_CLINIC_OR_DEPARTMENT_OTHER): Payer: Medicare Other

## 2015-07-24 ENCOUNTER — Inpatient Hospital Stay (HOSPITAL_COMMUNITY): Payer: Medicare Other

## 2015-07-24 ENCOUNTER — Ambulatory Visit (HOSPITAL_COMMUNITY): Payer: Medicare Other | Admitting: Oncology

## 2015-07-24 ENCOUNTER — Other Ambulatory Visit (HOSPITAL_COMMUNITY): Payer: Self-pay | Admitting: Respiratory Therapy

## 2015-07-24 VITALS — BP 113/64 | HR 75 | Temp 98.0°F | Resp 18

## 2015-07-24 VITALS — BP 120/67 | HR 92 | Temp 97.6°F | Resp 20 | Wt 270.4 lb

## 2015-07-24 DIAGNOSIS — Z5111 Encounter for antineoplastic chemotherapy: Secondary | ICD-10-CM

## 2015-07-24 DIAGNOSIS — L989 Disorder of the skin and subcutaneous tissue, unspecified: Secondary | ICD-10-CM

## 2015-07-24 DIAGNOSIS — G4730 Sleep apnea, unspecified: Secondary | ICD-10-CM

## 2015-07-24 DIAGNOSIS — F4321 Adjustment disorder with depressed mood: Secondary | ICD-10-CM | POA: Diagnosis not present

## 2015-07-24 DIAGNOSIS — C186 Malignant neoplasm of descending colon: Secondary | ICD-10-CM | POA: Diagnosis present

## 2015-07-24 DIAGNOSIS — R630 Anorexia: Secondary | ICD-10-CM

## 2015-07-24 DIAGNOSIS — R530 Neoplastic (malignant) related fatigue: Secondary | ICD-10-CM | POA: Diagnosis not present

## 2015-07-24 DIAGNOSIS — C187 Malignant neoplasm of sigmoid colon: Secondary | ICD-10-CM | POA: Diagnosis not present

## 2015-07-24 DIAGNOSIS — C189 Malignant neoplasm of colon, unspecified: Secondary | ICD-10-CM

## 2015-07-24 DIAGNOSIS — Z800 Family history of malignant neoplasm of digestive organs: Secondary | ICD-10-CM

## 2015-07-24 DIAGNOSIS — R5383 Other fatigue: Secondary | ICD-10-CM

## 2015-07-24 LAB — CBC WITH DIFFERENTIAL/PLATELET
BASOS PCT: 0 % (ref 0–1)
Basophils Absolute: 0 10*3/uL (ref 0.0–0.1)
EOS ABS: 0.1 10*3/uL (ref 0.0–0.7)
EOS PCT: 1 % (ref 0–5)
HCT: 35.8 % — ABNORMAL LOW (ref 39.0–52.0)
Hemoglobin: 12.2 g/dL — ABNORMAL LOW (ref 13.0–17.0)
Lymphocytes Relative: 22 % (ref 12–46)
Lymphs Abs: 1.6 10*3/uL (ref 0.7–4.0)
MCH: 32.9 pg (ref 26.0–34.0)
MCHC: 34.1 g/dL (ref 30.0–36.0)
MCV: 96.5 fL (ref 78.0–100.0)
MONO ABS: 0.8 10*3/uL (ref 0.1–1.0)
MONOS PCT: 11 % (ref 3–12)
NEUTROS ABS: 4.6 10*3/uL (ref 1.7–7.7)
NEUTROS PCT: 65 % (ref 43–77)
Platelets: 108 10*3/uL — ABNORMAL LOW (ref 150–400)
RBC: 3.71 MIL/uL — ABNORMAL LOW (ref 4.22–5.81)
RDW: 18.1 % — ABNORMAL HIGH (ref 11.5–15.5)
WBC: 7 10*3/uL (ref 4.0–10.5)

## 2015-07-24 LAB — COMPREHENSIVE METABOLIC PANEL
ALT: 24 U/L (ref 17–63)
ANION GAP: 6 (ref 5–15)
AST: 43 U/L — AB (ref 15–41)
Albumin: 2.7 g/dL — ABNORMAL LOW (ref 3.5–5.0)
Alkaline Phosphatase: 117 U/L (ref 38–126)
BILIRUBIN TOTAL: 1.6 mg/dL — AB (ref 0.3–1.2)
BUN: 9 mg/dL (ref 6–20)
CALCIUM: 8.6 mg/dL — AB (ref 8.9–10.3)
CHLORIDE: 107 mmol/L (ref 101–111)
CO2: 24 mmol/L (ref 22–32)
Creatinine, Ser: 0.83 mg/dL (ref 0.61–1.24)
GFR calc Af Amer: >60 mL/min (ref >60)
GFR calc non Af Amer: >60 mL/min (ref >60)
Glucose, Bld: 157 mg/dL — ABNORMAL HIGH (ref 65–99)
Potassium: 3.3 mmol/L — ABNORMAL LOW (ref 3.5–5.1)
Sodium: 137 mmol/L (ref 135–145)
Total Protein: 6.2 g/dL — ABNORMAL LOW (ref 6.5–8.1)

## 2015-07-24 MED ORDER — HEPARIN SOD (PORK) LOCK FLUSH 100 UNIT/ML IV SOLN: 500.0000 [IU] | Freq: Once | INTRAVENOUS | Status: DC | PRN

## 2015-07-24 MED ORDER — LEUCOVORIN CALCIUM INJECTION 350 MG
400.0000 mg/m2 | Freq: Once | INTRAVENOUS | Status: AC
  Administered 2015-07-24: 972 mg via INTRAVENOUS
  Filled 2015-07-24: qty 48.6

## 2015-07-24 MED ORDER — DEXTROSE 5 % IV SOLN
Freq: Once | INTRAVENOUS | Status: AC
  Administered 2015-07-24: 10:00:00 via INTRAVENOUS

## 2015-07-24 MED ORDER — SODIUM CHLORIDE 0.9 % IV SOLN
Freq: Once | INTRAVENOUS | Status: AC
  Administered 2015-07-24: 10:00:00 via INTRAVENOUS
  Filled 2015-07-24: qty 4

## 2015-07-24 MED ORDER — SODIUM CHLORIDE 0.9 % IV SOLN
2400.0000 mg/m2 | INTRAVENOUS | Status: DC
  Administered 2015-07-24: 5850 mg via INTRAVENOUS
  Filled 2015-07-24: qty 117

## 2015-07-24 MED ORDER — OXALIPLATIN CHEMO INJECTION 100 MG/20ML
68.0000 mg/m2 | Freq: Once | INTRAVENOUS | Status: AC
  Administered 2015-07-24: 165 mg via INTRAVENOUS
  Filled 2015-07-24: qty 20

## 2015-07-24 MED ORDER — SODIUM CHLORIDE 0.9 % IJ SOLN
10.0000 mL | INTRAMUSCULAR | Status: DC | PRN
Start: 2015-07-24 — End: 2015-07-24

## 2015-07-24 NOTE — Progress Notes (Signed)
Peter Bogus, MD [facility] [place] [address]  Stage IIA adenocarcinoma of the colon  CURRENT THERAPY: Adjuvant FOLFOX   INTERVAL HISTORY: [name] 69 y.o. male returns for followup of Stage IIA adenocarcinoma of colon, S/P definitive surgery by Dr. Arnoldo Morale on 04/03/2015, and now undergoing adjuvant FOLFOX chemotherapy.   The patient is with his wife today and presents today for treatment.  He has not done treatment in a week due to him not feeling well last week.  His appetite was bad and he was losing weight.  He denies trouble with his bowels, vomiting, or nausea.  He states that food no longer has a taste.  The only thing he eats is instant breakfast for the first three days after chemotherapy. He says that he has gained 6 lbs.   His wife states that 3-4 days after chemo, he does not do anything.  He is active afterwards doing most of his normal activities.  He does not do as much house work as before.  He states that he would like to be "better and do everything that he used to do 6-8 months ago".  He does not drive anymore because he can no longer judge distance.   He cannot get himself into his 4 wheel drive truck which he attributes to weakness.    Colon carcinoma   03/28/2015 Initial Biopsy Diagnosis 1. Colon, biopsy, left descending - INVASIVE ADENOCARCINOMA. 2. Stomach, biopsy - GASTRIC BODY AND ANTRAL-TYPE MUCOSA WITH ASSOCIATED MINIMAL CHRONIC INFLAMMATION. - NO EVIDENCE OF HELICOBACTER PYLORI, INTESTINAL METAPLASIA, DYSPLASIA OR MA   03/29/2015 Imaging CT abd/pelvis- Circumferential mucosal lesion the proximal sigmoid colon consists with colorectal carcinoma.   04/03/2015 Definitive Surgery Colon, segmental resection for tumor - INVASIVE ADENOCARCINOMA, INVADING THROUGH THE MUSCULARIS PROPRIA INTO PERICOLONIC FATTY TISSUE. - FOURTEEN LYMPH NODES, NEGATIVE FOR METASTATIC CARCINOMA (0/14). - RESECTION MARGINS, NEGATIVE FOR ATYPIA OR MALIGNA     05/16/2015 -  Chemotherapy FOLFOX    05/31/2015 Treatment Plan Change Oxaliplatin dose reduced by 20% secondary to fatigue from cycle 1 of therapy.     Past Medical History  Diagnosis Date  . Varicose veins   . New onset atrial fibrillation 01/02/2015  . Morbid obesity 01/02/2015  . Hypertension   . Dysrhythmia     Afib- on set 12/2014  . Sleep apnea     been tested but has not received the CPAP yet  . GERD (gastroesophageal reflux disease)   . History of gout   . Cancer     colon  . Family history of colon cancer   . Family history of breast cancer in mother     has Varicose veins of lower extremities with other complications; New onset atrial fibrillation; Morbid obesity; Melena; Heme positive stool; Esophageal dysphagia; Diarrhea; Occult blood in stools; Dysphagia, pharyngoesophageal phase; Colon carcinoma; Family history of colon cancer; and Family history of breast cancer in mother on his problem list.   He believes that his sister died of heart issues, she died in her sleep.   has No Known Allergies.  Current Outpatient Prescriptions on File Prior to Visit  Medication Sig Dispense Refill  . apixaban (ELIQUIS) 5 MG TABS tablet Take 5 mg by mouth daily.    [name] dextrose 5 % SOLN 1,000 mL with fluorouracil 5 GM/100ML SOLN Inject into the vein every 14 (fourteen) days. To infuse over 46 hours. To be given in near future    .  escitalopram (LEXAPRO) 20 MG tablet Take 1 tablet (20 mg total) by mouth daily. 30 tablet 2  . HYDROcodone-acetaminophen (NORCO/VICODIN) 5-325 MG per tablet Take 1-2 tablets by mouth every 6 (six) hours as needed for moderate pain. 40 tablet 0  . ibuprofen (ADVIL,MOTRIN) 200 MG tablet Take 400 mg by mouth every 6 (six) hours as needed for mild pain or moderate pain (for knee pain).    [name] LEUCOVORIN CALCIUM IV Inject into the vein every 14 (fourteen) days. To be started in near future    . lidocaine-prilocaine (EMLA) cream Apply a quarter size amount to port site 1  hour prior to chemo. Do not rub in. Cover with plastic wrap. 30 g 3  . lisinopril (PRINIVIL,ZESTRIL) 10 MG tablet Take 1 tablet (10 mg total) by mouth daily. 30 tablet 12  . omeprazole (PRILOSEC) 20 MG capsule Take 1 capsule (20 mg total) by mouth daily. 30 capsule 3  . ondansetron (ZOFRAN) 8 MG tablet Take 1 tablet (8 mg total) by mouth every 8 (eight) hours as needed. 30 tablet 3  . OXALIPLATIN IV Inject into the vein every 14 (fourteen) days. To start in near future    . potassium chloride SA (K-DUR,KLOR-CON) 20 MEQ tablet Take 1 tablet (20 mEq total) by mouth 2 (two) times daily. 14 tablet 0  . prochlorperazine (COMPAZINE) 10 MG tablet Take 1 tablet (10 mg total) by mouth every 6 (six) hours as needed for nausea or vomiting. 30 tablet 3   No current facility-administered medications on file prior to visit.    Past Surgical History  Procedure Laterality Date  . Vein ligation and stripping      left leg  . Colonoscopy N/A 03/28/2015    Procedure: COLONOSCOPY;  Surgeon: [name], MD;  [facility];  Service: Endoscopy;  Laterality: N/A;  . Esophagogastroduodenoscopy N/A 03/28/2015    Procedure: ESOPHAGOGASTRODUODENOSCOPY (EGD);  Surgeon: [name], MD;  [facility];  Service: Endoscopy;  Laterality: N/A;  . Esophageal dilation N/A 03/28/2015    Procedure: ESOPHAGEAL DILATION;  Surgeon: [name], MD;  [facility];  Service: Endoscopy;  Laterality: N/A;  . Partial colectomy N/A 04/03/2015    Procedure: PARTIAL COLECTOMY;  Surgeon: [name], MD;  Location: [facility];  Service: General;  Laterality: N/A;  . Portacath placement N/A 05/10/2015    Procedure: INSERTION PORT-A-CATH;  Surgeon: [name], MD;  Location: [facility];  Service: General;  Laterality: N/A;  left subclavian    Denies any headaches, dizziness, double vision, fevers, chills, night sweats, nausea, vomiting, diarrhea, constipation, chest pain, heart palpitations, shortness  of breath, blood in stool, black tarry stool, urinary pain, urinary burning, urinary frequency, hematuria. Positive for weight loss. Patient feels he has a good appetite, increased activity 14 point review of systems was performed and is negative except as detailed under history of present illness and above    PHYSICAL EXAMINATION  ECOG PERFORMANCE STATUS: 1 - Symptomatic but completely ambulatory  Filed Vitals:   07/24/15 0800  BP: 120/67  Pulse: 92  Temp: 97.6 F (36.4 C)  Resp: 20    GENERAL:alert, no distress, well nourished, well developed, comfortable, cooperative, obese. Mood is excellent today. He is smiling and very talkative SKIN: skin color, texture, turgor are normal, no rashes, R check with dark skin lesion approx 5 mm in largest dimension, irregular HEAD: Normocephalic, No masses, lesions, tenderness or abnormalities EYES: normal, PERRLA, EOMI, Conjunctiva are pink  and non-injected EARS: External ears normal OROPHARYNX:lips, buccal mucosa, and tongue normal and mucous membranes are moist  NECK: supple, thyroid normal size, non-tender, without nodularity, trachea midline LYMPH:  not examined BREAST:not examined LUNGS: clear to auscultation      Coarse breath sounds in right lower lung, cleared with cough HEART: regular rate & rhythm ABDOMEN:abdomen soft, obese and normal bowel sounds BACK: Back symmetric, no curvature. EXTREMITIES:less then 2 second capillary refill, no joint deformities, effusion, or inflammation, no skin discoloration      Left lower extremity is larger than the right. (chronic) NEURO: alert & oriented x 3 with fluent speech, no focal motor/sensory deficits  LABORATORY DATA:  CBC    Component Value Date/Time   WBC 6.3 07/17/2015 0930   RBC 3.85* 07/17/2015 0930   HGB 12.7* 07/17/2015 0930   HCT 36.8* 07/17/2015 0930   PLT 111* 07/17/2015 0930   MCV 95.6 07/17/2015 0930   MCH 33.0 07/17/2015 0930   MCHC 34.5 07/17/2015 0930   RDW 17.7*  07/17/2015 0930   LYMPHSABS 1.4 07/17/2015 0930   MONOABS 0.9 07/17/2015 0930   EOSABS 0.1 07/17/2015 0930   BASOSABS 0.1 07/17/2015 0930      Chemistry      Component Value Date/Time   NA 137 07/17/2015 0930   K 3.7 07/17/2015 0930   CL 105 07/17/2015 0930   CO2 25 07/17/2015 0930   BUN 11 07/17/2015 0930   CREATININE 0.89 07/17/2015 0930      Component Value Date/Time   CALCIUM 8.6* 07/17/2015 0930   ALKPHOS 129* 07/17/2015 0930   AST 37 07/17/2015 0930   ALT 20 07/17/2015 0930   BILITOT 1.3* 07/17/2015 0930      Lab Results  Component Value Date   CEA 3.0 04/27/2015    ASSESSMENT AND PLAN:  Stage II colorectal cancer Adjuvant FOLFOX Family history of colon cancer Situational depression/anxiety Treatment related fatigue  His weight has fluctuated up and down throughout treatment but is certainly improved today. I again spent time reviewing goals of therapy and the fact that his benefit from chemotherapy is small. He has a family history of colon cancer and is very fearful of it coming back. I again advised him that we do not want to leave him with disability after completion of treatment. His wife feels he realistically does most everything he did before he got sick. They both note the only thing he really does not do is driving. He is to continue his anti-depressant. His mood is markedly improved.  I have discontinued his 5-FU bolus. We will reassess where he is at his next follow-up appointment. We will proceed with treatment today with the changes as detailed. They are to call in the interim with problems or concerns.  He will be referred to dermatology for the lesion on the right cheek.  All questions were answered. The patient knows to call the clinic with any problems, questions or concerns. We can certainly see the patient much sooner if necessary.   This document serves as a record of services personally performed by Ancil Linsey, MD. It was created on her  behalf by Janace Hoard, a trained medical scribe. The creation of this record is based on the scribe's personal observations and the provider's statements to them. This document has been checked and approved by the attending provider.  I have reviewed the above documentation for accuracy and completeness, and I agree with the above.  This note was electronically signed.  Larene Beach  Elio Forget, MD

## 2015-07-24 NOTE — Progress Notes (Signed)
[name] Hard Tolerated chemotherapy well discharged ambulatory with pump

## 2015-07-24 NOTE — Patient Instructions (Signed)
[facility] Discharge Instructions for Patients Receiving Chemotherapy  Today you received the following chemotherapy agents folfox Chemotherapy today Return Wednesday to have pump removed Return in 2 weeks for chemotherapy and to see the doctor Please call the clinic if you have any questions or concerns   To help prevent nausea and vomiting after your treatment, we encourage you to take your nausea medication    If you develop nausea and vomiting, or diarrhea that is not controlled by your medication, call the clinic.  The clinic phone number is [phone]. Office hours are Monday-Friday 8:30am-5:00pm.  BELOW ARE SYMPTOMS THAT SHOULD BE REPORTED IMMEDIATELY:  *FEVER GREATER THAN 101.0 F  *CHILLS WITH OR WITHOUT FEVER  NAUSEA AND VOMITING THAT IS NOT CONTROLLED WITH YOUR NAUSEA MEDICATION  *UNUSUAL SHORTNESS OF BREATH  *UNUSUAL BRUISING OR BLEEDING  TENDERNESS IN MOUTH AND THROAT WITH OR WITHOUT PRESENCE OF ULCERS  *URINARY PROBLEMS  *BOWEL PROBLEMS  UNUSUAL RASH Items with * indicate a potential emergency and should be followed up as soon as possible. If you have an emergency after office hours please contact your primary care physician or go to the nearest emergency department.  Please call the clinic during office hours if you have any questions or concerns.   You may also contact the Patient Navigator at [phone] should you have any questions or need assistance in obtaining follow up care. _____________________________________________________________________ Have you asked about our STAR program?    STAR stands for Survivorship Training and Rehabilitation, and this is a nationally recognized cancer care program that focuses on survivorship and rehabilitation.  Cancer and cancer treatments may cause problems, such as, pain, making you feel tired and keeping you from doing the things that you need or want to do. Cancer rehabilitation can help. Our  goal is to reduce these troubling effects and help you have the best quality of life possible.  You may receive a survey from a nurse that asks questions about your current state of health.  Based on the survey results, all eligible patients will be referred to the [facility] program for an evaluation so we can better serve you! A frequently asked questions sheet is available upon request.

## 2015-07-26 ENCOUNTER — Encounter (HOSPITAL_COMMUNITY): Payer: Medicare Other

## 2015-07-26 ENCOUNTER — Encounter (HOSPITAL_BASED_OUTPATIENT_CLINIC_OR_DEPARTMENT_OTHER): Payer: Medicare Other

## 2015-07-26 ENCOUNTER — Encounter (HOSPITAL_COMMUNITY): Payer: Self-pay

## 2015-07-26 VITALS — BP 118/70 | HR 98 | Temp 97.8°F | Resp 18

## 2015-07-26 DIAGNOSIS — C189 Malignant neoplasm of colon, unspecified: Secondary | ICD-10-CM

## 2015-07-26 DIAGNOSIS — C186 Malignant neoplasm of descending colon: Secondary | ICD-10-CM

## 2015-07-26 DIAGNOSIS — Z5189 Encounter for other specified aftercare: Secondary | ICD-10-CM | POA: Diagnosis not present

## 2015-07-26 MED ORDER — HEPARIN SOD (PORK) LOCK FLUSH 100 UNIT/ML IV SOLN
INTRAVENOUS | Status: AC
  Filled 2015-07-26: qty 5

## 2015-07-26 MED ORDER — PEGFILGRASTIM 6 MG/0.6ML ~~LOC~~ PSKT
6.0000 mg | PREFILLED_SYRINGE | Freq: Once | SUBCUTANEOUS | Status: AC
  Administered 2015-07-26: 6 mg via SUBCUTANEOUS
  Filled 2015-07-26: qty 0.6

## 2015-07-26 MED ORDER — HEPARIN SOD (PORK) LOCK FLUSH 100 UNIT/ML IV SOLN
500.0000 [IU] | Freq: Once | INTRAVENOUS | Status: AC | PRN
  Administered 2015-07-26: 500 [IU]

## 2015-07-26 MED ORDER — SODIUM CHLORIDE 0.9 % IJ SOLN
10.0000 mL | INTRAMUSCULAR | Status: DC | PRN
  Administered 2015-07-26: 10 mL
  Filled 2015-07-26: qty 10

## 2015-07-26 NOTE — Progress Notes (Signed)
Darrick Penna Fincanonpresents today for neulasta OBI placement per MD orders.  OBI device filled per protocol and placed on left Upper Arm.  Needle/catheter placement noted prior to patient leaving. Tolerated without incident and aware of injection to be delivered in 27 hours.

## 2015-07-26 NOTE — Patient Instructions (Signed)
[facility] Discharge Instructions  RECOMMENDATIONS MADE BY THE CONSULTANT AND ANY TEST RESULTS WILL BE SENT TO YOUR REFERRING PHYSICIAN.  Pump removal and port flush today. Neulasta on body injector placed today. Call the clinic with any questions or concerns. Return as scheduled for chemotherapy and office visit.  Thank you for choosing [facility] to provide your oncology and hematology care.  To afford each patient quality time with our provider, please arrive at least 15 minutes before your scheduled appointment time.    You need to re-schedule your appointment should you arrive 10 or more minutes late.  We strive to give you quality time with our providers, and arriving late affects you and other patients whose appointments are after yours.  Also, if you no show three or more times for appointments you may be dismissed from the clinic at the providers discretion.     Again, thank you for choosing [facility].  Our hope is that these requests will decrease the amount of time that you wait before being seen by our physicians.       _____________________________________________________________  Should you have questions after your visit to [facility], please contact our office at [phone] between the hours of 8:30 a.m. and 4:30 p.m.  Voicemails left after 4:30 p.m. will not be returned until the following business day.  For prescription refill requests, have your pharmacy contact our office.

## 2015-07-27 ENCOUNTER — Ambulatory Visit (HOSPITAL_COMMUNITY): Payer: Medicare Other | Admitting: Physical Therapy

## 2015-07-27 DIAGNOSIS — R6889 Other general symptoms and signs: Secondary | ICD-10-CM

## 2015-07-27 DIAGNOSIS — R262 Difficulty in walking, not elsewhere classified: Secondary | ICD-10-CM

## 2015-07-27 NOTE — Therapy (Signed)
[facility] [address] [place], [place], [address] Phone: [phone]   Fax:  [phone]  Physical Therapy Treatment  Patient Details  Name: [name] MRN: [id number] Date of Birth: Jun 22, 1946 Referring Provider:  [name], MD  Encounter Date: 07/27/2015      PT End of Session - 07/27/15 1644    Visit Number 8   Number of Visits 10   Authorization Type Medicare   Authorization Time Period G-codes done 4th visit    PT Start Time 6389   PT Stop Time 1431   PT Time Calculation (min) 44 min   Activity Tolerance Patient tolerated treatment well   Behavior During Therapy [facility] for tasks assessed/performed      Past Medical History  Diagnosis Date  . Varicose veins   . New onset atrial fibrillation 01/02/2015  . Morbid obesity 01/02/2015  . Hypertension   . Dysrhythmia     Afib- on set 12/2014  . Sleep apnea     been tested but has not received the CPAP yet  . GERD (gastroesophageal reflux disease)   . History of gout   . Cancer     colon  . Family history of colon cancer   . Family history of breast cancer in mother     Past Surgical History  Procedure Laterality Date  . Vein ligation and stripping      left leg  . Colonoscopy N/A 03/28/2015    Procedure: COLONOSCOPY;  Surgeon: [name], MD;  [facility];  Service: Endoscopy;  Laterality: N/A;  . Esophagogastroduodenoscopy N/A 03/28/2015    Procedure: ESOPHAGOGASTRODUODENOSCOPY (EGD);  Surgeon: [name], MD;  [facility];  Service: Endoscopy;  Laterality: N/A;  . Esophageal dilation N/A 03/28/2015    Procedure: ESOPHAGEAL DILATION;  Surgeon: [name], MD;  [facility];  Service: Endoscopy;  Laterality: N/A;  . Partial colectomy N/A 04/03/2015    Procedure: PARTIAL COLECTOMY;  Surgeon: [name], MD;  Location[facility];  Service: General;  Laterality: N/A;  . Portacath placement N/A 05/10/2015   Procedure: INSERTION PORT-A-CATH;  Surgeon: [name], MD;  Location: [facility];  Service: General;  Laterality: N/A;  left subclavian    There were no vitals filed for this visit.  Visit Diagnosis:  Decreased functional activity tolerance  Difficulty walking      Subjective Assessment - 07/27/15 1351    Subjective Pt reports that he is still getting cancer treatments, and he hasn't been outside much because it has been too hot. Pt reports that he feels that he has been walking better since beginning therapy, and he has noticed that his endurance has improved. He states that he has not been walking as much as he should be, and he has not been keeping track of his pedometer reading. He reports that he hasn't had much difficulty with anything, other than the fact that he has knee pain.    How long can you sit comfortably? no limitations   How long can you stand comfortably? 10 minutes   How long can you walk comfortably? up to 10 minutes   Currently in Pain? No/denies   Pain Score 0-No pain            [facility] PT Assessment - 07/27/15 0001    Strength   Right Hip Flexion 4/5   Left Hip Flexion 4/5   Right Knee Flexion 4+/5  Right Knee Extension 5/5   Left Knee Flexion 4+/5   Left Knee Extension 5/5   Right Ankle Dorsiflexion 5/5   Left Ankle Dorsiflexion 5/5   6 Minute Walk- Baseline   6 Minute Walk- Baseline yes   6 minute walk test results    Aerobic Endurance Distance Walked 1017   Endurance additional comments 0.42ms   Standardized Balance Assessment   Five times sit to stand comments  11.91 seconds   Timed Up and Go Test   Normal TUG (seconds) 12.61                  PT Education - [id number]    Education provided Yes   Education Details Educated on importance of walking program, keeping up with pedometer reading, compliance with HEP.    Person(s) Educated Patient   Methods Explanation   Comprehension Verbalized understanding          PT Short  Term Goals - 008/16/161647    PT SHORT TERM GOAL #1   Title Patient will be able to ambualte at 1.274m indicating community ambualtory status   Status Not Met   PT SHORT TERM GOAL #2   Title Patient will be able to ambulate 1055mtes without resting.    Status Achieved   PT SHORT TERM GOAL #3   Title Patinet will be independent with monitoring step count   Status Not Met   PT SHORT TERM GOAL #4   Title Patient will average > 4000 steps a day   Status Not Met           PT Long Term Goals - 07/08-16-1647    PT LONG TERM GOAL #1   Title Patient will be ebale to ambualte > 89m9mes without resting   Status Not Met   PT LONG TERM GOAL #2   Title Patient will ambualte > 10000 steps per day   Status Not Met   PT LONG TERM GOAL #3   Title Patient will be independent with HEP and attend [facility] Not Met               Plan - 07/2Aug 16, 20165    Clinical Impression Statement Reassessment was completed today. Pt has shown minimal progress in PT in regards to strength, endurance, and gait speed, as evidenced by functional outcome measures of the five time sit to stand, TUG, and 6 minute walk test. Pt has reported that he has not been compliant with his HEP, with walking program, or with monitoring his pedometer. Pt is agreeable to d/c to HEP, and was advised to discuss with his MD returning to PT once his chemo treatments are completed.  Pt is being d/c from PT due to lack of progress and limited compliance.    Consulted and Agree with Plan of Care Patient          G-Codes - 07/2Aug 16, 20168    Functional Assessment Tool Used Based on skilled clinical assessment of functional activity tolerance, strength, gait    Functional Limitation Mobility: Walking and moving around   Mobility: Walking and Moving Around Goal Status ([phone] least 60 percent but less than 80 percent impaired, limited or restricted   Mobility: Walking and Moving Around Discharge  Status ([phone] least 80 percent but less than 100 percent impaired, limited or restricted      Problem List Patient Active Problem List   Diagnosis Date Noted  . Family  history of colon cancer   . Family history of breast cancer in mother   . Colon carcinoma 04/03/2015  . Occult blood in stools   . Dysphagia, pharyngoesophageal phase   . Melena 03/15/2015  . Heme positive stool 03/15/2015  . Esophageal dysphagia 03/15/2015  . Diarrhea 03/15/2015  . New onset atrial fibrillation 01/02/2015  . Morbid obesity 01/02/2015  . Varicose veins of lower extremities with other complications 73/07/1682    PHYSICAL THERAPY DISCHARGE SUMMARY  Visits from Start of Care: 8  Current functional level related to goals / functional outcomes: Pt walked 1017 feet during 6 minute walk test, putting him at 0.86 m/s gait speed, completed TUG in 12.61 seconds, and five time sit to stand 11.91 seconds. He has made little progress towards LTGs due to decreased compliance with walking program.    Remaining deficits: Pt continues to demonstrate decreased endurance and functional activity tolerance.    Education / Equipment: HEP prescribed for BLE strengthening and walking program.  Plan: Patient agrees to discharge.  Patient goals were partially met. Patient is being discharged due to lack of progress.  ?????      Hilma Favors, PT, DPT [phone] 07/27/2015, 4:54 PM  [facility] [address] [place], [place], [address] Phone: [phone]   Fax:  [phone]

## 2015-07-31 ENCOUNTER — Encounter: Payer: Self-pay | Admitting: Gastroenterology

## 2015-08-03 ENCOUNTER — Ambulatory Visit (HOSPITAL_COMMUNITY): Payer: Medicare Other | Admitting: Physical Therapy

## 2015-08-04 ENCOUNTER — Encounter (HOSPITAL_COMMUNITY): Payer: Self-pay | Admitting: *Deleted

## 2015-08-04 ENCOUNTER — Other Ambulatory Visit (HOSPITAL_COMMUNITY): Payer: Self-pay | Admitting: *Deleted

## 2015-08-04 DIAGNOSIS — L0390 Cellulitis, unspecified: Secondary | ICD-10-CM | POA: Insufficient documentation

## 2015-08-04 DIAGNOSIS — C189 Malignant neoplasm of colon, unspecified: Secondary | ICD-10-CM

## 2015-08-04 MED ORDER — SULFAMETHOXAZOLE-TRIMETHOPRIM 800-160 MG PO TABS: 1.0000 | ORAL_TABLET | Freq: Two times a day (BID) | ORAL | Status: DC

## 2015-08-04 NOTE — Progress Notes (Signed)
Patient came in today to show [name] some swelling that he has in the left lower extremity (medial-posterior above knee). Tom assessed patient and said it looked like cellulitis and started him on Bactrim. Print out of cellulitis given to pt. Pt to take Bactrim x 7 days. Patient's vital signs BP 100/64, 100% RA, HR 92, T 97.6, R20. No fever or chills. Patient instructed that if redness, pain, swelling increased or if he began to feel bad or have fever/chills that he MUST come to ER. He verbalized understanding of these instructions.

## 2015-08-05 ENCOUNTER — Emergency Department (HOSPITAL_COMMUNITY)
Admission: EM | Admit: 2015-08-05 | Discharge: 2015-08-05 | Disposition: A | Discharge status: Home / Self Care | Payer: Medicare Other | Attending: Emergency Medicine | Admitting: Emergency Medicine

## 2015-08-05 ENCOUNTER — Encounter (HOSPITAL_COMMUNITY): Payer: Self-pay | Admitting: Emergency Medicine

## 2015-08-05 DIAGNOSIS — K219 Gastro-esophageal reflux disease without esophagitis: Secondary | ICD-10-CM | POA: Diagnosis not present

## 2015-08-05 DIAGNOSIS — Z85038 Personal history of other malignant neoplasm of large intestine: Secondary | ICD-10-CM | POA: Insufficient documentation

## 2015-08-05 DIAGNOSIS — Z79899 Other long term (current) drug therapy: Secondary | ICD-10-CM | POA: Insufficient documentation

## 2015-08-05 DIAGNOSIS — I10 Essential (primary) hypertension: Secondary | ICD-10-CM | POA: Insufficient documentation

## 2015-08-05 DIAGNOSIS — Z7901 Long term (current) use of anticoagulants: Secondary | ICD-10-CM | POA: Insufficient documentation

## 2015-08-05 DIAGNOSIS — L03119 Cellulitis of unspecified part of limb: Secondary | ICD-10-CM

## 2015-08-05 DIAGNOSIS — L03116 Cellulitis of left lower limb: Secondary | ICD-10-CM | POA: Diagnosis not present

## 2015-08-05 DIAGNOSIS — M79605 Pain in left leg: Secondary | ICD-10-CM | POA: Diagnosis present

## 2015-08-05 DIAGNOSIS — L02419 Cutaneous abscess of limb, unspecified: Secondary | ICD-10-CM

## 2015-08-05 DIAGNOSIS — I4891 Unspecified atrial fibrillation: Secondary | ICD-10-CM | POA: Diagnosis not present

## 2015-08-05 LAB — BASIC METABOLIC PANEL
Anion gap: 7 (ref 5–15)
BUN: 9 mg/dL (ref 6–20)
CALCIUM: 8.6 mg/dL — AB (ref 8.9–10.3)
CHLORIDE: 106 mmol/L (ref 101–111)
CO2: 25 mmol/L (ref 22–32)
CREATININE: 0.95 mg/dL (ref 0.61–1.24)
GFR calc Af Amer: >60 mL/min (ref >60)
GFR calc non Af Amer: >60 mL/min (ref >60)
GLUCOSE: 100 mg/dL — AB (ref 65–99)
POTASSIUM: 4.4 mmol/L (ref 3.5–5.1)
SODIUM: 138 mmol/L (ref 135–145)

## 2015-08-05 LAB — CBC WITH DIFFERENTIAL/PLATELET
BASOS ABS: 0 10*3/uL (ref 0.0–0.1)
Basophils Relative: 0 % (ref 0–1)
Eosinophils Absolute: 0.1 10*3/uL (ref 0.0–0.7)
Eosinophils Relative: 1 % (ref 0–5)
HCT: 34.9 % — ABNORMAL LOW (ref 39.0–52.0)
HEMOGLOBIN: 11.7 g/dL — AB (ref 13.0–17.0)
LYMPHS ABS: 1.7 10*3/uL (ref 0.7–4.0)
Lymphocytes Relative: 14 % (ref 12–46)
MCH: 33.3 pg (ref 26.0–34.0)
MCHC: 33.5 g/dL (ref 30.0–36.0)
MCV: 99.4 fL (ref 78.0–100.0)
MONO ABS: 1.5 10*3/uL — AB (ref 0.1–1.0)
Monocytes Relative: 12 % (ref 3–12)
NEUTROS ABS: 8.5 10*3/uL — AB (ref 1.7–7.7)
Neutrophils Relative %: 72 % (ref 43–77)
Platelets: 83 10*3/uL — ABNORMAL LOW (ref 150–400)
RBC: 3.51 MIL/uL — ABNORMAL LOW (ref 4.22–5.81)
RDW: 17.7 % — ABNORMAL HIGH (ref 11.5–15.5)
WBC: 11.8 10*3/uL — ABNORMAL HIGH (ref 4.0–10.5)

## 2015-08-05 MED ORDER — CEPHALEXIN 500 MG PO CAPS: 500.0000 mg | ORAL_CAPSULE | Freq: Four times a day (QID) | ORAL | Status: DC

## 2015-08-05 MED ORDER — LIDOCAINE-EPINEPHRINE (PF) 2 %-1:200000 IJ SOLN
20.0000 mL | Freq: Once | INTRAMUSCULAR | Status: AC
  Administered 2015-08-05: 20 mL
  Filled 2015-08-05: qty 20

## 2015-08-05 NOTE — ED Notes (Signed)
Was treated here yesterday in ER and site to left leg has gotten worth.  Rates pain denies pain.  Site warm, red and tender to touch.

## 2015-08-05 NOTE — Discharge Instructions (Signed)
Please continue to take your antibiotics as prescribed, both Keflex and Bactrim. Return without fail for worsening symptoms, including increased swelling/redness of your wound, fever, night sweats, nausea or vomiting, or any other symptoms concerning to you. Please return to the [facility] on Monday for follow-up wound care.  Abscess An abscess (boil or furuncle) is an infected area on or under the skin. This area is filled with yellowish-white fluid (pus) and other material (debris). HOME CARE   Only take medicines as told by your doctor.  If you were given antibiotic medicine, take it as directed. Finish the medicine even if you start to feel better.  If gauze is used, follow your doctor's directions for changing the gauze.  To avoid spreading the infection:  Keep your abscess covered with a bandage.  Wash your hands well.  Do not share personal care items, towels, or whirlpools with others.  Avoid skin contact with others.  Keep your skin and clothes clean around the abscess.  Keep all doctor visits as told. GET HELP RIGHT AWAY IF:   You have more pain, puffiness (swelling), or redness in the wound site.  You have more fluid or blood coming from the wound site.  You have muscle aches, chills, or you feel sick.  You have a fever. MAKE SURE YOU:   Understand these instructions.  Will watch your condition.  Will get help right away if you are not doing well or get worse. Document Released: 06/03/2008 Document Revised: 06/16/2012 Document Reviewed: 02/28/2012 [facility] Patient Information 2015 [place], [place]. This information is not intended to replace advice given to you by your health care provider. Make sure you discuss any questions you have with your health care provider.  Abscess Care After An abscess (also called a boil or furuncle) is an infected area that contains a collection of pus. Signs and symptoms of an abscess include pain, tenderness, redness, or  hardness, or you may feel a moveable soft area under your skin. An abscess can occur anywhere in the body. The infection may spread to surrounding tissues causing cellulitis. A cut (incision) by the surgeon was made over your abscess and the pus was drained out. Gauze may have been packed into the space to provide a drain that will allow the cavity to heal from the inside outwards. The boil may be painful for 5 to 7 days. Most people with a boil do not have high fevers. Your abscess, if seen early, may not have localized, and may not have been lanced. If not, another appointment may be required for this if it does not get better on its own or with medications. HOME CARE INSTRUCTIONS   Only take over-the-counter or prescription medicines for pain, discomfort, or fever as directed by your caregiver.  When you bathe, soak and then remove gauze or iodoform packs at least daily or as directed by your caregiver. You may then wash the wound gently with mild soapy water. Repack with gauze or do as your caregiver directs. SEEK IMMEDIATE MEDICAL CARE IF:   You develop increased pain, swelling, redness, drainage, or bleeding in the wound site.  You develop signs of generalized infection including muscle aches, chills, fever, or a general ill feeling.  An oral temperature above 102 F (38.9 C) develops, not controlled by medication. See your caregiver for a recheck if you develop any of the symptoms described above. If medications (antibiotics) were prescribed, take them as directed. Document Released: 07/04/2005 Document Revised: 03/09/2012 Document Reviewed: 02/29/2008  [employer]® Patient Information ©2015 [employer]. This information is not intended to replace advice given to you by your health care provider. Make sure you discuss any questions you have with your health care provider. ° °

## 2015-08-05 NOTE — ED Provider Notes (Signed)
CSN: [id number]     Arrival date & time 08/05/15  1242 History   First MD Initiated Contact with Patient 08/05/15 1426     Chief Complaint  Patient presents with  . Cellulitis    left leg     (Consider location/radiation/quality/duration/timing/severity/associated sxs/prior Treatment) HPI 69 year old male who presents with cellulitis. History of stage IIA adenocarcinoma of colon s/p surgical resection and on FOLFOX chemotherapy, last 2 weeks ago. Also takes apixaban.  He was in the cancer center yesterday for evaluation of leg pain. He developed a knot over his left inner thigh 3 days ago that progressed to redness, pain and warmth to the inner thigh. Was diagnosed with cellulitis and placed on bactrim. Felt that redness increased in size over past day and presents to ED for re-evaluation.   Denies fever, chills, altered mental status, difficulty breathing, chest pain, abdominal pain, nausea vomiting, recent illness, weakness, rigors, or other complaints.    Past Medical History  Diagnosis Date  . Varicose veins   . New onset atrial fibrillation 01/02/2015  . Morbid obesity 01/02/2015  . Hypertension   . Dysrhythmia     Afib- on set 12/2014  . Sleep apnea     been tested but has not received the CPAP yet  . GERD (gastroesophageal reflux disease)   . History of gout   . Cancer     colon  . Family history of colon cancer   . Family history of breast cancer in mother    Past Surgical History  Procedure Laterality Date  . Vein ligation and stripping      left leg  . Colonoscopy N/A 03/28/2015    Procedure: COLONOSCOPY;  Surgeon: [name], MD;  [facility];  Service: Endoscopy;  Laterality: N/A;  . Esophagogastroduodenoscopy N/A 03/28/2015    Procedure: ESOPHAGOGASTRODUODENOSCOPY (EGD);  Surgeon: [name], MD;  Location: [facility];  Service: Endoscopy;  Laterality: N/A;  . Esophageal dilation N/A 03/28/2015    Procedure: ESOPHAGEAL DILATION;  Surgeon: [name], MD;  [facility]: [facility];  Service: Endoscopy;  Laterality: N/A;  . Partial colectomy N/A 04/03/2015    Procedure: PARTIAL COLECTOMY;  Surgeon: [name], MD;  Location: [facility];  Service: General;  Laterality: N/A;  . Portacath placement N/A 05/10/2015    Procedure: INSERTION PORT-A-CATH;  Surgeon: [name], MD;  Location: [facility];  Service: General;  Laterality: N/A;  left subclavian   Family History  Problem Relation Age of Onset  . Breast cancer Mother     dx under 49  . Colon cancer Mother     dx in her mid 84s  . Heart disease Father   . Hyperlipidemia Father   . Liver cancer Father     heavy ETOH user when young  . Cancer Sister     slow "blood" cancer  . Diabetes Sister   . Diabetes Brother   . Cancer Maternal Aunt     2-3 maternal aunts with Cancer NOS  . Lung cancer Paternal Uncle     three uncles with lung cancer - all smokers  . Cancer Paternal Grandmother     possible bone cancer   History  Substance Use Topics  . Smoking status: Never Smoker   . Smokeless tobacco: Never Used  . Alcohol Use: No    Review of Systems 10/14 systems reviewed and are negative other than those stated in the HPI    Allergies  Review of patient's allergies indicates no known allergies.  Home Medications   Prior to Admission medications   Medication Sig Start Date End Date Taking? Authorizing Provider  apixaban (ELIQUIS) 5 MG TABS tablet Take 5 mg by mouth daily.   Yes Historical Provider, MD  escitalopram (LEXAPRO) 10 MG tablet Take 10 mg by mouth daily.  05/25/15  Yes Historical Provider, MD  escitalopram (LEXAPRO) 20 MG tablet Take 1 tablet (20 mg total) by mouth daily. 07/17/15  Yes [name], PA-C  lisinopril (PRINIVIL,ZESTRIL) 10 MG tablet Take 1 tablet (10 mg total) by mouth daily. 01/04/15  Yes [name], MD  sulfamethoxazole-trimethoprim (BACTRIM DS,SEPTRA DS) 800-160 MG per tablet Take 1 tablet by mouth 2 (two) times daily. 08/04/15  Yes [name], PA-C  cephALEXin (KEFLEX) 500 MG capsule Take 1 capsule (500 mg total) by mouth 4 (four) times daily. 08/05/15   Forde Dandy, MD  dextrose 5 % SOLN 1,000 mL with fluorouracil 5 GM/100ML SOLN Inject into the vein every 14 (fourteen) days. To infuse over 46 hours. To be given in near future    Historical Provider, MD  HYDROcodone-acetaminophen (NORCO/VICODIN) 5-325 MG per tablet Take 1-2 tablets by mouth every 6 (six) hours as needed for moderate pain. 04/07/15 04/06/16  Aviva Signs, MD  ibuprofen (ADVIL,MOTRIN) 200 MG tablet Take 400 mg by mouth every 6 (six) hours as needed for mild pain or moderate pain (for knee pain).    Historical Provider, MD  LEUCOVORIN CALCIUM IV Inject into the vein every 14 (fourteen) days. To be started in near future    Historical Provider, MD  lidocaine-prilocaine (EMLA) cream Apply a quarter size amount to port site 1 hour prior to chemo. Do not rub in. Cover with plastic wrap. 04/28/15   [name], MD  omeprazole (PRILOSEC) 20 MG capsule Take 1 capsule (20 mg total) by mouth daily. 03/15/15   [name], PA-C  ondansetron (ZOFRAN) 8 MG tablet Take 1 tablet (8 mg total) by mouth every 8 (eight) hours as needed. 04/28/15   [name], MD  OXALIPLATIN IV Inject into the vein every 14 (fourteen) days. To start in near future    Historical Provider, MD  potassium chloride SA (K-DUR,KLOR-CON) 20 MEQ tablet Take 1 tablet (20 mEq total) by mouth 2 (two) times daily. Patient not taking: Reported on 08/05/2015 06/20/15   Baird Cancer, PA-C  prochlorperazine (COMPAZINE) 10 MG tablet Take 1 tablet (10 mg total) by mouth every 6 (six) hours as needed for nausea or vomiting. 04/28/15   [name], MD   BP 117/66 mmHg  Pulse 85  Temp(Src) 98.1 F (36.7 C) (Oral)  Resp 18  Ht 5\' 7"  (1.702 m)  Wt 270 lb (122.471 kg)  BMI 42.28 kg/m2  SpO2 100% Physical Exam  Nursing note and vitals reviewed. Physical Exam  Constitutional: Well developed, well  nourished, non-toxic, and in no acute distress. Comfortably conversant at bedside. Head: Normocephalic and atraumatic.  Mouth/Throat: Oropharynx is clear and moist.  Neck: Normal range of motion. Neck supple.  Cardiovascular: Normal rate and regular rhythm.  +2 dp pulses. Normal capillary refill. No edema. Pulmonary/Chest: Effort normal and breath sounds normal.  Abdominal: Soft. There is no tenderness. There is no rebound and no guarding.  Musculoskeletal: Normal range of motion of left knee.  Neurological: Alert, fluent speech, moves all extremities symmetrically Skin: Skin is warm and dry. There is area of erythema, induration, and warmth with  underlying fluctuance involving medial left thigh just above the level of the knee. Erythema spreads to mid thigh.  Psychiatric: Cooperative  . ED Course  INCISION AND DRAINAGE Date/Time: 08/06/2015 12:15 AM Performed by: Brantley Stage DUO Authorized by: Brantley Stage DUO Consent: Verbal consent obtained. Risks and benefits: risks, benefits and alternatives were discussed Consent given by: patient Patient understanding: patient states understanding of the procedure being performed Patient identity confirmed: verbally with patient Time out: Immediately prior to procedure a "time out" was called to verify the correct patient, procedure, equipment, support staff and site/side marked as required. Type: abscess Body area: lower extremity Location details: left leg Anesthesia: local infiltration Local anesthetic: lidocaine 2% with epinephrine Anesthetic total: 8 ml Patient sedated: no Risk factor: underlying major vessel Scalpel size: 11 Incision type: single straight Complexity: simple Drainage: purulent and  bloody Drainage amount: moderate Wound treatment: wound left open Packing material: none Patient tolerance: Patient tolerated the procedure well with no immediate complications   (including critical care time) Labs Review Labs Reviewed  CBC  WITH DIFFERENTIAL/PLATELET - Abnormal; Notable for the following:    WBC 11.8 (*)    RBC 3.51 (*)    Hemoglobin 11.7 (*)    HCT 34.9 (*)    RDW 17.7 (*)    Platelets 83 (*)    Neutro Abs 8.5 (*)    Monocytes Absolute 1.5 (*)    All other components within normal limits  BASIC METABOLIC PANEL - Abnormal; Notable for the following:    Glucose, Bld 100 (*)    Calcium 8.6 (*)    All other components within normal limits    Imaging Review No results found.   EKG Interpretation None      MDM   Final diagnoses:  Cellulitis and abscess of leg   EMERGENCY DEPARTMENT US SOFT TISSUE INTERPRETATION "Study: Limited Ultrasound of the noted body part in comments below"  INDICATIONS: Soft tissue infection Multiple views of the body part are obtained with a multi-frequency linear probe  PERFORMED BY:  Myself  IMAGES ARCHIVED?: No  SIDE:Left  BODY PART:Lower extremity  FINDINGS: Abcess present and Cellulitis present  LIMITATIONS: none  INTERPRETATION:  Abcess present  COMMENT:  N/A    In short, this is a 69 year old male with history of colon cancer on CTX who presents with worsening cellulitis in setting of antibiotic use. He is non-toxic and in no acute distress on presentation. AF and VSS, and low suspicion for sepsis at this time. There is evidence of cellulitis involving the left medial thigh, but concern for abscess when bedside ultrasound performed. This is likely the reason why symptoms are not improving with antibiotics. I&D performed at bedside, and there was expressed of blood with scant pus. This is likely hematoma (apixaban use), that has been superinfected. Given source control, it was felt that he will likely to respond to antibiotics. Told to continue on bactrim but also started on Keflex for complete coverage.CBC only reveals mild leukocytosis, but no evidence overall of systemic illness to warrant IV antibiotics or admission at this time. Given immunosuppression,  he was given warning signs to return. He will have wound check at cancer center on Monday. Strict return and follow-up instructions reviewed. He expressed understanding of all discharge instructions and felt comfortable with the plan of care.     Forde Dandy, MD 08/06/15 [phone]

## 2015-08-06 ENCOUNTER — Encounter (HOSPITAL_COMMUNITY): Payer: Self-pay | Admitting: Emergency Medicine

## 2015-08-08 ENCOUNTER — Encounter (HOSPITAL_COMMUNITY): Payer: Medicare Other | Attending: Hematology & Oncology

## 2015-08-08 ENCOUNTER — Encounter (HOSPITAL_COMMUNITY): Payer: Self-pay | Admitting: Oncology

## 2015-08-08 ENCOUNTER — Encounter (HOSPITAL_BASED_OUTPATIENT_CLINIC_OR_DEPARTMENT_OTHER): Payer: Medicare Other | Admitting: Oncology

## 2015-08-08 VITALS — BP 111/73 | HR 82 | Temp 97.8°F | Resp 18

## 2015-08-08 VITALS — BP 116/73 | HR 93 | Temp 97.7°F | Resp 18 | Wt 261.8 lb

## 2015-08-08 DIAGNOSIS — Z5111 Encounter for antineoplastic chemotherapy: Secondary | ICD-10-CM | POA: Diagnosis present

## 2015-08-08 DIAGNOSIS — C189 Malignant neoplasm of colon, unspecified: Secondary | ICD-10-CM

## 2015-08-08 DIAGNOSIS — C186 Malignant neoplasm of descending colon: Secondary | ICD-10-CM

## 2015-08-08 DIAGNOSIS — L03116 Cellulitis of left lower limb: Secondary | ICD-10-CM

## 2015-08-08 DIAGNOSIS — C187 Malignant neoplasm of sigmoid colon: Secondary | ICD-10-CM | POA: Insufficient documentation

## 2015-08-08 DIAGNOSIS — R195 Other fecal abnormalities: Secondary | ICD-10-CM | POA: Insufficient documentation

## 2015-08-08 LAB — COMPREHENSIVE METABOLIC PANEL
ALK PHOS: 135 U/L — AB (ref 38–126)
ALT: 23 U/L (ref 17–63)
ANION GAP: 8 (ref 5–15)
AST: 45 U/L — AB (ref 15–41)
Albumin: 2.9 g/dL — ABNORMAL LOW (ref 3.5–5.0)
BILIRUBIN TOTAL: 1.4 mg/dL — AB (ref 0.3–1.2)
BUN: 7 mg/dL (ref 6–20)
CHLORIDE: 104 mmol/L (ref 101–111)
CO2: 23 mmol/L (ref 22–32)
CREATININE: 0.99 mg/dL (ref 0.61–1.24)
Calcium: 8.9 mg/dL (ref 8.9–10.3)
GFR calc Af Amer: >60 mL/min (ref >60)
GLUCOSE: 103 mg/dL — AB (ref 65–99)
Potassium: 4.1 mmol/L (ref 3.5–5.1)
Sodium: 135 mmol/L (ref 135–145)
Total Protein: 6.9 g/dL (ref 6.5–8.1)

## 2015-08-08 LAB — CBC WITH DIFFERENTIAL/PLATELET
BASOS ABS: 0 10*3/uL (ref 0.0–0.1)
Basophils Relative: 0 % (ref 0–1)
Eosinophils Absolute: 0.1 10*3/uL (ref 0.0–0.7)
Eosinophils Relative: 2 % (ref 0–5)
HEMATOCRIT: 37.7 % — AB (ref 39.0–52.0)
Hemoglobin: 12.7 g/dL — ABNORMAL LOW (ref 13.0–17.0)
Lymphocytes Relative: 19 % (ref 12–46)
Lymphs Abs: 1.3 10*3/uL (ref 0.7–4.0)
MCH: 33.3 pg (ref 26.0–34.0)
MCHC: 33.7 g/dL (ref 30.0–36.0)
MCV: 99 fL (ref 78.0–100.0)
MONO ABS: 0.6 10*3/uL (ref 0.1–1.0)
Monocytes Relative: 9 % (ref 3–12)
Neutro Abs: 4.9 10*3/uL (ref 1.7–7.7)
Neutrophils Relative %: 70 % (ref 43–77)
PLATELETS: 109 10*3/uL — AB (ref 150–400)
RBC: 3.81 MIL/uL — AB (ref 4.22–5.81)
RDW: 17.2 % — AB (ref 11.5–15.5)
WBC: 7 10*3/uL (ref 4.0–10.5)

## 2015-08-08 MED ORDER — SODIUM CHLORIDE 0.9 % IV SOLN
Freq: Once | INTRAVENOUS | Status: AC
  Administered 2015-08-08: 11:00:00 via INTRAVENOUS
  Filled 2015-08-08: qty 4

## 2015-08-08 MED ORDER — SODIUM CHLORIDE 0.9 % IJ SOLN: 10.0000 mL | INTRAMUSCULAR | Status: DC | PRN

## 2015-08-08 MED ORDER — OXALIPLATIN CHEMO INJECTION 100 MG/20ML
68.0000 mg/m2 | Freq: Once | INTRAVENOUS | Status: AC
  Administered 2015-08-08: 165 mg via INTRAVENOUS
  Filled 2015-08-08: qty 33

## 2015-08-08 MED ORDER — DEXTROSE 5 % IV SOLN
Freq: Once | INTRAVENOUS | Status: AC
  Administered 2015-08-08: 11:00:00 via INTRAVENOUS

## 2015-08-08 MED ORDER — HEPARIN SOD (PORK) LOCK FLUSH 100 UNIT/ML IV SOLN
500.0000 [IU] | Freq: Once | INTRAVENOUS | Status: DC | PRN
Start: 2015-08-08 — End: 2015-08-08

## 2015-08-08 MED ORDER — FLUOROURACIL CHEMO INJECTION 5 GM/100ML
2400.0000 mg/m2 | INTRAVENOUS | Status: DC
  Administered 2015-08-08: 5850 mg via INTRAVENOUS
  Filled 2015-08-08: qty 117

## 2015-08-08 MED ORDER — LEUCOVORIN CALCIUM INJECTION 350 MG
400.0000 mg/m2 | Freq: Once | INTRAVENOUS | Status: AC
  Administered 2015-08-08: 972 mg via INTRAVENOUS
  Filled 2015-08-08: qty 48.6

## 2015-08-08 NOTE — Patient Instructions (Addendum)
[place] at [facility] Discharge Instructions  RECOMMENDATIONS MADE BY THE CONSULTANT AND ANY TEST RESULTS WILL BE SENT TO YOUR REFERRING PHYSICIAN.  Exam completed by Kirby Crigler today Chemotherapy today as scheduled Return Thursday to have pump removed. Return in 1 week for follow up with the doctor Return in 2 weeks for follow up with the doctor and chemotherapy. Please call the clinic if you have any questions or concerns  Thank you for choosing [place] at [facility] to provide your oncology and hematology care.  To afford each patient quality time with our provider, please arrive at least 15 minutes before your scheduled appointment time.    You need to re-schedule your appointment should you arrive 10 or more minutes late.  We strive to give you quality time with our providers, and arriving late affects you and other patients whose appointments are after yours.  Also, if you no show three or more times for appointments you may be dismissed from the clinic at the providers discretion.     Again, thank you for choosing [facility].  Our hope is that these requests will decrease the amount of time that you wait before being seen by our physicians.       _____________________________________________________________  Should you have questions after your visit to [facility], please contact our office at [phone] between the hours of 8:30 a.m. and 4:30 p.m.  Voicemails left after 4:30 p.m. will not be returned until the following business day.  For prescription refill requests, have your pharmacy contact our office.

## 2015-08-08 NOTE — Patient Instructions (Signed)
[facility] Discharge Instructions for Patients Receiving Chemotherapy  Today you received the following chemotherapy agents:  Oxaliplatin, leucovorin, and 5FU. If you develop nausea and vomiting, or diarrhea that is not controlled by your medication, call the clinic.  The clinic phone number is [phone]. Office hours are Monday-Friday 8:30am-5:00pm.  BELOW ARE SYMPTOMS THAT SHOULD BE REPORTED IMMEDIATELY:  *FEVER GREATER THAN 101.0 F  *CHILLS WITH OR WITHOUT FEVER  NAUSEA AND VOMITING THAT IS NOT CONTROLLED WITH YOUR NAUSEA MEDICATION  *UNUSUAL SHORTNESS OF BREATH  *UNUSUAL BRUISING OR BLEEDING  TENDERNESS IN MOUTH AND THROAT WITH OR WITHOUT PRESENCE OF ULCERS  *URINARY PROBLEMS  *BOWEL PROBLEMS  UNUSUAL RASH Items with * indicate a potential emergency and should be followed up as soon as possible. If you have an emergency after office hours please contact your primary care physician or go to the nearest emergency department.  Please call the clinic during office hours if you have any questions or concerns.   You may also contact the Patient Navigator at [phone] should you have any questions or need assistance in obtaining follow up care. _____________________________________________________________________ Have you asked about our STAR program?    STAR stands for Survivorship Training and Rehabilitation, and this is a nationally recognized cancer care program that focuses on survivorship and rehabilitation.  Cancer and cancer treatments may cause problems, such as, pain, making you feel tired and keeping you from doing the things that you need or want to do. Cancer rehabilitation can help. Our goal is to reduce these troubling effects and help you have the best quality of life possible.  You may receive a survey from a nurse that asks questions about your current state of health.  Based on the survey results, all eligible patients will be referred to  the [facility] program for an evaluation so we can better serve you! A frequently asked questions sheet is available upon request.

## 2015-08-08 NOTE — Progress Notes (Signed)
See office visit encounter for assessment, VS, and further information.

## 2015-08-08 NOTE — Progress Notes (Signed)
Peter Bogus, MD [address] [place] [address]  Colon carcinoma  CURRENT THERAPY: S/P 5 cycles of FOLFOX  INTERVAL HISTORY: [name] 69 y.o. male returns for followup of Stage IIA adenocarcinoma of colon, S/P definitive surgery by Dr. Arnoldo Morale on 04/03/2015, and now undergoing adjuvant FOLFOX chemotherapy.     Colon carcinoma   03/28/2015 Initial Biopsy Diagnosis 1. Colon, biopsy, left descending - INVASIVE ADENOCARCINOMA. 2. Stomach, biopsy - GASTRIC BODY AND ANTRAL-TYPE MUCOSA WITH ASSOCIATED MINIMAL CHRONIC INFLAMMATION. - NO EVIDENCE OF HELICOBACTER PYLORI, INTESTINAL METAPLASIA, DYSPLASIA OR MA   03/29/2015 Imaging CT abd/pelvis- Circumferential mucosal lesion the proximal sigmoid colon consists with colorectal carcinoma.   04/03/2015 Definitive Surgery Colon, segmental resection for tumor - INVASIVE ADENOCARCINOMA, INVADING THROUGH THE MUSCULARIS PROPRIA INTO PERICOLONIC FATTY TISSUE. - FOURTEEN LYMPH NODES, NEGATIVE FOR METASTATIC CARCINOMA (0/14). - RESECTION MARGINS, NEGATIVE FOR ATYPIA OR MALIGNA   05/16/2015 -  Chemotherapy FOLFOX    05/31/2015 Treatment Plan Change Oxaliplatin dose reduced by 20% secondary to fatigue from cycle 1 of therapy.   07/24/2015 Treatment Plan Change 5 FU bolus D/C'd for cycle 5 and subsequent cycles due to thrombocytopenia.    I personally reviewed and went over laboratory results with the patient.  The results are noted within this dictation.  Patient was seen as a work-in last week with a left medial leg cellulitis.  He was started on Septra DS with instructions to report to the ED with fevers, chills, worsening of erythema, increased pain, etc.  It is noted that he reported to the ED on 8/6.  He underwent an I&D with expression of blood and discharge indicative of Abscess.  Keflex was added to his antibiotic regimen.  He cellulitis is much improved today.  Erythema is much improved as well.  He reports continued  tenderness but better.  He desires treatment today, but I discussed the risks associated with treatment associated with his improving cellulitis.  Counts came back today and he is appropriate for treatment based upon his CBC.  CMET is still pending at this time.     Past Medical History  Diagnosis Date  . Varicose veins   . New onset atrial fibrillation 01/02/2015  . Morbid obesity 01/02/2015  . Hypertension   . Dysrhythmia     Afib- on set 12/2014  . Sleep apnea     been tested but has not received the CPAP yet  . GERD (gastroesophageal reflux disease)   . History of gout   . Cancer     colon  . Family history of colon cancer   . Family history of breast cancer in mother     has Varicose veins of lower extremities with other complications; New onset atrial fibrillation; Morbid obesity; Melena; Heme positive stool; Esophageal dysphagia; Diarrhea; Occult blood in stools; Dysphagia, pharyngoesophageal phase; Colon carcinoma; Family history of colon cancer; Family history of breast cancer in mother; and Cellulitis on his problem list.     has No Known Allergies.  Current Outpatient Prescriptions on File Prior to Visit  Medication Sig Dispense Refill  . apixaban (ELIQUIS) 5 MG TABS tablet Take 5 mg by mouth daily.    . cephALEXin (KEFLEX) 500 MG capsule Take 1 capsule (500 mg total) by mouth 4 (four) times daily. 28 capsule 0  . dextrose 5 % SOLN 1,000 mL with fluorouracil 5 GM/100ML SOLN Inject into the vein every 14 (fourteen) days. To infuse over 46  hours. To be given in near future    . escitalopram (LEXAPRO) 20 MG tablet Take 1 tablet (20 mg total) by mouth daily. 30 tablet 2  . ibuprofen (ADVIL,MOTRIN) 200 MG tablet Take 400 mg by mouth every 6 (six) hours as needed for mild pain or moderate pain (for knee pain).    [name] LEUCOVORIN CALCIUM IV Inject into the vein every 14 (fourteen) days. To be started in near future    . lidocaine-prilocaine (EMLA) cream Apply a quarter size amount to  port site 1 hour prior to chemo. Do not rub in. Cover with plastic wrap. 30 g 3  . lisinopril (PRINIVIL,ZESTRIL) 10 MG tablet Take 1 tablet (10 mg total) by mouth daily. 30 tablet 12  . omeprazole (PRILOSEC) 20 MG capsule Take 1 capsule (20 mg total) by mouth daily. 30 capsule 3  . OXALIPLATIN IV Inject into the vein every 14 (fourteen) days. To start in near future    . sulfamethoxazole-trimethoprim (BACTRIM DS,SEPTRA DS) 800-160 MG per tablet Take 1 tablet by mouth 2 (two) times daily. 14 tablet 0  . HYDROcodone-acetaminophen (NORCO/VICODIN) 5-325 MG per tablet Take 1-2 tablets by mouth every 6 (six) hours as needed for moderate pain. (Patient not taking: Reported on 08/08/2015) 40 tablet 0  . ondansetron (ZOFRAN) 8 MG tablet Take 1 tablet (8 mg total) by mouth every 8 (eight) hours as needed. (Patient not taking: Reported on 08/08/2015) 30 tablet 3  . potassium chloride SA (K-DUR,KLOR-CON) 20 MEQ tablet Take 1 tablet (20 mEq total) by mouth 2 (two) times daily. (Patient not taking: Reported on 08/05/2015) 14 tablet 0  . prochlorperazine (COMPAZINE) 10 MG tablet Take 1 tablet (10 mg total) by mouth every 6 (six) hours as needed for nausea or vomiting. (Patient not taking: Reported on 08/08/2015) 30 tablet 3   No current facility-administered medications on file prior to visit.    Past Surgical History  Procedure Laterality Date  . Vein ligation and stripping      left leg  . Colonoscopy N/A 03/28/2015    Procedure: COLONOSCOPY;  Surgeon: [name], MD;  Location: [facility];  Service: Endoscopy;  Laterality: N/A;  . Esophagogastroduodenoscopy N/A 03/28/2015    Procedure: ESOPHAGOGASTRODUODENOSCOPY (EGD);  Surgeon: [name], MD;  Location: [facility];  Service: Endoscopy;  Laterality: N/A;  . Esophageal dilation N/A 03/28/2015    Procedure: ESOPHAGEAL DILATION;  Surgeon: [name], MD;  Location: [facility];  Service: Endoscopy;  Laterality: N/A;  . Partial colectomy N/A  04/03/2015    Procedure: PARTIAL COLECTOMY;  Surgeon: [name], MD;  Location: [facility];  Service: General;  Laterality: N/A;  . Portacath placement N/A 05/10/2015    Procedure: INSERTION PORT-A-CATH;  Surgeon: [name], MD;  Location: [facility];  Service: General;  Laterality: N/A;  left subclavian    Denies any headaches, dizziness, double vision, fevers, chills, night sweats, nausea, vomiting, diarrhea, constipation, chest pain, heart palpitations, shortness of breath, blood in stool, black tarry stool, urinary pain, urinary burning, urinary frequency, hematuria.   PHYSICAL EXAMINATION  ECOG PERFORMANCE STATUS: 0 - Asymptomatic  Filed Vitals:   08/08/15 0900  BP: 116/73  Pulse: 93  Temp: 97.7 F (36.5 C)  Resp: 18    GENERAL:alert, no distress, well developed, comfortable, cooperative, obese, smiling and unaccompanied in treatment room. SKIN: skin color, texture, turgor are normal, no rashes or significant lesions HEAD: Normocephalic, No masses, lesions, tenderness or abnormalities EYES: normal, PERRLA,  EOMI, Conjunctiva are pink and non-injected EARS: External ears normal OROPHARYNX:lips, buccal mucosa, and tongue normal and mucous membranes are moist  NECK: supple, trachea midline LYMPH:  not examined BREAST:not examined LUNGS: clear to auscultation  HEART: irregularly irregular ABDOMEN:abdomen soft, non-tender, obese and normal bowel sounds BACK: Back symmetric, no curvature. EXTREMITIES:less then 2 second capillary refill, no joint deformities, effusion, or inflammation, no cyanosis, positive findings:  Left medial leg erythema that is less erythematous compared to last week with continued thickened skin within the area and induration.  Transverse healing laceration noted from past I&D on 8/6 in the ED.  NEURO: alert & oriented x 3 with fluent speech, no focal motor/sensory deficits, gait normal    LABORATORY DATA: CBC    Component Value Date/Time   WBC 7.0  08/08/2015 0955   RBC 3.81* 08/08/2015 0955   HGB 12.7* 08/08/2015 0955   HCT 37.7* 08/08/2015 0955   PLT 109* 08/08/2015 0955   MCV 99.0 08/08/2015 0955   MCH 33.3 08/08/2015 0955   MCHC 33.7 08/08/2015 0955   RDW 17.2* 08/08/2015 0955   LYMPHSABS 1.3 08/08/2015 0955   MONOABS 0.6 08/08/2015 0955   EOSABS 0.1 08/08/2015 0955   BASOSABS 0.0 08/08/2015 0955      Chemistry      Component Value Date/Time   NA 135 08/08/2015 0955   K 4.1 08/08/2015 0955   CL 104 08/08/2015 0955   CO2 23 08/08/2015 0955   BUN 7 08/08/2015 0955   CREATININE 0.99 08/08/2015 0955      Component Value Date/Time   CALCIUM 8.9 08/08/2015 0955   ALKPHOS 135* 08/08/2015 0955   AST 45* 08/08/2015 0955   ALT 23 08/08/2015 0955   BILITOT 1.4* 08/08/2015 0955        PENDING LABS:   RADIOGRAPHIC STUDIES:  No results found.   PATHOLOGY:    ASSESSMENT AND PLAN:  Colon carcinoma Stage IIA adenocarcinoma of colon, S/P definitive surgery by Dr. Arnoldo Morale on 04/03/2015, and now undergoing adjuvant FOLFOX chemotherapy for high risk factors.  Treatment will provide an 8% absolute survival advantage and the patient is interested in pursuing systemic chemotherapy.  Oxaliplatin dose is reduced by 20% beginning on cycle #2.  Pre-chemo labs today: CBC diff, CMET   Chart reviewed.  ED visit noted.  Currently on antibiotics for cellulitis.  He is advised to complete his course of antibiotics.  Treatment today as planned since cellulitis is much improved and he is healing nicely from I&D.  His healing is far enough along that chemotherapy-induced impaired healing is unlikely to interfere with healing process at this time.  He will call with any issues in the interim.  Return in 1 weeks for follow-up of tolerance to chemotherapy and to see how his leg is coming along.  Return in 2 weeks for follow-up and treatment.    THERAPY PLAN:  Continue with treatment as planned.  All questions were answered. The  patient knows to call the clinic with any problems, questions or concerns. We can certainly see the patient much sooner if necessary.  Patient and plan discussed with Dr. Ancil Linsey and she is in agreement with the aforementioned.   This note is electronically signed by: Doy Mince 08/08/2015 10:45 AM

## 2015-08-08 NOTE — Assessment & Plan Note (Addendum)
Stage IIA adenocarcinoma of colon, S/P definitive surgery by Dr. Arnoldo Morale on 04/03/2015, and now undergoing adjuvant FOLFOX chemotherapy for high risk factors.  Treatment will provide an 8% absolute survival advantage and the patient is interested in pursuing systemic chemotherapy.  Oxaliplatin dose is reduced by 20% beginning on cycle #2.  Pre-chemo labs today: CBC diff, CMET   Chart reviewed.  ED visit noted.  Currently on antibiotics for cellulitis.  He is advised to complete his course of antibiotics.  Treatment today as planned since cellulitis is much improved and he is healing nicely from I&D.  His healing is far enough along that chemotherapy-induced impaired healing is unlikely to interfere with healing process at this time.  He will call with any issues in the interim.  Return in 1 weeks for follow-up of tolerance to chemotherapy and to see how his leg is coming along.  Return in 2 weeks for follow-up and treatment.

## 2015-08-10 ENCOUNTER — Ambulatory Visit (HOSPITAL_COMMUNITY): Payer: Medicare Other | Admitting: Physical Therapy

## 2015-08-10 ENCOUNTER — Encounter (HOSPITAL_BASED_OUTPATIENT_CLINIC_OR_DEPARTMENT_OTHER): Payer: Medicare Other

## 2015-08-10 VITALS — BP 104/64 | HR 85 | Temp 97.8°F | Resp 22

## 2015-08-10 DIAGNOSIS — C186 Malignant neoplasm of descending colon: Secondary | ICD-10-CM

## 2015-08-10 DIAGNOSIS — C189 Malignant neoplasm of colon, unspecified: Secondary | ICD-10-CM

## 2015-08-10 DIAGNOSIS — Z5189 Encounter for other specified aftercare: Secondary | ICD-10-CM | POA: Diagnosis not present

## 2015-08-10 MED ORDER — SODIUM CHLORIDE 0.9 % IJ SOLN
10.0000 mL | INTRAMUSCULAR | Status: DC | PRN
  Administered 2015-08-10: 10 mL
  Filled 2015-08-10: qty 10

## 2015-08-10 MED ORDER — PEGFILGRASTIM 6 MG/0.6ML ~~LOC~~ PSKT
6.0000 mg | PREFILLED_SYRINGE | Freq: Once | SUBCUTANEOUS | Status: AC
  Administered 2015-08-10: 6 mg via SUBCUTANEOUS
  Filled 2015-08-10: qty 0.6

## 2015-08-10 MED ORDER — HEPARIN SOD (PORK) LOCK FLUSH 100 UNIT/ML IV SOLN
INTRAVENOUS | Status: AC
  Filled 2015-08-10: qty 5

## 2015-08-10 MED ORDER — HEPARIN SOD (PORK) LOCK FLUSH 100 UNIT/ML IV SOLN
500.0000 [IU] | Freq: Once | INTRAVENOUS | Status: AC | PRN
  Administered 2015-08-10: 500 [IU]

## 2015-08-10 NOTE — Patient Instructions (Signed)
[place] at [facility] Discharge Instructions  RECOMMENDATIONS MADE BY THE CONSULTANT AND ANY TEST RESULTS WILL BE SENT TO YOUR REFERRING PHYSICIAN.  Pump discontinued. Port flush per protocol. Neulasta Onpro Body Injector attached left upper arm. You may safely remove injector at 6 pm tomorrow.  Return as scheduled.  Thank you for choosing [place] at [facility] to provide your oncology and hematology care.  To afford each patient quality time with our provider, please arrive at least 15 minutes before your scheduled appointment time.    You need to re-schedule your appointment should you arrive 10 or more minutes late.  We strive to give you quality time with our providers, and arriving late affects you and other patients whose appointments are after yours.  Also, if you no show three or more times for appointments you may be dismissed from the clinic at the providers discretion.     Again, thank you for choosing [facility].  Our hope is that these requests will decrease the amount of time that you wait before being seen by our physicians.       _____________________________________________________________  Should you have questions after your visit to [facility], please contact our office at [phone] between the hours of 8:30 a.m. and 4:30 p.m.  Voicemails left after 4:30 p.m. will not be returned until the following business day.  For prescription refill requests, have your pharmacy contact our office.

## 2015-08-10 NOTE — Progress Notes (Signed)
Tolerated chemo well. Denies any complaints. Discontinued infusion pump. Flushed port per protocol. De-accessed port needle. [name] presents today for injection per MD orders. Neulasta 6mg  Onpro Body Injector administered on left Upper Arm. Administration without incident. Patient tolerated well.

## 2015-08-16 HISTORY — PX: SKIN CANCER DESTRUCTION: SHX778

## 2015-08-17 ENCOUNTER — Encounter (HOSPITAL_COMMUNITY): Payer: Self-pay | Admitting: Oncology

## 2015-08-17 ENCOUNTER — Encounter (HOSPITAL_BASED_OUTPATIENT_CLINIC_OR_DEPARTMENT_OTHER): Payer: Medicare Other | Admitting: Oncology

## 2015-08-17 ENCOUNTER — Other Ambulatory Visit (HOSPITAL_COMMUNITY): Payer: Self-pay | Admitting: Respiratory Therapy

## 2015-08-17 VITALS — BP 75/45 | HR 65 | Temp 97.8°F | Resp 18 | Wt 256.3 lb

## 2015-08-17 DIAGNOSIS — C186 Malignant neoplasm of descending colon: Secondary | ICD-10-CM | POA: Diagnosis present

## 2015-08-17 DIAGNOSIS — R110 Nausea: Secondary | ICD-10-CM | POA: Diagnosis not present

## 2015-08-17 DIAGNOSIS — C189 Malignant neoplasm of colon, unspecified: Secondary | ICD-10-CM

## 2015-08-17 DIAGNOSIS — D696 Thrombocytopenia, unspecified: Secondary | ICD-10-CM | POA: Diagnosis not present

## 2015-08-17 NOTE — Patient Instructions (Signed)
..  [place] at [facility] Discharge Instructions  RECOMMENDATIONS MADE BY THE CONSULTANT AND ANY TEST RESULTS WILL BE SENT TO YOUR REFERRING PHYSICIAN.  Seen and discussion with Robynn Pane  PA-C. Call the cancer center with any question and/or concerns that you have.  Return as scheduled.    Thank you for choosing [place] at [facility] to provide your oncology and hematology care.  To afford each patient quality time with our provider, please arrive at least 15 minutes before your scheduled appointment time.    You need to re-schedule your appointment should you arrive 10 or more minutes late.  We strive to give you quality time with our providers, and arriving late affects you and other patients whose appointments are after yours.  Also, if you no show three or more times for appointments you may be dismissed from the clinic at the providers discretion.     Again, thank you for choosing [facility].  Our hope is that these requests will decrease the amount of time that you wait before being seen by our physicians.       _____________________________________________________________  Should you have questions after your visit to [facility], please contact our office at [phone] between the hours of 8:30 a.m. and 4:30 p.m.  Voicemails left after 4:30 p.m. will not be returned until the following business day.  For prescription refill requests, have your pharmacy contact our office.

## 2015-08-17 NOTE — Assessment & Plan Note (Signed)
Interval follow-up due to systemic treatment in the setting of resolving cellulitis of left medial leg.  Erythema is resolved.  Thickened skin at the site of I&D is noted.  No signs or symptoms of infection.  He notes increased nausea with meals.  He is advised to take an antiemetic 30-60 minutes prior to meals in an attempt to abort this symptom.  He reports increased, intermittent lacrimation.  He denies any thick discharge or pruritis.  Sclera are white.  5FU bolus previously D/C'd for thrombocytopenia.  I do not believe a dose reduction in 5FU CI is indicated at this time.  I have asked him to mention this issue to Dr. Whitney Muse in follow-up next week and will defer any treatment alteration to her if necessary.

## 2015-08-17 NOTE — Progress Notes (Signed)
Peter Bogus, MD [address] [place] [address]  Colon carcinoma  CURRENT THERAPY: S/P 6 cycles of FOLFOX  INTERVAL HISTORY: CAS TRACZ 69 y.o. male returns for followup of Stage IIA adenocarcinoma of colon, S/P definitive surgery by Dr. Arnoldo Morale on 04/03/2015, and now undergoing adjuvant FOLFOX chemotherapy.     Colon carcinoma   03/28/2015 Initial Biopsy Diagnosis 1. Colon, biopsy, left descending - INVASIVE ADENOCARCINOMA. 2. Stomach, biopsy - GASTRIC BODY AND ANTRAL-TYPE MUCOSA WITH ASSOCIATED MINIMAL CHRONIC INFLAMMATION. - NO EVIDENCE OF HELICOBACTER PYLORI, INTESTINAL METAPLASIA, DYSPLASIA OR MA   03/29/2015 Imaging CT abd/pelvis- Circumferential mucosal lesion the proximal sigmoid colon consists with colorectal carcinoma.   04/03/2015 Definitive Surgery Colon, segmental resection for tumor - INVASIVE ADENOCARCINOMA, INVADING THROUGH THE MUSCULARIS PROPRIA INTO PERICOLONIC FATTY TISSUE. - FOURTEEN LYMPH NODES, NEGATIVE FOR METASTATIC CARCINOMA (0/14). - RESECTION MARGINS, NEGATIVE FOR ATYPIA OR MALIGNA   05/16/2015 -  Chemotherapy FOLFOX    05/31/2015 Treatment Plan Change Oxaliplatin dose reduced by 20% secondary to fatigue from cycle 1 of therapy.   07/24/2015 Treatment Plan Change 5 FU bolus D/C'd for cycle 5 and subsequent cycles due to thrombocytopenia.   Today is a check-up due to treatment last week in the setting of resolving cellulitis.  His cellulitis is resolved.  There is some thickening of the skin at the site of the I&D.    He notes some nausea with meals and nursing provided advice regarding this issue.  HE is educated to take antiemetic 30-60 minutes prior to meals to abort this issue.  He reports increased lacrimation that is intermittent. This could be 5FU-induced but there is unlikely a need for dose reduction in this setting.  5 FU bolus previously discontinued for unrelated toxicity/effect.  He underwent left inferior to  orbit skin lesion resection by dermatology, Dr. Dayton Martes, for malignancy.  He has follow-up in the near future.   Past Medical History  Diagnosis Date  . Varicose veins   . New onset atrial fibrillation 01/02/2015  . Morbid obesity 01/02/2015  . Hypertension   . Dysrhythmia     Afib- on set 12/2014  . Sleep apnea     been tested but has not received the CPAP yet  . GERD (gastroesophageal reflux disease)   . History of gout   . Cancer     colon  . Family history of colon cancer   . Family history of breast cancer in mother     has Varicose veins of lower extremities with other complications; New onset atrial fibrillation; Morbid obesity; Melena; Heme positive stool; Esophageal dysphagia; Diarrhea; Occult blood in stools; Dysphagia, pharyngoesophageal phase; Colon carcinoma; Family history of colon cancer; Family history of breast cancer in mother; and Cellulitis on his problem list.     has No Known Allergies.  Current Outpatient Prescriptions on File Prior to Visit  Medication Sig Dispense Refill  . apixaban (ELIQUIS) 5 MG TABS tablet Take 5 mg by mouth daily.    . cephALEXin (KEFLEX) 500 MG capsule Take 1 capsule (500 mg total) by mouth 4 (four) times daily. 28 capsule 0  . escitalopram (LEXAPRO) 20 MG tablet Take 1 tablet (20 mg total) by mouth daily. 30 tablet 2  . lidocaine-prilocaine (EMLA) cream Apply a quarter size amount to port site 1 hour prior to chemo. Do not rub in. Cover with plastic wrap. 30 g 3  . lisinopril (PRINIVIL,ZESTRIL) 10 MG tablet Take 1  tablet (10 mg total) by mouth daily. 30 tablet 12  . omeprazole (PRILOSEC) 20 MG capsule Take 1 capsule (20 mg total) by mouth daily. 30 capsule 3  . ondansetron (ZOFRAN) 8 MG tablet Take 1 tablet (8 mg total) by mouth every 8 (eight) hours as needed. 30 tablet 3  . dextrose 5 % SOLN 1,000 mL with fluorouracil 5 GM/100ML SOLN Inject into the vein every 14 (fourteen) days. To infuse over 46 hours. To be given in near future    .  HYDROcodone-acetaminophen (NORCO/VICODIN) 5-325 MG per tablet Take 1-2 tablets by mouth every 6 (six) hours as needed for moderate pain. (Patient not taking: Reported on 08/08/2015) 40 tablet 0  . ibuprofen (ADVIL,MOTRIN) 200 MG tablet Take 400 mg by mouth every 6 (six) hours as needed for mild pain or moderate pain (for knee pain).    [name] LEUCOVORIN CALCIUM IV Inject into the vein every 14 (fourteen) days. To be started in near future    . OXALIPLATIN IV Inject into the vein every 14 (fourteen) days. To start in near future    . potassium chloride SA (K-DUR,KLOR-CON) 20 MEQ tablet Take 1 tablet (20 mEq total) by mouth 2 (two) times daily. (Patient not taking: Reported on 08/05/2015) 14 tablet 0  . prochlorperazine (COMPAZINE) 10 MG tablet Take 1 tablet (10 mg total) by mouth every 6 (six) hours as needed for nausea or vomiting. (Patient not taking: Reported on 08/08/2015) 30 tablet 3  . sulfamethoxazole-trimethoprim (BACTRIM DS,SEPTRA DS) 800-160 MG per tablet Take 1 tablet by mouth 2 (two) times daily. (Patient not taking: Reported on 08/17/2015) 14 tablet 0   No current facility-administered medications on file prior to visit.    Past Surgical History  Procedure Laterality Date  . Vein ligation and stripping      left leg  . Colonoscopy N/A 03/28/2015    Procedure: COLONOSCOPY;  Surgeon: [name], MD;  [facility];  Service: Endoscopy;  Laterality: N/A;  . Esophagogastroduodenoscopy N/A 03/28/2015    Procedure: ESOPHAGOGASTRODUODENOSCOPY (EGD);  Surgeon: [name], MD;  [facility];  Service: Endoscopy;  Laterality: N/A;  . Esophageal dilation N/A 03/28/2015    Procedure: ESOPHAGEAL DILATION;  Surgeon: [name], MD;  [facility];  Service: Endoscopy;  Laterality: N/A;  . Partial colectomy N/A 04/03/2015    Procedure: PARTIAL COLECTOMY;  Surgeon: [name], MD;  Location: [facility];  Service: General;  Laterality: N/A;  . Portacath placement N/A  05/10/2015    Procedure: INSERTION PORT-A-CATH;  Surgeon: [name], MD;  Location: [facility];  Service: General;  Laterality: N/A;  left subclavian  . Skin cancer destruction Left 08/16/15    left side of nose and left back    Denies any headaches, dizziness, double vision, fevers, chills, night sweats, nausea, vomiting, diarrhea, constipation, chest pain, heart palpitations, shortness of breath, blood in stool, black tarry stool, urinary pain, urinary burning, urinary frequency, hematuria.   PHYSICAL EXAMINATION  ECOG PERFORMANCE STATUS: 0 - Asymptomatic  Filed Vitals:   08/17/15 1100  BP: 75/45  Pulse: 65  Temp: 97.8 F (36.6 C)  Resp: 18    GENERAL:alert, no distress, well developed, comfortable, cooperative, obese, smiling and unaccompanied in treatment room. SKIN: skin color, texture, turgor are normal, no rashes or significant lesions HEAD: Normocephalic, No masses, lesions, tenderness or abnormalities EYES: normal, PERRLA, EOMI, Conjunctiva are pink and non-injected EARS: External ears normal OROPHARYNX:lips, buccal mucosa, and tongue  normal and mucous membranes are moist  NECK: supple, trachea midline LYMPH:  not examined BREAST:not examined LUNGS: clear to auscultation  HEART: irregularly irregular ABDOMEN:abdomen soft, non-tender, obese and normal bowel sounds BACK: Back symmetric, no curvature. EXTREMITIES:less then 2 second capillary refill, no joint deformities, effusion, or inflammation, no cyanosis, positive findings:  Left medial leg erythema is resolved with continued thickened skin with induration at the site of I&D on 8/6 in the ED.  NEURO: alert & oriented x 3 with fluent speech, no focal motor/sensory deficits, gait normal    LABORATORY DATA: CBC    Component Value Date/Time   WBC 7.0 08/08/2015 0955   RBC 3.81* 08/08/2015 0955   HGB 12.7* 08/08/2015 0955   HCT 37.7* 08/08/2015 0955   PLT 109* 08/08/2015 0955   MCV 99.0 08/08/2015 0955   MCH  33.3 08/08/2015 0955   MCHC 33.7 08/08/2015 0955   RDW 17.2* 08/08/2015 0955   LYMPHSABS 1.3 08/08/2015 0955   MONOABS 0.6 08/08/2015 0955   EOSABS 0.1 08/08/2015 0955   BASOSABS 0.0 08/08/2015 0955      Chemistry      Component Value Date/Time   NA 135 08/08/2015 0955   K 4.1 08/08/2015 0955   CL 104 08/08/2015 0955   CO2 23 08/08/2015 0955   BUN 7 08/08/2015 0955   CREATININE 0.99 08/08/2015 0955      Component Value Date/Time   CALCIUM 8.9 08/08/2015 0955   ALKPHOS 135* 08/08/2015 0955   AST 45* 08/08/2015 0955   ALT 23 08/08/2015 0955   BILITOT 1.4* 08/08/2015 0955        PENDING LABS:   RADIOGRAPHIC STUDIES:  No results found.   PATHOLOGY:    ASSESSMENT AND PLAN:  Colon carcinoma Interval follow-up due to systemic treatment in the setting of resolving cellulitis of left medial leg.  Erythema is resolved.  Thickened skin at the site of I&D is noted.  No signs or symptoms of infection.  He notes increased nausea with meals.  He is advised to take an antiemetic 30-60 minutes prior to meals in an attempt to abort this symptom.  He reports increased, intermittent lacrimation.  He denies any thick discharge or pruritis.  Sclera are white.  5FU bolus previously D/C'd for thrombocytopenia.  I do not believe a dose reduction in 5FU CI is indicated at this time.  I have asked him to mention this issue to Dr. Whitney Muse in follow-up next week and will defer any treatment alteration to her if necessary.    THERAPY PLAN:  Continue with treatment as planned.  All questions were answered. The patient knows to call the clinic with any problems, questions or concerns. We can certainly see the patient much sooner if necessary.  Patient and plan discussed with Dr. Ancil Linsey and she is in agreement with the aforementioned.   This note is electronically signed by: Doy Mince 08/17/2015 11:51 AM

## 2015-08-22 ENCOUNTER — Encounter (HOSPITAL_COMMUNITY): Payer: Self-pay | Admitting: Hematology & Oncology

## 2015-08-22 ENCOUNTER — Encounter (HOSPITAL_BASED_OUTPATIENT_CLINIC_OR_DEPARTMENT_OTHER): Payer: Medicare Other

## 2015-08-22 ENCOUNTER — Encounter (HOSPITAL_BASED_OUTPATIENT_CLINIC_OR_DEPARTMENT_OTHER): Payer: Medicare Other | Admitting: Hematology & Oncology

## 2015-08-22 ENCOUNTER — Telehealth (HOSPITAL_COMMUNITY): Payer: Self-pay

## 2015-08-22 VITALS — BP 126/82 | HR 92 | Temp 97.5°F | Resp 20 | Wt 259.0 lb

## 2015-08-22 VITALS — BP 145/82 | HR 87 | Temp 97.6°F | Resp 18

## 2015-08-22 DIAGNOSIS — R432 Parageusia: Secondary | ICD-10-CM

## 2015-08-22 DIAGNOSIS — R530 Neoplastic (malignant) related fatigue: Secondary | ICD-10-CM | POA: Diagnosis not present

## 2015-08-22 DIAGNOSIS — L989 Disorder of the skin and subcutaneous tissue, unspecified: Secondary | ICD-10-CM | POA: Diagnosis not present

## 2015-08-22 DIAGNOSIS — R740 Nonspecific elevation of levels of transaminase and lactic acid dehydrogenase [LDH]: Secondary | ICD-10-CM

## 2015-08-22 DIAGNOSIS — C186 Malignant neoplasm of descending colon: Secondary | ICD-10-CM | POA: Diagnosis present

## 2015-08-22 DIAGNOSIS — Z5111 Encounter for antineoplastic chemotherapy: Secondary | ICD-10-CM

## 2015-08-22 DIAGNOSIS — Z800 Family history of malignant neoplasm of digestive organs: Secondary | ICD-10-CM | POA: Diagnosis not present

## 2015-08-22 DIAGNOSIS — C189 Malignant neoplasm of colon, unspecified: Secondary | ICD-10-CM

## 2015-08-22 DIAGNOSIS — C187 Malignant neoplasm of sigmoid colon: Secondary | ICD-10-CM | POA: Diagnosis not present

## 2015-08-22 DIAGNOSIS — F4321 Adjustment disorder with depressed mood: Secondary | ICD-10-CM

## 2015-08-22 LAB — CBC WITH DIFFERENTIAL/PLATELET
BASOS ABS: 0 10*3/uL (ref 0.0–0.1)
BASOS PCT: 0 % (ref 0–1)
Eosinophils Absolute: 0.2 10*3/uL (ref 0.0–0.7)
Eosinophils Relative: 2 % (ref 0–5)
HEMATOCRIT: 36.7 % — AB (ref 39.0–52.0)
Hemoglobin: 12.5 g/dL — ABNORMAL LOW (ref 13.0–17.0)
Lymphocytes Relative: 15 % (ref 12–46)
Lymphs Abs: 1.3 10*3/uL (ref 0.7–4.0)
MCH: 34.3 pg — ABNORMAL HIGH (ref 26.0–34.0)
MCHC: 34.1 g/dL (ref 30.0–36.0)
MCV: 100.8 fL — ABNORMAL HIGH (ref 78.0–100.0)
Monocytes Absolute: 1 10*3/uL (ref 0.1–1.0)
Monocytes Relative: 12 % (ref 3–12)
NEUTROS ABS: 5.7 10*3/uL (ref 1.7–7.7)
NEUTROS PCT: 70 % (ref 43–77)
Platelets: 98 10*3/uL — ABNORMAL LOW (ref 150–400)
RBC: 3.64 MIL/uL — AB (ref 4.22–5.81)
RDW: 17 % — AB (ref 11.5–15.5)
WBC: 8.2 10*3/uL (ref 4.0–10.5)

## 2015-08-22 LAB — COMPREHENSIVE METABOLIC PANEL
ALBUMIN: 2.7 g/dL — AB (ref 3.5–5.0)
ALT: 55 U/L (ref 17–63)
AST: 77 U/L — AB (ref 15–41)
Alkaline Phosphatase: 138 U/L — ABNORMAL HIGH (ref 38–126)
Anion gap: 7 (ref 5–15)
BILIRUBIN TOTAL: 1.2 mg/dL (ref 0.3–1.2)
BUN: 12 mg/dL (ref 6–20)
CHLORIDE: 103 mmol/L (ref 101–111)
CO2: 24 mmol/L (ref 22–32)
Calcium: 8.5 mg/dL — ABNORMAL LOW (ref 8.9–10.3)
Creatinine, Ser: 0.81 mg/dL (ref 0.61–1.24)
GFR calc Af Amer: >60 mL/min (ref >60)
GFR calc non Af Amer: >60 mL/min (ref >60)
GLUCOSE: 143 mg/dL — AB (ref 65–99)
Potassium: 4.1 mmol/L (ref 3.5–5.1)
Sodium: 134 mmol/L — ABNORMAL LOW (ref 135–145)
TOTAL PROTEIN: 6.2 g/dL — AB (ref 6.5–8.1)

## 2015-08-22 MED ORDER — SODIUM CHLORIDE 0.9 % IV SOLN
Freq: Once | INTRAVENOUS | Status: AC
  Administered 2015-08-22: 10:00:00 via INTRAVENOUS
  Filled 2015-08-22: qty 4

## 2015-08-22 MED ORDER — HEPARIN SOD (PORK) LOCK FLUSH 100 UNIT/ML IV SOLN
500.0000 [IU] | Freq: Once | INTRAVENOUS | Status: DC | PRN
Start: 2015-08-22 — End: 2015-08-22

## 2015-08-22 MED ORDER — SODIUM CHLORIDE 0.9 % IJ SOLN: 10.0000 mL | INTRAMUSCULAR | Status: DC | PRN

## 2015-08-22 MED ORDER — OXALIPLATIN CHEMO INJECTION 100 MG/20ML
68.0000 mg/m2 | Freq: Once | INTRAVENOUS | Status: AC
  Administered 2015-08-22: 165 mg via INTRAVENOUS
  Filled 2015-08-22: qty 33

## 2015-08-22 MED ORDER — DEXTROSE 5 % IV SOLN
400.0000 mg/m2 | Freq: Once | INTRAVENOUS | Status: AC
  Administered 2015-08-22: 972 mg via INTRAVENOUS
  Filled 2015-08-22: qty 48.6

## 2015-08-22 MED ORDER — SODIUM CHLORIDE 0.9 % IV SOLN
2040.0000 mg/m2 | INTRAVENOUS | Status: DC
  Administered 2015-08-22: 4950 mg via INTRAVENOUS
  Filled 2015-08-22: qty 99

## 2015-08-22 MED ORDER — CIPROFLOXACIN HCL 0.3 % OP SOLN: OPHTHALMIC | Status: DC

## 2015-08-22 MED ORDER — DEXTROSE 5 % IV SOLN
Freq: Once | INTRAVENOUS | Status: AC
  Administered 2015-08-22: 10:00:00 via INTRAVENOUS

## 2015-08-22 NOTE — Telephone Encounter (Signed)
Patient notified and verbalized understanding of instructions. 

## 2015-08-22 NOTE — Progress Notes (Signed)
Dr Whitney Muse notified of platelets 98,000 and AST 77.  Ok to treat pt.  Darrick Penna Bonawitz Tolerated chemotherapy well today Discharged ambulatory with pump

## 2015-08-22 NOTE — Telephone Encounter (Signed)
-----   Message from [name], MD sent at 08/22/2015  4:50 PM EDT -----  Advise patient I called in cipro eye drops for L eye. Dr.P

## 2015-08-22 NOTE — Progress Notes (Signed)
Peter Bogus, MD [name] [place] [address]  Stage IIA adenocarcinoma of the colon  CURRENT THERAPY: Adjuvant FOLFOX   INTERVAL HISTORY: [name] 69 y.o. male returns for followup of Stage IIA adenocarcinoma of colon, S/P definitive surgery by Dr. Arnoldo Morale on 04/03/2015, and now undergoing adjuvant FOLFOX chemotherapy.   The patient has had skin cancer removed from the left side of his face that is healing.  His appetite is not well but he eats on a regular basis. He notes his biggest problem is taste alteration.  He has overall had a normal mood.  He still has not been driving and his wife states that this is because his distance seems to be off.  He has not seen his friends lately however he has been active in other ways.  He is feeling positive about finishing chemotherapy.  He denies nausea/vomiting or worsening neuropathy.    Colon carcinoma   03/28/2015 Initial Biopsy Diagnosis 1. Colon, biopsy, left descending - INVASIVE ADENOCARCINOMA. 2. Stomach, biopsy - GASTRIC BODY AND ANTRAL-TYPE MUCOSA WITH ASSOCIATED MINIMAL CHRONIC INFLAMMATION. - NO EVIDENCE OF HELICOBACTER PYLORI, INTESTINAL METAPLASIA, DYSPLASIA OR MA   03/29/2015 Imaging CT abd/pelvis- Circumferential mucosal lesion the proximal sigmoid colon consists with colorectal carcinoma.   04/03/2015 Definitive Surgery Colon, segmental resection for tumor - INVASIVE ADENOCARCINOMA, INVADING THROUGH THE MUSCULARIS PROPRIA INTO PERICOLONIC FATTY TISSUE. - FOURTEEN LYMPH NODES, NEGATIVE FOR METASTATIC CARCINOMA (0/14). - RESECTION MARGINS, NEGATIVE FOR ATYPIA OR MALIGNA   05/16/2015 -  Chemotherapy FOLFOX    05/31/2015 Treatment Plan Change Oxaliplatin dose reduced by 20% secondary to fatigue from cycle 1 of therapy.   07/24/2015 Treatment Plan Change 5 FU bolus D/C'd for cycle 5 and subsequent cycles due to thrombocytopenia.     Past Medical History  Diagnosis Date  . Varicose veins   .  New onset atrial fibrillation 01/02/2015  . Morbid obesity 01/02/2015  . Hypertension   . Dysrhythmia     Afib- on set 12/2014  . Sleep apnea     been tested but has not received the CPAP yet  . GERD (gastroesophageal reflux disease)   . History of gout   . Cancer     colon  . Family history of colon cancer   . Family history of breast cancer in mother     has Varicose veins of lower extremities with other complications; New onset atrial fibrillation; Morbid obesity; Melena; Heme positive stool; Esophageal dysphagia; Diarrhea; Occult blood in stools; Dysphagia, pharyngoesophageal phase; Colon carcinoma; Family history of colon cancer; Family history of breast cancer in mother; and Cellulitis on his problem list.   He believes that his sister died of heart issues, she died in her sleep.   has No Known Allergies.  Current Outpatient Prescriptions on File Prior to Visit  Medication Sig Dispense Refill  . apixaban (ELIQUIS) 5 MG TABS tablet Take 5 mg by mouth daily.    [name] dextrose 5 % SOLN 1,000 mL with fluorouracil 5 GM/100ML SOLN Inject into the vein every 14 (fourteen) days. To infuse over 46 hours. To be given in near future    . escitalopram (LEXAPRO) 20 MG tablet Take 1 tablet (20 mg total) by mouth daily. 30 tablet 2  . LEUCOVORIN CALCIUM IV Inject into the vein every 14 (fourteen) days. To be started in near future    . lidocaine-prilocaine (EMLA) cream Apply a quarter size amount to port site 1  hour prior to chemo. Do not rub in. Cover with plastic wrap. 30 g 3  . lisinopril (PRINIVIL,ZESTRIL) 10 MG tablet Take 1 tablet (10 mg total) by mouth daily. 30 tablet 12  . omeprazole (PRILOSEC) 20 MG capsule Take 1 capsule (20 mg total) by mouth daily. 30 capsule 3  . ondansetron (ZOFRAN) 8 MG tablet Take 1 tablet (8 mg total) by mouth every 8 (eight) hours as needed. 30 tablet 3  . OXALIPLATIN IV Inject into the vein every 14 (fourteen) days. To start in near future    . prochlorperazine  (COMPAZINE) 10 MG tablet Take 1 tablet (10 mg total) by mouth every 6 (six) hours as needed for nausea or vomiting. 30 tablet 3  . cephALEXin (KEFLEX) 500 MG capsule Take 1 capsule (500 mg total) by mouth 4 (four) times daily. (Patient not taking: Reported on 08/22/2015) 28 capsule 0  . HYDROcodone-acetaminophen (NORCO/VICODIN) 5-325 MG per tablet Take 1-2 tablets by mouth every 6 (six) hours as needed for moderate pain. (Patient not taking: Reported on 08/22/2015) 40 tablet 0  . ibuprofen (ADVIL,MOTRIN) 200 MG tablet Take 400 mg by mouth every 6 (six) hours as needed for mild pain or moderate pain (for knee pain).    . potassium chloride SA (K-DUR,KLOR-CON) 20 MEQ tablet Take 1 tablet (20 mEq total) by mouth 2 (two) times daily. (Patient not taking: Reported on 08/05/2015) 14 tablet 0  . sulfamethoxazole-trimethoprim (BACTRIM DS,SEPTRA DS) 800-160 MG per tablet Take 1 tablet by mouth 2 (two) times daily. (Patient not taking: Reported on 08/17/2015) 14 tablet 0   Current Facility-Administered Medications on File Prior to Visit  Medication Dose Route Frequency Provider Last Rate Last Dose  . fluorouracil (ADRUCIL) 4,950 mg in sodium chloride 0.9 % 51 mL chemo infusion  2,040 mg/m2 (Treatment Plan Actual) Intravenous 1 day or 1 dose [name], MD   4,950 mg at 08/22/15 1309  . heparin lock flush 100 unit/mL  500 Units Intracatheter Once PRN [name], MD      . sodium chloride 0.9 % injection 10 mL  10 mL Intracatheter PRN [name], MD        Past Surgical History  Procedure Laterality Date  . Vein ligation and stripping      left leg  . Colonoscopy N/A 03/28/2015    Procedure: COLONOSCOPY;  Surgeon: [name], MD;  [facility];  Service: Endoscopy;  Laterality: N/A;  . Esophagogastroduodenoscopy N/A 03/28/2015    Procedure: ESOPHAGOGASTRODUODENOSCOPY (EGD);  Surgeon: [name], MD;  [facility];  Service: Endoscopy;  Laterality: N/A;  .  Esophageal dilation N/A 03/28/2015    Procedure: ESOPHAGEAL DILATION;  Surgeon: [name], MD;  [facility];  Service: Endoscopy;  Laterality: N/A;  . Partial colectomy N/A 04/03/2015    Procedure: PARTIAL COLECTOMY;  Surgeon: [name], MD;  Location: [facility];  Service: General;  Laterality: N/A;  . Portacath placement N/A 05/10/2015    Procedure: INSERTION PORT-A-CATH;  Surgeon: [name], MD;  Location: [facility];  Service: General;  Laterality: N/A;  left subclavian  . Skin cancer destruction Left 08/16/15    left side of nose and left back    Denies any headaches, dizziness, double vision, fevers, chills, night sweats, nausea, vomiting, diarrhea, constipation, chest pain, heart palpitations, shortness of breath, blood in stool, black tarry stool, urinary pain, urinary burning, urinary frequency, hematuria. Positive for taste alteration. Patient feels he  has a good appetite, increased activity 14 point review of systems was performed and is negative except as detailed under history of present illness and above    PHYSICAL EXAMINATION  ECOG PERFORMANCE STATUS: 1 - Symptomatic but completely ambulatory  Filed Vitals:   08/22/15 0800  BP: 126/82  Pulse: 92  Temp: 97.5 F (36.4 C)  Resp: 20    GENERAL:alert, no distress, well nourished, well developed, comfortable, cooperative, obese. Mood is excellent today. He is smiling and very talkative SKIN: skin color, texture, turgor are normal, no rashes, R check with dark skin lesion approx 5 mm in largest dimension, irregular  L cheek skin lesion removal HEAD: Normocephalic, No masses, lesions, tenderness or abnormalities EYES: normal, PERRLA, EOMI, Conjunctiva are pink and non-injected. Mild left eye drainage. EARS: External ears normal OROPHARYNX:lips, buccal mucosa, and tongue normal and mucous membranes are moist  NECK: supple, thyroid normal size, non-tender, without nodularity, trachea midline LYMPH:  not  examined BREAST:not examined LUNGS: clear to auscultation  HEART: regular rate & rhythm ABDOMEN:abdomen soft, obese and normal bowel sounds BACK: Back symmetric, no curvature. EXTREMITIES:less then 2 second capillary refill, no joint deformities, effusion, or inflammation, no skin discoloration      Left lower extremity is larger than the right. (chronic) with minimal swelling NEURO: alert & oriented x 3 with fluent speech, no focal motor/sensory deficits  LABORATORY DATA:  CBC    Component Value Date/Time   WBC 8.2 08/22/2015 0845   RBC 3.64* 08/22/2015 0845   HGB 12.5* 08/22/2015 0845   HCT 36.7* 08/22/2015 0845   PLT 98* 08/22/2015 0845   MCV 100.8* 08/22/2015 0845   MCH 34.3* 08/22/2015 0845   MCHC 34.1 08/22/2015 0845   RDW 17.0* 08/22/2015 0845   LYMPHSABS 1.3 08/22/2015 0845   MONOABS 1.0 08/22/2015 0845   EOSABS 0.2 08/22/2015 0845   BASOSABS 0.0 08/22/2015 0845      Chemistry      Component Value Date/Time   NA 134* 08/22/2015 0845   K 4.1 08/22/2015 0845   CL 103 08/22/2015 0845   CO2 24 08/22/2015 0845   BUN 12 08/22/2015 0845   CREATININE 0.81 08/22/2015 0845      Component Value Date/Time   CALCIUM 8.5* 08/22/2015 0845   ALKPHOS 138* 08/22/2015 0845   AST 77* 08/22/2015 0845   ALT 55 08/22/2015 0845   BILITOT 1.2 08/22/2015 0845      Lab Results  Component Value Date   CEA 3.0 04/27/2015    ASSESSMENT AND PLAN:  Stage II colorectal cancer Adjuvant FOLFOX Family history of colon cancer Situational depression/anxiety Treatment related fatigue Elevated AST  Seems to be doing well. His weight is up several pounds. He is fairly active and his wife notes he has been out working a lot in the yard. He has been involved with his grandchildren. He denies any worsening neuropathic symptoms. He did follow-up with dermatology in regards to the lesion on his face which has been removed.  I am going to call him in an antibiotics appointment for the left  eye. I am not sure if it is just irritated from his recent skin cancer removal but with ongoing drainage I have recommended a 7 day course of antibiotics.  I have already discontinued his 5-FU bolus. Platelet count today is 98,000. I have decreased his infusional 5-FU today. I am going to proceed with therapy as planned.  He will return in 2 weeks with repeat labs and PE.  He  and his wife know to call in the interim with any problems or concerns.   All questions were answered. The patient knows to call the clinic with any problems, questions or concerns. We can certainly see the patient much sooner if necessary.   This document serves as a record of services personally performed by Ancil Linsey, MD. It was created on her behalf by Janace Hoard, a trained medical scribe. The creation of this record is based on the scribe's personal observations and the provider's statements to them. This document has been checked and approved by the attending provider.  I have reviewed the above documentation for accuracy and completeness, and I agree with the above.  This note was electronically signed.  Kelby Fam. Whitney Muse, MD

## 2015-08-22 NOTE — Patient Instructions (Addendum)
[facility] Discharge Instructions for Patients Receiving Chemotherapy  Today you received the following chemotherapy agents folfox Return Thursday to have your pump removed  Follow up as scheduled Please call the clinic if you have any questions or concerns  To help prevent nausea and vomiting after your treatment, we encourage you to take your nausea medication  If you develop nausea and vomiting, or diarrhea that is not controlled by your medication, call the clinic.  The clinic phone number is [phone]. Office hours are Monday-Friday 8:30am-5:00pm.  BELOW ARE SYMPTOMS THAT SHOULD BE REPORTED IMMEDIATELY:  *FEVER GREATER THAN 101.0 F  *CHILLS WITH OR WITHOUT FEVER  NAUSEA AND VOMITING THAT IS NOT CONTROLLED WITH YOUR NAUSEA MEDICATION  *UNUSUAL SHORTNESS OF BREATH  *UNUSUAL BRUISING OR BLEEDING  TENDERNESS IN MOUTH AND THROAT WITH OR WITHOUT PRESENCE OF ULCERS  *URINARY PROBLEMS  *BOWEL PROBLEMS  UNUSUAL RASH Items with * indicate a potential emergency and should be followed up as soon as possible. If you have an emergency after office hours please contact your primary care physician or go to the nearest emergency department.  Please call the clinic during office hours if you have any questions or concerns.   You may also contact the Patient Navigator at [phone] should you have any questions or need assistance in obtaining follow up care. _____________________________________________________________________ Have you asked about our STAR program?    STAR stands for Survivorship Training and Rehabilitation, and this is a nationally recognized cancer care program that focuses on survivorship and rehabilitation.  Cancer and cancer treatments may cause problems, such as, pain, making you feel tired and keeping you from doing the things that you need or want to do. Cancer rehabilitation can help. Our goal is to reduce these troubling effects and help  you have the best quality of life possible.  You may receive a survey from a nurse that asks questions about your current state of health.  Based on the survey results, all eligible patients will be referred to the [facility] program for an evaluation so we can better serve you! A frequently asked questions sheet is available upon request.

## 2015-08-22 NOTE — Patient Instructions (Signed)
..  [place] at [facility] Discharge Instructions  RECOMMENDATIONS MADE BY THE CONSULTANT AND ANY TEST RESULTS WILL BE SENT TO YOUR REFERRING PHYSICIAN.  Seen and discussion with Dr. Whitney Muse.   Call the cancer center with any questions and/or concerns that you have.   Follow up appt. In 2 weeks.     Thank you for choosing [place] at [facility] to provide your oncology and hematology care.  To afford each patient quality time with our provider, please arrive at least 15 minutes before your scheduled appointment time.    You need to re-schedule your appointment should you arrive 10 or more minutes late.  We strive to give you quality time with our providers, and arriving late affects you and other patients whose appointments are after yours.  Also, if you no show three or more times for appointments you may be dismissed from the clinic at the providers discretion.     Again, thank you for choosing [facility].  Our hope is that these requests will decrease the amount of time that you wait before being seen by our physicians.       _____________________________________________________________  Should you have questions after your visit to [facility], please contact our office at [phone] between the hours of 8:30 a.m. and 4:30 p.m.  Voicemails left after 4:30 p.m. will not be returned until the following business day.  For prescription refill requests, have your pharmacy contact our office.

## 2015-08-24 ENCOUNTER — Encounter (HOSPITAL_BASED_OUTPATIENT_CLINIC_OR_DEPARTMENT_OTHER): Payer: Medicare Other

## 2015-08-24 VITALS — BP 109/68 | HR 87 | Temp 98.0°F | Resp 18

## 2015-08-24 DIAGNOSIS — C189 Malignant neoplasm of colon, unspecified: Secondary | ICD-10-CM

## 2015-08-24 DIAGNOSIS — C186 Malignant neoplasm of descending colon: Secondary | ICD-10-CM | POA: Diagnosis present

## 2015-08-24 DIAGNOSIS — Z5189 Encounter for other specified aftercare: Secondary | ICD-10-CM

## 2015-08-24 MED ORDER — HEPARIN SOD (PORK) LOCK FLUSH 100 UNIT/ML IV SOLN
INTRAVENOUS | Status: AC
  Filled 2015-08-24: qty 5

## 2015-08-24 MED ORDER — PEGFILGRASTIM 6 MG/0.6ML ~~LOC~~ PSKT
6.0000 mg | PREFILLED_SYRINGE | Freq: Once | SUBCUTANEOUS | Status: AC
  Administered 2015-08-24: 6 mg via SUBCUTANEOUS
  Filled 2015-08-24: qty 0.6

## 2015-08-24 MED ORDER — HEPARIN SOD (PORK) LOCK FLUSH 100 UNIT/ML IV SOLN
500.0000 [IU] | Freq: Once | INTRAVENOUS | Status: AC | PRN
  Administered 2015-08-24: 500 [IU]

## 2015-08-24 MED ORDER — SODIUM CHLORIDE 0.9 % IJ SOLN
10.0000 mL | INTRAMUSCULAR | Status: DC | PRN
  Administered 2015-08-24: 10 mL
  Filled 2015-08-24: qty 10

## 2015-08-24 NOTE — Patient Instructions (Signed)
[place] at [facility] Discharge Instructions  RECOMMENDATIONS MADE BY THE CONSULTANT AND ANY TEST RESULTS WILL BE SENT TO YOUR REFERRING PHYSICIAN.  Discontinued continuous infusion pump. Neulasta 6 mg Onpro Body Injector applied to left upper arm. You may safely remove injector tomorrow Friday August 26 at 3 pm.  Thank you for choosing [place] at [facility] to provide your oncology and hematology care.  To afford each patient quality time with our provider, please arrive at least 15 minutes before your scheduled appointment time.    You need to re-schedule your appointment should you arrive 10 or more minutes late.  We strive to give you quality time with our providers, and arriving late affects you and other patients whose appointments are after yours.  Also, if you no show three or more times for appointments you may be dismissed from the clinic at the providers discretion.     Again, thank you for choosing [facility].  Our hope is that these requests will decrease the amount of time that you wait before being seen by our physicians.       _____________________________________________________________  Should you have questions after your visit to [facility], please contact our office at [phone] between the hours of 8:30 a.m. and 4:30 p.m.  Voicemails left after 4:30 p.m. will not be returned until the following business day.  For prescription refill requests, have your pharmacy contact our office.

## 2015-08-24 NOTE — Progress Notes (Signed)
Discontinued continuous infusion pump. Port flushed per protocol. Needle de-accessed. Patient only complains of fatigue and being "wobbly" when he walks. Patient and wife deny any concerns post chemo. Peter KitchenDarrick Penna Fincanonpresents today for neulasta OBI placement per MD orders. OBI device filled per protocol and placed on left Upper Arm. Needle/catheter placement noted prior to patient leaving. Tolerated without incident and aware of injection to be delivered in  27 hours.

## 2015-09-05 ENCOUNTER — Encounter (HOSPITAL_BASED_OUTPATIENT_CLINIC_OR_DEPARTMENT_OTHER): Payer: Medicare Other | Admitting: Oncology

## 2015-09-05 ENCOUNTER — Encounter (HOSPITAL_COMMUNITY): Payer: Self-pay | Admitting: Oncology

## 2015-09-05 ENCOUNTER — Encounter (HOSPITAL_COMMUNITY): Payer: Medicare Other | Attending: Hematology & Oncology

## 2015-09-05 VITALS — BP 145/91 | HR 99 | Temp 97.7°F | Resp 18 | Wt 253.6 lb

## 2015-09-05 DIAGNOSIS — C187 Malignant neoplasm of sigmoid colon: Secondary | ICD-10-CM | POA: Diagnosis present

## 2015-09-05 DIAGNOSIS — C189 Malignant neoplasm of colon, unspecified: Secondary | ICD-10-CM | POA: Diagnosis present

## 2015-09-05 DIAGNOSIS — Z452 Encounter for adjustment and management of vascular access device: Secondary | ICD-10-CM

## 2015-09-05 DIAGNOSIS — R195 Other fecal abnormalities: Secondary | ICD-10-CM | POA: Insufficient documentation

## 2015-09-05 LAB — CBC WITH DIFFERENTIAL/PLATELET
BASOS PCT: 0 % (ref 0–1)
Basophils Absolute: 0 10*3/uL (ref 0.0–0.1)
Eosinophils Absolute: 0.1 10*3/uL (ref 0.0–0.7)
Eosinophils Relative: 1 % (ref 0–5)
HEMATOCRIT: 39.7 % (ref 39.0–52.0)
HEMOGLOBIN: 13.4 g/dL (ref 13.0–17.0)
Lymphocytes Relative: 15 % (ref 12–46)
Lymphs Abs: 1.4 10*3/uL (ref 0.7–4.0)
MCH: 34.4 pg — ABNORMAL HIGH (ref 26.0–34.0)
MCHC: 33.8 g/dL (ref 30.0–36.0)
MCV: 101.8 fL — ABNORMAL HIGH (ref 78.0–100.0)
MONOS PCT: 11 % (ref 3–12)
Monocytes Absolute: 1.1 10*3/uL — ABNORMAL HIGH (ref 0.1–1.0)
NEUTROS ABS: 7.1 10*3/uL (ref 1.7–7.7)
NEUTROS PCT: 72 % (ref 43–77)
Platelets: 110 10*3/uL — ABNORMAL LOW (ref 150–400)
RBC: 3.9 MIL/uL — AB (ref 4.22–5.81)
RDW: 17.6 % — ABNORMAL HIGH (ref 11.5–15.5)
WBC: 9.8 10*3/uL (ref 4.0–10.5)

## 2015-09-05 LAB — COMPREHENSIVE METABOLIC PANEL
ALT: 30 U/L (ref 17–63)
AST: 53 U/L — AB (ref 15–41)
Albumin: 2.7 g/dL — ABNORMAL LOW (ref 3.5–5.0)
Alkaline Phosphatase: 160 U/L — ABNORMAL HIGH (ref 38–126)
Anion gap: 11 (ref 5–15)
BILIRUBIN TOTAL: 2.3 mg/dL — AB (ref 0.3–1.2)
BUN: 12 mg/dL (ref 6–20)
CALCIUM: 8.5 mg/dL — AB (ref 8.9–10.3)
CO2: 22 mmol/L (ref 22–32)
Chloride: 103 mmol/L (ref 101–111)
Creatinine, Ser: 0.97 mg/dL (ref 0.61–1.24)
GFR calc Af Amer: >60 mL/min (ref >60)
GFR calc non Af Amer: >60 mL/min (ref >60)
GLUCOSE: 123 mg/dL — AB (ref 65–99)
Potassium: 3.9 mmol/L (ref 3.5–5.1)
Sodium: 136 mmol/L (ref 135–145)
TOTAL PROTEIN: 5.9 g/dL — AB (ref 6.5–8.1)

## 2015-09-05 MED ORDER — SODIUM CHLORIDE 0.9 % IJ SOLN
10.0000 mL | INTRAMUSCULAR | Status: DC | PRN
  Administered 2015-09-05: 10 mL via INTRAVENOUS
  Filled 2015-09-05: qty 10

## 2015-09-05 MED ORDER — HEPARIN SOD (PORK) LOCK FLUSH 100 UNIT/ML IV SOLN
INTRAVENOUS | Status: AC
  Filled 2015-09-05: qty 5

## 2015-09-05 MED ORDER — HEPARIN SOD (PORK) LOCK FLUSH 100 UNIT/ML IV SOLN
500.0000 [IU] | Freq: Once | INTRAVENOUS | Status: AC
  Administered 2015-09-05: 500 [IU] via INTRAVENOUS

## 2015-09-05 NOTE — Progress Notes (Signed)
Hold treatment today per Dr. Whitney Muse due to total bilirubin of 2.3.  Will return for lab recheck and possible treatment next week.  See office visit encounter for further.

## 2015-09-05 NOTE — Progress Notes (Signed)
[name], MD [address] [place] [address]  Colon carcinoma  CURRENT THERAPY: S/P 7 cycles of FOLFOX  INTERVAL HISTORY: [name] 69 y.o. male returns for followup of Stage IIA adenocarcinoma of colon, S/P definitive surgery by Dr. Arnoldo Morale on 04/03/2015, and now undergoing adjuvant FOLFOX chemotherapy.     Colon carcinoma   03/28/2015 Initial Biopsy Diagnosis 1. Colon, biopsy, left descending - INVASIVE ADENOCARCINOMA. 2. Stomach, biopsy - GASTRIC BODY AND ANTRAL-TYPE MUCOSA WITH ASSOCIATED MINIMAL CHRONIC INFLAMMATION. - NO EVIDENCE OF HELICOBACTER PYLORI, INTESTINAL METAPLASIA, DYSPLASIA OR MA   03/29/2015 Imaging CT abd/pelvis- Circumferential mucosal lesion the proximal sigmoid colon consists with colorectal carcinoma.   04/03/2015 Definitive Surgery Colon, segmental resection for tumor - INVASIVE ADENOCARCINOMA, INVADING THROUGH THE MUSCULARIS PROPRIA INTO PERICOLONIC FATTY TISSUE. - FOURTEEN LYMPH NODES, NEGATIVE FOR METASTATIC CARCINOMA (0/14). - RESECTION MARGINS, NEGATIVE FOR ATYPIA OR MALIGNA   05/16/2015 -  Chemotherapy FOLFOX    05/31/2015 Treatment Plan Change Oxaliplatin dose reduced by 20% secondary to fatigue from cycle 1 of therapy.   07/24/2015 Treatment Plan Change 5 FU bolus D/C'd for cycle 5 and subsequent cycles due to thrombocytopenia.    I personally reviewed and went over laboratory results with the patient.  The results are noted within this dictation.  They will be updated today.  1 week ago, Mr. Micale fell in his bathroom.  Fortunately, he hit the wall which saved his fall.  He reports he tripped over his feet.  He denies any neuropathy in his feet and he confirms that he does not have difficulty waling or ambulating.  He notes dizziness, that is intermittent without any particular pattern.  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  He denies that this interferes and causes his falls.   He took a bath the other day which he has not done in 20 years.  He reports that he could not get out of the tub.  He was in the tub from 10:30 AM- 2 PM until he had help to get him out.  His wife report that he has had bad knees for many years and he knows that he cannot take a bath.  "He has trouble getting out of a chair from a sitting position, let alone in a bathtub.   Past Medical History  Diagnosis Date  . Varicose veins   . New onset atrial fibrillation 01/02/2015  . Morbid obesity 01/02/2015  . Hypertension   . Dysrhythmia     Afib- on set 12/2014  . Sleep apnea     been tested but has not received the CPAP yet  . GERD (gastroesophageal reflux disease)   . History of gout   . Cancer     colon  . Family history of colon cancer   . Family history of breast cancer in mother     has Varicose veins of lower extremities with other complications; New onset atrial fibrillation; Morbid obesity; Melena; Heme positive stool; Esophageal dysphagia; Diarrhea; Occult blood in stools; Dysphagia, pharyngoesophageal phase; Colon carcinoma; Family history of colon cancer; Family history of breast cancer in mother; and Cellulitis on his problem list.     has No Known Allergies.  Current Outpatient Prescriptions on File Prior to Visit  Medication Sig Dispense Refill  . apixaban (ELIQUIS) 5 MG TABS tablet Take  5 mg by mouth daily.    . ciprofloxacin (CILOXAN) 0.3 % ophthalmic solution Administer 1 drop, every 2 hours, while awake, for 2 days. Then 1 drop, every 4 hours, while awake, for the next 5 days. 5 mL 0  . dextrose 5 % SOLN 1,000 mL with fluorouracil 5 GM/100ML SOLN Inject into the vein every 14 (fourteen) days. To infuse over 46 hours. To be given in near future    . escitalopram (LEXAPRO) 20 MG tablet Take 1 tablet (20 mg total) by mouth daily. 30 tablet 2  . LEUCOVORIN CALCIUM IV Inject into the  vein every 14 (fourteen) days. To be started in near future    . lidocaine-prilocaine (EMLA) cream Apply a quarter size amount to port site 1 hour prior to chemo. Do not rub in. Cover with plastic wrap. 30 g 3  . lisinopril (PRINIVIL,ZESTRIL) 10 MG tablet Take 1 tablet (10 mg total) by mouth daily. 30 tablet 12  . omeprazole (PRILOSEC) 20 MG capsule Take 1 capsule (20 mg total) by mouth daily. 30 capsule 3  . ondansetron (ZOFRAN) 8 MG tablet Take 1 tablet (8 mg total) by mouth every 8 (eight) hours as needed. 30 tablet 3  . OXALIPLATIN IV Inject into the vein every 14 (fourteen) days. To start in near future    . prochlorperazine (COMPAZINE) 10 MG tablet Take 1 tablet (10 mg total) by mouth every 6 (six) hours as needed for nausea or vomiting. 30 tablet 3  . cephALEXin (KEFLEX) 500 MG capsule Take 1 capsule (500 mg total) by mouth 4 (four) times daily. (Patient not taking: Reported on 08/22/2015) 28 capsule 0  . HYDROcodone-acetaminophen (NORCO/VICODIN) 5-325 MG per tablet Take 1-2 tablets by mouth every 6 (six) hours as needed for moderate pain. (Patient not taking: Reported on 08/22/2015) 40 tablet 0  . ibuprofen (ADVIL,MOTRIN) 200 MG tablet Take 400 mg by mouth every 6 (six) hours as needed for mild pain or moderate pain (for knee pain).    . potassium chloride SA (K-DUR,KLOR-CON) 20 MEQ tablet Take 1 tablet (20 mEq total) by mouth 2 (two) times daily. (Patient not taking: Reported on 08/05/2015) 14 tablet 0  . sulfamethoxazole-trimethoprim (BACTRIM DS,SEPTRA DS) 800-160 MG per tablet Take 1 tablet by mouth 2 (two) times daily. (Patient not taking: Reported on 08/17/2015) 14 tablet 0   Current Facility-Administered Medications on File Prior to Visit  Medication Dose Route Frequency Provider Last Rate Last Dose  . sodium chloride 0.9 % injection 10 mL  10 mL Intravenous PRN [name], MD   10 mL at 09/05/15 1020    Past Surgical History  Procedure Laterality Date  . Vein ligation and  stripping      left leg  . Colonoscopy N/A 03/28/2015    Procedure: COLONOSCOPY;  Surgeon: [name], MD;  [facility];  Service: Endoscopy;  Laterality: N/A;  . Esophagogastroduodenoscopy N/A 03/28/2015    Procedure: ESOPHAGOGASTRODUODENOSCOPY (EGD);  Surgeon: [name], MD;  Location: [facility];  Service: Endoscopy;  Laterality: N/A;  . Esophageal dilation N/A 03/28/2015    Procedure: ESOPHAGEAL DILATION;  Surgeon: [name], MD;  Location: [facility];  Service: Endoscopy;  Laterality: N/A;  . Partial colectomy N/A 04/03/2015    Procedure: PARTIAL COLECTOMY;  Surgeon: [name], MD;  Location: [facility];  Service: General;  Laterality: N/A;  . Portacath placement N/A 05/10/2015    Procedure: INSERTION PORT-A-CATH;  Surgeon: [name], MD;  Location: [facility];  Service: General;  Laterality: N/A;  left subclavian  . Skin cancer destruction Left 08/16/15    left side of nose and left back    Denies any headaches, dizziness, double vision, fevers, chills, night sweats, nausea, vomiting, diarrhea, constipation, chest pain, heart palpitations, shortness of breath, blood in stool, black tarry stool, urinary pain, urinary burning, urinary frequency, hematuria.   PHYSICAL EXAMINATION  ECOG PERFORMANCE STATUS: 0 - Asymptomatic  Filed Vitals:   09/05/15 0844  BP: 145/91  Pulse: 99  Temp: 97.7 F (36.5 C)  Resp: 18    GENERAL:alert, no distress, well developed, comfortable, cooperative, obese, smiling and unaccompanied in treatment room. SKIN: skin color, texture, turgor are normal, no rashes or significant lesions HEAD: Normocephalic, No masses, lesions, tenderness or abnormalities EYES: normal, PERRLA, EOMI, Conjunctiva are pink and non-injected EARS: External ears normal OROPHARYNX:lips, buccal mucosa, and tongue normal and mucous membranes are moist  NECK: supple, trachea midline.  Negative Lhermitte's phenomenon  LYMPH:  No adenopathy BREAST:not  examined LUNGS: clear to auscultation  HEART: irregularly irregular ABDOMEN:abdomen soft, non-tender, obese and normal bowel sounds BACK: Back symmetric, no curvature. EXTREMITIES:less then 2 second capillary refill, no joint deformities, effusion, or inflammation, no cyanosis NEURO: alert & oriented x 3 with fluent speech, no focal motor/sensory deficits, gait normal    LABORATORY DATA: CBC    Component Value Date/Time   WBC 9.8 09/05/2015 0833   RBC 3.90* 09/05/2015 0833   HGB 13.4 09/05/2015 0833   HCT 39.7 09/05/2015 0833   PLT 110* 09/05/2015 0833   MCV 101.8* 09/05/2015 0833   MCH 34.4* 09/05/2015 0833   MCHC 33.8 09/05/2015 0833   RDW 17.6* 09/05/2015 0833   LYMPHSABS 1.4 09/05/2015 0833   MONOABS 1.1* 09/05/2015 0833   EOSABS 0.1 09/05/2015 0833   BASOSABS 0.0 09/05/2015 0833      Chemistry      Component Value Date/Time   NA 136 09/05/2015 0833   K 3.9 09/05/2015 0833   CL 103 09/05/2015 0833   CO2 22 09/05/2015 0833   BUN 12 09/05/2015 0833   CREATININE 0.97 09/05/2015 0833      Component Value Date/Time   CALCIUM 8.5* 09/05/2015 0833   ALKPHOS 160* 09/05/2015 0833   AST 53* 09/05/2015 0833   ALT 30 09/05/2015 0833   BILITOT 2.3* 09/05/2015 6384        PENDING LABS:   RADIOGRAPHIC STUDIES:  No results found.   PATHOLOGY:    ASSESSMENT AND PLAN:  Colon carcinoma Stage IIA adenocarcinoma of colon, S/P definitive surgery by Dr. Arnoldo Morale on 04/03/2015, and now undergoing adjuvant FOLFOX chemotherapy for high risk factors.  Treatment will provide an 8% absolute survival advantage and the patient is interested in pursuing systemic chemotherapy.  Oxaliplatin dose is reduced by 20% beginning on cycle #2.  Pre-chemo labs today: CBC diff, CMET   Recent falls and inability to get out of a tub as dictated above.  Pending pre-chemo labs.  If parameters are met, will treat today as planned.  Bilirubin is increased today at 2.3.  Etiology unclear.  Will  hold chemotherapy today and repeat labs in 1 week.  If not improved, will US liver.  Return in 3 weeks for follow-up and treatment.  We will continue re-evaluate treatment goals at each follow-up visit throughout treatment.    THERAPY PLAN:  Will defer treatment today x 1 week due to bilirubin increase.  All questions were answered. The patient knows to call  the clinic with any problems, questions or concerns. We can certainly see the patient much sooner if necessary.  Patient and plan discussed with Dr. Ancil Linsey and she is in agreement with the aforementioned.   This note is electronically signed by: Doy Mince 09/05/2015 10:21 AM

## 2015-09-05 NOTE — Patient Instructions (Signed)
[place] at [facility] Discharge Instructions  RECOMMENDATIONS MADE BY THE CONSULTANT AND ANY TEST RESULTS WILL BE SENT TO YOUR REFERRING PHYSICIAN.  Exam and discussion by Robynn Pane, PA-C Will treat today if labs are ok. Report uncontrolled nausea, vomiting or other concerns.  Follow-up in 2 weeks with treatment and office visit.  Thank you for choosing [place] at [facility] to provide your oncology and hematology care.  To afford each patient quality time with our provider, please arrive at least 15 minutes before your scheduled appointment time.    You need to re-schedule your appointment should you arrive 10 or more minutes late.  We strive to give you quality time with our providers, and arriving late affects you and other patients whose appointments are after yours.  Also, if you no show three or more times for appointments you may be dismissed from the clinic at the providers discretion.     Again, thank you for choosing [facility].  Our hope is that these requests will decrease the amount of time that you wait before being seen by our physicians.       _____________________________________________________________  Should you have questions after your visit to [facility], please contact our office at [phone] between the hours of 8:30 a.m. and 4:30 p.m.  Voicemails left after 4:30 p.m. will not be returned until the following business day.  For prescription refill requests, have your pharmacy contact our office.

## 2015-09-05 NOTE — Assessment & Plan Note (Addendum)
Stage IIA adenocarcinoma of colon, S/P definitive surgery by Dr. Arnoldo Morale on 04/03/2015, and now undergoing adjuvant FOLFOX chemotherapy for high risk factors.  Treatment will provide an 8% absolute survival advantage and the patient is interested in pursuing systemic chemotherapy.  Oxaliplatin dose is reduced by 20% beginning on cycle #2.  Pre-chemo labs today: CBC diff, CMET   Recent falls and inability to get out of a tub as dictated above.  Pending pre-chemo labs.  If parameters are met, will treat today as planned.  Bilirubin is increased today at 2.3.  Etiology unclear.  Will hold chemotherapy today and repeat labs in 1 week.  If not improved, will US liver.  Return in 3 weeks for follow-up and treatment.  We will continue re-evaluate treatment goals at each follow-up visit throughout treatment.

## 2015-09-07 ENCOUNTER — Encounter (HOSPITAL_COMMUNITY): Payer: Medicare Other

## 2015-09-12 ENCOUNTER — Other Ambulatory Visit (HOSPITAL_COMMUNITY): Payer: Self-pay | Admitting: Oncology

## 2015-09-12 ENCOUNTER — Encounter (HOSPITAL_BASED_OUTPATIENT_CLINIC_OR_DEPARTMENT_OTHER): Payer: Medicare Other

## 2015-09-12 ENCOUNTER — Encounter (HOSPITAL_COMMUNITY): Payer: Self-pay

## 2015-09-12 ENCOUNTER — Ambulatory Visit (HOSPITAL_COMMUNITY)
Admission: RE | Admit: 2015-09-12 | Discharge: 2015-09-12 | Disposition: A | Discharge status: Home / Self Care | Payer: Medicare Other | Source: Ambulatory Visit | Attending: Oncology | Admitting: Oncology

## 2015-09-12 DIAGNOSIS — C189 Malignant neoplasm of colon, unspecified: Secondary | ICD-10-CM

## 2015-09-12 DIAGNOSIS — Z452 Encounter for adjustment and management of vascular access device: Secondary | ICD-10-CM

## 2015-09-12 DIAGNOSIS — R748 Abnormal levels of other serum enzymes: Secondary | ICD-10-CM | POA: Diagnosis present

## 2015-09-12 DIAGNOSIS — R932 Abnormal findings on diagnostic imaging of liver and biliary tract: Secondary | ICD-10-CM | POA: Insufficient documentation

## 2015-09-12 DIAGNOSIS — C186 Malignant neoplasm of descending colon: Secondary | ICD-10-CM | POA: Diagnosis not present

## 2015-09-12 DIAGNOSIS — C187 Malignant neoplasm of sigmoid colon: Secondary | ICD-10-CM | POA: Diagnosis not present

## 2015-09-12 LAB — COMPREHENSIVE METABOLIC PANEL
ALBUMIN: 2.5 g/dL — AB (ref 3.5–5.0)
ALT: 40 U/L (ref 17–63)
ANION GAP: 9 (ref 5–15)
AST: 67 U/L — AB (ref 15–41)
Alkaline Phosphatase: 131 U/L — ABNORMAL HIGH (ref 38–126)
BILIRUBIN TOTAL: 2.5 mg/dL — AB (ref 0.3–1.2)
BUN: 10 mg/dL (ref 6–20)
CO2: 23 mmol/L (ref 22–32)
Calcium: 8.6 mg/dL — ABNORMAL LOW (ref 8.9–10.3)
Chloride: 105 mmol/L (ref 101–111)
Creatinine, Ser: 0.86 mg/dL (ref 0.61–1.24)
GFR calc Af Amer: >60 mL/min (ref >60)
Glucose, Bld: 126 mg/dL — ABNORMAL HIGH (ref 65–99)
Potassium: 3.9 mmol/L (ref 3.5–5.1)
Sodium: 137 mmol/L (ref 135–145)
TOTAL PROTEIN: 5.7 g/dL — AB (ref 6.5–8.1)

## 2015-09-12 LAB — CBC WITH DIFFERENTIAL/PLATELET
BASOS ABS: 0 10*3/uL (ref 0.0–0.1)
BASOS PCT: 1 % (ref 0–1)
Eosinophils Absolute: 0.1 10*3/uL (ref 0.0–0.7)
Eosinophils Relative: 2 % (ref 0–5)
HEMATOCRIT: 36.9 % — AB (ref 39.0–52.0)
Hemoglobin: 12.8 g/dL — ABNORMAL LOW (ref 13.0–17.0)
Lymphocytes Relative: 24 % (ref 12–46)
Lymphs Abs: 1.2 10*3/uL (ref 0.7–4.0)
MCH: 35.6 pg — ABNORMAL HIGH (ref 26.0–34.0)
MCHC: 34.7 g/dL (ref 30.0–36.0)
MCV: 102.5 fL — AB (ref 78.0–100.0)
MONO ABS: 0.8 10*3/uL (ref 0.1–1.0)
Monocytes Relative: 16 % — ABNORMAL HIGH (ref 3–12)
Neutro Abs: 3 10*3/uL (ref 1.7–7.7)
Neutrophils Relative %: 57 % (ref 43–77)
PLATELETS: 134 10*3/uL — AB (ref 150–400)
RBC: 3.6 MIL/uL — ABNORMAL LOW (ref 4.22–5.81)
RDW: 17.3 % — ABNORMAL HIGH (ref 11.5–15.5)
WBC: 5.1 10*3/uL (ref 4.0–10.5)

## 2015-09-12 MED ORDER — HEPARIN SOD (PORK) LOCK FLUSH 100 UNIT/ML IV SOLN
INTRAVENOUS | Status: AC
  Filled 2015-09-12: qty 5

## 2015-09-12 MED ORDER — SODIUM CHLORIDE 0.9 % IJ SOLN
10.0000 mL | INTRAMUSCULAR | Status: DC | PRN
  Administered 2015-09-12: 10 mL via INTRAVENOUS
  Filled 2015-09-12: qty 10

## 2015-09-12 MED ORDER — HEPARIN SOD (PORK) LOCK FLUSH 100 UNIT/ML IV SOLN
500.0000 [IU] | Freq: Once | INTRAVENOUS | Status: AC
  Administered 2015-09-12: 500 [IU] via INTRAVENOUS

## 2015-09-12 NOTE — Progress Notes (Signed)
0935:  Dr. Whitney Muse notified of total bilirubin of 2.5.  Will hold tx today per MD; abdominal (RUQ) [place] ordered.

## 2015-09-12 NOTE — Patient Instructions (Signed)
[place] at [facility] Discharge Instructions  RECOMMENDATIONS MADE BY THE CONSULTANT AND ANY TEST RESULTS WILL BE SENT TO YOUR REFERRING PHYSICIAN.  We will hold your chemotherapy today due to your bilirubin being elevated (2.5). Report to the Radiology department today as scheduled for your abdominal ultrasound. Follow up with Dr. Whitney Muse next week as scheduled.  Thank you for choosing [place] at [facility] to provide your oncology and hematology care.  To afford each patient quality time with our provider, please arrive at least 15 minutes before your scheduled appointment time.    You need to re-schedule your appointment should you arrive 10 or more minutes late.  We strive to give you quality time with our providers, and arriving late affects you and other patients whose appointments are after yours.  Also, if you no show three or more times for appointments you may be dismissed from the clinic at the providers discretion.     Again, thank you for choosing [facility].  Our hope is that these requests will decrease the amount of time that you wait before being seen by our physicians.       _____________________________________________________________  Should you have questions after your visit to [facility], please contact our office at [phone] between the hours of 8:30 a.m. and 4:30 p.m.  Voicemails left after 4:30 p.m. will not be returned until the following business day.  For prescription refill requests, have your pharmacy contact our office.

## 2015-09-13 ENCOUNTER — Other Ambulatory Visit (HOSPITAL_COMMUNITY): Payer: Self-pay | Admitting: Oncology

## 2015-09-13 DIAGNOSIS — I482 Chronic atrial fibrillation, unspecified: Secondary | ICD-10-CM

## 2015-09-13 DIAGNOSIS — C189 Malignant neoplasm of colon, unspecified: Secondary | ICD-10-CM

## 2015-09-13 DIAGNOSIS — R17 Unspecified jaundice: Secondary | ICD-10-CM

## 2015-09-14 ENCOUNTER — Encounter (HOSPITAL_COMMUNITY): Payer: Medicare Other

## 2015-09-15 ENCOUNTER — Telehealth (HOSPITAL_COMMUNITY): Payer: Self-pay | Admitting: *Deleted

## 2015-09-15 NOTE — Telephone Encounter (Signed)
[name] called to report that Mr. [name] has not had a BM in at least 3 days. We discussed implementing bowel regimen and she sill start with 2 sennakot and 2 tbsp MOM. She will repeat in 6-8 hrs if needed. She reports that his urine smells bad and is dark yellow/orange in color. Explained that it may be related to elevated bilirubin. No other symptoms of infection.

## 2015-09-18 ENCOUNTER — Ambulatory Visit (HOSPITAL_BASED_OUTPATIENT_CLINIC_OR_DEPARTMENT_OTHER)
Admission: RE | Admit: 2015-09-18 | Discharge: 2015-09-18 | Disposition: A | Discharge status: Home / Self Care | Payer: Medicare Other | Source: Ambulatory Visit | Attending: Oncology | Admitting: Oncology

## 2015-09-18 ENCOUNTER — Ambulatory Visit (HOSPITAL_COMMUNITY)
Admission: RE | Admit: 2015-09-18 | Discharge: 2015-09-18 | Disposition: A | Discharge status: Home / Self Care | Payer: Medicare Other | Source: Ambulatory Visit | Attending: Oncology | Admitting: Oncology

## 2015-09-18 DIAGNOSIS — R933 Abnormal findings on diagnostic imaging of other parts of digestive tract: Secondary | ICD-10-CM | POA: Insufficient documentation

## 2015-09-18 DIAGNOSIS — Z9221 Personal history of antineoplastic chemotherapy: Secondary | ICD-10-CM | POA: Insufficient documentation

## 2015-09-18 DIAGNOSIS — C189 Malignant neoplasm of colon, unspecified: Secondary | ICD-10-CM | POA: Insufficient documentation

## 2015-09-18 DIAGNOSIS — R17 Unspecified jaundice: Secondary | ICD-10-CM | POA: Insufficient documentation

## 2015-09-18 DIAGNOSIS — I081 Rheumatic disorders of both mitral and tricuspid valves: Secondary | ICD-10-CM | POA: Insufficient documentation

## 2015-09-18 DIAGNOSIS — I509 Heart failure, unspecified: Secondary | ICD-10-CM | POA: Insufficient documentation

## 2015-09-18 DIAGNOSIS — I482 Chronic atrial fibrillation, unspecified: Secondary | ICD-10-CM

## 2015-09-18 MED ORDER — IOHEXOL 300 MG/ML  SOLN
100.0000 mL | Freq: Once | INTRAMUSCULAR | Status: AC | PRN
  Administered 2015-09-18: 100 mL via INTRAVENOUS

## 2015-09-19 ENCOUNTER — Encounter (HOSPITAL_BASED_OUTPATIENT_CLINIC_OR_DEPARTMENT_OTHER): Payer: Medicare Other | Admitting: Hematology & Oncology

## 2015-09-19 ENCOUNTER — Encounter (HOSPITAL_COMMUNITY): Payer: Self-pay | Admitting: Hematology & Oncology

## 2015-09-19 ENCOUNTER — Encounter (HOSPITAL_BASED_OUTPATIENT_CLINIC_OR_DEPARTMENT_OTHER): Payer: Medicare Other

## 2015-09-19 ENCOUNTER — Telehealth: Payer: Self-pay | Admitting: Gastroenterology

## 2015-09-19 ENCOUNTER — Inpatient Hospital Stay (HOSPITAL_COMMUNITY): Payer: Medicare Other

## 2015-09-19 ENCOUNTER — Other Ambulatory Visit: Payer: Self-pay

## 2015-09-19 ENCOUNTER — Ambulatory Visit (HOSPITAL_COMMUNITY): Payer: Medicare Other | Admitting: Hematology & Oncology

## 2015-09-19 VITALS — BP 101/72 | HR 87 | Temp 98.3°F | Resp 18 | Wt 259.0 lb

## 2015-09-19 DIAGNOSIS — R42 Dizziness and giddiness: Secondary | ICD-10-CM

## 2015-09-19 DIAGNOSIS — R748 Abnormal levels of other serum enzymes: Secondary | ICD-10-CM

## 2015-09-19 DIAGNOSIS — C189 Malignant neoplasm of colon, unspecified: Secondary | ICD-10-CM

## 2015-09-19 DIAGNOSIS — C186 Malignant neoplasm of descending colon: Secondary | ICD-10-CM

## 2015-09-19 DIAGNOSIS — R198 Other specified symptoms and signs involving the digestive system and abdomen: Secondary | ICD-10-CM

## 2015-09-19 DIAGNOSIS — C187 Malignant neoplasm of sigmoid colon: Secondary | ICD-10-CM | POA: Diagnosis not present

## 2015-09-19 DIAGNOSIS — K5900 Constipation, unspecified: Secondary | ICD-10-CM

## 2015-09-19 LAB — COMPREHENSIVE METABOLIC PANEL
ALK PHOS: 114 U/L (ref 38–126)
ALT: 31 U/L (ref 17–63)
AST: 54 U/L — AB (ref 15–41)
Albumin: 2.4 g/dL — ABNORMAL LOW (ref 3.5–5.0)
Anion gap: 6 (ref 5–15)
BUN: 7 mg/dL (ref 6–20)
CALCIUM: 8.2 mg/dL — AB (ref 8.9–10.3)
CHLORIDE: 105 mmol/L (ref 101–111)
CO2: 25 mmol/L (ref 22–32)
CREATININE: 0.89 mg/dL (ref 0.61–1.24)
GFR calc Af Amer: >60 mL/min (ref >60)
Glucose, Bld: 114 mg/dL — ABNORMAL HIGH (ref 65–99)
Potassium: 4 mmol/L (ref 3.5–5.1)
Sodium: 136 mmol/L (ref 135–145)
Total Bilirubin: 3.2 mg/dL — ABNORMAL HIGH (ref 0.3–1.2)
Total Protein: 5.8 g/dL — ABNORMAL LOW (ref 6.5–8.1)

## 2015-09-19 LAB — CBC WITH DIFFERENTIAL/PLATELET
BASOS ABS: 0 10*3/uL (ref 0.0–0.1)
Basophils Relative: 0 %
Eosinophils Absolute: 0.2 10*3/uL (ref 0.0–0.7)
Eosinophils Relative: 3 %
HEMATOCRIT: 36.9 % — AB (ref 39.0–52.0)
HEMOGLOBIN: 12.5 g/dL — AB (ref 13.0–17.0)
LYMPHS ABS: 1.1 10*3/uL (ref 0.7–4.0)
LYMPHS PCT: 13 %
MCH: 34.7 pg — AB (ref 26.0–34.0)
MCHC: 33.9 g/dL (ref 30.0–36.0)
MCV: 102.5 fL — AB (ref 78.0–100.0)
Monocytes Absolute: 1.1 10*3/uL — ABNORMAL HIGH (ref 0.1–1.0)
Monocytes Relative: 13 %
NEUTROS ABS: 6.4 10*3/uL (ref 1.7–7.7)
NEUTROS PCT: 72 %
RBC: 3.6 MIL/uL — AB (ref 4.22–5.81)
RDW: 16 % — ABNORMAL HIGH (ref 11.5–15.5)
WBC: 8.8 10*3/uL (ref 4.0–10.5)

## 2015-09-19 NOTE — Progress Notes (Signed)
Peter Bogus, MD [place]  [place] [address]  Stage IIA adenocarcinoma of the colon  CURRENT THERAPY: Adjuvant FOLFOX   INTERVAL HISTORY: [name] 69 y.o. male returns for followup of Stage IIA adenocarcinoma of colon, S/P definitive surgery by Dr. Arnoldo Morale on 04/03/2015, and now undergoing adjuvant FOLFOX chemotherapy.   He did fairly well with therapy until a few weeks ago. He developed an elevated bilirubin of uncertain etiology. Chemotherapy was held. An ultrasound of the liver was performed on 09/12/2015 that showed diffuse increase in liver echogenicity. This was felt to be most likely secondary to hepatic steatosis. CT of the abdomen and pelvis was performed on 09/18/2015 that showed circumferential wall thickening in the rectosigmoid junction and rectum with mild perirectal edema and inflammation. No evidence of metastatic disease. Stable left adrenal nodule. Persistent marked vascular collateralization in the left abdomen and nodularity to the liver contour.  The patient has not been feeling well that last few weeks.  He has been weak and has not had bowel control. He states he thinks he is constipated but then he cannot make it "to the toilet." He reports some blood in his stool. His wife states that he is dizzy.  He notes that this has been occurring most of the time on a regular basis and all of the time the past few weeks.  He fell again last night. His wife states she thinks at times his speech is slurred. He is present today in the wheelchair     Colon carcinoma   03/28/2015 Initial Biopsy Diagnosis 1. Colon, biopsy, left descending - INVASIVE ADENOCARCINOMA. 2. Stomach, biopsy - GASTRIC BODY AND ANTRAL-TYPE MUCOSA WITH ASSOCIATED MINIMAL CHRONIC INFLAMMATION. - NO EVIDENCE OF HELICOBACTER PYLORI, INTESTINAL METAPLASIA, DYSPLASIA OR MA   03/29/2015 Imaging CT abd/pelvis- Circumferential mucosal lesion the proximal sigmoid colon consists  with colorectal carcinoma.   04/03/2015 Definitive Surgery Colon, segmental resection for tumor - INVASIVE ADENOCARCINOMA, INVADING THROUGH THE MUSCULARIS PROPRIA INTO PERICOLONIC FATTY TISSUE. - FOURTEEN LYMPH NODES, NEGATIVE FOR METASTATIC CARCINOMA (0/14). - RESECTION MARGINS, NEGATIVE FOR ATYPIA OR MALIGNA   05/16/2015 -  Chemotherapy FOLFOX    05/31/2015 Treatment Plan Change Oxaliplatin dose reduced by 20% secondary to fatigue from cycle 1 of therapy.   07/24/2015 Treatment Plan Change 5 FU bolus D/C'd for cycle 5 and subsequent cycles due to thrombocytopenia.     Past Medical History  Diagnosis Date  . Varicose veins   . New onset atrial fibrillation 01/02/2015  . Morbid obesity 01/02/2015  . Hypertension   . Dysrhythmia     Afib- on set 12/2014  . Sleep apnea     been tested but has not received the CPAP yet  . GERD (gastroesophageal reflux disease)   . History of gout   . Cancer     colon  . Family history of colon cancer   . Family history of breast cancer in mother     has Varicose veins of lower extremities with other complications; New onset atrial fibrillation; Morbid obesity; Melena; Heme positive stool; Esophageal dysphagia; Diarrhea; Occult blood in stools; Dysphagia, pharyngoesophageal phase; Colon carcinoma; Family history of colon cancer; Family history of breast cancer in mother; and Cellulitis on his problem list.   He believes that his sister died of heart issues, she died in her sleep.   has No Known Allergies.  Current Outpatient Prescriptions on File Prior to Visit  Medication Sig Dispense Refill  .  apixaban (ELIQUIS) 5 MG TABS tablet Take 5 mg by mouth daily.    . cephALEXin (KEFLEX) 500 MG capsule Take 1 capsule (500 mg total) by mouth 4 (four) times daily. (Patient not taking: Reported on 08/22/2015) 28 capsule 0  . ciprofloxacin (CILOXAN) 0.3 % ophthalmic solution Administer 1 drop, every 2 hours, while awake, for 2 days. Then 1 drop, every 4 hours, while awake,  for the next 5 days. 5 mL 0  . dextrose 5 % SOLN 1,000 mL with fluorouracil 5 GM/100ML SOLN Inject into the vein every 14 (fourteen) days. To infuse over 46 hours. To be given in near future    . escitalopram (LEXAPRO) 20 MG tablet Take 1 tablet (20 mg total) by mouth daily. 30 tablet 2  . HYDROcodone-acetaminophen (NORCO/VICODIN) 5-325 MG per tablet Take 1-2 tablets by mouth every 6 (six) hours as needed for moderate pain. (Patient not taking: Reported on 08/22/2015) 40 tablet 0  . ibuprofen (ADVIL,MOTRIN) 200 MG tablet Take 400 mg by mouth every 6 (six) hours as needed for mild pain or moderate pain (for knee pain).    [name] LEUCOVORIN CALCIUM IV Inject into the vein every 14 (fourteen) days. To be started in near future    . lidocaine-prilocaine (EMLA) cream Apply a quarter size amount to port site 1 hour prior to chemo. Do not rub in. Cover with plastic wrap. 30 g 3  . lisinopril (PRINIVIL,ZESTRIL) 10 MG tablet Take 1 tablet (10 mg total) by mouth daily. 30 tablet 12  . omeprazole (PRILOSEC) 20 MG capsule Take 1 capsule (20 mg total) by mouth daily. 30 capsule 3  . ondansetron (ZOFRAN) 8 MG tablet Take 1 tablet (8 mg total) by mouth every 8 (eight) hours as needed. 30 tablet 3  . OXALIPLATIN IV Inject into the vein every 14 (fourteen) days. To start in near future    . potassium chloride SA (K-DUR,KLOR-CON) 20 MEQ tablet Take 1 tablet (20 mEq total) by mouth 2 (two) times daily. (Patient not taking: Reported on 08/05/2015) 14 tablet 0  . prochlorperazine (COMPAZINE) 10 MG tablet Take 1 tablet (10 mg total) by mouth every 6 (six) hours as needed for nausea or vomiting. 30 tablet 3  . sulfamethoxazole-trimethoprim (BACTRIM DS,SEPTRA DS) 800-160 MG per tablet Take 1 tablet by mouth 2 (two) times daily. (Patient not taking: Reported on 08/17/2015) 14 tablet 0   No current facility-administered medications on file prior to visit.    Past Surgical History  Procedure Laterality Date  . Vein ligation and  stripping      left leg  . Colonoscopy N/A 03/28/2015    SLF: 1. Abdominal pain, diarrhea, rectal bleeding due to obstructing colon mass  . Esophagogastroduodenoscopy N/A 03/28/2015    SLF: 1. dysphagia 2. Mild non-erosive gastritis.   . Esophageal dilation N/A 03/28/2015    Procedure: ESOPHAGEAL DILATION;  Surgeon: [name], MD;  Location: [facility];  Service: Endoscopy;  Laterality: N/A;  . Partial colectomy N/A 04/03/2015    Procedure: PARTIAL COLECTOMY;  Surgeon: [name], MD;  Location: [facility];  Service: General;  Laterality: N/A;  . Portacath placement N/A 05/10/2015    Procedure: INSERTION PORT-A-CATH;  Surgeon: [name], MD;  Location: [facility];  Service: General;  Laterality: N/A;  left subclavian  . Skin cancer destruction Left 08/16/15    left side of nose and left back    Denies any headaches, dizziness, double vision, fevers, chills, night sweats, nausea, vomiting, diarrhea,  constipation, chest pain, heart palpitations, shortness of breath, blood in stool, black tarry stool, urinary pain, urinary burning, urinary frequency, hematuria. Positive for dizziness, diarrhea 14 point review of systems was performed and is negative except as detailed under history of present illness and above   PHYSICAL EXAMINATION  ECOG PERFORMANCE STATUS: 1 - Symptomatic but completely ambulatory  Filed Vitals:   09/19/15 0857  BP: 101/72  Pulse: 87  Temp: 98.3 F (36.8 C)  Resp: 18    GENERAL:alert, no distress, well nourished, well developed, comfortable, cooperative, obese. He is smiling and very talkative SKIN: skin color, texture, turgor are normal, no rashes,  HEAD: Normocephalic, No masses, lesions, tenderness or abnormalities EYES: normal, PERRLA, EOMI, Conjunctiva are pink and non-injected. Mild left eye drainage. EARS: External ears normal OROPHARYNX:lips, buccal mucosa, and tongue normal and mucous membranes are moist  NECK: supple, thyroid normal size,  non-tender, without nodularity, trachea midline LYMPH:  not examined BREAST:not examined LUNGS: clear to auscultation  HEART: regular rate & rhythm ABDOMEN:abdomen soft, obese and normal bowel sounds BACK: Back symmetric, no curvature. EXTREMITIES:less then 2 second capillary refill, no joint deformities, effusion, or inflammation, no skin discoloration      Left lower extremity is larger than the right. (chronic) with minimal swelling NEURO: alert & oriented x 3 with fluent speech, no focal motor/sensory deficits CN II - XII appear grossly intact  LABORATORY DATA: I have reviewed the data as listed.  CBC    Component Value Date/Time   WBC 5.1 09/12/2015 0900   RBC 3.60* 09/12/2015 0900   HGB 12.8* 09/12/2015 0900   HCT 36.9* 09/12/2015 0900   PLT 134* 09/12/2015 0900   MCV 102.5* 09/12/2015 0900   MCH 35.6* 09/12/2015 0900   MCHC 34.7 09/12/2015 0900   RDW 17.3* 09/12/2015 0900   LYMPHSABS 1.2 09/12/2015 0900   MONOABS 0.8 09/12/2015 0900   EOSABS 0.1 09/12/2015 0900   BASOSABS 0.0 09/12/2015 0900      Chemistry      Component Value Date/Time   NA 137 09/12/2015 0900   K 3.9 09/12/2015 0900   CL 105 09/12/2015 0900   CO2 23 09/12/2015 0900   BUN 10 09/12/2015 0900   CREATININE 0.86 09/12/2015 0900      Component Value Date/Time   CALCIUM 8.6* 09/12/2015 0900   ALKPHOS 131* 09/12/2015 0900   AST 67* 09/12/2015 0900   ALT 40 09/12/2015 0900   BILITOT 2.5* 09/12/2015 0900      Lab Results  Component Value Date   CEA 3.0 04/27/2015   CLINICAL DATA: Subsequent encounter for colon cancer with elevated bilirubin  EXAM: CT ABDOMEN AND PELVIS WITH CONTRAST  TECHNIQUE: Multidetector CT imaging of the abdomen and pelvis was performed using the standard protocol following bolus administration of intravenous contrast.  CONTRAST: 151mL OMNIPAQUE IOHEXOL 300 MG/ML SOLN  COMPARISON: 03/29/2015.  FINDINGS: Lower chest: Unremarkable.  Hepatobiliary:  No focal abnormality within the liver parenchyma. The liver shows diffusely decreased attenuation suggesting steatosis. There is no evidence for gallstones, gallbladder wall thickening, or pericholecystic fluid. No intrahepatic or extrahepatic biliary dilation.  Pancreas: No focal mass lesion. No dilatation of the main duct. No intraparenchymal cyst. No peripancreatic edema.  Spleen: No splenomegaly. No focal mass lesion.  Adrenals/Urinary Tract: 15 mm left adrenal nodule, not substantially changed in the interval. This nodule has very low attenuation and is likely a benign adrenal adenoma. The right adrenal gland is normal in appearance. No enhancing lesion in either kidney. No  hydronephrosis. No evidence for hydroureter. The urinary bladder appears normal for the degree of distention.  Stomach/Bowel: Stomach is nondistended. No gastric wall thickening. No evidence of outlet obstruction. Duodenum diverticulum noted. No small bowel wall thickening. No small bowel dilatation. The terminal ileum is normal. The appendix is normal. Surgical suture line noted in the left colon in the region of the proximal sigmoid segment. Circumferential wall thickening is seen to a mild degree in the distal sigmoid colon and rectum. There is mild associated perirectal edema/ inflammation.  Vascular/Lymphatic: No abdominal aortic aneurysm. There is marked vascular collateralization in the left abdomen as before. The portal vein and superior mesenteric vein are patent. Splenic vein is patent.  There is no gastrohepatic or hepatoduodenal ligament lymphadenopathy. No intraperitoneal or retroperitoneal lymphadenopy. No pelvic sidewall lymphadenopathy.  Reproductive: The prostate gland and seminal vesicles have normal imaging features.  Other: No intraperitoneal free fluid.  Musculoskeletal: Small right inguinal hernia contains only fat. 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 Bone windows reveal no worrisome lytic or sclerotic osseous lesions.  IMPRESSION: 1. Interval development of mild circumferential wall thickening in the rectosigmoid junction and rectum with mild perirectal edema/inflammation. Infectious or inflammatory colitis could have this appearance. Radiation change could also have these features on CT. 2. Interval resection of proximal sigmoid colon mass. 3. No CT findings to suggest metastatic disease in the abdomen or pelvis. 4. Stable left adrenal nodule, most likely adenoma. 5. Persistent marked vascular collateralization in the left abdomen. There is some nodularity to the liver contour, but imaging features are not definitive for cirrhosis. The portal vein and superior mesenteric vein are patent. No associated splenomegaly.   Electronically Signed  By: Misty Stanley M.D.  On: 09/18/2015 17:29  ASSESSMENT AND PLAN:  Stage II colorectal cancer Adjuvant FOLFOX Family history of colon cancer Situational depression/anxiety Treatment related fatigue Elevated AST  I reviewed all of his imaging studies including his echocardiogram. I discussed the findings on CT regarding the rectosigmoid area, the patient has an appointment with Dr. fields in the morning. We also discussed his elevated bilirubin and findings on CT and ultrasound regarding his liver.  I emphasized to the patient I do not feel proceeding with additional chemotherapy is in his best interest. I again tried to reassure him that he is a stage II colon cancer. He has a high likelihood of cure with surgery alone. I again reemphasized the marginal benefit of chemotherapy in stage II disease.  He has a lot of fear of his cancer "coming back." He and his wife agree however that proceeding is no longer in his best interest.  I have arranged for a head MRI given his persistent dizziness. I will plan on seeing the patient next week. We will arrange for a referral back to  physical therapy.  All questions were answered. The patient knows to call the clinic with any problems, questions or concerns. We can certainly see the patient much sooner if necessary.   This document serves as a record of services personally performed by Ancil Linsey, MD. It was created on her behalf by Janace Hoard, a trained medical scribe. The creation of this record is based on the scribe's personal observations and the provider's statements to them. This document has been checked and approved by the attending provider.  I have reviewed the above documentation for accuracy and completeness, and I agree with the above.  This note was electronically signed.  Peter Fam. Whitney Muse, MD

## 2015-09-19 NOTE — Telephone Encounter (Signed)
LMOM to call. New lab orders faxed to [facility]. Direct Bilirubin added to labs done today. Routing to [facility] to schedule the flex sig.

## 2015-09-19 NOTE — Patient Instructions (Addendum)
[place] [facility] Discharge Instructions  RECOMMENDATIONS MADE BY THE CONSULTANT AND ANY TEST RESULTS WILL BE SENT TO YOUR REFERRING PHYSICIAN.  Exam and discussion today with Dr. Whitney Muse. See Dr. Oneida Alar as scheduled tomorrow. MRI of brain as scheduled.  Office visit with Dr. Whitney Muse in one week after MRI.  Thank you for choosing [place] [facility] to provide your oncology and hematology care.  To afford each patient quality time with our provider, please arrive at least 15 minutes before your scheduled appointment time.    You need to re-schedule your appointment should you arrive 10 or more minutes late.  We strive to give you quality time with our providers, and arriving late affects you and other patients whose appointments are after yours.  Also, if you no show three or more times for appointments you may be dismissed from the clinic at the providers discretion.     Again, thank you for choosing [facility].  Our hope is that these requests will decrease the amount of time that you wait before being seen by our physicians.       _____________________________________________________________  Should you have questions after your visit to [facility], please contact our office at [phone] between the hours of 8:30 a.m. and 4:30 p.m.  Voicemails left after 4:30 p.m. will not be returned until the following business day.  For prescription refill requests, have your pharmacy contact our office.

## 2015-09-19 NOTE — Telephone Encounter (Signed)
PLEASE CALL PT. HE HAS ELEVATED LIVER ENZYMES. HE NEEDS SEROLOGIES FOR PBC/AIH/FASTING. IF NEGATIVE HE WILL NEED A LIVER BIOPSY. NEEDS FLEX SIG TO EVALUATE CONSTIPATION AND THICKENED RECTUM ON CT WITHIN THE NEXT 2 WEEKS. ADD ON DIRECT BILIRUBIN TO LABS FROM SEP 20.

## 2015-09-19 NOTE — Progress Notes (Signed)
LABS DRAWN

## 2015-09-19 NOTE — Telephone Encounter (Signed)
Pt's wife called and was informed of labs by me and Candy spoke to her about the flex sig.

## 2015-09-19 NOTE — Telephone Encounter (Signed)
Flex sig is set for 10/02/2015 @ 1:00pm  Mailing instructions and will call pt.

## 2015-09-20 ENCOUNTER — Other Ambulatory Visit: Payer: Self-pay

## 2015-09-20 ENCOUNTER — Encounter: Payer: Self-pay | Admitting: Gastroenterology

## 2015-09-20 ENCOUNTER — Telehealth: Payer: Self-pay

## 2015-09-20 ENCOUNTER — Ambulatory Visit (INDEPENDENT_AMBULATORY_CARE_PROVIDER_SITE_OTHER): Payer: Medicare Other | Admitting: Gastroenterology

## 2015-09-20 ENCOUNTER — Other Ambulatory Visit (HOSPITAL_COMMUNITY)
Admission: RE | Admit: 2015-09-20 | Discharge: 2015-09-20 | Disposition: A | Discharge status: Home / Self Care | Payer: Medicare Other | Source: Skilled Nursing Facility | Attending: Gastroenterology | Admitting: Gastroenterology

## 2015-09-20 VITALS — BP 108/75 | HR 98 | Temp 97.6°F | Ht 67.0 in

## 2015-09-20 DIAGNOSIS — K5901 Slow transit constipation: Secondary | ICD-10-CM

## 2015-09-20 DIAGNOSIS — R748 Abnormal levels of other serum enzymes: Secondary | ICD-10-CM | POA: Diagnosis not present

## 2015-09-20 DIAGNOSIS — C189 Malignant neoplasm of colon, unspecified: Secondary | ICD-10-CM

## 2015-09-20 DIAGNOSIS — K5900 Constipation, unspecified: Secondary | ICD-10-CM | POA: Insufficient documentation

## 2015-09-20 DIAGNOSIS — R194 Change in bowel habit: Secondary | ICD-10-CM

## 2015-09-20 LAB — BILIRUBIN, DIRECT: Bilirubin, Direct: 0.6 mg/dL — ABNORMAL HIGH (ref 0.1–0.5)

## 2015-09-20 NOTE — Telephone Encounter (Signed)
Per Dr. Oneida Alar, Dr. Harl Bowie said [facility] for the pt to hold Eloquis on Oct 1 and Oct 2 prior to procedure on Oct 3.  Pt's wife is aware.

## 2015-09-20 NOTE — Patient Instructions (Signed)
COMPLETE LABS TODAY.  DRINK WATER TO KEEP YOUR URINE LIGHT YELLOW.  FOLLOW A LOW FAT DIET. MEATS SHOULD BE BAKED, BROILED, OR BOILED. AVOID FRIED FOODS. SEE INFO BELOW.  USE FIBER POWDER 1 PACKET ONCE DAILY FOR 3 DAYS THEN TWICE DAILY FOR 3 DAYS THEN THREE TIMES A DAY.  TAKE SUPREP OCT 3 AND FOLLOW A CLEAR LIQUID DIET. YOU MAY HAVE CREAMY SOUPS WITHOUT CHUNKS OF FOOD, SCRAMBLED EGGS OR MASHED POTATOES IF YOU GET HUNGRY.  COMPLETE COLONOSCOPY OCT 3.  RESCHEDULE YOUR MRI.  FOLLOW UP IN 6 MOS.   Low-Fat Diet BREADS, CEREALS, PASTA, RICE, DRIED PEAS, AND BEANS These products are high in carbohydrates and most are low in fat. Therefore, they can be increased in the diet as substitutes for fatty foods. They too, however, contain calories and should not be eaten in excess. Cereals can be eaten for snacks as well as for breakfast.   FRUITS AND VEGETABLES It is good to eat fruits and vegetables. Besides being sources of fiber, both are rich in vitamins and some minerals. They help you get the daily allowances of these nutrients. Fruits and vegetables can be used for snacks and desserts.  MEATS Limit lean meat, chicken, [place], and fish to no more than 6 ounces per day. Beef, Pork, and Lamb Use lean cuts of beef, pork, and lamb. Lean cuts include:  Extra-lean ground beef.  Arm roast.  Sirloin tip.  Center-cut ham.  Round steak.  Loin chops.  Rump roast.  Tenderloin.  Trim all fat off the outside of meats before cooking. It is not necessary to severely decrease the intake of red meat, but lean choices should be made. Lean meat is rich in protein and contains a highly absorbable form of iron. Premenopausal women, in particular, should avoid reducing lean red meat because this could increase the risk for low red blood cells (iron-deficiency anemia).  Chicken and [place] These are good sources of protein. The fat of poultry can be reduced by removing the skin and underlying fat layers before  cooking. Chicken and [place] can be substituted for lean red meat in the diet. Poultry should not be fried or covered with high-fat sauces. Fish and Shellfish Fish is a good source of protein. Shellfish contain cholesterol, but they usually are low in saturated fatty acids. The preparation of fish is important. Like chicken and [place], they should not be fried or covered with high-fat sauces. EGGS Egg whites contain no fat or cholesterol. They can be eaten often. Try 1 to 2 egg whites instead of whole eggs in recipes or use egg substitutes that do not contain yolk. MILK AND DAIRY PRODUCTS Use skim or 1% milk instead of 2% or whole milk. Decrease whole milk, natural, and processed cheeses. Use nonfat or low-fat (2%) cottage cheese or low-fat cheeses made from vegetable oils. Choose nonfat or low-fat (1 to 2%) yogurt. Experiment with evaporated skim milk in recipes that call for heavy cream. Substitute low-fat yogurt or low-fat cottage cheese for sour cream in dips and salad dressings. Have at least 2 servings of low-fat dairy products, such as 2 glasses of skim (or 1%) milk each day to help get your daily calcium intake. FATS AND OILS Reduce the total intake of fats, especially saturated fat. Butterfat, lard, and beef fats are high in saturated fat and cholesterol. These should be avoided as much as possible. Vegetable fats do not contain cholesterol, but certain vegetable fats, such as coconut oil, palm oil, and palm  kernel oil are very high in saturated fats. These should be limited. These fats are often used in bakery goods, processed foods, popcorn, oils, and nondairy creamers. Vegetable shortenings and some peanut butters contain hydrogenated oils, which are also saturated fats. Read the labels on these foods and check for saturated vegetable oils. Unsaturated vegetable oils and fats do not raise blood cholesterol. However, they should be limited because they are fats and are high in calories. Total fat  should still be limited to 30% of your daily caloric intake. Desirable liquid vegetable oils are corn oil, cottonseed oil, olive oil, canola oil, safflower oil, soybean oil, and sunflower oil. Peanut oil is not as good, but small amounts are acceptable. Buy a heart-healthy tub margarine that has no partially hydrogenated oils in the ingredients. Mayonnaise and salad dressings often are made from unsaturated fats, but they should also be limited because of their high calorie and fat content. Seeds, nuts, peanut butter, olives, and avocados are high in fat, but the fat is mainly the unsaturated type. These foods should be limited mainly to avoid excess calories and fat. OTHER EATING TIPS Snacks  Most sweets should be limited as snacks. They tend to be rich in calories and fats, and their caloric content outweighs their nutritional value. Some good choices in snacks are graham crackers, melba toast, soda crackers, bagels (no egg), English muffins, fruits, and vegetables. These snacks are preferable to snack crackers, [place] fries, TORTILLA CHIPS, and POTATO chips. Popcorn should be air-popped or cooked in small amounts of liquid vegetable oil. Desserts Eat fruit, low-fat yogurt, and fruit ices instead of pastries, cake, and cookies. Sherbet, angel food cake, gelatin dessert, frozen low-fat yogurt, or other frozen products that do not contain saturated fat (pure fruit juice bars, frozen ice pops) are also acceptable.  COOKING METHODS Choose those methods that use little or no fat. They include: Poaching.  Braising.  Steaming.  Grilling.  Baking.  Stir-frying.  Broiling.  Microwaving.  Foods can be cooked in a nonstick pan without added fat, or use a nonfat cooking spray in regular cookware. Limit fried foods and avoid frying in saturated fat. Add moisture to lean meats by using water, broth, cooking wines, and other nonfat or low-fat sauces along with the cooking methods mentioned above. Soups and  stews should be chilled after cooking. The fat that forms on top after a few hours in the refrigerator should be skimmed off. When preparing meals, avoid using excess salt. Salt can contribute to raising blood pressure in some people.  EATING AWAY FROM HOME Order entres, potatoes, and vegetables without sauces or butter. When meat exceeds the size of a deck of cards (3 to 4 ounces), the rest can be taken home for another meal. Choose vegetable or fruit salads and ask for low-calorie salad dressings to be served on the side. Use dressings sparingly. Limit high-fat toppings, such as bacon, crumbled eggs, cheese, sunflower seeds, and olives. Ask for heart-healthy tub margarine instead of butter.

## 2015-09-20 NOTE — Assessment & Plan Note (Addendum)
SYMPTOMS NOT CONTROLLED AND LIKELY DUE TO DECREASED PO INTAKE, LESS LIKELY RECURRENT RECTAL CANCER, OR ANASTOMOTIC STRICTURE.  DRINK WATER TO KEEP YOUR URINE LIGHT YELLOW. FOLLOW A LOW FAT DIET. MEATS SHOULD BE BAKED, BROILED, OR BOILED. AVOID FRIED FOODS. SEE INFO BELOW. USE FIBER POWDER 1 PACKET ONCE DAILY FOR 3 DAYS THEN TWICE DAILY FOR 3 DAYS THEN THREE TIMES A DAY. TAKE SUPREP OCT 3 AND FOLLOW A CLEAR LIQUID DIET. YOU MAY HAVE CREAMY SOUPS WITHOUT CHUNKS OF FOOD, SCRAMBLED EGGS OR MASHED POTATOES IF YOU GET HUNGRY. WILL DISCUSS HOW LONG TO HOLD ELIQUIS WITH CARDIOLOGY-24 OR 48 HRS. COMPLETE COLONOSCOPY OCT 3. RESCHEDULE  MRI.  FOLLOW UP IN 6 MOS.

## 2015-09-20 NOTE — Progress Notes (Signed)
CC'D TO PCP °

## 2015-09-20 NOTE — Progress Notes (Signed)
Subjective:    [name], male    DOB: [birth year], 69 y.o.   MRN: [id number] Peter Bogus, MD  HPI TROUBLE HAVING A BM AND ELEVATED LIVER ENZYMES. LAST SEEN MAR 2016 AND HAD INCOMPLETE TCS DUE TO OBSTRUCTING DESCENDING COLON MASS RESULTING IN LEFT COLECTOMY. S/P CHEMO W/O XRT. LAST DOSE 2 WEEKS AGO.  BMs: WAS ONCE A DAY ABUT NOT JUST A LITTLE. HAVING TO STRAIN. HAD HEMORRHOIDS ON LAST TCS. NOW USING CPAP NOW. RECENTLY DIAGNOSED WITH MELANOMA AND NON-MELANOMA. LESIONS REMOVED ON BACK AND LEFT FACE. HIS CRACK IS SORE. BURNING AFTER BM. SOILING: EVERY DAY(NEW) NO RECTAL PRESSUREOR ITCHING.  PT DENIES FEVER, CHILLS, nausea, vomiting, melena, diarrhea, CHEST PAIN, SHORTNESS OF BREATH,  abdominal pain, problems swallowing, problems with sedation, OR heartburn or indigestion.    Past Medical History  Diagnosis Date  . Varicose veins   . New onset atrial fibrillation 01/02/2015  . Morbid obesity 01/02/2015  . Hypertension   . Dysrhythmia     Afib- on set 12/2014  . Sleep apnea     been tested but has not received the CPAP yet  . GERD (gastroesophageal reflux disease)   . History of gout   . Cancer     colon  . Family history of colon cancer   . Family history of breast cancer in mother    Past Surgical History  Procedure Laterality Date  . Vein ligation and stripping      left leg  . Colonoscopy N/A 03/28/2015    SLF: 1. Abdominal pain, diarrhea, rectal bleeding due to obstructing colon mass  . Esophagogastroduodenoscopy N/A 03/28/2015    SLF: 1. dysphagia 2. Mild non-erosive gastritis.   . Esophageal dilation N/A 03/28/2015    Procedure: ESOPHAGEAL DILATION;  Surgeon: [name], MD;  [facility];  Service: Endoscopy;  Laterality: N/A;  . Partial colectomy N/A 04/03/2015    Procedure: PARTIAL COLECTOMY;  Surgeon: [name], MD;  Location: [facility];  Service: General;  Laterality: N/A;  . Portacath placement N/A 05/10/2015    Procedure: INSERTION  PORT-A-CATH;  Surgeon: [name], MD;  Location: [facility];  Service: General;  Laterality: N/A;  left subclavian  . Skin cancer destruction Left 08/16/15    left side of nose and left back    No Known Allergies  Current Outpatient Prescriptions  Medication Sig Dispense Refill  . apixaban (ELIQUIS) 5 MG TABS tablet Take 5 mg by mouth daily.    .      . ciprofloxacin (CILOXAN) 0.3 % ophthalmic solution Administer 1 drop, every 2 hours, while awake, for 2 days. Then 1 drop, every 4 hours, while awake, for the next 5 days.    .      . escitalopram (LEXAPRO) 20 MG tablet Take 1 tablet (20 mg total) by mouth daily.    .      .      . LEUCOVORIN CALCIUM IV Inject into the vein every 14 (fourteen) days. To be started in near future STOPPED YESTERDAY   . lidocaine-prilocaine (EMLA) cream Apply a quarter size amount to port site 1 hour prior to chemo. Do not rub in. Cover with plastic wrap. FOR HIS PORT   . lisinopril (PRINIVIL,ZESTRIL) 10 MG tablet Take 1 tablet (10 mg total) by mouth daily.    [name] omeprazole (PRILOSEC) 20 MG capsule Take 1 capsule (20 mg total) by mouth daily.    . ondansetron (ZOFRAN) 8 MG  tablet Take 1 tablet (8 mg total) by mouth every 8 (eight) hours as needed.    . OXALIPLATIN IV Inject into the vein every 14 (fourteen) days. To start in near future NONE FOR 2 WEEKS   .      .      .          Family History  Problem Relation Age of Onset  . Breast cancer Mother     dx under 84  . Colon cancer Mother     dx in her mid 11s  . Heart disease Father   . Hyperlipidemia Father   . Liver cancer Father     heavy ETOH user when young  . Cancer Sister     slow "blood" cancer  . Diabetes Sister   . Diabetes Brother   . Cancer Maternal Aunt     2-3 maternal aunts with Cancer NOS  . Lung cancer Paternal Uncle     three uncles with lung cancer - all smokers  . Cancer Paternal Grandmother     possible bone cancer   Social History   Social History  . Marital Status:  Married    Spouse Name: [name]  . Number of Children: 2  . Years of Education: N/A   Occupational History  . Not on file.   Social History Main Topics  . Smoking status: Never Smoker   . Smokeless tobacco: Never Used  . Alcohol Use: No  . Drug Use: No  . Sexual Activity: No   Other Topics Concern  . Not on file   Social History Narrative   Review of Systems PER HPI OTHERWISE ALL SYSTEMS ARE NEGATIVE.    Objective:   Physical Exam  Constitutional: He is oriented to person, place, and time. He appears well-developed and well-nourished. No distress.  HENT:  Head: Normocephalic and atraumatic.  Mouth/Throat: Oropharynx is clear and moist. No oropharyngeal exudate.  Eyes: Pupils are equal, round, and reactive to light. No scleral icterus.  Neck: Normal range of motion. Neck supple.  Cardiovascular: Normal rate, regular rhythm and normal heart sounds.   Pulmonary/Chest: Effort normal and breath sounds normal. No respiratory distress.  Abdominal: Soft. Bowel sounds are normal. He exhibits no distension. There is no tenderness.  EXAM [facility]   Musculoskeletal: He exhibits edema (2-3+ BIL LEs).  Lymphadenopathy:    He has no cervical adenopathy.  Neurological: He is alert and oriented to person, place, and time.  NO  NEW FOCAL DEFICITS   Psychiatric: He has a normal mood and affect.  Vitals reviewed.         Assessment & Plan:

## 2015-09-20 NOTE — Assessment & Plan Note (Signed)
S/P CHEMO/NO XRT. INCOMPLETE TCS INMAR 2016.  COMPLETE TCS OCT 3-SUPREP SAMPLE GIVEN. DISCUSSED PROCEDURE, BENEFITS, & RISKS: < 1% chance of medication reaction, bleeding, perforation, or rupture of spleen/liver.

## 2015-09-20 NOTE — Progress Notes (Signed)
SPOKE WITH DR. BRANCH. HOLD ELIQUS FOR 48 HRS. PT INSTRUCTED TO HOLD ELIQUIS OCT 1 AND 2 FOR TCS OCT 3.

## 2015-09-20 NOTE — Telephone Encounter (Signed)
REVIEWED-NO ADDITIONAL RECOMMENDATIONS. 

## 2015-09-20 NOTE — Assessment & Plan Note (Signed)
NEW FINDING ON EVALUATION-RECENT ECHO/CT ABD: FATTY LIVER, NL RA/RV, MILDLY ELEVATED PA PRESSURE.  COMPLETE SEROLOGIES FOR AIH/PBC.

## 2015-09-21 ENCOUNTER — Encounter (HOSPITAL_COMMUNITY): Payer: Medicare Other

## 2015-09-21 LAB — BILIRUBIN, DIRECT: Bilirubin, Direct: 0.9 mg/dL — ABNORMAL HIGH (ref <=0.2)

## 2015-09-21 LAB — ANA: ANA: NEGATIVE

## 2015-09-21 LAB — IGG, IGA, IGM
IGM, SERUM: 65 mg/dL (ref 41–251)
IgA: 804 mg/dL — ABNORMAL HIGH (ref 68–379)
IgG (Immunoglobin G), Serum: 1800 mg/dL — ABNORMAL HIGH (ref 650–1600)

## 2015-09-21 LAB — MITOCHONDRIAL ANTIBODIES: MITOCHONDRIAL M2 AB, IGG: 0.3 (ref <0.91)

## 2015-09-21 LAB — FERRITIN: Ferritin: 298 ng/mL (ref 22–322)

## 2015-09-22 LAB — ANTI-SMOOTH MUSCLE ANTIBODY, IGG: SMOOTH MUSCLE AB: 21 U — AB (ref <20)

## 2015-09-26 ENCOUNTER — Inpatient Hospital Stay (HOSPITAL_COMMUNITY): Payer: Medicare Other

## 2015-09-26 ENCOUNTER — Ambulatory Visit (HOSPITAL_COMMUNITY): Payer: Medicare Other | Admitting: Hematology & Oncology

## 2015-09-27 ENCOUNTER — Telehealth: Payer: Self-pay | Admitting: Gastroenterology

## 2015-09-27 NOTE — Telephone Encounter (Addendum)
REVIEWED RESULTS. ASMA NOT CLINICALLY SIGNIFICANT. PREDOMINANTLY AN INDIRECT HYPERBILIRUBINEMIA & GILBERTS SYNDROME IS THE MOST LIKELY THE ETIOLOGY, LESS LIKELY HEMOLYSIS. AST ELEVATION MAY BE DUE TO FATTY LIVER. VALUE < 2XULN AND NOT CLINICALLY SIGNIFICANT. DISCUSSed WITH DR. Whitney Muse.

## 2015-09-28 ENCOUNTER — Encounter (HOSPITAL_BASED_OUTPATIENT_CLINIC_OR_DEPARTMENT_OTHER): Payer: Medicare Other | Admitting: Hematology & Oncology

## 2015-09-28 ENCOUNTER — Encounter (HOSPITAL_COMMUNITY): Payer: Self-pay | Admitting: Hematology & Oncology

## 2015-09-28 ENCOUNTER — Encounter (HOSPITAL_COMMUNITY): Payer: Medicare Other

## 2015-09-28 VITALS — BP 110/62 | HR 88 | Temp 98.3°F | Resp 18 | Wt 268.3 lb

## 2015-09-28 DIAGNOSIS — R42 Dizziness and giddiness: Secondary | ICD-10-CM | POA: Diagnosis not present

## 2015-09-28 DIAGNOSIS — C189 Malignant neoplasm of colon, unspecified: Secondary | ICD-10-CM

## 2015-09-28 DIAGNOSIS — R296 Repeated falls: Secondary | ICD-10-CM | POA: Diagnosis not present

## 2015-09-28 NOTE — Progress Notes (Signed)
Peter Bogus, MD [address] [place] [id number]  Stage IIA adenocarcinoma of the colon  CURRENT THERAPY: Observation INTERVAL HISTORY: [name] 69 y.o. male returns for followup of Stage IIA adenocarcinoma of colon, S/P definitive surgery by Dr. Arnoldo Morale on 04/03/2015. He completed 7 cycles of adjuvant FOLFOX.  He did fairly well with therapy until a few weeks ago. He developed an elevated bilirubin of uncertain etiology. Chemotherapy was held. An ultrasound of the liver was performed on 09/12/2015 that showed diffuse increase in liver echogenicity. This was felt to be most likely secondary to hepatic steatosis. CT of the abdomen and pelvis was performed on 09/18/2015 that showed circumferential wall thickening in the rectosigmoid junction and rectum with mild perirectal edema and inflammation. No evidence of metastatic disease. Stable left adrenal nodule. Persistent marked vascular collateralization in the left abdomen and nodularity to the liver contour.  The patient has not been feeling well that last few weeks.  He has been weak and has not had bowel control. He states he thinks he is constipated but then he cannot make it "to the toilet." He reports some blood in his stool. His wife states that he is dizzy.  He notes that this has been occurring most of the time on a regular basis and all of the time the past few weeks.  He fell again last night. His wife states she thinks at times his speech is slurred. He is present today in the wheelchair.   The patient has seen Dr. Oneida Alar.  He has scheduled a colonoscopy Monday and an MRI Thursday.  He has been having frequent bowel movements every hour.  He notes that they are loose.  The patient has not been doing much walking.  He cannot get around alone.  His wife helps him. She believes that the patient's balance issues may stem from a head or neck issue from his frequent falling instead of a stroke.  He has  fallen 3 times since his last visit.  He has a walker at home but does not prefer to use it.  Dizziness is an ongoing problem.      Colon carcinoma   03/28/2015 Initial Biopsy Diagnosis 1. Colon, biopsy, left descending - INVASIVE ADENOCARCINOMA. 2. Stomach, biopsy - GASTRIC BODY AND ANTRAL-TYPE MUCOSA WITH ASSOCIATED MINIMAL CHRONIC INFLAMMATION. - NO EVIDENCE OF HELICOBACTER PYLORI, INTESTINAL METAPLASIA, DYSPLASIA OR MA   03/29/2015 Imaging CT abd/pelvis- Circumferential mucosal lesion the proximal sigmoid colon consists with colorectal carcinoma.   04/03/2015 Definitive Surgery Colon, segmental resection for tumor - INVASIVE ADENOCARCINOMA, INVADING THROUGH THE MUSCULARIS PROPRIA INTO PERICOLONIC FATTY TISSUE. - FOURTEEN LYMPH NODES, NEGATIVE FOR METASTATIC CARCINOMA (0/14). - RESECTION MARGINS, NEGATIVE FOR ATYPIA OR MALIGNA   05/16/2015 -  Chemotherapy FOLFOX    05/31/2015 Treatment Plan Change Oxaliplatin dose reduced by 20% secondary to fatigue from cycle 1 of therapy.   07/24/2015 Treatment Plan Change 5 FU bolus D/C'd for cycle 5 and subsequent cycles due to thrombocytopenia.     Past Medical History  Diagnosis Date  . Varicose veins   . New onset atrial fibrillation 01/02/2015  . Morbid obesity 01/02/2015  . Hypertension   . Dysrhythmia     Afib- on set 12/2014  . Sleep apnea     been tested but has not received the CPAP yet  . GERD (gastroesophageal reflux disease)   . History of gout   . Cancer  colon  . Family history of colon cancer   . Family history of breast cancer in mother     has Varicose veins of lower extremities with other complications; New onset atrial fibrillation; Morbid obesity; Melena; Heme positive stool; Esophageal dysphagia; Diarrhea; Occult blood in stools; Dysphagia, pharyngoesophageal phase; Colon carcinoma; Family history of colon cancer; Family history of breast cancer in mother; Cellulitis; Constipation; and Elevated liver enzymes on his problem list.     He believes that his sister died of heart issues, she died in her sleep.   has No Known Allergies.  Current Outpatient Prescriptions on File Prior to Visit  Medication Sig Dispense Refill  . apixaban (ELIQUIS) 5 MG TABS tablet Take 5 mg by mouth daily.    . ciprofloxacin (CILOXAN) 0.3 % ophthalmic solution Administer 1 drop, every 2 hours, while awake, for 2 days. Then 1 drop, every 4 hours, while awake, for the next 5 days. 5 mL 0  . escitalopram (LEXAPRO) 20 MG tablet Take 1 tablet (20 mg total) by mouth daily. 30 tablet 2  . lisinopril (PRINIVIL,ZESTRIL) 10 MG tablet Take 1 tablet (10 mg total) by mouth daily. 30 tablet 12  . omeprazole (PRILOSEC) 20 MG capsule Take 1 capsule (20 mg total) by mouth daily. 30 capsule 3  . psyllium (METAMUCIL) 58.6 % packet Take 1 packet by mouth daily.    [name] HYDROcodone-acetaminophen (NORCO/VICODIN) 5-325 MG per tablet Take 1-2 tablets by mouth every 6 (six) hours as needed for moderate pain. (Patient not taking: Reported on 09/28/2015) 40 tablet 0  . ibuprofen (ADVIL,MOTRIN) 200 MG tablet Take 400 mg by mouth every 6 (six) hours as needed for mild pain or moderate pain (for knee pain).    [name] lidocaine-prilocaine (EMLA) cream Apply a quarter size amount to port site 1 hour prior to chemo. Do not rub in. Cover with plastic wrap. (Patient not taking: Reported on 09/25/2015) 30 g 3  . ondansetron (ZOFRAN) 8 MG tablet Take 1 tablet (8 mg total) by mouth every 8 (eight) hours as needed. (Patient not taking: Reported on 09/28/2015) 30 tablet 3  . potassium chloride SA (K-DUR,KLOR-CON) 20 MEQ tablet Take 1 tablet (20 mEq total) by mouth 2 (two) times daily. (Patient not taking: Reported on 08/05/2015) 14 tablet 0  . prochlorperazine (COMPAZINE) 10 MG tablet Take 1 tablet (10 mg total) by mouth every 6 (six) hours as needed for nausea or vomiting. (Patient not taking: Reported on 09/20/2015) 30 tablet 3   No current facility-administered medications on file prior to visit.     Past Surgical History  Procedure Laterality Date  . Vein ligation and stripping      left leg  . Colonoscopy N/A 03/28/2015    SLF: 1. Abdominal pain, diarrhea, rectal bleeding due to obstructing colon mass  . Esophagogastroduodenoscopy N/A 03/28/2015    SLF: 1. dysphagia 2. Mild non-erosive gastritis.   . Esophageal dilation N/A 03/28/2015    Procedure: ESOPHAGEAL DILATION;  Surgeon: [name], MD;  [facility];  Service: Endoscopy;  Laterality: N/A;  . Partial colectomy N/A 04/03/2015    Procedure: PARTIAL COLECTOMY;  Surgeon: [name], MD;  Location: [facility];  Service: General;  Laterality: N/A;  . Portacath placement N/A 05/10/2015    Procedure: INSERTION PORT-A-CATH;  Surgeon: [name], MD;  Location: [facility];  Service: General;  Laterality: N/A;  left subclavian  . Skin cancer destruction Left 08/16/15    left side of nose and left  back    Denies any headaches, dizziness, double vision, fevers, chills, night sweats, nausea, vomiting, diarrhea, constipation, chest pain, heart palpitations, shortness of breath, blood in stool, black tarry stool, urinary pain, urinary burning, urinary frequency, hematuria. Positive for dizziness, diarrhea 14 point review of systems was performed and is negative except as detailed under history of present illness and above    PHYSICAL EXAMINATION  ECOG PERFORMANCE STATUS: 1 - Symptomatic but completely ambulatory  Filed Vitals:   09/28/15 0825  BP: 110/62  Pulse: 88  Temp: 98.3 F (36.8 C)  Resp: 18    GENERAL:alert, no distress, well nourished, well developed,, cooperative, obese. He is smiling and very talkative he appears fatigued. SKIN:  texture, turgor are normal, no rashes, +icterus HEAD: Normocephalic, No masses, lesions, tenderness or abnormalities EYES: normal, PERRLA, EOMI, Conjunctiva are pink and non-injected. Mild left eye drainage. EARS: External ears normal OROPHARYNX:lips, buccal mucosa, and  tongue normal and mucous membranes are moist  NECK: supple, thyroid normal size, non-tender, without nodularity, trachea midline LYMPH:  not examined BREAST:not examined LUNGS: clear to auscultation  HEART: regular rate & rhythm ABDOMEN:abdomen soft, obese and normal bowel sounds BACK: Back symmetric, no curvature. EXTREMITIES:less then 2 second capillary refill, no joint deformities, effusion, or inflammation, no skin discoloration      Left lower extremity is larger than the right. (chronic) with minimal swelling NEURO: alert & oriented x 3 with fluent speech, no focal motor/sensory deficits CN II - XII appear grossly intact  LABORATORY DATA: I have reviewed the data as listed.  CBC    Component Value Date/Time   WBC 8.8 09/19/2015 0901   RBC 3.60* 09/19/2015 0901   HGB 12.5* 09/19/2015 0901   HCT 36.9* 09/19/2015 0901   PLT SPECIMEN CHECKED FOR CLOTS 09/19/2015 0901   MCV 102.5* 09/19/2015 0901   MCH 34.7* 09/19/2015 0901   MCHC 33.9 09/19/2015 0901   RDW 16.0* 09/19/2015 0901   LYMPHSABS 1.1 09/19/2015 0901   MONOABS 1.1* 09/19/2015 0901   EOSABS 0.2 09/19/2015 0901   BASOSABS 0.0 09/19/2015 0901      Chemistry      Component Value Date/Time   NA 136 09/19/2015 0901   K 4.0 09/19/2015 0901   CL 105 09/19/2015 0901   CO2 25 09/19/2015 0901   BUN 7 09/19/2015 0901   CREATININE 0.89 09/19/2015 0901      Component Value Date/Time   CALCIUM 8.2* 09/19/2015 0901   ALKPHOS 114 09/19/2015 0901   AST 54* 09/19/2015 0901   ALT 31 09/19/2015 0901   BILITOT 3.2* 09/19/2015 0901      Lab Results  Component Value Date   CEA 3.0 04/27/2015   CLINICAL DATA: Subsequent encounter for colon cancer with elevated bilirubin  EXAM: CT ABDOMEN AND PELVIS WITH CONTRAST  TECHNIQUE: Multidetector CT imaging of the abdomen and pelvis was performed using the standard protocol following bolus administration of intravenous contrast.  CONTRAST: 115mL OMNIPAQUE IOHEXOL 300  MG/ML SOLN  COMPARISON: 03/29/2015.  FINDINGS: Lower chest: Unremarkable.  Hepatobiliary: No focal abnormality within the liver parenchyma. The liver shows diffusely decreased attenuation suggesting steatosis. There is no evidence for gallstones, gallbladder wall thickening, or pericholecystic fluid. No intrahepatic or extrahepatic biliary dilation.  Pancreas: No focal mass lesion. No dilatation of the main duct. No intraparenchymal cyst. No peripancreatic edema.  Spleen: No splenomegaly. No focal mass lesion.  Adrenals/Urinary Tract: 15 mm left adrenal nodule, not substantially changed in the interval. This nodule has very low attenuation  and is likely a benign adrenal adenoma. The right adrenal gland is normal in appearance. No enhancing lesion in either kidney. No hydronephrosis. No evidence for hydroureter. The urinary bladder appears normal for the degree of distention.  Stomach/Bowel: Stomach is nondistended. No gastric wall thickening. No evidence of outlet obstruction. Duodenum diverticulum noted. No small bowel wall thickening. No small bowel dilatation. The terminal ileum is normal. The appendix is normal. Surgical suture line noted in the left colon in the region of the proximal sigmoid segment. Circumferential wall thickening is seen to a mild degree in the distal sigmoid colon and rectum. There is mild associated perirectal edema/ inflammation.  Vascular/Lymphatic: No abdominal aortic aneurysm. There is marked vascular collateralization in the left abdomen as before. The portal vein and superior mesenteric vein are patent. Splenic vein is patent.  There is no gastrohepatic or hepatoduodenal ligament lymphadenopathy. No intraperitoneal or retroperitoneal lymphadenopy. No pelvic sidewall lymphadenopathy.  Reproductive: The prostate gland and seminal vesicles have normal imaging features.  Other: No intraperitoneal free fluid.  Musculoskeletal:  Small right inguinal hernia contains only fat. 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 Bone windows reveal no worrisome lytic or sclerotic osseous lesions.  IMPRESSION: 1. Interval development of mild circumferential wall thickening in the rectosigmoid junction and rectum with mild perirectal edema/inflammation. Infectious or inflammatory colitis could have this appearance. Radiation change could also have these features on CT. 2. Interval resection of proximal sigmoid colon mass. 3. No CT findings to suggest metastatic disease in the abdomen or pelvis. 4. Stable left adrenal nodule, most likely adenoma. 5. Persistent marked vascular collateralization in the left abdomen. There is some nodularity to the liver contour, but imaging features are not definitive for cirrhosis. The portal vein and superior mesenteric vein are patent. No associated splenomegaly.   Electronically Signed  By: Misty Stanley M.D.  On: 09/18/2015 17:29  ASSESSMENT AND PLAN:  Stage II colorectal cancer Adjuvant FOLFOX Family history of colon cancer Situational depression/anxiety Treatment related fatigue Elevated AST Hyperbilirubinemia, predominantly indirect Dizziness/Falls  Continue to follow with Dr. Oneida Alar. He has predominantly in indirect hyperbilirubinemia. I do not have obvious evidence of hemolysis given his blood counts. But we can consider evaluating for that if nothing else turns up.  Scheduled for a colonoscopy next week. His head MRI is also scheduled for next week. I will plan on seeing him back after his colonoscopy and imaging.  -[place] home health referral today as the patient is falling at home. He is unable to get out and is homebound. Is currently too difficult for his wife to continually bring him back and forth.  All questions were answered. The patient knows to call the clinic with any problems, questions or concerns. We can certainly see the patient much  sooner if necessary.   This document serves as a record of services personally performed by Ancil Linsey, MD. It was created on her behalf by Janace Hoard, a trained medical scribe. The creation of this record is based on the scribe's personal observations and the provider's statements to them. This document has been checked and approved by the attending provider.  I have reviewed the above documentation for accuracy and completeness, and I agree with the above.  This note was electronically signed.  Kelby Fam. Whitney Muse, MD

## 2015-09-28 NOTE — Patient Instructions (Signed)
[place] at [facility] Discharge Instructions  RECOMMENDATIONS MADE BY THE CONSULTANT AND ANY TEST RESULTS WILL BE SENT TO YOUR REFERRING PHYSICIAN.  Exam and discussion by Dr. Whitney Muse Will make referral to [place] to assist with mobility Call with any concerns or issues  Follow-up in 2 weeks.  Thank you for choosing [place] at [facility] to provide your oncology and hematology care.  To afford each patient quality time with our Aun[facility] Pellegrino, please arrive at least 15 minutes before your scheduled appointment time.    You need to re-schedule your appointment should you arrive 10 or more minutes late.  We strive to give you quality time with our providers, and arriving late affects you and other patients whose appointments are after yours.  Also, if you no show three or more times for appointments you may be dismissed from the clinic at the providers discretion.     Again, thank you for choosing [facility].  Our hope is that these requests will decrease the amount of time that you wait before being seen by our physicians.       _____________________________________________________________  Should you have questions after your visit to [facility], please contact our office at [phone] between the hours of 8:30 a.m. and 4:30 p.m.  Voicemails left after 4:30 p.m. will not be returned until the following business day.  For prescription refill requests, have your pharmacy contact our office.

## 2015-09-29 ENCOUNTER — Encounter (HOSPITAL_COMMUNITY): Payer: Self-pay

## 2015-10-02 ENCOUNTER — Ambulatory Visit (HOSPITAL_COMMUNITY)
Admission: RE | Admit: 2015-10-02 | Discharge: 2015-10-02 | Disposition: A | Discharge status: Home Health | Payer: Medicare Other | Source: Ambulatory Visit | Attending: Gastroenterology | Admitting: Gastroenterology

## 2015-10-02 ENCOUNTER — Encounter (HOSPITAL_COMMUNITY): Payer: Self-pay | Admitting: *Deleted

## 2015-10-02 ENCOUNTER — Ambulatory Visit: Admit: 2015-10-02 | Payer: Medicare Other | Admitting: Gastroenterology

## 2015-10-02 ENCOUNTER — Other Ambulatory Visit: Payer: Self-pay

## 2015-10-02 ENCOUNTER — Encounter (HOSPITAL_COMMUNITY)
Admission: RE | Disposition: A | Discharge status: Home Health | Payer: Self-pay | Source: Ambulatory Visit | Attending: Gastroenterology

## 2015-10-02 ENCOUNTER — Other Ambulatory Visit (HOSPITAL_COMMUNITY): Payer: Medicare Other

## 2015-10-02 DIAGNOSIS — C189 Malignant neoplasm of colon, unspecified: Secondary | ICD-10-CM | POA: Diagnosis not present

## 2015-10-02 DIAGNOSIS — R194 Change in bowel habit: Secondary | ICD-10-CM | POA: Insufficient documentation

## 2015-10-02 DIAGNOSIS — Z800 Family history of malignant neoplasm of digestive organs: Secondary | ICD-10-CM | POA: Insufficient documentation

## 2015-10-02 DIAGNOSIS — Z79899 Other long term (current) drug therapy: Secondary | ICD-10-CM | POA: Diagnosis not present

## 2015-10-02 DIAGNOSIS — I10 Essential (primary) hypertension: Secondary | ICD-10-CM | POA: Insufficient documentation

## 2015-10-02 DIAGNOSIS — R55 Syncope and collapse: Secondary | ICD-10-CM | POA: Insufficient documentation

## 2015-10-02 DIAGNOSIS — Z6839 Body mass index (BMI) 39.0-39.9, adult: Secondary | ICD-10-CM | POA: Insufficient documentation

## 2015-10-02 DIAGNOSIS — K219 Gastro-esophageal reflux disease without esophagitis: Secondary | ICD-10-CM | POA: Insufficient documentation

## 2015-10-02 DIAGNOSIS — R42 Dizziness and giddiness: Secondary | ICD-10-CM | POA: Insufficient documentation

## 2015-10-02 DIAGNOSIS — C439 Malignant melanoma of skin, unspecified: Secondary | ICD-10-CM | POA: Insufficient documentation

## 2015-10-02 DIAGNOSIS — Z803 Family history of malignant neoplasm of breast: Secondary | ICD-10-CM | POA: Diagnosis not present

## 2015-10-02 DIAGNOSIS — Z9221 Personal history of antineoplastic chemotherapy: Secondary | ICD-10-CM | POA: Insufficient documentation

## 2015-10-02 DIAGNOSIS — Z9049 Acquired absence of other specified parts of digestive tract: Secondary | ICD-10-CM

## 2015-10-02 DIAGNOSIS — Z85858 Personal history of malignant neoplasm of other endocrine glands: Secondary | ICD-10-CM

## 2015-10-02 DIAGNOSIS — I4891 Unspecified atrial fibrillation: Secondary | ICD-10-CM | POA: Insufficient documentation

## 2015-10-02 DIAGNOSIS — G4730 Sleep apnea, unspecified: Secondary | ICD-10-CM | POA: Diagnosis not present

## 2015-10-02 HISTORY — PX: COLONOSCOPY: SHX5424

## 2015-10-02 SURGERY — COLONOSCOPY
Anesthesia: Moderate Sedation

## 2015-10-02 SURGERY — SIGMOIDOSCOPY, FLEXIBLE
Anesthesia: Moderate Sedation

## 2015-10-02 MED ORDER — STERILE WATER FOR IRRIGATION IR SOLN
Status: DC | PRN
  Administered 2015-10-02: 13:00:00

## 2015-10-02 MED ORDER — MIDAZOLAM HCL 5 MG/5ML IJ SOLN
INTRAMUSCULAR | Status: DC | PRN
  Administered 2015-10-02 (×2): 2 mg via INTRAVENOUS

## 2015-10-02 MED ORDER — MEPERIDINE HCL 100 MG/ML IJ SOLN
INTRAMUSCULAR | Status: AC
  Filled 2015-10-02: qty 2

## 2015-10-02 MED ORDER — MIDAZOLAM HCL 5 MG/5ML IJ SOLN
INTRAMUSCULAR | Status: AC
  Filled 2015-10-02: qty 10

## 2015-10-02 MED ORDER — SODIUM CHLORIDE 0.9 % IV SOLN
INTRAVENOUS | Status: DC
  Administered 2015-10-02: 12:00:00 via INTRAVENOUS

## 2015-10-02 MED ORDER — MEPERIDINE HCL 100 MG/ML IJ SOLN
INTRAMUSCULAR | Status: DC | PRN
  Administered 2015-10-02 (×2): 25 mg via INTRAVENOUS

## 2015-10-02 NOTE — H&P (View-Only) (Signed)
Subjective:    [name], male    DOB: 07-Sep-1946, 69 y.o.   MRN: [id number] [name], MD  HPI TROUBLE HAVING A BM AND ELEVATED LIVER ENZYMES. LAST SEEN MAR 2016 AND HAD INCOMPLETE TCS DUE TO OBSTRUCTING DESCENDING COLON MASS RESULTING IN LEFT COLECTOMY. S/P CHEMO W/O XRT. LAST DOSE 2 WEEKS AGO.  BMs: WAS ONCE A DAY ABUT NOT JUST A LITTLE. HAVING TO STRAIN. HAD HEMORRHOIDS ON LAST TCS. NOW USING CPAP NOW. RECENTLY DIAGNOSED WITH MELANOMA AND NON-MELANOMA. LESIONS REMOVED ON BACK AND LEFT FACE. HIS CRACK IS SORE. BURNING AFTER BM. SOILING: EVERY DAY(NEW) NO RECTAL PRESSUREOR ITCHING.  PT DENIES FEVER, CHILLS, nausea, vomiting, melena, diarrhea, CHEST PAIN, SHORTNESS OF BREATH,  abdominal pain, problems swallowing, problems with sedation, OR heartburn or indigestion.    Past Medical History  Diagnosis Date  . Varicose veins   . New onset atrial fibrillation 01/02/2015  . Morbid obesity 01/02/2015  . Hypertension   . Dysrhythmia     Afib- on set 12/2014  . Sleep apnea     been tested but has not received the CPAP yet  . GERD (gastroesophageal reflux disease)   . History of gout   . Cancer     colon  . Family history of colon cancer   . Family history of breast cancer in mother    Past Surgical History  Procedure Laterality Date  . Vein ligation and stripping      left leg  . Colonoscopy N/A 03/28/2015    SLF: 1. Abdominal pain, diarrhea, rectal bleeding due to obstructing colon mass  . Esophagogastroduodenoscopy N/A 03/28/2015    SLF: 1. dysphagia 2. Mild non-erosive gastritis.   . Esophageal dilation N/A 03/28/2015    Procedure: ESOPHAGEAL DILATION;  Surgeon: [name], MD;  Location: [facility];  Service: Endoscopy;  Laterality: N/A;  . Partial colectomy N/A 04/03/2015    Procedure: PARTIAL COLECTOMY;  Surgeon: [name], MD;  Location: [facility];  Service: General;  Laterality: N/A;  . Portacath placement N/A 05/10/2015    Procedure: INSERTION  PORT-A-CATH;  Surgeon: [name], MD;  Location: [facility];  Service: General;  Laterality: N/A;  left subclavian  . Skin cancer destruction Left 08/16/15    left side of nose and left back    No Known Allergies  Current Outpatient Prescriptions  Medication Sig Dispense Refill  . apixaban (ELIQUIS) 5 MG TABS tablet Take 5 mg by mouth daily.    .      . ciprofloxacin (CILOXAN) 0.3 % ophthalmic solution Administer 1 drop, every 2 hours, while awake, for 2 days. Then 1 drop, every 4 hours, while awake, for the next 5 days.    .      . escitalopram (LEXAPRO) 20 MG tablet Take 1 tablet (20 mg total) by mouth daily.    .      .      . LEUCOVORIN CALCIUM IV Inject into the vein every 14 (fourteen) days. To be started in near future STOPPED YESTERDAY   . lidocaine-prilocaine (EMLA) cream Apply a quarter size amount to port site 1 hour prior to chemo. Do not rub in. Cover with plastic wrap. FOR HIS PORT   . lisinopril (PRINIVIL,ZESTRIL) 10 MG tablet Take 1 tablet (10 mg total) by mouth daily.    [name] omeprazole (PRILOSEC) 20 MG capsule Take 1 capsule (20 mg total) by mouth daily.    . ondansetron (ZOFRAN) 8 MG  tablet Take 1 tablet (8 mg total) by mouth every 8 (eight) hours as needed.    . OXALIPLATIN IV Inject into the vein every 14 (fourteen) days. To start in near future NONE FOR 2 WEEKS   .      .      .          Family History  Problem Relation Age of Onset  . Breast cancer Mother     dx under 35  . Colon cancer Mother     dx in her mid 26s  . Heart disease Father   . Hyperlipidemia Father   . Liver cancer Father     heavy ETOH user when young  . Cancer Sister     slow "blood" cancer  . Diabetes Sister   . Diabetes Brother   . Cancer Maternal Aunt     2-3 maternal aunts with Cancer NOS  . Lung cancer Paternal Uncle     three uncles with lung cancer - all smokers  . Cancer Paternal Grandmother     possible bone cancer   Social History   Social History  . Marital Status:  Married    Spouse Name: [name]  . Number of Children: 2  . Years of Education: N/A   Occupational History  . Not on file.   Social History Main Topics  . Smoking status: Never Smoker   . Smokeless tobacco: Never Used  . Alcohol Use: No  . Drug Use: No  . Sexual Activity: No   Other Topics Concern  . Not on file   Social History Narrative   Review of Systems PER HPI OTHERWISE ALL SYSTEMS ARE NEGATIVE.    Objective:   Physical Exam  Constitutional: He is oriented to person, place, and time. He appears well-developed and well-nourished. No distress.  HENT:  Head: Normocephalic and atraumatic.  Mouth/Throat: Oropharynx is clear and moist. No oropharyngeal exudate.  Eyes: Pupils are equal, round, and reactive to light. No scleral icterus.  Neck: Normal range of motion. Neck supple.  Cardiovascular: Normal rate, regular rhythm and normal heart sounds.   Pulmonary/Chest: Effort normal and breath sounds normal. No respiratory distress.  Abdominal: Soft. Bowel sounds are normal. He exhibits no distension. There is no tenderness.  EXAM LIMITED-PT IN [facility]   Musculoskeletal: He exhibits edema (2-3+ BIL LEs).  Lymphadenopathy:    He has no cervical adenopathy.  Neurological: He is alert and oriented to person, place, and time.  NO  NEW FOCAL DEFICITS   Psychiatric: He has a normal mood and affect.  Vitals reviewed.         Assessment & Plan:

## 2015-10-02 NOTE — Telephone Encounter (Signed)
POOR PREP ON TCS TODAY.   GO TO OFFICE TO PICK UP INSTRUCTIONS ON CONTINUING YOUR BOWEL PREP. TAKE SUPREP TODAY.  FOLLOW A CLEAR LIQUID DIET.   Do not take the Metamucil today or tomorrow.  Next colonoscopy TOMORROW.

## 2015-10-02 NOTE — Op Note (Signed)
[facility] [address] [place], [address]   COLONOSCOPY PROCEDURE REPORT  PATIENT: [name], [name]  MR#: [id number] BIRTHDATE: [birth year] , 68  yrs. old GENDER: male ENDOSCOPIST: [name], MD REFERRED DJ:MEQAST Hawkins, M.D.  Ancil Linsey, MD PROCEDURE DATE:  29-Oct-2015 PROCEDURE:   Colonoscopy, diagnostic INDICATIONS:change in bowel habits and COLON CANCER RESULTING IN L HEMICOLECTOMY IN APR 2016. MEDICATIONS: Demerol 50 mg IV and Versed 4 mg IV  DESCRIPTION OF PROCEDURE:    Physical exam was performed.  Informed consent was obtained from the patient after explaining the benefits, risks, and alternatives to procedure.  The patient was connected to monitor and placed in left lateral position. Continuous oxygen was provided by nasal cannula and IV medicine administered through an indwelling cannula.  After administration of sedation and rectal exam, the patients rectum was intubated and the EC-[id number] ([id number])  colonoscope was advanced under direct visualization to the cecum.  The scope was removed slowly by carefully examining the color, texture, anatomy, and integrity mucosa on the way out.  The patient was recovered in endoscopy and discharged home in satisfactory condition. Estimated blood loss is zero unless otherwise noted in this procedure report.    COLON FINDINGS: NORMAL ANASTOMOSIS.  3 ANAL FISSURES and LIMITED VIEW OF COLONIC MUCOSA DUE TO POOR BOWEL PREP.  PREP QUALITY: poor.  CECAL W/D TIME:     minutes   COMPLICATIONS: None  ENDOSCOPIC IMPRESSION: 1.   NORMAL ANASTOMOSIS. 2.   LIMITED VIEW OF COLONIC MUCOSA DUE TO POOR BOWEL PREP 3.   THREE ANAL FISSURES  RECOMMENDATIONS: GO TO OFFICE TO PICK UP SUPREP INSTRUCTIONS ON CONTINUING BOWEL PREP. FOLLOW A CLEAR LIQUID DIET. Next colonoscopy TOMORROW.     _______________________________ eSignedDanie Binder, MD 29-Oct-2015 1:36 PM    CPT CODES: ICD CODES:  The ICD  and CPT codes recommended by this software are interpretations from the data that the clinical staff has captured with the software.  The verification of the translation of this report to the ICD and CPT codes and modifiers is the sole responsibility of the health care institution and practicing physician where this report was generated.  [employer]. will not be held responsible for the validity of the ICD and CPT codes included on this report.  AMA assumes no liability for data contained or not contained herein. CPT is a Designer, television/film set of the [employer].

## 2015-10-02 NOTE — Interval H&P Note (Signed)
History and Physical Interval Note:  10/02/2015 12:48 PM  [name]  has presented today for surgery, with the diagnosis of change in bowel habits, colon cancer; s/p left hemicolectomy  The various methods of treatment have been discussed with the patient and family. After consideration of risks, benefits and other options for treatment, the patient has consented to  Procedure(s) with comments: COLONOSCOPY (N/A) - 1315 as a surgical intervention .  The patient's history has been reviewed, patient examined, no change in status, stable for surgery.  I have reviewed the patient's chart and labs.  Questions were answered to the patient's satisfaction.     [employer]

## 2015-10-02 NOTE — Telephone Encounter (Signed)
Family came by to pick up prep and instructions

## 2015-10-02 NOTE — Telephone Encounter (Signed)
Noted and prep and instructions are ready for pick up

## 2015-10-02 NOTE — Progress Notes (Signed)
Discussed with patient's family member,Tina,  the possibility of small amount of bleeding due to fissures and hemorrhoids when taking the prep per Dr. Oneida Alar. Instructed patient's family to seek Emergency care for large amount of bleeding that is not resolved, per discharge instructions. Patient's wife verbalized understanding.

## 2015-10-02 NOTE — Discharge Instructions (Signed)
You have internal hemorrhoids. YOU DID NOT HAVE ANY POLYPS. I WAS UNABLE TO COMPLETE YOUR COLONOSCOPY DUE TO INCOMPLETE BOWEL PREP. I SAW 80% OF YOUR COLON.   GO TO OFFICE TO PICK UP INSTRUCTIONS ON CONTINUING YOUR BOWEL PREP.  FOLLOW A CLEAR LIQUID DIET.   TAKE SUPREP TODAY.  Do not take the Metamucil today or tomorrow.  Next colonoscopy TOMORROW. Be a short stay at 1:15PM tomorrow, Tuesday, October 4.   Colonoscopy Care After Read the instructions outlined below and refer to this sheet in the next week. These discharge instructions provide you with general information on caring for yourself after you leave the hospital. While your treatment has been planned according to the most current medical practices available, unavoidable complications occasionally occur. If you have any problems or questions after discharge, call DR. FIELDS, [phone].  ACTIVITY  You may resume your regular activity, but move at a slower pace for the next 24 hours.   Take frequent rest periods for the next 24 hours.   Walking will help get rid of the air and reduce the bloated feeling in your belly (abdomen).   No driving for 24 hours (because of the medicine (anesthesia) used during the test).   You may shower.   Do not sign any important legal documents or operate any machinery for 24 hours (because of the anesthesia used during the test).    NUTRITION  Drink plenty of fluids.   You may resume your normal diet as instructed by your doctor.   Begin with a light meal and progress to your normal diet. Heavy or fried foods are harder to digest and may make you feel sick to your stomach (nauseated).   Avoid alcoholic beverages for 24 hours or as instructed.    MEDICATIONS  You may resume your normal medications.   WHAT YOU CAN EXPECT TODAY  Some feelings of bloating in the abdomen.   Passage of more gas than usual.   Spotting of blood in your stool or on the toilet paper  .  IF YOU  HAD POLYPS REMOVED DURING THE COLONOSCOPY:  Eat a soft diet IF YOU HAVE NAUSEA, BLOATING, ABDOMINAL PAIN, OR VOMITING.    FINDING OUT THE RESULTS OF YOUR TEST Not all test results are available during your visit. DR. Oneida Alar WILL CALL YOU WITHIN 7 DAYS OF YOUR PROCEDUE WITH YOUR RESULTS. Do not assume everything is normal if you have not heard from DR. FIELDS IN ONE WEEK, CALL HER OFFICE AT [phone].  SEEK IMMEDIATE MEDICAL ATTENTION AND CALL THE OFFICE: [phone] IF:  You have more than a spotting of blood in your stool.   Your belly is swollen (abdominal distention).   You are nauseated or vomiting.   You have a temperature over 101F.   You have abdominal pain or discomfort that is severe or gets worse throughout the day.  Hemorrhoids Hemorrhoids are dilated (enlarged) veins around the rectum. Sometimes clots will form in the veins. This makes them swollen and painful. These are called thrombosed hemorrhoids. Causes of hemorrhoids include:  Constipation.   Straining to have a bowel movement.   HEAVY LIFTING HOME CARE INSTRUCTIONS  Eat a well balanced diet and drink 6 to 8 glasses of water every day to avoid constipation. You may also use a bulk laxative.   Avoid straining to have bowel movements.   Keep anal area dry and clean.   Do not use a donut shaped pillow or sit on the toilet for long  periods. This increases blood pooling and pain.   Move your bowels when your body has the urge; this will require less straining and will decrease pain and pressure.

## 2015-10-03 ENCOUNTER — Ambulatory Visit (HOSPITAL_COMMUNITY)
Admission: RE | Admit: 2015-10-03 | Discharge: 2015-10-03 | Disposition: A | Discharge status: Home / Self Care | Payer: Medicare Other | Source: Ambulatory Visit | Attending: Gastroenterology | Admitting: Gastroenterology

## 2015-10-03 ENCOUNTER — Encounter (HOSPITAL_COMMUNITY): Payer: Self-pay | Admitting: *Deleted

## 2015-10-03 ENCOUNTER — Inpatient Hospital Stay (HOSPITAL_COMMUNITY): Payer: Medicare Other

## 2015-10-03 ENCOUNTER — Encounter (HOSPITAL_COMMUNITY)
Admission: RE | Disposition: A | Discharge status: Home / Self Care | Payer: Self-pay | Source: Ambulatory Visit | Attending: Gastroenterology

## 2015-10-03 DIAGNOSIS — G4730 Sleep apnea, unspecified: Secondary | ICD-10-CM | POA: Insufficient documentation

## 2015-10-03 DIAGNOSIS — Z9221 Personal history of antineoplastic chemotherapy: Secondary | ICD-10-CM | POA: Diagnosis not present

## 2015-10-03 DIAGNOSIS — K620 Anal polyp: Secondary | ICD-10-CM | POA: Insufficient documentation

## 2015-10-03 DIAGNOSIS — Q438 Other specified congenital malformations of intestine: Secondary | ICD-10-CM | POA: Diagnosis not present

## 2015-10-03 DIAGNOSIS — I4891 Unspecified atrial fibrillation: Secondary | ICD-10-CM | POA: Insufficient documentation

## 2015-10-03 DIAGNOSIS — K602 Anal fissure, unspecified: Secondary | ICD-10-CM | POA: Insufficient documentation

## 2015-10-03 DIAGNOSIS — C189 Malignant neoplasm of colon, unspecified: Secondary | ICD-10-CM | POA: Diagnosis not present

## 2015-10-03 DIAGNOSIS — K219 Gastro-esophageal reflux disease without esophagitis: Secondary | ICD-10-CM | POA: Diagnosis not present

## 2015-10-03 DIAGNOSIS — Z6839 Body mass index (BMI) 39.0-39.9, adult: Secondary | ICD-10-CM | POA: Insufficient documentation

## 2015-10-03 DIAGNOSIS — Z79899 Other long term (current) drug therapy: Secondary | ICD-10-CM | POA: Diagnosis not present

## 2015-10-03 DIAGNOSIS — I10 Essential (primary) hypertension: Secondary | ICD-10-CM | POA: Diagnosis not present

## 2015-10-03 DIAGNOSIS — Z9049 Acquired absence of other specified parts of digestive tract: Secondary | ICD-10-CM | POA: Diagnosis not present

## 2015-10-03 DIAGNOSIS — C439 Malignant melanoma of skin, unspecified: Secondary | ICD-10-CM | POA: Insufficient documentation

## 2015-10-03 DIAGNOSIS — R194 Change in bowel habit: Secondary | ICD-10-CM | POA: Diagnosis not present

## 2015-10-03 DIAGNOSIS — K921 Melena: Secondary | ICD-10-CM | POA: Insufficient documentation

## 2015-10-03 HISTORY — PX: COLONOSCOPY: SHX5424

## 2015-10-03 SURGERY — COLONOSCOPY
Anesthesia: Moderate Sedation

## 2015-10-03 MED ORDER — MEPERIDINE HCL 100 MG/ML IJ SOLN
INTRAMUSCULAR | Status: DC | PRN
  Administered 2015-10-03 (×2): 25 mg via INTRAVENOUS

## 2015-10-03 MED ORDER — SODIUM CHLORIDE 0.9 % IV SOLN
INTRAVENOUS | Status: DC
  Administered 2015-10-03: 13:00:00 via INTRAVENOUS

## 2015-10-03 MED ORDER — MEPERIDINE HCL 100 MG/ML IJ SOLN
INTRAMUSCULAR | Status: AC
  Filled 2015-10-03: qty 2

## 2015-10-03 MED ORDER — STERILE WATER FOR IRRIGATION IR SOLN
Status: DC | PRN
  Administered 2015-10-03: 14:00:00

## 2015-10-03 MED ORDER — LINACLOTIDE 145 MCG PO CAPS: ORAL_CAPSULE | ORAL | Status: DC

## 2015-10-03 MED ORDER — MIDAZOLAM HCL 5 MG/5ML IJ SOLN
INTRAMUSCULAR | Status: DC | PRN
  Administered 2015-10-03: 1 mg via INTRAVENOUS
  Administered 2015-10-03 (×2): 2 mg via INTRAVENOUS

## 2015-10-03 MED ORDER — MIDAZOLAM HCL 5 MG/5ML IJ SOLN
INTRAMUSCULAR | Status: AC
  Filled 2015-10-03: qty 10

## 2015-10-03 NOTE — Discharge Instructions (Signed)
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 YOU DID NOT HAVE ANY POLYPS.I BIOPSIED ONE ANAL CANAL LESION. HIS ELEVATED LIVER ENZYMES ARE DUE TO FATTY LIVER DISEASE AND GILBERT'S SYNDROME.   DRINK WATER TO KEEP YOUR URINE LIGHT YELLOW.  FOLLOW A HIGH FIBER DIET. AVOID ITEMS THAT CAUSE BLOATING. SEE INFO BELOW.   TO TREAT CONSTIPATION, ADD LINZESS 30 MINS PRIOR TO BREAKFAST. IT MAY CAUSE EXPLOSIVE DIARRHEA. TRY IT FOR AT LEAST 3 DAYS. HOLD IT IF YOU HAVE WATERY DIARRHEA AND CALL THE OFFICE.   TO TREAT ANAL FISSURE, ADD Nitroglycerin ointment 0.125 %. USE a pea sized amount internally four times daily FOR ONE MONTH. IT MAY CAUSE HEADACHES OR DROP IN BLOOD PRESSURE. TAKE FIRST DOSE AND REMAIN SEATED OR LAYING DOWN FOR 10-15 MINS.   YOUR BIOPSY RESULTS WILL BE AVAILABLE IN MY CHART AFTER OCT 6 AND MY OFFICE WILL CONTACT YOU IN 10-14 DAYS WITH YOUR RESULTS.   FOLLOW UP IN 4 MOS.   Feb 7th at 11:00  Start Eliquis 5mg  daily on Friday 10/06/2015  Next colonoscopy in 1 year. YOUR SISTERS, BROTHERS, CHILDREN, AND PARENTS NEED TO HAVE A COLONOSCOPY STARTING AT THE AGE OF 40.      Colonoscopy Care After Read the instructions outlined below and refer to this sheet in the next week. These discharge instructions provide you with general information on caring for yourself after you leave the hospital. While your treatment has been planned according to the most current medical practices available, unavoidable complications occasionally occur. If you have any problems or questions after discharge, call DR. FIELDS, [phone].  ACTIVITY  You may resume your regular activity, but move at a slower pace for the next 24 hours.   Take frequent rest periods for the next 24 hours.   Walking will help get rid of the air and reduce the bloated feeling in your belly (abdomen).   No driving for 24 hours (because of the medicine (anesthesia) used during the test).   You may shower.   Do not sign any important legal documents  or operate any machinery for 24 hours (because of the anesthesia used during the test).    NUTRITION  Drink plenty of fluids.   You may resume your normal diet as instructed by your doctor.   Begin with a light meal and progress to your normal diet. Heavy or fried foods are harder to digest and may make you feel sick to your stomach (nauseated).   Avoid alcoholic beverages for 24 hours or as instructed.    MEDICATIONS  You may resume your normal medications.   WHAT YOU CAN EXPECT TODAY  Some feelings of bloating in the abdomen.   Passage of more gas than usual.   Spotting of blood in your stool or on the toilet paper  .  IF YOU HAD POLYPS REMOVED DURING THE COLONOSCOPY:  Eat a soft diet IF YOU HAVE NAUSEA, BLOATING, ABDOMINAL PAIN, OR VOMITING.    FINDING OUT THE RESULTS OF YOUR TEST Not all test results are available during your visit. DR. Oneida Alar WILL CALL YOU WITHIN 7 DAYS OF YOUR PROCEDUE WITH YOUR RESULTS. Do not assume everything is normal if you have not heard from DR. FIELDS IN ONE WEEK, CALL HER OFFICE AT [phone].  SEEK IMMEDIATE MEDICAL ATTENTION AND CALL THE OFFICE: [phone] IF:  You have more than a spotting of blood in your stool.   Your belly is swollen (abdominal distention).   You are nauseated or vomiting.   You have  a temperature over 101F.   You have abdominal pain or discomfort that is severe or gets worse throughout the day.  High-Fiber Diet A high-fiber diet changes your normal diet to include more whole grains, legumes, fruits, and vegetables. Changes in the diet involve replacing refined carbohydrates with unrefined foods. The calorie level of the diet is essentially unchanged. The Dietary Reference Intake (recommended amount) for adult males is 38 grams per day. For adult females, it is 25 grams per day. Pregnant and lactating women should consume 28 grams of fiber per day. Fiber is the intact part of a plant that is not broken down  during digestion. Functional fiber is fiber that has been isolated from the plant to provide a beneficial effect in the body. PURPOSE  Increase stool bulk.   Ease and regulate bowel movements.   Lower cholesterol.  INDICATIONS THAT YOU NEED MORE FIBER  Constipation and hemorrhoids.   Uncomplicated diverticulosis (intestine condition) and irritable bowel syndrome.   Weight management.   As a protective measure against hardening of the arteries (atherosclerosis), diabetes, and cancer.   GUIDELINES FOR INCREASING FIBER IN THE DIET  Start adding fiber to the diet slowly. A gradual increase of about 5 more grams (2 slices of whole-wheat bread, 2 servings of most fruits or vegetables, or 1 bowl of high-fiber cereal) per day is best. Too rapid an increase in fiber may result in constipation, flatulence, and bloating.   Drink enough water and fluids to keep your urine clear or pale yellow. Water, juice, or caffeine-free drinks are recommended. Not drinking enough fluid may cause constipation.   Eat a variety of high-fiber foods rather than one type of fiber.   Try to increase your intake of fiber through using high-fiber foods rather than fiber pills or supplements that contain small amounts of fiber.   The goal is to change the types of food eaten. Do not supplement your present diet with high-fiber foods, but replace foods in your present diet.  INCLUDE A VARIETY OF FIBER SOURCES  Replace refined and processed grains with whole grains, canned fruits with fresh fruits, and incorporate other fiber sources. White rice, white breads, and most bakery goods contain little or no fiber.   Brown whole-grain rice, buckwheat oats, and many fruits and vegetables are all good sources of fiber. These include: broccoli, Brussels sprouts, cabbage, cauliflower, beets, sweet potatoes, white potatoes (skin on), carrots, tomatoes, eggplant, squash, berries, fresh fruits, and dried fruits.   Cereals appear  to be the richest source of fiber. Cereal fiber is found in whole grains and bran. Bran is the fiber-rich outer coat of cereal grain, which is largely removed in refining. In whole-grain cereals, the bran remains. In breakfast cereals, the largest amount of fiber is found in those with "bran" in their names. The fiber content is sometimes indicated on the label.   You may need to include additional fruits and vegetables each day.   In baking, for 1 cup white flour, you may use the following substitutions:   1 cup whole-wheat flour minus 2 tablespoons.   1/2 cup white flour plus 1/2 cup whole-wheat flour.   Hemorrhoids Hemorrhoids are dilated (enlarged) veins around the rectum. Sometimes clots will form in the veins. This makes them swollen and painful. These are called thrombosed hemorrhoids. Causes of hemorrhoids include:  Constipation.   Straining to have a bowel movement.   HEAVY LIFTING HOME CARE INSTRUCTIONS  Eat a well balanced diet and drink 6 to 8  glasses of water every day to avoid constipation. You may also use a bulk laxative.   Avoid straining to have bowel movements.   Keep anal area dry and clean.   Do not use a donut shaped pillow or sit on the toilet for long periods. This increases blood pooling and pain.   Move your bowels when your body has the urge; this will require less straining and will decrease pain and pressure.

## 2015-10-03 NOTE — Interval H&P Note (Signed)
History and Physical Interval Note:  10/03/2015 1:35 PM  [name]  has presented today for surgery, with the diagnosis of history of colon cancer/ change in bowel habits, status post left hemicolectomy  The various methods of treatment have been discussed with the patient and family. After consideration of risks, benefits and other options for treatment, the patient has consented to  Procedure(s): COLONOSCOPY (N/A) as a surgical intervention .  The patient's history has been reviewed, patient examined, no change in status, stable for surgery.  I have reviewed the patient's chart and labs.  Questions were answered to the patient's satisfaction.     [employer]

## 2015-10-03 NOTE — H&P (View-Only) (Signed)
Subjective:    [name], male    DOB: [birth year], 69 y.o.   MRN: [id number] [name], MD  HPI TROUBLE HAVING A BM AND ELEVATED LIVER ENZYMES. LAST SEEN MAR 2016 AND HAD INCOMPLETE TCS DUE TO OBSTRUCTING DESCENDING COLON MASS RESULTING IN LEFT COLECTOMY. S/P CHEMO W/O XRT. LAST DOSE 2 WEEKS AGO.  BMs: WAS ONCE A DAY ABUT NOT JUST A LITTLE. HAVING TO STRAIN. HAD HEMORRHOIDS ON LAST TCS. NOW USING CPAP NOW. RECENTLY DIAGNOSED WITH MELANOMA AND NON-MELANOMA. LESIONS REMOVED ON BACK AND LEFT FACE. HIS CRACK IS SORE. BURNING AFTER BM. SOILING: EVERY DAY(NEW) NO RECTAL PRESSUREOR ITCHING.  PT DENIES FEVER, CHILLS, nausea, vomiting, melena, diarrhea, CHEST PAIN, SHORTNESS OF BREATH,  abdominal pain, problems swallowing, problems with sedation, OR heartburn or indigestion.    Past Medical History  Diagnosis Date  . Varicose veins   . New onset atrial fibrillation 01/02/2015  . Morbid obesity 01/02/2015  . Hypertension   . Dysrhythmia     Afib- on set 12/2014  . Sleep apnea     been tested but has not received the CPAP yet  . GERD (gastroesophageal reflux disease)   . History of gout   . Cancer     colon  . Family history of colon cancer   . Family history of breast cancer in mother    Past Surgical History  Procedure Laterality Date  . Vein ligation and stripping      left leg  . Colonoscopy N/A 03/28/2015    SLF: 1. Abdominal pain, diarrhea, rectal bleeding due to obstructing colon mass  . Esophagogastroduodenoscopy N/A 03/28/2015    SLF: 1. dysphagia 2. Mild non-erosive gastritis.   . Esophageal dilation N/A 03/28/2015    Procedure: ESOPHAGEAL DILATION;  Surgeon: [name], MD;  Location: [facility];  Service: Endoscopy;  Laterality: N/A;  . Partial colectomy N/A 04/03/2015    Procedure: PARTIAL COLECTOMY;  Surgeon: [name], MD;  Location: [facility];  Service: General;  Laterality: N/A;  . Portacath placement N/A 05/10/2015    Procedure: INSERTION  PORT-A-CATH;  Surgeon: [name], MD;  Location: [facility];  Service: General;  Laterality: N/A;  left subclavian  . Skin cancer destruction Left 08/16/15    left side of nose and left back    No Known Allergies  Current Outpatient Prescriptions  Medication Sig Dispense Refill  . apixaban (ELIQUIS) 5 MG TABS tablet Take 5 mg by mouth daily.    .      . ciprofloxacin (CILOXAN) 0.3 % ophthalmic solution Administer 1 drop, every 2 hours, while awake, for 2 days. Then 1 drop, every 4 hours, while awake, for the next 5 days.    .      . escitalopram (LEXAPRO) 20 MG tablet Take 1 tablet (20 mg total) by mouth daily.    .      .      . LEUCOVORIN CALCIUM IV Inject into the vein every 14 (fourteen) days. To be started in near future STOPPED YESTERDAY   . lidocaine-prilocaine (EMLA) cream Apply a quarter size amount to port site 1 hour prior to chemo. Do not rub in. Cover with plastic wrap. FOR HIS PORT   . lisinopril (PRINIVIL,ZESTRIL) 10 MG tablet Take 1 tablet (10 mg total) by mouth daily.    [name] omeprazole (PRILOSEC) 20 MG capsule Take 1 capsule (20 mg total) by mouth daily.    . ondansetron (ZOFRAN) 8 MG  tablet Take 1 tablet (8 mg total) by mouth every 8 (eight) hours as needed.    . OXALIPLATIN IV Inject into the vein every 14 (fourteen) days. To start in near future NONE FOR 2 WEEKS   .      .      .          Family History  Problem Relation Age of Onset  . Breast cancer Mother     dx under 68  . Colon cancer Mother     dx in her mid 64s  . Heart disease Father   . Hyperlipidemia Father   . Liver cancer Father     heavy ETOH user when young  . Cancer Sister     slow "blood" cancer  . Diabetes Sister   . Diabetes Brother   . Cancer Maternal Aunt     2-3 maternal aunts with Cancer NOS  . Lung cancer Paternal Uncle     three uncles with lung cancer - all smokers  . Cancer Paternal Grandmother     possible bone cancer   Social History   Social History  . Marital Status:  Married    Spouse Name: [name]  . Number of Children: 2  . Years of Education: N/A   Occupational History  . Not on file.   Social History Main Topics  . Smoking status: Never Smoker   . Smokeless tobacco: Never Used  . Alcohol Use: No  . Drug Use: No  . Sexual Activity: No   Other Topics Concern  . Not on file   Social History Narrative   Review of Systems PER HPI OTHERWISE ALL SYSTEMS ARE NEGATIVE.    Objective:   Physical Exam  Constitutional: He is oriented to person, place, and time. He appears well-developed and well-nourished. No distress.  HENT:  Head: Normocephalic and atraumatic.  Mouth/Throat: Oropharynx is clear and moist. No oropharyngeal exudate.  Eyes: Pupils are equal, round, and reactive to light. No scleral icterus.  Neck: Normal range of motion. Neck supple.  Cardiovascular: Normal rate, regular rhythm and normal heart sounds.   Pulmonary/Chest: Effort normal and breath sounds normal. No respiratory distress.  Abdominal: Soft. Bowel sounds are normal. He exhibits no distension. There is no tenderness.  EXAM LIMITED-PT IN [facility]   Musculoskeletal: He exhibits edema (2-3+ BIL LEs).  Lymphadenopathy:    He has no cervical adenopathy.  Neurological: He is alert and oriented to person, place, and time.  NO  NEW FOCAL DEFICITS   Psychiatric: He has a normal mood and affect.  Vitals reviewed.         Assessment & Plan:

## 2015-10-03 NOTE — Op Note (Signed)
[facility] [address] [place], [address]   COLONOSCOPY PROCEDURE REPORT  PATIENT: [name], [name]  MR#: [id number] BIRTHDATE: [birth year] , 68  yrs. old GENDER: male ENDOSCOPIST: [name], MD REFERRED HU:[id number] Hawkins, M.D.  Ancil Linsey, MD PROCEDURE DATE:  October 14, 2015 PROCEDURE:   Colonoscopy with biopsy INDICATIONS:change in bowel habits, hematochezia, and high risk patient with personal history of colon cancer. MEDICATIONS: Demerol 50 mg IV and Versed 5 mg IV  DESCRIPTION OF PROCEDURE:    Physical exam was performed.  Informed consent was obtained from the patient after explaining the benefits, risks, and alternatives to procedure.  The patient was connected to monitor and placed in left lateral position. Continuous oxygen was provided by nasal cannula and IV medicine administered through an indwelling cannula.  After administration of sedation and rectal exam, the patients rectum was intubated and the EC-[id number] ([id number])  colonoscope was advanced under direct visualization to the cecum.  The scope was removed slowly by carefully examining the color, texture, anatomy, and integrity mucosa on the way out.  The patient was recovered in endoscopy and discharged home in satisfactory condition. Estimated blood loss is zero unless otherwise noted in this procedure report.    COLON FINDINGS: The colon was redundant.  Manual abdominal counter-pressure was used to reach the cecum, ANAL FISSURES(3), VERRUCOUS ANAL CANAL LESION BIOPSIED VIA COLD FORCEPS, NORMAL ANASTOMOSIS, and POLYPS LESS THAN 1 CM WOULD HAVE BEEN MISSED.  NO MASSES IR LARGE POLYPS SEEN.  PREP QUALITY: fair.CECAL W/D TIME: 31       minutes   COMPLICATIONS: None  ENDOSCOPIC IMPRESSION: 1.   The colon is redundant 2.   ANAL FISSURES & VERRUCOUS ANAL CANAL LESION 3.   NORMAL ANASTOMOSIS  RECOMMENDATIONS: AWAIT BIOPSY RESTART ELIQUIS OCT 7. DRINK WATER HIGH FIBER DIET ADD NTG  0.125% OINTMENT QID FOR ONE MO OPV IN FOUR MOS NEXT TCS IN ONE YEAR WITH A FULL LIQUID DIET FOR 2 DAYS PRIOR AND CLEAR LIQUID ONE DAY PRIOR.  eSigned:  [name], MD 14-Oct-2015 8:36 PM   CPT CODES: ICD CODES:  The ICD and CPT codes recommended by this software are interpretations from the data that the clinical staff has captured with the software.  The verification of the translation of this report to the ICD and CPT codes and modifiers is the sole responsibility of the health care institution and practicing physician where this report was generated.  [employer] will not be held responsible for the validity of the ICD and CPT codes included on this report.  AMA assumes no liability for data contained or not contained herein. CPT is a Designer, television/film set of the [employer].

## 2015-10-05 ENCOUNTER — Ambulatory Visit (HOSPITAL_COMMUNITY)
Admission: RE | Admit: 2015-10-05 | Discharge: 2015-10-05 | Disposition: A | Discharge status: Home / Self Care | Payer: Medicare Other | Source: Ambulatory Visit | Attending: Hematology & Oncology | Admitting: Hematology & Oncology

## 2015-10-05 ENCOUNTER — Encounter (HOSPITAL_COMMUNITY): Payer: Medicare Other

## 2015-10-05 ENCOUNTER — Encounter (HOSPITAL_COMMUNITY): Payer: Self-pay | Admitting: Lab

## 2015-10-05 DIAGNOSIS — R194 Change in bowel habit: Secondary | ICD-10-CM | POA: Diagnosis not present

## 2015-10-05 DIAGNOSIS — R42 Dizziness and giddiness: Secondary | ICD-10-CM

## 2015-10-05 MED ORDER — GADOBENATE DIMEGLUMINE 529 MG/ML IV SOLN
20.0000 mL | Freq: Once | INTRAVENOUS | Status: AC | PRN
  Administered 2015-10-05: 20 mL via INTRAVENOUS

## 2015-10-05 NOTE — Progress Notes (Signed)
Referral sent to Dr [name].  Records faxed on 10/6.  They will call patient.

## 2015-10-06 ENCOUNTER — Telehealth: Payer: Self-pay | Admitting: Gastroenterology

## 2015-10-06 ENCOUNTER — Telehealth (HOSPITAL_COMMUNITY): Payer: Self-pay

## 2015-10-06 NOTE — Telephone Encounter (Signed)
Pts wife is aware of results.

## 2015-10-06 NOTE — Telephone Encounter (Signed)
PLEASE CALL PT. HE HAD A BENIGN POLYP REMOVED FROM HIS ANAL CANAL.   Start Eliquis 5mg  daily on Friday 10/06/2015  DRINK WATER TO KEEP YOUR URINE LIGHT YELLOW.  FOLLOW A HIGH FIBER DIET. AVOID ITEMS THAT CAUSE BLOATING.   TO TREAT CONSTIPATION, ADD LINZESS 30 MINS PRIOR TO BREAKFAST. IT MAY CAUSE EXPLOSIVE DIARRHEA. TRY IT FOR AT LEAST 3 DAYS. HOLD IT IF YOU HAVE WATERY DIARRHEA AND CALL THE OFFICE.  TO TREAT ANAL FISSURE, ADD Nitroglycerin ointment 0.125 %. USE a pea sized amount internally four times daily FOR ONE MONTH. IT MAY CAUSE HEADACHES OR DROP IN BLOOD PRESSURE. TAKE FIRST DOSE AND REMAIN SEATED OR LAYING DOWN FOR 10-15 MINS.  FOLLOW UP IN Feb 7th at 11:00   Next colonoscopy in 1 year. YOUR SISTERS, BROTHERS, CHILDREN, AND PARENTS NEED TO HAVE A COLONOSCOPY STARTING AT THE AGE OF 40.

## 2015-10-06 NOTE — Telephone Encounter (Signed)
REMINDER IN EPIC °

## 2015-10-06 NOTE — Telephone Encounter (Signed)
Returned call to Mrs. Weil.  Stated that when Mr. Gorka went to the BR early this am around 3 am, urine was dark and looked like it had blood in it.  Since then has not seen any more blood.  Wants to know what to do.  Denies any pain, burning with urination, no fevers or other concerns that he was doing ok at the present time.  Encouraged to discuss with PCP or if symptoms recur and has pain, fevers or discomfort to go to ED.

## 2015-10-10 ENCOUNTER — Encounter (HOSPITAL_COMMUNITY): Payer: Self-pay | Admitting: Gastroenterology

## 2015-10-12 ENCOUNTER — Encounter (HOSPITAL_BASED_OUTPATIENT_CLINIC_OR_DEPARTMENT_OTHER): Payer: Medicare Other | Admitting: Hematology & Oncology

## 2015-10-12 ENCOUNTER — Encounter (HOSPITAL_COMMUNITY): Payer: Self-pay | Admitting: Hematology & Oncology

## 2015-10-12 VITALS — BP 118/57 | HR 75 | Temp 97.8°F | Resp 18 | Wt 272.9 lb

## 2015-10-12 DIAGNOSIS — R530 Neoplastic (malignant) related fatigue: Secondary | ICD-10-CM | POA: Diagnosis not present

## 2015-10-12 DIAGNOSIS — R748 Abnormal levels of other serum enzymes: Secondary | ICD-10-CM

## 2015-10-12 DIAGNOSIS — I110 Hypertensive heart disease with heart failure: Secondary | ICD-10-CM | POA: Diagnosis not present

## 2015-10-12 DIAGNOSIS — Z23 Encounter for immunization: Secondary | ICD-10-CM

## 2015-10-12 DIAGNOSIS — R609 Edema, unspecified: Secondary | ICD-10-CM

## 2015-10-12 DIAGNOSIS — C186 Malignant neoplasm of descending colon: Secondary | ICD-10-CM

## 2015-10-12 DIAGNOSIS — F418 Other specified anxiety disorders: Secondary | ICD-10-CM | POA: Diagnosis not present

## 2015-10-12 DIAGNOSIS — R319 Hematuria, unspecified: Secondary | ICD-10-CM

## 2015-10-12 DIAGNOSIS — R42 Dizziness and giddiness: Secondary | ICD-10-CM

## 2015-10-12 LAB — URINALYSIS, ROUTINE W REFLEX MICROSCOPIC
Glucose, UA: NEGATIVE mg/dL
HGB URINE DIPSTICK: NEGATIVE
Ketones, ur: NEGATIVE mg/dL
Leukocytes, UA: NEGATIVE
NITRITE: NEGATIVE
PROTEIN: NEGATIVE mg/dL
Specific Gravity, Urine: 1.02 (ref 1.005–1.030)
UROBILINOGEN UA: 2 mg/dL — AB (ref 0.0–1.0)
pH: 6 (ref 5.0–8.0)

## 2015-10-12 MED ORDER — HEPARIN SOD (PORK) LOCK FLUSH 100 UNIT/ML IV SOLN
500.0000 [IU] | Freq: Once | INTRAVENOUS | Status: AC
  Administered 2015-10-12: 500 [IU] via INTRAVENOUS

## 2015-10-12 MED ORDER — INFLUENZA VAC SPLIT QUAD 0.5 ML IM SUSY
0.5000 mL | PREFILLED_SYRINGE | Freq: Once | INTRAMUSCULAR | Status: AC
Start: 2015-10-12 — End: 2015-10-12
  Administered 2015-10-12: 0.5 mL via INTRAMUSCULAR
  Filled 2015-10-12: qty 0.5

## 2015-10-12 MED ORDER — HEPARIN SOD (PORK) LOCK FLUSH 100 UNIT/ML IV SOLN
INTRAVENOUS | Status: AC
  Filled 2015-10-12: qty 5

## 2015-10-12 MED ORDER — SODIUM CHLORIDE 0.9 % IJ SOLN
10.0000 mL | INTRAMUSCULAR | Status: DC | PRN
  Administered 2015-10-12: 10 mL via INTRAVENOUS
  Filled 2015-10-12: qty 10

## 2015-10-12 NOTE — Progress Notes (Signed)
Peter Bogus, MD [name] [id number]  Stage IIA adenocarcinoma of the colon  CURRENT THERAPY: Observation INTERVAL HISTORY: [name] 70 y.o. male returns for followup of Stage IIA adenocarcinoma of colon, S/P definitive surgery by Dr. Arnoldo Morale on 04/03/2015. He completed 7 cycles of adjuvant FOLFOX.  He did initially well but then developed significant fatigue. He developed an elevated bilirubin of uncertain etiology. Chemotherapy was held. An ultrasound of the liver was performed on 09/12/2015 that showed diffuse increase in liver echogenicity. This was felt to be most likely secondary to hepatic steatosis. CT of the abdomen and pelvis was performed on 09/18/2015 that showed circumferential wall thickening in the rectosigmoid junction and rectum with mild perirectal edema and inflammation. No evidence of metastatic disease. Stable left adrenal nodule. Persistent marked vascular collateralization in the left abdomen and nodularity to the liver contour.  He underwent a colonoscopy with Dr. Oneida Alar. A verrucous anal canal lesion was noted, biopsy was negative for malignancy. He complained of persistent dizziness and head MRI was negative. Today he notes his weight continues to increase, although he thinks he is doing better.  The patient and his wife report that his has been doing better.  He walks daily.  His wife notes that last week his urine looked "pink" but does not appear to have blood.    He will have a urine test completed today.  He will have his flu vaccination today.  He gets on the examination table with assistance.  He states that his dizziness has improved though some of his medications have the side effects of dizziness and he wonders if this is the cause.       Colon carcinoma ([place])   03/28/2015 Initial Biopsy Diagnosis 1. Colon, biopsy, left descending - INVASIVE ADENOCARCINOMA. 2. Stomach, biopsy - GASTRIC BODY AND ANTRAL-TYPE  MUCOSA WITH ASSOCIATED MINIMAL CHRONIC INFLAMMATION. - NO EVIDENCE OF HELICOBACTER PYLORI, INTESTINAL METAPLASIA, DYSPLASIA OR MA   03/29/2015 Imaging CT abd/pelvis- Circumferential mucosal lesion the proximal sigmoid colon consists with colorectal carcinoma.   04/03/2015 Definitive Surgery Colon, segmental resection for tumor - INVASIVE ADENOCARCINOMA, INVADING THROUGH THE MUSCULARIS PROPRIA INTO PERICOLONIC FATTY TISSUE. - FOURTEEN LYMPH NODES, NEGATIVE FOR METASTATIC CARCINOMA (0/14). - RESECTION MARGINS, NEGATIVE FOR ATYPIA OR MALIGNA   05/16/2015 - 08/22/2015 Chemotherapy FOLFOX    05/31/2015 Treatment Plan Change Oxaliplatin dose reduced by 20% secondary to fatigue from cycle 1 of therapy.   07/24/2015 Treatment Plan Change 5 FU bolus D/C'd for cycle 5 and subsequent cycles due to thrombocytopenia.   10/03/2015 Procedure colonoscopy with verrucous anal canal lesion, final path no malignancy   10/05/2015 Imaging MRI brain, normal for age    Malignant neoplasm of colon [facility])    Initial Diagnosis Malignant neoplasm of colon [facility])     Past Medical History  Diagnosis Date  . Varicose veins   . New onset atrial fibrillation ([place]) 01/02/2015  . Morbid obesity ([place]) 01/02/2015  . Hypertension   . Sleep apnea     been tested but has not received the CPAP yet  . GERD (gastroesophageal reflux disease)   . History of gout   . Cancer ([place])     colon  . Family history of colon cancer   . Family history of breast cancer in mother     has Varicose veins of lower extremities with other complications; New onset atrial fibrillation ([place]); Morbid obesity ([place]); Melena; Heme positive stool;  Esophageal dysphagia; Diarrhea; Occult blood in stools; Dysphagia, pharyngoesophageal phase; Colon carcinoma ([place]); Family history of colon cancer; Family history of breast cancer in mother; Cellulitis; Constipation; Elevated liver enzymes; Colon cancer ([place]); Change in bowel habits; Malignant neoplasm of colon ([place]); Peripheral  edema; Acute diastolic CHF (congestive heart failure) ([place]); Generalized weakness; DVT (deep venous thrombosis) ([place]); and Atrial fibrillation ([place]) on his problem list.   He believes that his sister died of heart issues, she died in her sleep.   has No Known Allergies.  No current facility-administered medications on file prior to visit.   Current Outpatient Prescriptions on File Prior to Visit  Medication Sig Dispense Refill  . escitalopram (LEXAPRO) 20 MG tablet Take 1 tablet (20 mg total) by mouth daily. 30 tablet 2  . lidocaine-prilocaine (EMLA) cream Apply a quarter size amount to port site 1 hour prior to chemo. Do not rub in. Cover with plastic wrap. 30 g 3  . Linaclotide (LINZESS) 145 MCG CAPS capsule 1 PO 30 mins prior to your first meal (Patient taking differently: Take 145 mcg by mouth daily. 1 PO 30 mins prior to your first meal) 30 capsule 11  . lisinopril (PRINIVIL,ZESTRIL) 10 MG tablet Take 1 tablet (10 mg total) by mouth daily. 30 tablet 12  . omeprazole (PRILOSEC) 20 MG capsule Take 1 capsule (20 mg total) by mouth daily. 30 capsule 3  . psyllium (METAMUCIL) 58.6 % packet Take 1 packet by mouth daily.      Past Surgical History  Procedure Laterality Date  . Vein ligation and stripping      left leg  . Colonoscopy N/A 03/28/2015    SLF: 1. Abdominal pain, diarrhea, rectal bleeding due to obstructing colon mass  . Esophagogastroduodenoscopy N/A 03/28/2015    SLF: 1. dysphagia 2. Mild non-erosive gastritis.   . Esophageal dilation N/A 03/28/2015    Procedure: ESOPHAGEAL DILATION;  Surgeon: [name], MD;  [facility];  Service: Endoscopy;  Laterality: N/A;  . Partial colectomy N/A 04/03/2015    Procedure: PARTIAL COLECTOMY;  Surgeon: [name], MD;  Location: [facility] ORS;  Service: General;  Laterality: N/A;  . Portacath placement N/A 05/10/2015    Procedure: INSERTION PORT-A-CATH;  Surgeon: [name], MD;  Location: [facility] ORS;  Service: General;   Laterality: N/A;  left subclavian  . Skin cancer destruction Left 08/16/15    left side of nose and left back  . Colonoscopy N/A 10/03/2015    Procedure: COLONOSCOPY;  Surgeon: [name], MD;  Location: [facility];  Service: Endoscopy;  Laterality: N/A;  . Colonoscopy N/A 10/02/2015    Procedure: COLONOSCOPY;  Surgeon: [name], MD;  Location: [facility];  Service: Endoscopy;  Laterality: N/A;  1315    Denies any headaches, dizziness, double vision, fevers, chills, night sweats, nausea, vomiting, diarrhea, constipation, chest pain, heart palpitations, shortness of breath, blood in stool, black tarry stool, urinary pain, urinary burning, urinary frequency, hematuria. Positive for dizziness, diarrhea 14 point review of systems was performed and is negative except as detailed under history of present illness and above    PHYSICAL EXAMINATION  ECOG PERFORMANCE STATUS: 1 - Symptomatic but completely ambulatory  Filed Vitals:   10/12/15 1000  BP: 118/57  Pulse: 75  Temp: 97.8 F (36.6 C)  Resp: 18    GENERAL:alert, no distress, well nourished, well developed,, cooperative, obese. He is smiling and very talkative he appears fatigued. Able to get onto the exam table  today with assistance. SKIN:  texture, turgor are normal, no rashes, +icterus HEAD: Normocephalic, No masses, lesions, tenderness or abnormalities EYES: normal, PERRLA, EOMI, Conjunctiva are pink and non-injected. Mild left eye drainage. EARS: External ears normal OROPHARYNX:lips, buccal mucosa, and tongue normal and mucous membranes are moist  NECK: supple, thyroid normal size, non-tender, without nodularity, trachea midline LYMPH:  not examined BREAST:not examined LUNGS: clear to auscultation  HEART: regular rate & rhythm ABDOMEN:abdomen soft, obese and normal bowel sounds BACK: Back symmetric, no curvature. EXTREMITIES:less then 2 second capillary refill, no joint deformities, effusion, or inflammation, no  skin discoloration      Left lower extremity is larger than the right. (chronic) with swelling and soreness Increased edema of both LE NEURO: alert & oriented x 3 with fluent speech, no focal motor/sensory deficits CN II - XII appear grossly intact  LABORATORY DATA: I have reviewed the data as listed.  Results for [name], [name] (MRN [id number])   Ref. Range 09/19/2015 09:01 09/19/2015 10:44  Sodium Latest Ref Range: 135-145 mmol/L 136   Potassium Latest Ref Range: 3.5-5.1 mmol/L 4.0   Chloride Latest Ref Range: 101-111 mmol/L 105   CO2 Latest Ref Range: 22-32 mmol/L 25   BUN Latest Ref Range: 6-20 mg/dL 7   Creatinine Latest Ref Range: 0.61-1.24 mg/dL 0.89   Calcium Latest Ref Range: 8.9-10.3 mg/dL 8.2 (L)   EGFR (Non-African Peter.) Latest Ref Range: >60 mL/min >60   EGFR (African American) Latest Ref Range: >60 mL/min >60   Glucose Latest Ref Range: 65-99 mg/dL 114 (H)   Anion gap Latest Ref Range: 5-15  6   Alkaline Phosphatase Latest Ref Range: 38-126 U/L 114   Albumin Latest Ref Range: 3.5-5.0 g/dL 2.4 (L)   AST Latest Ref Range: 15-41 U/L 54 (H)   ALT Latest Ref Range: 17-63 U/L 31   Total Protein Latest Ref Range: 6.5-8.1 g/dL 5.8 (L)   Bilirubin, Direct Latest Ref Range: <=0.2 mg/dL 0.6 (H) 0.9 (H)  Total Bilirubin Latest Ref Range: 0.3-1.2 mg/dL 3.2 (H)   Ferritin Latest Ref Range: 22-322 ng/mL  298  IgG (Immunoglobin G), Serum Latest Ref Range: 706-644-1274 mg/dL  1800 (H)  IgA Latest Ref Range: 68-379 mg/dL  804 (H)  IgM, Serum Latest Ref Range: 41-251 mg/dL  65  WBC Latest Ref Range: 4.0-10.5 K/uL 8.8   RBC Latest Ref Range: 4.22-5.81 MIL/uL 3.60 (L)   Hemoglobin Latest Ref Range: 13.0-17.0 g/dL 12.5 (L)   HCT Latest Ref Range: 39.0-52.0 % 36.9 (L)   MCV Latest Ref Range: 78.0-100.0 fL 102.5 (H)   MCH Latest Ref Range: 26.0-34.0 pg 34.7 (H)   MCHC Latest Ref Range: 30.0-36.0 g/dL 33.9   RDW Latest Ref Range: 11.5-15.5 % 16.0 (H)   Platelets Latest Ref Range: 150-400  K/uL SPECIMEN CHECKED ...   Neutrophils Latest Units: % 72   Lymphocytes Latest Units: % 13   Monocytes Relative Latest Units: % 13   Eosinophil Latest Units: % 3   Basophil Latest Units: % 0   NEUT# Latest Ref Range: 1.7-7.7 K/uL 6.4   Lymphocyte # Latest Ref Range: 0.7-4.0 K/uL 1.1   Monocyte # Latest Ref Range: 0.1-1.0 K/uL 1.1 (H)   Eosinophils Absolute Latest Ref Range: 0.0-0.7 K/uL 0.2   Basophils Absolute Latest Ref Range: 0.0-0.1 K/uL 0.0   Anit Nuclear Antibody(ANA) Latest Ref Range: NEGATIVE   NEG  Mitochondrial M2 Ab, IgG Latest Ref Range: <0.91   0.30  Smooth Muscle Ab Latest Ref Range: <20  U  21 (H)   Lab Results  Component Value Date   CEA 3.0 04/27/2015   CLINICAL DATA: Subsequent encounter for colon cancer with elevated bilirubin  EXAM: CT ABDOMEN AND PELVIS WITH CONTRAST  TECHNIQUE: Multidetector CT imaging of the abdomen and pelvis was performed using the standard protocol following bolus administration of intravenous contrast.  CONTRAST: 149m OMNIPAQUE IOHEXOL 300 MG/ML SOLN  COMPARISON: 03/29/2015.  FINDINGS: Lower chest: Unremarkable.  Hepatobiliary: No focal abnormality within the liver parenchyma. The liver shows diffusely decreased attenuation suggesting steatosis. There is no evidence for gallstones, gallbladder wall thickening, or pericholecystic fluid. No intrahepatic or extrahepatic biliary dilation.  Pancreas: No focal mass lesion. No dilatation of the main duct. No intraparenchymal cyst. No peripancreatic edema.  Spleen: No splenomegaly. No focal mass lesion.  Adrenals/Urinary Tract: 15 mm left adrenal nodule, not substantially changed in the interval. This nodule has very low attenuation and is likely a benign adrenal adenoma. The right adrenal gland is normal in appearance. No enhancing lesion in either kidney. No hydronephrosis. No evidence for hydroureter. The urinary bladder appears normal for the degree of  distention.  Stomach/Bowel: Stomach is nondistended. No gastric wall thickening. No evidence of outlet obstruction. Duodenum diverticulum noted. No small bowel wall thickening. No small bowel dilatation. The terminal ileum is normal. The appendix is normal. Surgical suture line noted in the left colon in the region of the proximal sigmoid segment. Circumferential wall thickening is seen to a mild degree in the distal sigmoid colon and rectum. There is mild associated perirectal edema/ inflammation.  Vascular/Lymphatic: No abdominal aortic aneurysm. There is marked vascular collateralization in the left abdomen as before. The portal vein and superior mesenteric vein are patent. Splenic vein is patent.  There is no gastrohepatic or hepatoduodenal ligament lymphadenopathy. No intraperitoneal or retroperitoneal lymphadenopy. No pelvic sidewall lymphadenopathy.  Reproductive: The prostate gland and seminal vesicles have normal imaging features.  Other: No intraperitoneal free fluid.  Musculoskeletal: Small right inguinal hernia contains only fat. 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 Bone windows reveal no worrisome lytic or sclerotic osseous lesions.  IMPRESSION: 1. Interval development of mild circumferential wall thickening in the rectosigmoid junction and rectum with mild perirectal edema/inflammation. Infectious or inflammatory colitis could have this appearance. Radiation change could also have these features on CT. 2. Interval resection of proximal sigmoid colon mass. 3. No CT findings to suggest metastatic disease in the abdomen or pelvis. 4. Stable left adrenal nodule, most likely adenoma. 5. Persistent marked vascular collateralization in the left abdomen. There is some nodularity to the liver contour, but imaging features are not definitive for cirrhosis. The portal vein and superior mesenteric vein are patent. No associated  splenomegaly.   Electronically Signed  By: EMisty StanleyM.D.  On: 09/18/2015 17:29  CLINICAL DATA: Weakness and syncope for 2 months. Dizziness. Personal history of colon cancer.  EXAM: MRI HEAD WITHOUT AND WITH CONTRAST  TECHNIQUE: Multiplanar, multiecho pulse sequences of the brain and surrounding structures were obtained without and with intravenous contrast.  CONTRAST: 27mMULTIHANCE GADOBENATE DIMEGLUMINE 529 MG/ML IV SOLN  COMPARISON: CT head without contrast 05/22/2013  FINDINGS: Mild atrophy is within normal limits for age. No acute infarct, hemorrhage, or mass lesion is present. The ventricles are of normal size. No significant extraaxial fluid collection is present.  No significant white matter disease is present. The internal auditory canals and brainstem are normal. Flow is present in the major intracranial arteries. The globes and orbits are  intact.  Of mucosal thickening is present along the floor of the maxillary sinuses bilaterally. The remaining paranasal sinuses are clear.  The skullbase is within normal limits. Midline structures are unremarkable.  There is some motion degradation of the study, particularly on the postcontrast images. No pathologic enhancement is present to suggest metastatic disease to the brain or meninges.  IMPRESSION: 1. Normal MRI appearance of the brain for age. 2. Minimal maxillary sinus disease.   Electronically Signed  By: San Morelle M.D.  On: 10/05/2015 10:11  ASSESSMENT AND PLAN:  Stage II colorectal cancer Adjuvant FOLFOX Family history of colon cancer Situational depression/anxiety Treatment related fatigue Elevated AST Hyperbilirubinemia, predominantly indirect Dizziness/Falls  He is certainly  better. Colonoscopy findings are reassuring. Head MRI is also reassuring. I am referring him to neurology for his ongoing complaints of vertigo. I am concerned about his weight gain and  his significant change and baseline lower extremity edema. I am going to refer him back to Dr. Harl Bowie.  I will tentatively schedule him for two-month follow-up. Flu vaccination was given today.  All questions were answered. The patient knows to call the clinic with any problems, questions or concerns. We can certainly see the patient much sooner if necessary.   This document serves as a record of services personally performed by Ancil Linsey, MD. It was created on her behalf by Janace Hoard, a trained medical scribe. The creation of this record is based on the scribe's personal observations and the provider's statements to them. This document has been checked and approved by the attending provider.  I have reviewed the above documentation for accuracy and completeness, and I agree with the above.  This note was electronically signed.  Kelby Fam. Whitney Muse, MD

## 2015-10-12 NOTE — Progress Notes (Signed)
..  [name] presented for Portacath access and flush.  Portacath located lt chest wall accessed with  H 20 needle.  Good blood return present. Portacath flushed with 56ml NS and 500U/26ml Heparin and needle removed intact.  Procedure tolerated well and without incident.   Marland KitchenBruna Beasley presents today for injection per the provider's orders.  Flu vaccine administration without incident; see MAR for injection details.  Patient tolerated procedure well and without incident.  No questions or complaints noted at this time.

## 2015-10-12 NOTE — Patient Instructions (Signed)
..  [place] at [facility] Discharge Instructions  RECOMMENDATIONS MADE BY THE CONSULTANT AND ANY TEST RESULTS WILL BE SENT TO YOUR REFERRING PHYSICIAN.  Urine specimen  today Flu vaccine today Referral back to Dr. Harl Bowie --10/14 at 4 pm Referral has been made to Dr. [name]    Thank you for choosing [place] at [facility] to provide your oncology and hematology care.  To afford each patient quality time with our provider, please arrive at least 15 minutes before your scheduled appointment time.    You need to re-schedule your appointment should you arrive 10 or more minutes late.  We strive to give you quality time with our providers, and arriving late affects you and other patients whose appointments are after yours.  Also, if you no show three or more times for appointments you may be dismissed from the clinic at the providers discretion.     Again, thank you for choosing [facility].  Our hope is that these requests will decrease the amount of time that you wait before being seen by our physicians.       _____________________________________________________________  Should you have questions after your visit to [facility], please contact our office at [phone] between the hours of 8:30 a.m. and 4:30 p.m.  Voicemails left after 4:30 p.m. will not be returned until the following business day.  For prescription refill requests, have your pharmacy contact our office.

## 2015-10-13 ENCOUNTER — Encounter: Payer: Self-pay | Admitting: Cardiology

## 2015-10-13 ENCOUNTER — Ambulatory Visit (INDEPENDENT_AMBULATORY_CARE_PROVIDER_SITE_OTHER): Payer: Medicare Other | Admitting: Cardiology

## 2015-10-13 VITALS — BP 114/68 | HR 90 | Ht 67.0 in | Wt 273.8 lb

## 2015-10-13 DIAGNOSIS — R601 Generalized edema: Secondary | ICD-10-CM

## 2015-10-13 MED ORDER — POTASSIUM CHLORIDE CRYS ER 20 MEQ PO TBCR: 20.0000 meq | EXTENDED_RELEASE_TABLET | Freq: Every day | ORAL | Status: DC

## 2015-10-13 MED ORDER — FUROSEMIDE 20 MG PO TABS
20.0000 mg | ORAL_TABLET | Freq: Every day | ORAL | Status: DC
Start: 2015-10-13 — End: 2015-10-24

## 2015-10-13 NOTE — Progress Notes (Signed)
[name], male   DOB: [birth year], 69 y.o.   MRN: [id number]     Clinical Summary [name] is a 69 y.o.male seen today for follow up of the following medical problems. This is a focused visit on recent LE edema.   1. Leg swelling - bilateral edema that has been increasing over the last week - denies any DOE or SOB - notes show weight up 20 lbs since early 08/2015 - recent echo 08/2015 LVEF 60-65%, cannot eval diastolic function due to afib,   Past Medical History  Diagnosis Date  . Varicose veins   . New onset atrial fibrillation ([place]) 01/02/2015  . Morbid obesity ([place]) 01/02/2015  . Hypertension   . Dysrhythmia     Afib- on set 12/2014  . Sleep apnea     been tested but has not received the CPAP yet  . GERD (gastroesophageal reflux disease)   . History of gout   . Cancer ([place])     colon  . Family history of colon cancer   . Family history of breast cancer in mother      Not on File   Current Outpatient Prescriptions  Medication Sig Dispense Refill  . ciprofloxacin (CILOXAN) 0.3 % ophthalmic solution Administer 1 drop, every 2 hours, while awake, for 2 days. Then 1 drop, every 4 hours, while awake, for the next 5 days. 5 mL 0  . ELIQUIS 5 MG TABS tablet TK 1 T PO BID  12  . escitalopram (LEXAPRO) 20 MG tablet Take 1 tablet (20 mg total) by mouth daily. 30 tablet 2  . HYDROcodone-acetaminophen (NORCO/VICODIN) 5-325 MG per tablet Take 1-2 tablets by mouth every 6 (six) hours as needed for moderate pain. (Patient not taking: Reported on 09/28/2015) 40 tablet 0  . ibuprofen (ADVIL,MOTRIN) 200 MG tablet Take 400 mg by mouth every 6 (six) hours as needed for mild pain or moderate pain (for knee pain).    [name] lidocaine-prilocaine (EMLA) cream Apply a quarter size amount to port site 1 hour prior to chemo. Do not rub in. Cover with plastic wrap. 30 g 3  . Linaclotide (LINZESS) 145 MCG CAPS capsule 1 PO 30 mins prior to your first meal 30 capsule 11  . lisinopril  (PRINIVIL,ZESTRIL) 10 MG tablet Take 1 tablet (10 mg total) by mouth daily. 30 tablet 12  . nitroGLYCERIN (NITROGLYN) 2 % ointment Apply 0.5 inches topically as needed for chest pain. For rectal area    . omeprazole (PRILOSEC) 20 MG capsule Take 1 capsule (20 mg total) by mouth daily. 30 capsule 3  . ondansetron (ZOFRAN) 8 MG tablet Take 1 tablet (8 mg total) by mouth every 8 (eight) hours as needed. (Patient not taking: Reported on 09/28/2015) 30 tablet 3  . potassium chloride SA (K-DUR,KLOR-CON) 20 MEQ tablet Take 1 tablet (20 mEq total) by mouth 2 (two) times daily. (Patient not taking: Reported on 08/05/2015) 14 tablet 0  . prochlorperazine (COMPAZINE) 10 MG tablet Take 1 tablet (10 mg total) by mouth every 6 (six) hours as needed for nausea or vomiting. (Patient not taking: Reported on 09/20/2015) 30 tablet 3  . psyllium (METAMUCIL) 58.6 % packet Take 1 packet by mouth daily.     No current facility-administered medications for this visit.   Facility-Administered Medications Ordered in Other Visits  Medication Dose Route Frequency Provider Last Rate Last Dose  . sodium chloride 0.9 % injection 10 mL  10 mL Intravenous PRN [name], MD  10 mL at 10/12/15 1128     Past Surgical History  Procedure Laterality Date  . Vein ligation and stripping      left leg  . Colonoscopy N/A 03/28/2015    SLF: 1. Abdominal pain, diarrhea, rectal bleeding due to obstructing colon mass  . Esophagogastroduodenoscopy N/A 03/28/2015    SLF: 1. dysphagia 2. Mild non-erosive gastritis.   . Esophageal dilation N/A 03/28/2015    Procedure: ESOPHAGEAL DILATION;  Surgeon: [name], MD;  [facility];  Service: Endoscopy;  Laterality: N/A;  . Partial colectomy N/A 04/03/2015    Procedure: PARTIAL COLECTOMY;  Surgeon: [name], MD;  Location: [facility];  Service: General;  Laterality: N/A;  . Portacath placement N/A 05/10/2015    Procedure: INSERTION PORT-A-CATH;  Surgeon: [name], MD;   Location: [facility];  Service: General;  Laterality: N/A;  left subclavian  . Skin cancer destruction Left 08/16/15    left side of nose and left back  . Colonoscopy N/A 10/03/2015    Procedure: COLONOSCOPY;  Surgeon: [name], MD;  [facility];  Service: Endoscopy;  Laterality: N/A;  . Colonoscopy N/A 10/02/2015    Procedure: COLONOSCOPY;  Surgeon: [name], MD;  [facility];  Service: Endoscopy;  Laterality: N/A;  1315     Not on File    Family History  Problem Relation Age of Onset  . Breast cancer Mother     dx under 73  . Colon cancer Mother     dx in her mid 59s  . Heart disease Father   . Hyperlipidemia Father   . Liver cancer Father     heavy ETOH user when young  . Cancer Sister     slow "blood" cancer  . Diabetes Sister   . Diabetes Brother   . Cancer Maternal Aunt     2-3 maternal aunts with Cancer NOS  . Lung cancer Paternal Uncle     three uncles with lung cancer - all smokers  . Cancer Paternal Grandmother     possible bone cancer     Social History [name] reports that he has never smoked. He has never used smokeless tobacco. [name] reports that he does not drink alcohol.   Review of Systems CONSTITUTIONAL: No weight loss, fever, chills, weakness or fatigue.  HEENT: Eyes: No visual loss, blurred vision, double vision or yellow sclerae.No hearing loss, sneezing, congestion, runny nose or sore throat.  SKIN: No rash or itching.  CARDIOVASCULAR: per HPI RESPIRATORY: No shortness of breath, cough or sputum.  GASTROINTESTINAL: No anorexia, nausea, vomiting or diarrhea. No abdominal pain or blood.  GENITOURINARY: No burning on urination, no polyuria NEUROLOGICAL: No headache, dizziness, syncope, paralysis, ataxia, numbness or tingling in the extremities. No change in bowel or bladder control.  MUSCULOSKELETAL: No muscle, back pain, joint pain or stiffness.  LYMPHATICS: No enlarged nodes. No history of splenectomy.    PSYCHIATRIC: No history of depression or anxiety.  ENDOCRINOLOGIC: No reports of sweating, cold or heat intolerance. No polyuria or polydipsia.  [name]   Physical Examination Filed Vitals:   10/13/15 1555  BP: 114/68  Pulse: 90   Filed Vitals:   10/13/15 1555  Height: 5\' 7"  (1.702 m)  Weight: 273 lb 12.8 oz (124.195 kg)    Gen: resting comfortably, no acute distress HEENT: no scleral icterus, pupils equal round and reactive, no palptable cervical adenopathy,  CV: irreg, no m/r/g, no jvd Resp: Clear  to auscultation bilaterally GI: abdomen is soft, non-tender, non-distended, normal bowel sounds, no hepatosplenomegaly MSK: extremities are warm, 2-3+ LE edema Skin: warm, no rash Neuro:  no focal deficits Psych: appropriate affect   Diagnostic Studies  08/2015 echo Study Conclusions  - Left ventricle: Systolic function was normal. The estimated ejection fraction was in the range of 60% to 65%. - Mitral valve: There was mild regurgitation. - Left atrium: The atrium was moderately dilated. - Pulmonary arteries: PA peak pressure: 33 mm Hg (S).  Jan 2016 MPI IMPRESSION: 1. No reversible ischemia or infarction.  2. Normal left ventricular wall motion.  3. Left ventricular ejection fraction 58%  4. Low-risk stress test findings*.   Assessment and Plan   1. LE edema - significant leg edema with 20 lbs weight gain over the last month - recent echo normal LVEF, likely diastolic dysfunction however cannot describe based on afib - he is lasix naive, will start 20mg  daily along with 20 mEq of KCl daily. Check BMET, Mg, TSH next week along with f/u appointment next week        [name], M.D.

## 2015-10-13 NOTE — Patient Instructions (Signed)
Your physician recommends that you schedule a follow-up appointment in: next Thursday with Arnold Long NP  Get lab work on Wednesday  Tsh,magnesium,bmet    START lasix 20 mg daily in the am  START Potassium 20 meq daily in the am   Weigh self every day and call us Monday with weight     Thank you for choosing [facility] !

## 2015-10-14 ENCOUNTER — Emergency Department (HOSPITAL_COMMUNITY)
Admission: EM | Admit: 2015-10-14 | Discharge: 2015-10-14 | Disposition: A | Discharge status: Home / Self Care | Payer: Medicare Other | Source: Home / Self Care | Attending: Emergency Medicine | Admitting: Emergency Medicine

## 2015-10-14 ENCOUNTER — Other Ambulatory Visit (HOSPITAL_COMMUNITY): Payer: Self-pay | Admitting: Emergency Medicine

## 2015-10-14 ENCOUNTER — Encounter (HOSPITAL_COMMUNITY): Payer: Self-pay | Admitting: Emergency Medicine

## 2015-10-14 DIAGNOSIS — R7989 Other specified abnormal findings of blood chemistry: Secondary | ICD-10-CM

## 2015-10-14 DIAGNOSIS — R600 Localized edema: Secondary | ICD-10-CM

## 2015-10-14 DIAGNOSIS — M7989 Other specified soft tissue disorders: Secondary | ICD-10-CM

## 2015-10-14 LAB — CBC WITH DIFFERENTIAL/PLATELET
BASOS ABS: 0 10*3/uL (ref 0.0–0.1)
Basophils Relative: 0 %
Eosinophils Absolute: 0.2 10*3/uL (ref 0.0–0.7)
Eosinophils Relative: 5 %
HEMATOCRIT: 33.5 % — AB (ref 39.0–52.0)
Hemoglobin: 11.3 g/dL — ABNORMAL LOW (ref 13.0–17.0)
LYMPHS ABS: 0.9 10*3/uL (ref 0.7–4.0)
Lymphocytes Relative: 28 %
MCH: 34.8 pg — AB (ref 26.0–34.0)
MCHC: 33.7 g/dL (ref 30.0–36.0)
MCV: 103.1 fL — AB (ref 78.0–100.0)
MONO ABS: 0.5 10*3/uL (ref 0.1–1.0)
MONOS PCT: 15 %
Neutro Abs: 1.8 10*3/uL (ref 1.7–7.7)
Neutrophils Relative %: 53 %
Platelets: 83 10*3/uL — ABNORMAL LOW (ref 150–400)
RBC: 3.25 MIL/uL — ABNORMAL LOW (ref 4.22–5.81)
RDW: 14.6 % (ref 11.5–15.5)
WBC: 3.4 10*3/uL — AB (ref 4.0–10.5)

## 2015-10-14 LAB — COMPREHENSIVE METABOLIC PANEL
ALT: 36 U/L (ref 17–63)
AST: 71 U/L — AB (ref 15–41)
Albumin: 2.2 g/dL — ABNORMAL LOW (ref 3.5–5.0)
Alkaline Phosphatase: 133 U/L — ABNORMAL HIGH (ref 38–126)
Anion gap: 2 — ABNORMAL LOW (ref 5–15)
BILIRUBIN TOTAL: 2.1 mg/dL — AB (ref 0.3–1.2)
BUN: 7 mg/dL (ref 6–20)
CO2: 27 mmol/L (ref 22–32)
CREATININE: 0.75 mg/dL (ref 0.61–1.24)
Calcium: 8.7 mg/dL — ABNORMAL LOW (ref 8.9–10.3)
Chloride: 107 mmol/L (ref 101–111)
GFR calc Af Amer: >60 mL/min (ref >60)
Glucose, Bld: 102 mg/dL — ABNORMAL HIGH (ref 65–99)
POTASSIUM: 4.6 mmol/L (ref 3.5–5.1)
Sodium: 136 mmol/L (ref 135–145)
TOTAL PROTEIN: 6.1 g/dL — AB (ref 6.5–8.1)

## 2015-10-14 LAB — BRAIN NATRIURETIC PEPTIDE: B NATRIURETIC PEPTIDE 5: 432 pg/mL — AB (ref 0.0–100.0)

## 2015-10-14 LAB — D-DIMER, QUANTITATIVE: D-Dimer, Quant: 1.14 ug/mL-FEU — ABNORMAL HIGH (ref 0.00–0.48)

## 2015-10-14 NOTE — ED Provider Notes (Signed)
CSN: [id number]     Arrival date & time 10/14/15  1247 History   First MD Initiated Contact with Patient 10/14/15 1302     Chief Complaint  Patient presents with  . Foot Swelling     (Consider location/radiation/quality/duration/timing/severity/associated sxs/prior Treatment) HPI Patient presents with concern of bilateral lower extremity swelling. Symptoms seem to be present for some time, though possibly worse over the past week or so. Notable, the patient's eyes physician yesterday, was started on Lasix, but has taken only one dose thus far. There is no pain, dyspnea, chest discomfort. However, given the increased bilateral lower extremity symptoms, he presents for evaluation.  Past Medical History  Diagnosis Date  . Varicose veins   . New onset atrial fibrillation ([place]) 01/02/2015  . Morbid obesity ([place]) 01/02/2015  . Hypertension   . Dysrhythmia     Afib- on set 12/2014  . Sleep apnea     been tested but has not received the CPAP yet  . GERD (gastroesophageal reflux disease)   . History of gout   . Cancer ([place])     colon  . Family history of colon cancer   . Family history of breast cancer in mother    Past Surgical History  Procedure Laterality Date  . Vein ligation and stripping      left leg  . Colonoscopy N/A 03/28/2015    SLF: 1. Abdominal pain, diarrhea, rectal bleeding due to obstructing colon mass  . Esophagogastroduodenoscopy N/A 03/28/2015    SLF: 1. dysphagia 2. Mild non-erosive gastritis.   . Esophageal dilation N/A 03/28/2015    Procedure: ESOPHAGEAL DILATION;  Surgeon: [name], MD;  [facility];  Service: Endoscopy;  Laterality: N/A;  . Partial colectomy N/A 04/03/2015    Procedure: PARTIAL COLECTOMY;  Surgeon: [name], MD;  Location: [facility];  Service: General;  Laterality: N/A;  . Portacath placement N/A 05/10/2015    Procedure: INSERTION PORT-A-CATH;  Surgeon: [name], MD;  Location: [facility];  Service: General;  Laterality:  N/A;  left subclavian  . Skin cancer destruction Left 08/16/15    left side of nose and left back  . Colonoscopy N/A 10/03/2015    Procedure: COLONOSCOPY;  Surgeon: [name], MD;  [facility];  Service: Endoscopy;  Laterality: N/A;  . Colonoscopy N/A 10/02/2015    Procedure: COLONOSCOPY;  Surgeon: [name], MD;  [facility];  Service: Endoscopy;  Laterality: N/A;  1315   Family History  Problem Relation Age of Onset  . Breast cancer Mother     dx under 37  . Colon cancer Mother     dx in her mid 45s  . Heart disease Father   . Hyperlipidemia Father   . Liver cancer Father     heavy ETOH user when young  . Cancer Sister     slow "blood" cancer  . Diabetes Sister   . Diabetes Brother   . Cancer Maternal Aunt     2-3 maternal aunts with Cancer NOS  . Lung cancer Paternal Uncle     three uncles with lung cancer - all smokers  . Cancer Paternal Grandmother     possible bone cancer   Social History  Substance Use Topics  . Smoking status: Never Smoker   . Smokeless tobacco: Never Used  . Alcohol Use: No    Review of Systems  Constitutional:       Per HPI, otherwise negative  HENT:       Per HPI, otherwise negative  Respiratory:       Per HPI, otherwise negative  Cardiovascular:       Per HPI, otherwise negative  Gastrointestinal: Negative for vomiting.  Endocrine:       Negative aside from HPI  Genitourinary:       Neg aside from HPI   Musculoskeletal:       Per HPI, otherwise negative  Skin: Positive for color change.  Allergic/Immunologic: Negative for immunocompromised state.  Neurological: Negative for syncope.      Allergies  Review of patient's allergies indicates no known allergies.  Home Medications   Prior to Admission medications   Medication Sig Start Date End Date Taking? Authorizing Provider  ciprofloxacin (CILOXAN) 0.3 % ophthalmic solution Place 1 drop into the left eye every 4 (four) hours while awake.   08/22/15  Yes Historical Provider, MD  ELIQUIS 5 MG TABS tablet TK 1 T PO BID 08/16/15  Yes Historical Provider, MD  escitalopram (LEXAPRO) 20 MG tablet Take 1 tablet (20 mg total) by mouth daily. 07/17/15  Yes [name], PA-C  furosemide (LASIX) 20 MG tablet Take 1 tablet (20 mg total) by mouth daily. 10/13/15  Yes [name], MD  lidocaine-prilocaine (EMLA) cream Apply a quarter size amount to port site 1 hour prior to chemo. Do not rub in. Cover with plastic wrap. 04/28/15  Yes [name], MD  Linaclotide Rolan Lipa) 145 MCG CAPS capsule 1 PO 30 mins prior to your first meal 10/03/15  Yes [name], MD  lisinopril (PRINIVIL,ZESTRIL) 10 MG tablet Take 1 tablet (10 mg total) by mouth daily. 01/04/15  Yes [name], MD  nitroGLYCERIN (NITROGLYN) 2 % ointment Apply 0.5 inches topically as needed for chest pain. For rectal area   Yes Historical Provider, MD  omeprazole (PRILOSEC) 20 MG capsule Take 1 capsule (20 mg total) by mouth daily. 03/15/15  Yes [name], PA-C  potassium chloride SA (K-DUR,KLOR-CON) 20 MEQ tablet Take 1 tablet (20 mEq total) by mouth daily. 10/13/15  Yes [name], MD  psyllium (METAMUCIL) 58.6 % packet Take 1 packet by mouth daily.   Yes Historical Provider, MD   BP 109/67 mmHg  Pulse 72  Temp(Src) 97.4 F (36.3 C) (Oral)  Resp 16  Ht 5\' 7"  (1.702 m)  Wt 273 lb (123.832 kg)  BMI 42.75 kg/m2  SpO2 100% Physical Exam  Constitutional: He is oriented to person, place, and time. He appears well-developed. No distress.  HENT:  Head: Normocephalic and atraumatic.  Very poor dentition  Eyes: Conjunctivae and EOM are normal.  Cardiovascular: Normal rate and regular rhythm.   Pulmonary/Chest: Effort normal. No stridor. No respiratory distress.  Abdominal: He exhibits no distension.  Musculoskeletal: He exhibits edema.  Symmetric LE edema throughout both LE  Neurological: He is alert and oriented to person, place, and time.  Skin: Skin is  dry.     Psychiatric: He has a normal mood and affect.  Nursing note and vitals reviewed.   ED Course  Procedures (including critical care time) Labs Review Labs Reviewed  CBC WITH DIFFERENTIAL/PLATELET - Abnormal; Notable for the following:    WBC 3.4 (*)    RBC 3.25 (*)    Hemoglobin 11.3 (*)    HCT 33.5 (*)    MCV 103.1 (*)    MCH 34.8 (*)    Platelets 83 (*)    All other components within normal limits  COMPREHENSIVE METABOLIC PANEL - Abnormal; Notable for the following:    Glucose, Bld 102 (*)    Calcium 8.7 (*)    Total Protein 6.1 (*)    Albumin 2.2 (*)    AST 71 (*)    Alkaline Phosphatase 133 (*)    Total Bilirubin 2.1 (*)    Anion gap 2 (*)    All other components within normal limits  BRAIN NATRIURETIC PEPTIDE - Abnormal; Notable for the following:    B Natriuretic Peptide 432.0 (*)    All other components within normal limits  D-DIMER, QUANTITATIVE (NOT AT [facility]) - Abnormal; Notable for the following:    D-Dimer, Quant 1.14 (*)    All other components within normal limits    I have personally reviewed and evaluated these lab results as part of my medical decision-making.   EMR notable for recent ECHO w no substantial HF.  Labs notable for elevated dimer, BNP, which is unusual, given the patient's recent echocardiogram. However, the patient has had prior elevated D dimer before.  On repeat exam the patient remains in similar condition, no complaints. I discussed all findings with patient and his family members. Patient is not currently available, and the patient will return in the morning for outpatient ultrasound, and will follow-up with his physician as well. Given the elevated BNP, patient was encouraged to continue taking Lasix, pending outpatient follow-up in one week.   MDM   Final diagnoses:  Swelling of lower extremity  Elevated brain natriuretic peptide (BNP) level   presents with new bilateral lower extremity swelling, worsening over the  past days. Here, patient is awake and alert, no evidence for pulmonary embolism, nor systemic pathology. Some consideration for DVT, patient d-dimer performed, which was positive, requiring follow-up tomorrow. Presentation more consistent with heart failure versus venous insufficiency. Patient had increased Lasix dosing, will follow with primary care as well.   Carmin Muskrat, MD 10/14/15 [phone]

## 2015-10-14 NOTE — ED Notes (Signed)
Pt c/o right foot swelling and redness since Wednesday.  Pt also has swelling to left foot. Pt started on lasix yesterday by heart doctor.

## 2015-10-14 NOTE — Discharge Instructions (Signed)
As discussed, please take all medication as directed, and be sure to return tomorrow for outpatient ultrasound.  For the next five days, please take 40mg  Lasix  Return here for concerning changes in your condition in the interim.  Otherwise, please be sure to follow-up with your physician as scheduled this week.

## 2015-10-15 ENCOUNTER — Emergency Department (HOSPITAL_COMMUNITY): Payer: Medicare Other

## 2015-10-15 ENCOUNTER — Inpatient Hospital Stay (HOSPITAL_COMMUNITY)
Admission: EM | Admit: 2015-10-15 | Discharge: 2015-10-24 | DRG: 292 | Disposition: A | Discharge status: Home Health | Payer: Medicare Other | Attending: Pulmonary Disease | Admitting: Pulmonary Disease

## 2015-10-15 ENCOUNTER — Ambulatory Visit (HOSPITAL_COMMUNITY)
Admission: RE | Admit: 2015-10-15 | Discharge: 2015-10-15 | Disposition: A | Discharge status: Home / Self Care | Payer: Medicare Other | Source: Ambulatory Visit | Attending: Emergency Medicine | Admitting: Emergency Medicine

## 2015-10-15 ENCOUNTER — Encounter (HOSPITAL_COMMUNITY): Payer: Self-pay | Admitting: Emergency Medicine

## 2015-10-15 DIAGNOSIS — R296 Repeated falls: Secondary | ICD-10-CM | POA: Diagnosis present

## 2015-10-15 DIAGNOSIS — I5033 Acute on chronic diastolic (congestive) heart failure: Secondary | ICD-10-CM | POA: Diagnosis present

## 2015-10-15 DIAGNOSIS — R748 Abnormal levels of other serum enzymes: Secondary | ICD-10-CM | POA: Diagnosis present

## 2015-10-15 DIAGNOSIS — R410 Disorientation, unspecified: Secondary | ICD-10-CM

## 2015-10-15 DIAGNOSIS — R7989 Other specified abnormal findings of blood chemistry: Secondary | ICD-10-CM | POA: Diagnosis present

## 2015-10-15 DIAGNOSIS — Z801 Family history of malignant neoplasm of trachea, bronchus and lung: Secondary | ICD-10-CM

## 2015-10-15 DIAGNOSIS — I82401 Acute embolism and thrombosis of unspecified deep veins of right lower extremity: Secondary | ICD-10-CM

## 2015-10-15 DIAGNOSIS — C188 Malignant neoplasm of overlapping sites of colon: Secondary | ICD-10-CM

## 2015-10-15 DIAGNOSIS — Z8249 Family history of ischemic heart disease and other diseases of the circulatory system: Secondary | ICD-10-CM

## 2015-10-15 DIAGNOSIS — C19 Malignant neoplasm of rectosigmoid junction: Secondary | ICD-10-CM | POA: Diagnosis present

## 2015-10-15 DIAGNOSIS — R531 Weakness: Secondary | ICD-10-CM

## 2015-10-15 DIAGNOSIS — I4891 Unspecified atrial fibrillation: Secondary | ICD-10-CM | POA: Diagnosis present

## 2015-10-15 DIAGNOSIS — Z800 Family history of malignant neoplasm of digestive organs: Secondary | ICD-10-CM

## 2015-10-15 DIAGNOSIS — I82409 Acute embolism and thrombosis of unspecified deep veins of unspecified lower extremity: Secondary | ICD-10-CM | POA: Diagnosis present

## 2015-10-15 DIAGNOSIS — R609 Edema, unspecified: Secondary | ICD-10-CM | POA: Diagnosis present

## 2015-10-15 DIAGNOSIS — Z6841 Body Mass Index (BMI) 40.0 and over, adult: Secondary | ICD-10-CM

## 2015-10-15 DIAGNOSIS — I110 Hypertensive heart disease with heart failure: Principal | ICD-10-CM | POA: Diagnosis present

## 2015-10-15 DIAGNOSIS — M4712 Other spondylosis with myelopathy, cervical region: Secondary | ICD-10-CM | POA: Diagnosis present

## 2015-10-15 DIAGNOSIS — F419 Anxiety disorder, unspecified: Secondary | ICD-10-CM | POA: Diagnosis present

## 2015-10-15 DIAGNOSIS — Z833 Family history of diabetes mellitus: Secondary | ICD-10-CM

## 2015-10-15 DIAGNOSIS — Z7901 Long term (current) use of anticoagulants: Secondary | ICD-10-CM

## 2015-10-15 DIAGNOSIS — R2689 Other abnormalities of gait and mobility: Secondary | ICD-10-CM

## 2015-10-15 DIAGNOSIS — R945 Abnormal results of liver function studies: Secondary | ICD-10-CM | POA: Diagnosis present

## 2015-10-15 DIAGNOSIS — R1314 Dysphagia, pharyngoesophageal phase: Secondary | ICD-10-CM | POA: Diagnosis present

## 2015-10-15 DIAGNOSIS — I82411 Acute embolism and thrombosis of right femoral vein: Secondary | ICD-10-CM | POA: Diagnosis present

## 2015-10-15 DIAGNOSIS — I5031 Acute diastolic (congestive) heart failure: Secondary | ICD-10-CM

## 2015-10-15 DIAGNOSIS — R159 Full incontinence of feces: Secondary | ICD-10-CM | POA: Diagnosis present

## 2015-10-15 DIAGNOSIS — C189 Malignant neoplasm of colon, unspecified: Secondary | ICD-10-CM | POA: Diagnosis not present

## 2015-10-15 DIAGNOSIS — C4490 Unspecified malignant neoplasm of skin, unspecified: Secondary | ICD-10-CM | POA: Diagnosis present

## 2015-10-15 DIAGNOSIS — G4733 Obstructive sleep apnea (adult) (pediatric): Secondary | ICD-10-CM | POA: Diagnosis present

## 2015-10-15 DIAGNOSIS — R4182 Altered mental status, unspecified: Secondary | ICD-10-CM

## 2015-10-15 DIAGNOSIS — I824Y1 Acute embolism and thrombosis of unspecified deep veins of right proximal lower extremity: Secondary | ICD-10-CM | POA: Diagnosis not present

## 2015-10-15 DIAGNOSIS — K219 Gastro-esophageal reflux disease without esophagitis: Secondary | ICD-10-CM | POA: Diagnosis present

## 2015-10-15 DIAGNOSIS — R600 Localized edema: Secondary | ICD-10-CM

## 2015-10-15 DIAGNOSIS — Z9049 Acquired absence of other specified parts of digestive tract: Secondary | ICD-10-CM

## 2015-10-15 DIAGNOSIS — I482 Chronic atrial fibrillation: Secondary | ICD-10-CM | POA: Diagnosis present

## 2015-10-15 DIAGNOSIS — F4321 Adjustment disorder with depressed mood: Secondary | ICD-10-CM | POA: Diagnosis present

## 2015-10-15 DIAGNOSIS — G40909 Epilepsy, unspecified, not intractable, without status epilepticus: Secondary | ICD-10-CM | POA: Diagnosis present

## 2015-10-15 DIAGNOSIS — Z9221 Personal history of antineoplastic chemotherapy: Secondary | ICD-10-CM

## 2015-10-15 MED ORDER — LISINOPRIL 10 MG PO TABS
10.0000 mg | ORAL_TABLET | Freq: Every day | ORAL | Status: DC
  Administered 2015-10-15 – 2015-10-24 (×8): 10 mg via ORAL
  Filled 2015-10-15 (×9): qty 1

## 2015-10-15 MED ORDER — LINACLOTIDE 145 MCG PO CAPS
145.0000 ug | ORAL_CAPSULE | Freq: Every day | ORAL | Status: DC
  Administered 2015-10-15 – 2015-10-16 (×2): 145 ug via ORAL
  Filled 2015-10-15 (×3): qty 1

## 2015-10-15 MED ORDER — CIPROFLOXACIN HCL 0.3 % OP SOLN
1.0000 [drp] | OPHTHALMIC | Status: DC
  Administered 2015-10-15 – 2015-10-24 (×38): 1 [drp] via OPHTHALMIC
  Filled 2015-10-15: qty 2.5

## 2015-10-15 MED ORDER — APIXABAN 5 MG PO TABS
5.0000 mg | ORAL_TABLET | Freq: Two times a day (BID) | ORAL | Status: DC
  Administered 2015-10-15: 5 mg via ORAL
  Filled 2015-10-15: qty 1

## 2015-10-15 MED ORDER — SODIUM CHLORIDE 0.9 % IV SOLN: 250.0000 mL | INTRAVENOUS | Status: DC | PRN

## 2015-10-15 MED ORDER — APIXABAN 5 MG PO TABS
10.0000 mg | ORAL_TABLET | Freq: Two times a day (BID) | ORAL | Status: DC
  Administered 2015-10-15 – 2015-10-18 (×7): 10 mg via ORAL
  Filled 2015-10-15 (×7): qty 2

## 2015-10-15 MED ORDER — POTASSIUM CHLORIDE CRYS ER 20 MEQ PO TBCR
20.0000 meq | EXTENDED_RELEASE_TABLET | Freq: Every day | ORAL | Status: DC
  Administered 2015-10-15 – 2015-10-16 (×2): 20 meq via ORAL
  Filled 2015-10-15 (×3): qty 1

## 2015-10-15 MED ORDER — PSYLLIUM 95 % PO PACK
1.0000 | PACK | Freq: Every day | ORAL | Status: DC
  Administered 2015-10-15 – 2015-10-17 (×3): 1 via ORAL
  Filled 2015-10-15 (×9): qty 1

## 2015-10-15 MED ORDER — ESCITALOPRAM OXALATE 10 MG PO TABS
20.0000 mg | ORAL_TABLET | Freq: Every day | ORAL | Status: DC
  Administered 2015-10-15 – 2015-10-24 (×8): 20 mg via ORAL
  Filled 2015-10-15 (×9): qty 2

## 2015-10-15 MED ORDER — SODIUM CHLORIDE 0.9 % IJ SOLN: 3.0000 mL | INTRAMUSCULAR | Status: DC | PRN

## 2015-10-15 MED ORDER — SODIUM CHLORIDE 0.9 % IJ SOLN
3.0000 mL | Freq: Two times a day (BID) | INTRAMUSCULAR | Status: DC
  Administered 2015-10-15 – 2015-10-23 (×9): 3 mL via INTRAVENOUS

## 2015-10-15 MED ORDER — FUROSEMIDE 10 MG/ML IJ SOLN
40.0000 mg | Freq: Two times a day (BID) | INTRAMUSCULAR | Status: DC
  Administered 2015-10-15 – 2015-10-18 (×6): 40 mg via INTRAVENOUS
  Filled 2015-10-15 (×6): qty 4

## 2015-10-15 MED ORDER — ONDANSETRON HCL 4 MG/2ML IJ SOLN: 4.0000 mg | Freq: Four times a day (QID) | INTRAMUSCULAR | Status: DC | PRN

## 2015-10-15 MED ORDER — ACETAMINOPHEN 325 MG PO TABS: 650.0000 mg | ORAL_TABLET | ORAL | Status: DC | PRN

## 2015-10-15 MED ORDER — PANTOPRAZOLE SODIUM 40 MG PO TBEC
40.0000 mg | DELAYED_RELEASE_TABLET | Freq: Every day | ORAL | Status: DC
  Administered 2015-10-15 – 2015-10-17 (×3): 40 mg via ORAL
  Filled 2015-10-15 (×4): qty 1

## 2015-10-15 NOTE — ED Provider Notes (Signed)
CSN: [id number]     Arrival date & time 10/15/15  1046 History  By signing my name below, I, [name], attest that this documentation has been prepared under the direction and in the presence of Davonna Belling, MD. Electronically Signed: Irene Beasley, ED Scribe. 10/15/2015. 1:09 PM.  Chief Complaint  Patient presents with  . DVT   The history is provided by the patient and a relative. No language interpreter was used.  HPI Comments: [name] is a 69 y.o. Male with hx of AFib, cancer, HTN, and goutwho presents to the Emergency Department complaining of gradually worsening leg swelling and redness onset one week ago. Pt states that his symptoms started last week and worsened yesterday, for which he was seen in the ED. Pt had an ultrasound performed that showed a blood clot in the legs. He states that he was unable to walk yesterday and states "my leg gave out on me," which is why he returned to the ED. He reports associated weakness and worsening pain with palpation. Pt has been taking blood thinners for about a year. He denies chest pain, SOB, or numbness.   Past Medical History  Diagnosis Date  . Varicose veins   . New onset atrial fibrillation ([place]) 01/02/2015  . Morbid obesity ([place]) 01/02/2015  . Hypertension   . Sleep apnea     been tested but has not received the CPAP yet  . GERD (gastroesophageal reflux disease)   . History of gout   . Cancer ([place])     colon  . Family history of colon cancer   . Family history of breast cancer in mother    Past Surgical History  Procedure Laterality Date  . Vein ligation and stripping      left leg  . Colonoscopy N/A 03/28/2015    SLF: 1. Abdominal pain, diarrhea, rectal bleeding due to obstructing colon mass  . Esophagogastroduodenoscopy N/A 03/28/2015    SLF: 1. dysphagia 2. Mild non-erosive gastritis.   . Esophageal dilation N/A 03/28/2015    Procedure: ESOPHAGEAL DILATION;  Surgeon: [name], MD;  [facility];  Service: Endoscopy;  Laterality: N/A;  . Partial colectomy N/A 04/03/2015    Procedure: PARTIAL COLECTOMY;  Surgeon: [name], MD;  Location: [facility];  Service: General;  Laterality: N/A;  . Portacath placement N/A 05/10/2015    Procedure: INSERTION PORT-A-CATH;  Surgeon: [name], MD;  Location: [facility];  Service: General;  Laterality: N/A;  left subclavian  . Skin cancer destruction Left 08/16/15    left side of nose and left back  . Colonoscopy N/A 10/03/2015    Procedure: COLONOSCOPY;  Surgeon: [name], MD;  [facility];  Service: Endoscopy;  Laterality: N/A;  . Colonoscopy N/A 10/02/2015    Procedure: COLONOSCOPY;  Surgeon: [name], MD;  [facility];  Service: Endoscopy;  Laterality: N/A;  1315   Family History  Problem Relation Age of Onset  . Breast cancer Mother     dx under 63  . Colon cancer Mother     dx in her mid 50s  . Heart disease Father   . Hyperlipidemia Father   . Liver cancer Father     heavy ETOH user when young  . Cancer Sister     slow "blood" cancer  . Diabetes Sister   . Diabetes Brother   . Cancer Maternal Aunt     2-3 maternal aunts with Cancer  NOS  . Lung cancer Paternal Uncle     three uncles with lung cancer - all smokers  . Cancer Paternal Grandmother     possible bone cancer   Social History  Substance Use Topics  . Smoking status: Never Smoker   . Smokeless tobacco: Never Used  . Alcohol Use: No    Review of Systems  Respiratory: Negative for shortness of breath.   Cardiovascular: Positive for leg swelling. Negative for chest pain.  Neurological: Positive for weakness. Negative for numbness.  All other systems reviewed and are negative.  Allergies  Review of patient's allergies indicates no known allergies.  Home Medications   Prior to Admission medications   Medication Sig Start Date End Date Taking? Authorizing Provider  ciprofloxacin (CILOXAN) 0.3 % ophthalmic solution Place 1  drop into the left eye every 4 (four) hours while awake.  08/22/15  Yes Historical Provider, MD  ELIQUIS 5 MG TABS tablet TAKE 1 TABLET BY MOUTH TWICE A DAY 08/16/15  Yes Historical Provider, MD  escitalopram (LEXAPRO) 20 MG tablet Take 1 tablet (20 mg total) by mouth daily. 07/17/15  Yes [name], PA-C  furosemide (LASIX) 20 MG tablet Take 1 tablet (20 mg total) by mouth daily. 10/13/15  Yes [name], MD  Linaclotide [facility]) 145 MCG CAPS capsule 1 PO 30 mins prior to your first meal Patient taking differently: Take 145 mcg by mouth daily. 1 PO 30 mins prior to your first meal 10/03/15  Yes [name], MD  lisinopril (PRINIVIL,ZESTRIL) 10 MG tablet Take 1 tablet (10 mg total) by mouth daily. 01/04/15  Yes [name], MD  omeprazole (PRILOSEC) 20 MG capsule Take 1 capsule (20 mg total) by mouth daily. 03/15/15  Yes [name], PA-C  potassium chloride SA (K-DUR,KLOR-CON) 20 MEQ tablet Take 1 tablet (20 mEq total) by mouth daily. 10/13/15  Yes [name], MD  psyllium (METAMUCIL) 58.6 % packet Take 1 packet by mouth daily.   Yes Historical Provider, MD  lidocaine-prilocaine (EMLA) cream Apply a quarter size amount to port site 1 hour prior to chemo. Do not rub in. Cover with plastic wrap. 04/28/15   [name], MD  nitroGLYCERIN (NITROGLYN) 2 % ointment Apply 0.5 inches topically as needed for chest pain. For rectal area    Historical Provider, MD   BP 136/76 mmHg  Pulse 69  Temp(Src) 98 F (36.7 C) (Oral)  Resp 21  Ht 5\' 7"  (1.702 m)  Wt 268 lb (121.564 kg)  BMI 41.96 kg/m2  SpO2 98%  Physical Exam  Constitutional: He is oriented to person, place, and time. He appears well-developed and well-nourished.  HENT:  Head: Normocephalic and atraumatic.  Eyes: EOM are normal. Pupils are equal, round, and reactive to light.  Neck: Normal range of motion.  Cardiovascular: Normal rate, regular rhythm and normal heart sounds.  Exam reveals no gallop and no  friction rub.   No murmur heard. Pulmonary/Chest: Effort normal and breath sounds normal. He has no wheezes.  Abdominal: Soft. There is no tenderness.  Musculoskeletal: Normal range of motion.  Moderate pitting edema to both lower extremities; decent strength in bilateral lower extremities  Neurological: He is alert and oriented to person, place, and time.  Skin: Skin is warm and dry.  Psychiatric: He has a normal mood and affect. His behavior is normal.    ED Course  Procedures (including critical care time) DIAGNOSTIC STUDIES: Oxygen Saturation is 98% on RA, normal by my  interpretation.    COORDINATION OF CARE: 10:57 AM-Discussed treatment plan which includes labs with pt at bedside and pt agreed to plan.    Labs Review Labs Reviewed - No data to display  Imaging Review US Venous Img Lower Bilateral  10/15/2015  CLINICAL DATA:  69 year old male with bilateral lower extremity edema. Elevated D-dimer. History of congestive heart failure and morbid obesity. EXAM: BILATERAL LOWER EXTREMITY VENOUS DOPPLER ULTRASOUND TECHNIQUE: Gray-scale sonography with graded compression, as well as color Doppler and duplex ultrasound were performed to evaluate the lower extremity deep venous systems from the level of the common femoral vein and including the common femoral, femoral, profunda femoral, popliteal and calf veins including the posterior tibial, peroneal and gastrocnemius veins when visible. The superficial great saphenous vein was also interrogated. Spectral Doppler was utilized to evaluate flow at rest and with distal augmentation maneuvers in the common femoral, femoral and popliteal veins. COMPARISON:  None. FINDINGS: RIGHT LOWER EXTREMITY Common Femoral Vein: No evidence of thrombus. Normal compressibility, respiratory phasicity and response to augmentation. Saphenofemoral Junction: No evidence of thrombus. Normal compressibility and flow on color Doppler imaging. Profunda Femoral Vein: No  evidence of thrombus. Normal compressibility and flow on color Doppler imaging. Femoral Vein: Positive for thrombus (nonocclusive). Partial compressibility and response to augmentation. Popliteal Vein: No evidence of thrombus. Normal compressibility, respiratory phasicity and response to augmentation. Calf Veins: Sub optimal evaluation. Superficial Great Saphenous Vein: No evidence of thrombus. Normal compressibility and flow on color Doppler imaging. Venous Reflux:  None. Other Findings:  None. LEFT LOWER EXTREMITY Common Femoral Vein: No evidence of thrombus. Normal compressibility, respiratory phasicity and response to augmentation. Saphenofemoral Junction: No evidence of thrombus. Normal compressibility and flow on color Doppler imaging. Profunda Femoral Vein: No evidence of thrombus. Normal compressibility and flow on color Doppler imaging. Femoral Vein: No evidence of thrombus. Normal compressibility, respiratory phasicity and response to augmentation. Popliteal Vein: No evidence of thrombus. Normal compressibility, respiratory phasicity and response to augmentation. Calf Veins: Sub optimal evaluation. Superficial Great Saphenous Vein: No evidence of thrombus. Normal compressibility and flow on color Doppler imaging. Venous Reflux:  None. Other Findings:  None. IMPRESSION: 1. Study is positive for nonocclusive deep venous thrombosis in the right femoral vein. 2. No evidence of deep venous thrombosis in the left lower extremity. 3. Limited study which was unable to accurately evaluate the calf veins bilaterally. Electronically Signed   By: Vinnie Langton M.D.   On: 10/15/2015 10:17   Dg Chest Port 1 View  10/15/2015  CLINICAL DATA:  Atrial fibrillation. Morbid obesity. Right lower extremity DVT. EXAM: PORTABLE CHEST 1 VIEW COMPARISON:  05/10/2015 FINDINGS: Left-sided power port remains in appropriate position. Heart size is stable. Both lungs are clear. No evidence of pneumothorax or pleural effusion.  IMPRESSION: No active disease. Electronically Signed   By: Earle Gell M.D.   On: 10/15/2015 11:58   I have personally reviewed and evaluated these images and lab results as part of my medical decision-making.   EKG Interpretation None      MDM   Final diagnoses:  DVT (deep venous thrombosis), right  Peripheral edema    Patient with swelling in both his legs. Has DVT. Is on anticoagulation already but has had some time off it recently due to colonoscopy. Also with edema. Has had generalized fatigue and weakness in his legs. Doubt this is all caused by the DVT. Will admit to internal medicine. I, Davonna Belling R., personally performed the services described in this documentation. All  medical record entries made by the scribe were at my direction and in my presence.  I have reviewed the chart and discharge instructions and agree that the record reflects my personal performance and is accurate and complete. Jaremy Nosal R..  10/15/2015. 1:09 PM.      Davonna Belling, MD 10/15/15 1309

## 2015-10-15 NOTE — ED Notes (Signed)
Patient arrives s/p [place] this morning. + right femoral vein DVT. Patient states new onset of leg weakness and falls last night.

## 2015-10-15 NOTE — H&P (Signed)
History and Physical  [name] YQI:[id number] DOB: July 15, 1946 DOA: 10/15/2015  Referring physician: Davonna Belling, MD. PCP: [name], MD   Chief Complaint: DVT  HPI:   69 yom with a hx of atrial fibrillation, colon cancer (last chemotherapy 6 weeks ago), HTN, GERD, and gout presented with increased leg swelling and redness. In the ED on 10/15 for similar and had a workup consistent with CHF exacerbation. However he had a positive d-dimer so he was referred for an outpatient U/S today. Workup revealed RLE DVT, generalized weakness, and volume overload. Referred for observation for diuresis and further evaluation.   PMH colon cancer followed closely by Dr. Whitney Muse. Recently underwent two colonoscopies (first complicated by poor prep) for which Eliquis was held and resumed 10/7. Because of increasing LE edema and weight gain, he was referred to cardiology and seen by Dr. Harl Bowie. Per echo he has normal LVEF, cannot comment on diastolic function given atrial fibrillation. He was started on low dose Lasix. Despite this, he has continued weight gain and LE edema, resulting in generalized weakness and a fall last night. Patient reports his knees gave out while ambulating secondary to his weakness.   Per wife, he has had multiple falls in the last 3-4 weeks because of his balance being off and his knees giving out. She also reports he has had diarrhea recently since starting Linzess.   In the emergency department VSS, afebrile, not hypoxic Pertinent labs: BMP unremarkable, LFTs without significant change, Tbili improved, Platelet 83, BNP 432, WBC 3.4, Hgb 11.3, D-dimer 1.14 Imaging:  Bilateral Venous US -  Study is positive for nonocclusive deep venous thrombosis in the right femoral vein. No evidence of deep venous thrombosis in the left lower extremity.  Limited study which was unable to accurately evaluate the calf veins bilaterally. CXR independently reviewed- no acute disease.    Review of Systems:  Positive for generalized weakness, weight gain, and LE edema.  Negative for fever, visual changes, sore throat, rash, new muscle aches, chest pain, SOB, dysuria, bleeding, n/v/abdominal pain.  Past Medical History  Diagnosis Date  . Varicose veins   . New onset atrial fibrillation ([place]) 01/02/2015  . Morbid obesity ([place]) 01/02/2015  . Hypertension   . Sleep apnea     been tested but has not received the CPAP yet  . GERD (gastroesophageal reflux disease)   . History of gout   . Cancer ([place])     colon  . Family history of colon cancer   . Family history of breast cancer in mother     Past Surgical History  Procedure Laterality Date  . Vein ligation and stripping      left leg  . Colonoscopy N/A 03/28/2015    SLF: 1. Abdominal pain, diarrhea, rectal bleeding due to obstructing colon mass  . Esophagogastroduodenoscopy N/A 03/28/2015    SLF: 1. dysphagia 2. Mild non-erosive gastritis.   . Esophageal dilation N/A 03/28/2015    Procedure: ESOPHAGEAL DILATION;  Surgeon: [name], MD;  [facility];  Service: Endoscopy;  Laterality: N/A;  . Partial colectomy N/A 04/03/2015    Procedure: PARTIAL COLECTOMY;  Surgeon: [name], MD;  Location: [facility];  Service: General;  Laterality: N/A;  . Portacath placement N/A 05/10/2015    Procedure: INSERTION PORT-A-CATH;  Surgeon: [name], MD;  Location: [facility];  Service: General;  Laterality: N/A;  left subclavian  . Skin cancer destruction Left 08/16/15    left side of nose  and left back  . Colonoscopy N/A 10/03/2015    Procedure: COLONOSCOPY;  Surgeon: [name], MD;  [facility];  Service: Endoscopy;  Laterality: N/A;  . Colonoscopy N/A 10/02/2015    Procedure: COLONOSCOPY;  Surgeon: [name], MD;  [facility];  Service: Endoscopy;  Laterality: N/A;  1315    Social History:  reports that he has never smoked. He has never used smokeless tobacco. He reports that he  does not drink alcohol or use illicit drugs. lives with their spouse Self-care  No Known Allergies  Family History  Problem Relation Age of Onset  . Breast cancer Mother     dx under 54  . Colon cancer Mother     dx in her mid 64s  . Heart disease Father   . Hyperlipidemia Father   . Liver cancer Father     heavy ETOH user when young  . Cancer Sister     slow "blood" cancer  . Diabetes Sister   . Diabetes Brother   . Cancer Maternal Aunt     2-3 maternal aunts with Cancer NOS  . Lung cancer Paternal Uncle     three uncles with lung cancer - all smokers  . Cancer Paternal Grandmother     possible bone cancer     Prior to Admission medications   Medication Sig Start Date End Date Taking? Authorizing Provider  ciprofloxacin (CILOXAN) 0.3 % ophthalmic solution Place 1 drop into the left eye every 4 (four) hours while awake.  08/22/15   Historical Provider, MD  ELIQUIS 5 MG TABS tablet TK 1 T PO BID 08/16/15   Historical Provider, MD  escitalopram (LEXAPRO) 20 MG tablet Take 1 tablet (20 mg total) by mouth daily. 07/17/15   Baird Cancer, PA-C  furosemide (LASIX) 20 MG tablet Take 1 tablet (20 mg total) by mouth daily. 10/13/15   [name], MD  lidocaine-prilocaine (EMLA) cream Apply a quarter size amount to port site 1 hour prior to chemo. Do not rub in. Cover with plastic wrap. 04/28/15   [name], MD  Linaclotide [facility]) 145 MCG CAPS capsule 1 PO 30 mins prior to your first meal 10/03/15   [name], MD  lisinopril (PRINIVIL,ZESTRIL) 10 MG tablet Take 1 tablet (10 mg total) by mouth daily. 01/04/15   [name], MD  nitroGLYCERIN (NITROGLYN) 2 % ointment Apply 0.5 inches topically as needed for chest pain. For rectal area    Historical Provider, MD  omeprazole (PRILOSEC) 20 MG capsule Take 1 capsule (20 mg total) by mouth daily. 03/15/15   [name], PA-C  potassium chloride SA (K-DUR,KLOR-CON) 20 MEQ tablet Take 1 tablet (20 mEq total) by mouth  daily. 10/13/15   [name], MD  psyllium (METAMUCIL) 58.6 % packet Take 1 packet by mouth daily.    Historical Provider, MD   Physical Exam: Filed Vitals:   10/15/15 1048 10/15/15 1101 10/15/15 1130 10/15/15 1145  BP: 136/76 122/69 114/58   Pulse: 69 70 79   Temp: 98 F (36.7 C)     TempSrc: Oral     Resp: 21     Height: 5\' 7"  (1.702 m)     Weight: 121.564 kg (268 lb)     SpO2: 98% 99%  97%    VSS, afebrile, not hypoxic General:  Appears calm and comfortable Eyes: PERRL, normal lids, irises & conjunctiva ENT: grossly normal hearing, lips & tongue Neck: no LAD,  masses or thyromegaly Cardiovascular: RRR, no m/r/g. 4+ BLE edema Respiratory: CTA bilaterally, no w/r/r. Normal respiratory effort. Abdomen: soft, ntnd Skin: no rash or induration seen   Musculoskeletal: grossly normal tone BUE/BLE Psychiatric: grossly normal mood and affect, speech fluent and appropriate Neurologic: grossly non-focal.  Wt Readings from Last 3 Encounters:  10/15/15 121.564 kg (268 lb)  10/14/15 123.832 kg (273 lb)  10/13/15 124.195 kg (273 lb 12.8 oz)    Labs on Admission:  Basic Metabolic Panel:  Recent Labs Lab 10/14/15 1334  NA 136  K 4.6  CL 107  CO2 27  GLUCOSE 102*  BUN 7  CREATININE 0.75  CALCIUM 8.7*    Liver Function Tests:  Recent Labs Lab 10/14/15 1334  AST 71*  ALT 36  ALKPHOS 133*  BILITOT 2.1*  PROT 6.1*  ALBUMIN 2.2*    CBC:  Recent Labs Lab 10/14/15 1334  WBC 3.4*  NEUTROABS 1.8  HGB 11.3*  HCT 33.5*  MCV 103.1*  PLT 83*     Radiological Exams on Admission: US Venous Img Lower Bilateral  10/15/2015  CLINICAL DATA:  69 year old male with bilateral lower extremity edema. Elevated D-dimer. History of congestive heart failure and morbid obesity. EXAM: BILATERAL LOWER EXTREMITY VENOUS DOPPLER ULTRASOUND TECHNIQUE: Gray-scale sonography with graded compression, as well as color Doppler and duplex ultrasound were performed to evaluate the lower  extremity deep venous systems from the level of the common femoral vein and including the common femoral, femoral, profunda femoral, popliteal and calf veins including the posterior tibial, peroneal and gastrocnemius veins when visible. The superficial great saphenous vein was also interrogated. Spectral Doppler was utilized to evaluate flow at rest and with distal augmentation maneuvers in the common femoral, femoral and popliteal veins. COMPARISON:  None. FINDINGS: RIGHT LOWER EXTREMITY Common Femoral Vein: No evidence of thrombus. Normal compressibility, respiratory phasicity and response to augmentation. Saphenofemoral Junction: No evidence of thrombus. Normal compressibility and flow on color Doppler imaging. Profunda Femoral Vein: No evidence of thrombus. Normal compressibility and flow on color Doppler imaging. Femoral Vein: Positive for thrombus (nonocclusive). Partial compressibility and response to augmentation. Popliteal Vein: No evidence of thrombus. Normal compressibility, respiratory phasicity and response to augmentation. Calf Veins: Sub optimal evaluation. Superficial Great Saphenous Vein: No evidence of thrombus. Normal compressibility and flow on color Doppler imaging. Venous Reflux:  None. Other Findings:  None. LEFT LOWER EXTREMITY Common Femoral Vein: No evidence of thrombus. Normal compressibility, respiratory phasicity and response to augmentation. Saphenofemoral Junction: No evidence of thrombus. Normal compressibility and flow on color Doppler imaging. Profunda Femoral Vein: No evidence of thrombus. Normal compressibility and flow on color Doppler imaging. Femoral Vein: No evidence of thrombus. Normal compressibility, respiratory phasicity and response to augmentation. Popliteal Vein: No evidence of thrombus. Normal compressibility, respiratory phasicity and response to augmentation. Calf Veins: Sub optimal evaluation. Superficial Great Saphenous Vein: No evidence of thrombus. Normal  compressibility and flow on color Doppler imaging. Venous Reflux:  None. Other Findings:  None. IMPRESSION: 1. Study is positive for nonocclusive deep venous thrombosis in the right femoral vein. 2. No evidence of deep venous thrombosis in the left lower extremity. 3. Limited study which was unable to accurately evaluate the calf veins bilaterally. Electronically Signed   By: Vinnie Langton M.D.   On: 10/15/2015 10:17   Dg Chest Port 1 View  10/15/2015  CLINICAL DATA:  Atrial fibrillation. Morbid obesity. Right lower extremity DVT. EXAM: PORTABLE CHEST 1 VIEW COMPARISON:  05/10/2015 FINDINGS: Left-sided power port remains in  appropriate position. Heart size is stable. Both lungs are clear. No evidence of pneumothorax or pleural effusion. IMPRESSION: No active disease. Electronically Signed   By: Earle Gell M.D.   On: 10/15/2015 11:58     Principal Problem:   Acute diastolic CHF (congestive heart failure) (HCC) Active Problems:   Morbid obesity (HCC)   Colon carcinoma ([place])   Peripheral edema   Generalized weakness   DVT (deep venous thrombosis) (HCC)   Atrial fibrillation (HCC)   Assessment/Plan 1. Bilateral LE edema, significant weight gain 2. Suspected acute diastolic heart failure 3. Generalized weakness, likely secondary to volume overload 4. Acute right LE DVT. Would not consider this treatment failure as the patient had Eliquis therapy interupted for 2 colonoscopies. No hypoxia or tachypnea or hx to suggest PE.  5. Colon cancer 6. Atrial fibrillation, stable.  7. OSA, on CPAP at night.  8. Morbid obesity   Obs to medical floor for IV diuresis  PT consultation  Continue Eliquis  Will let Dr. Whitney Muse know that he is here.  Code Status: Full  DVT prophylaxis:Eliquis Family Communication: Wife at bedside. Discussed with patient who understands and has no concerns at this time. Disposition Plan/Anticipated LOS: Obs to medical bed.   Time spent: 50 minutes  Murray Hodgkins, MD  [facility] Pager [phone] 10/15/2015, 12:03 PM   By signing my name below, I, Rosalie Doctor attest that this documentation has been prepared under the direction and in the presence of Murray Hodgkins, MD Electronically signed: Rosalie Doctor, Scribe.  10/15/2015 12:11pm   I personally performed the services described in this documentation. All medical record entries made by the scribe were at my direction. I have reviewed the chart and agree that the record reflects my personal performance and is accurate and complete. Murray Hodgkins, MD

## 2015-10-16 ENCOUNTER — Encounter (HOSPITAL_COMMUNITY): Payer: Self-pay | Admitting: Hematology & Oncology

## 2015-10-16 ENCOUNTER — Observation Stay (HOSPITAL_COMMUNITY): Payer: Medicare Other

## 2015-10-16 DIAGNOSIS — Z9049 Acquired absence of other specified parts of digestive tract: Secondary | ICD-10-CM | POA: Diagnosis not present

## 2015-10-16 DIAGNOSIS — R296 Repeated falls: Secondary | ICD-10-CM | POA: Diagnosis present

## 2015-10-16 DIAGNOSIS — R1314 Dysphagia, pharyngoesophageal phase: Secondary | ICD-10-CM | POA: Diagnosis present

## 2015-10-16 DIAGNOSIS — I110 Hypertensive heart disease with heart failure: Secondary | ICD-10-CM | POA: Diagnosis present

## 2015-10-16 DIAGNOSIS — R159 Full incontinence of feces: Secondary | ICD-10-CM | POA: Diagnosis not present

## 2015-10-16 DIAGNOSIS — R197 Diarrhea, unspecified: Secondary | ICD-10-CM | POA: Diagnosis not present

## 2015-10-16 DIAGNOSIS — I82409 Acute embolism and thrombosis of unspecified deep veins of unspecified lower extremity: Secondary | ICD-10-CM | POA: Diagnosis not present

## 2015-10-16 DIAGNOSIS — R4182 Altered mental status, unspecified: Secondary | ICD-10-CM | POA: Diagnosis not present

## 2015-10-16 DIAGNOSIS — I82A19 Acute embolism and thrombosis of unspecified axillary vein: Secondary | ICD-10-CM | POA: Diagnosis not present

## 2015-10-16 DIAGNOSIS — G40909 Epilepsy, unspecified, not intractable, without status epilepticus: Secondary | ICD-10-CM | POA: Diagnosis present

## 2015-10-16 DIAGNOSIS — C189 Malignant neoplasm of colon, unspecified: Secondary | ICD-10-CM | POA: Diagnosis not present

## 2015-10-16 DIAGNOSIS — Z8249 Family history of ischemic heart disease and other diseases of the circulatory system: Secondary | ICD-10-CM | POA: Diagnosis not present

## 2015-10-16 DIAGNOSIS — K219 Gastro-esophageal reflux disease without esophagitis: Secondary | ICD-10-CM | POA: Diagnosis present

## 2015-10-16 DIAGNOSIS — Z800 Family history of malignant neoplasm of digestive organs: Secondary | ICD-10-CM | POA: Diagnosis not present

## 2015-10-16 DIAGNOSIS — R7989 Other specified abnormal findings of blood chemistry: Secondary | ICD-10-CM | POA: Diagnosis not present

## 2015-10-16 DIAGNOSIS — Z7901 Long term (current) use of anticoagulants: Secondary | ICD-10-CM | POA: Diagnosis not present

## 2015-10-16 DIAGNOSIS — Z6841 Body Mass Index (BMI) 40.0 and over, adult: Secondary | ICD-10-CM | POA: Diagnosis not present

## 2015-10-16 DIAGNOSIS — Z9221 Personal history of antineoplastic chemotherapy: Secondary | ICD-10-CM | POA: Diagnosis not present

## 2015-10-16 DIAGNOSIS — M4712 Other spondylosis with myelopathy, cervical region: Secondary | ICD-10-CM | POA: Diagnosis present

## 2015-10-16 DIAGNOSIS — I82411 Acute embolism and thrombosis of right femoral vein: Secondary | ICD-10-CM | POA: Diagnosis present

## 2015-10-16 DIAGNOSIS — G4733 Obstructive sleep apnea (adult) (pediatric): Secondary | ICD-10-CM | POA: Diagnosis present

## 2015-10-16 DIAGNOSIS — F4321 Adjustment disorder with depressed mood: Secondary | ICD-10-CM | POA: Diagnosis present

## 2015-10-16 DIAGNOSIS — F419 Anxiety disorder, unspecified: Secondary | ICD-10-CM | POA: Diagnosis present

## 2015-10-16 DIAGNOSIS — Z801 Family history of malignant neoplasm of trachea, bronchus and lung: Secondary | ICD-10-CM | POA: Diagnosis not present

## 2015-10-16 DIAGNOSIS — I82402 Acute embolism and thrombosis of unspecified deep veins of left lower extremity: Secondary | ICD-10-CM | POA: Diagnosis not present

## 2015-10-16 DIAGNOSIS — I4891 Unspecified atrial fibrillation: Secondary | ICD-10-CM | POA: Diagnosis not present

## 2015-10-16 DIAGNOSIS — C19 Malignant neoplasm of rectosigmoid junction: Secondary | ICD-10-CM | POA: Diagnosis present

## 2015-10-16 DIAGNOSIS — I481 Persistent atrial fibrillation: Secondary | ICD-10-CM

## 2015-10-16 DIAGNOSIS — I5033 Acute on chronic diastolic (congestive) heart failure: Secondary | ICD-10-CM | POA: Diagnosis present

## 2015-10-16 DIAGNOSIS — Z833 Family history of diabetes mellitus: Secondary | ICD-10-CM | POA: Diagnosis not present

## 2015-10-16 DIAGNOSIS — R609 Edema, unspecified: Secondary | ICD-10-CM | POA: Diagnosis present

## 2015-10-16 DIAGNOSIS — I5031 Acute diastolic (congestive) heart failure: Secondary | ICD-10-CM | POA: Diagnosis not present

## 2015-10-16 DIAGNOSIS — I482 Chronic atrial fibrillation: Secondary | ICD-10-CM | POA: Diagnosis present

## 2015-10-16 DIAGNOSIS — C4490 Unspecified malignant neoplasm of skin, unspecified: Secondary | ICD-10-CM | POA: Diagnosis present

## 2015-10-16 LAB — BASIC METABOLIC PANEL
Anion gap: 8 (ref 5–15)
BUN: 7 mg/dL (ref 6–20)
CALCIUM: 8.3 mg/dL — AB (ref 8.9–10.3)
CHLORIDE: 100 mmol/L — AB (ref 101–111)
CO2: 28 mmol/L (ref 22–32)
CREATININE: 0.81 mg/dL (ref 0.61–1.24)
GFR calc non Af Amer: >60 mL/min (ref >60)
Glucose, Bld: 113 mg/dL — ABNORMAL HIGH (ref 65–99)
Potassium: 3.5 mmol/L (ref 3.5–5.1)
SODIUM: 136 mmol/L (ref 135–145)

## 2015-10-16 MED ORDER — POTASSIUM CHLORIDE CRYS ER 20 MEQ PO TBCR
40.0000 meq | EXTENDED_RELEASE_TABLET | Freq: Every day | ORAL | Status: DC
  Administered 2015-10-17 – 2015-10-24 (×6): 40 meq via ORAL
  Filled 2015-10-16 (×6): qty 2

## 2015-10-16 NOTE — Plan of Care (Signed)
Problem: Acute Rehab PT Goals(only PT should resolve) Goal: Patient Will Transfer Sit To/From Stand Pt will transfer 5x sit to/from-stand with RW  In less than 20 seconds at modified independent without loss-of-balance to demonstrate good safety awareness for independent mobility in home.     Goal: Pt Will Ambulate Pt will ambulate with RW at Supervision using a step-through pattern and equal step length for a distances greater than 222ft to demonstrate the ability to perform safe household distance ambulation at discharge.

## 2015-10-16 NOTE — Consult Note (Signed)
CARDIOLOGY CONSULT NOTE   [name] MRN: [id number] DOB/AGE: 01/28/1946 69 y.o.  Admit Date: 10/15/2015 Referring Physician:Hawkins, Percell Miller MD Primary Physician: [name], MD Consulting Cardiologist: Jenkins Rouge MD Primary Cardiologist: Carlyle Dolly MD Reason for Consultation: CHF  Clinical Summary [name] is a 69 y.o.male admitted with decompensated CHF with weight gain of 20 lbs, and right leg DVT, known history of atrial fib CHADS VASC Score of 2 on Eliquis, hypertension, OSA, GERD, and morbid obesity.      States symptoms began a week ago. Saw Dr. Harl Bowie on 10/13/2015 for these symptoms. Was started on lasix 20 mg daily along with potassium 20 mEq daily. Labs were ordered. He states that he took it for one day, but the right leg began to have significant swelling. He came to ER on 10/14/2015. Lasix was increased and he was planned for doppler ultrasound the following morning. He states that the increased dose of lasix did not improve the swelling. He was unable to walk due to knees that became weak. Came back to ER on 10/15/2015 for ultrasound that confirmed  Non-occlusive DVT of the right femoral vein.   The patient had not been taking Eliquis for two weeks. He states he had a colonoscopy on 10/03/2015 but was unable to complete as he was not adequately clear of stool This was rescheduled and he was asked to stay off of Eliquis an additional week. He was negative for PE.  He has since been started on IV lasix 40 mg BID, and placed back on Eliquis. He had diuresed 5 liters and is feeling some better. Edema has improved. He still feels weak and has not been up out of bed. He denies eating salty foods or medical non-adherence other than Elqiuis which he was asked to stop. He uses CPAP as directed.   No Known Allergies  Medications Scheduled Medications: . apixaban  10 mg Oral BID  . ciprofloxacin  1 drop Left Eye Q4H while awake  .  escitalopram  20 mg Oral Daily  . furosemide  40 mg Intravenous Q12H  . Linaclotide  145 mcg Oral Daily  . lisinopril  10 mg Oral Daily  . pantoprazole  40 mg Oral Daily  . potassium chloride SA  20 mEq Oral Daily  . psyllium  1 packet Oral Daily  . sodium chloride  3 mL Intravenous Q12H    Infusions:    PRN Medications: sodium chloride, acetaminophen, ondansetron (ZOFRAN) IV, sodium chloride   Past Medical History  Diagnosis Date  . Varicose veins   . New onset atrial fibrillation ([place]) 01/02/2015  . Morbid obesity ([place]) 01/02/2015  . Hypertension   . Sleep apnea     been tested but has not received the CPAP yet  . GERD (gastroesophageal reflux disease)   . History of gout   . Cancer ([place])     colon  . Family history of colon cancer   . Family history of breast cancer in mother     Past Surgical History  Procedure Laterality Date  . Vein ligation and stripping      left leg  . Colonoscopy N/A 03/28/2015    SLF: 1. Abdominal pain, diarrhea, rectal bleeding due to obstructing colon mass  . Esophagogastroduodenoscopy N/A 03/28/2015    SLF: 1. dysphagia 2. Mild non-erosive gastritis.   . Esophageal dilation N/A 03/28/2015    Procedure: ESOPHAGEAL DILATION;  Surgeon: [name], MD;  Location: [facility];  Service: Endoscopy;  Laterality: N/A;  . Partial colectomy N/A 04/03/2015    Procedure: PARTIAL COLECTOMY;  Surgeon: [name], MD;  Location: [facility];  Service: General;  Laterality: N/A;  . Portacath placement N/A 05/10/2015    Procedure: INSERTION PORT-A-CATH;  Surgeon: [name], MD;  Location: [facility];  Service: General;  Laterality: N/A;  left subclavian  . Skin cancer destruction Left 08/16/15    left side of nose and left back  . Colonoscopy N/A 10/03/2015    Procedure: COLONOSCOPY;  Surgeon: [name], MD;  Location: [facility];  Service: Endoscopy;  Laterality: N/A;  . Colonoscopy N/A 10/02/2015    Procedure: COLONOSCOPY;  Surgeon: [name], MD;  Location: [facility];  Service: Endoscopy;  Laterality: N/A;  1315    Family History  Problem Relation Age of Onset  . Breast cancer Mother     dx under 22  . Colon cancer Mother     dx in her mid 41s  . Heart disease Father   . Hyperlipidemia Father   . Liver cancer Father     heavy ETOH user when young  . Cancer Sister     slow "blood" cancer  . Diabetes Sister   . Diabetes Brother   . Cancer Maternal Aunt     2-3 maternal aunts with Cancer NOS  . Lung cancer Paternal Uncle     three uncles with lung cancer - all smokers  . Cancer Paternal Grandmother     possible bone cancer    Social History Mr. Heier reports that he has never smoked. He has never used smokeless tobacco. Mr. Hodkinson reports that he does not drink alcohol.  Review of Systems Complete review of systems are found to be negative unless outlined in H&P above.  Physical Examination Blood pressure 107/64, pulse 89, temperature 97.8 F (36.6 C), temperature source Oral, resp. rate 18, height 5\' 7"  (1.702 m), weight 256 lb 4.8 oz (116.257 kg), SpO2 97 %.  Intake/Output Summary (Last 24 hours) at 10/16/15 0954 Last data filed at 10/16/15 0529  Gross per 24 hour  Intake    243 ml  Output   4900 ml  Net  -4657 ml    Telemetry: Atrial fib 80's.  GEN:No acute distress  HEENT: Conjunctiva and lids normal, oropharynx clear with moist mucosa. Neck: Supple, no elevated JVP or carotid bruits, no thyromegaly. Lungs: Clear to auscultation, nonlabored breathing at rest. Cardiac: Irregular rate and rhythm, 1/6 systolic murmur, no S3 or significant systolic murmur, no pericardial rub. Abdomen: Soft, nontender, no hepatomegaly, bowel sounds present, no guarding or rebound. Extremities: No pitting edema, distal pulses 2+. Skin: Warm and dry. Musculoskeletal: No kyphosis. Neuropsychiatric: Alert and oriented x3, affect grossly appropriate.  NM Study 01/11/2015  1. No reversible ischemia or  infarction. 2. Normal left ventricular wall motion. 3. Left ventricular ejection fraction 58% 4. Low-risk stress test findings  Prior Cardiac Testing/Procedures 1. Echocardiogram 09/18/2015 Left ventricle: Systolic function was normal. The estimated ejection fraction was in the range of 60% to 65%. - Mitral valve: There was mild regurgitation. - Left atrium: The atrium was moderately dilated. - Pulmonary arteries: PA peak pressure: 33 mm Hg (S).  Lab Results  Basic Metabolic Panel:  Recent Labs Lab 10/14/15 1334 10/16/15 0640  NA 136 136  K 4.6 3.5  CL 107 100*  CO2 27 28  GLUCOSE 102* 113*  BUN 7 7  CREATININE 0.75 0.81  CALCIUM 8.7*  8.3*    Liver Function Tests:  Recent Labs Lab 10/14/15 1334  AST 71*  ALT 36  ALKPHOS 133*  BILITOT 2.1*  PROT 6.1*  ALBUMIN 2.2*    CBC:  Recent Labs Lab 10/14/15 1334  WBC 3.4*  NEUTROABS 1.8  HGB 11.3*  HCT 33.5*  MCV 103.1*  PLT 83*    Radiology: US Venous Img Lower Bilateral  10/15/2015  CLINICAL DATA:  69 year old male with bilateral lower extremity edema. Elevated D-dimer. History of congestive heart failure and morbid obesity. EXAM: BILATERAL LOWER EXTREMITY VENOUS DOPPLER ULTRASOUND TECHNIQUE: Gray-scale sonography with graded compression, as well as color Doppler and duplex ultrasound were performed to evaluate the lower extremity deep venous systems from the level of the common femoral vein and including the common femoral, femoral, profunda femoral, popliteal and calf veins including the posterior tibial, peroneal and gastrocnemius veins when visible. The superficial great saphenous vein was also interrogated. Spectral Doppler was utilized to evaluate flow at rest and with distal augmentation maneuvers in the common femoral, femoral and popliteal veins. COMPARISON:  None. FINDINGS: RIGHT LOWER EXTREMITY Common Femoral Vein: No evidence of thrombus. Normal compressibility, respiratory phasicity and response to  augmentation. Saphenofemoral Junction: No evidence of thrombus. Normal compressibility and flow on color Doppler imaging. Profunda Femoral Vein: No evidence of thrombus. Normal compressibility and flow on color Doppler imaging. Femoral Vein: Positive for thrombus (nonocclusive). Partial compressibility and response to augmentation. Popliteal Vein: No evidence of thrombus. Normal compressibility, respiratory phasicity and response to augmentation. Calf Veins: Sub optimal evaluation. Superficial Great Saphenous Vein: No evidence of thrombus. Normal compressibility and flow on color Doppler imaging. Venous Reflux:  None. Other Findings:  None. LEFT LOWER EXTREMITY Common Femoral Vein: No evidence of thrombus. Normal compressibility, respiratory phasicity and response to augmentation. Saphenofemoral Junction: No evidence of thrombus. Normal compressibility and flow on color Doppler imaging. Profunda Femoral Vein: No evidence of thrombus. Normal compressibility and flow on color Doppler imaging. Femoral Vein: No evidence of thrombus. Normal compressibility, respiratory phasicity and response to augmentation. Popliteal Vein: No evidence of thrombus. Normal compressibility, respiratory phasicity and response to augmentation. Calf Veins: Sub optimal evaluation. Superficial Great Saphenous Vein: No evidence of thrombus. Normal compressibility and flow on color Doppler imaging. Venous Reflux:  None. Other Findings:  None. IMPRESSION: 1. Study is positive for nonocclusive deep venous thrombosis in the right femoral vein. 2. No evidence of deep venous thrombosis in the left lower extremity. 3. Limited study which was unable to accurately evaluate the calf veins bilaterally. Electronically Signed   By: Vinnie Langton M.D.   On: 10/15/2015 10:17   Dg Chest Port 1 View  10/15/2015  CLINICAL DATA:  Atrial fibrillation. Morbid obesity. Right lower extremity DVT. EXAM: PORTABLE CHEST 1 VIEW COMPARISON:  05/10/2015 FINDINGS:  Left-sided power port remains in appropriate position. Heart size is stable. Both lungs are clear. No evidence of pneumothorax or pleural effusion. IMPRESSION: No active disease. Electronically Signed   By: Earle Gell M.D.   On: 10/15/2015 11:58     ECG: Atrial fib with rate of 89 bpm.    Impression and Recommendations  1. Acute diastolic CHF:  He is responding well to IV diuretics with 5 liters output since admission. Creatinine is 0.81, Potassium is low normal on 20 mEq po daily. Will increase to 40 mEq daily. Check Mg. Continue IV diuretics until he reached dry wt of around 250 lbs.   2. Atrial fib: Heart rate is controlled without rate medications. He  is back on Eliquis 10 mg BID and will be placed back on 5 mg BID thereafter. Will need close follow up with cardiology after discharge. .   3. Acute Right Femoral DVT: Remains on anticoagulation. Improvement in symptoms and edema; Would like to have him get up and begin ambulation. May need PT evaluation.   4. Hx of Colon CA: Followed by oncology.    Signed: Phill Myron. Lawrence NP [place]  10/16/2015, 9:54 AM Co-Sign MD  Patient examined chart reviewed. Discussed care plan with patient and wife Primary issue appears to be functional disability from cancer, morbid obesity And acute left femoral DVT.  Agree with diuresis and eliquis at a dose of 10 bid For 7 days and then back to his normal afib dose. Would not consider NOAC a  Failure since it was stopped for colonoscopy.  Discussed lovenox bridging with Patient and wife so this does not happen again.  Exam remarkable from chronically  Ill white male with poor dentition.  Plus 2 bilateral LE edema, foley and left sided Porta cath for chemo.    Jenkins Rouge

## 2015-10-16 NOTE — Evaluation (Signed)
Physical Therapy Evaluation Patient Details Name: [name] MRN: [id number] DOB: [birth year] Today's Date: 10/16/2015   History of Present Illness  Pt is a 69yo white male, morbidly obese, currently undergoing chemotherapy for CA. Pt was admitted with multiple medical problems. He has a DVT in his right leg. He has marked swelling of both legs. He had gained about 20 pounds. In addition to that he reports chronic decline of balance over the past 6 weeks and has lost control of his stool. He is known to have atrial fibrillation and he was anticoagulated on Eliquis but he had been off that so he could have colonoscopy so I don't think his clot is a failure of his anticoagulant. He had echocardiogram about a month ago that showed normal left ventricular systolic function but diastolic function could not be assessed but it is felt that he probably has chronic diastolic heart failure. He says he feels a little better  Clinical Impression  Pt reporting chronic decline in balance over past 6 weeks, with falls every other day. Pt has been using SPC with 1 person Hand held assist, but is more appropriate for RW use at this time. Pt presenting with significant balance limitations evident in tests above, as well as slow gait speed. Pt also demonstrating significant decrease in strength in BLE, which is likely contributing to falls at home. Pt will benefit from skilled intervention to address the above impairments and to improve pt safety in home by reducing risk of falls.     Follow Up Recommendations Home health PT (Pt has a family member who is a PT; they may opt to continue with her. )    Equipment Recommendations  None recommended by PT    Recommendations for Other Services       Precautions / Restrictions Precautions Precautions: Fall Precaution Comments: Pt reports falling every other day and is very unsteady with ambulation.  Restrictions Weight Bearing Restrictions: No       Mobility  Bed Mobility Overal bed mobility: Modified Independent                Transfers Overall transfer level: Needs assistance Equipment used: Rolling walker (2 wheeled) Transfers: Sit to/from Stand Sit to Stand: Min guard         General transfer comment: Pt is quite stable once up with  LOBx3 during static standing balance training.   Ambulation/Gait Ambulation/Gait assistance: Min guard Ambulation Distance (Feet): 150 Feet Assistive device: Rolling walker (2 wheeled)   Gait velocity: 0.52m/s  Gait velocity interpretation: <1.8 ft/sec, indicative of risk for recurrent falls General Gait Details: Compensated trendelenburg on R side during gait, wide base of support, requires VC to maintian RW at safe distance.   Stairs            Wheelchair Mobility    Modified Rankin (Stroke Patients Only)       Balance Overall balance assessment: Needs assistance;History of Falls Sitting-balance support: Feet supported;No upper extremity supported Sitting balance-Leahy Scale: Normal     Standing balance support: No upper extremity supported Standing balance-Leahy Scale: Poor Standing balance comment: Poor forward lean excursion <2 inches; LOB with eyes closed normal stance. LOB with perturbations while standing.                              Pertinent Vitals/Pain Pain Assessment: 0-10 Pain Score: 8  Pain Location: Lower leg near DVT site.  Pain Descriptors /  Indicators: Aching Pain Intervention(s): Limited activity within patient's tolerance;Monitored during session    [place] expects to be discharged to:: Private residence Living Arrangements: Spouse/significant other Available Help at Discharge: Family Type of Home: House Home Access: Stairs to enter Entrance Stairs-Rails: Right Entrance Stairs-Number of Steps: 3, requires assistance from wife to perform. Home Layout: One level        Prior Function Level of  Independence: Needs assistance   Gait / Transfers Assistance Needed: SPC +1 ModA for ambulation to prevent falling.   ADL's / Homemaking Assistance Needed: Requires some assistance.         Hand Dominance   Dominant Hand: Right    Extremity/Trunk Assessment   Upper Extremity Assessment: Overall WFL for tasks assessed (R grip mildly weaker than L. )           Lower Extremity Assessment: Generalized weakness (moderate weakness, requires labored effort to transfer to standing with AD. MMT 5/5 throughout BLE except for R hip flexion: 4/5, L hip flexion 3/5, R knee extension 4/5. )         Communication   Communication: No difficulties  Cognition Arousal/Alertness: Awake/alert Behavior During Therapy: WFL for tasks assessed/performed Overall Cognitive Status: Within Functional Limits for tasks assessed                      General Comments      Exercises        Assessment/Plan    PT Assessment Patient needs continued PT services  PT Diagnosis Difficulty walking;Abnormality of gait;Generalized weakness;Acute pain   PT Problem List Decreased strength;Decreased activity tolerance;Decreased balance;Decreased mobility  PT Treatment Interventions DME instruction;Gait training;Functional mobility training;Therapeutic activities;Therapeutic exercise;Balance training;Patient/family education   PT Goals (Current goals can be found in the Care Plan section) Acute Rehab PT Goals Patient Stated Goal: Return to home, decrease falls, improve indep mobility  PT Goal Formulation: With patient/family Time For Goal Achievement: 10/16/15 Potential to Achieve Goals: Fair    Frequency Min 3X/week   Barriers to discharge        Co-evaluation               End of Session Equipment Utilized During Treatment: Gait belt Activity Tolerance: Patient tolerated treatment well Patient left: in chair;with nursing/sitter in room;with call bell/phone within reach (NA agreeable  to set up alarm for pt. ) Nurse Communication: Mobility status;Other (comment)    Functional Assessment Tool Used: Clinical Judgment  Functional Limitation: Mobility: Walking and moving around Mobility: Walking and Moving Around Current Status [phone]): At least 40 percent but less than 60 percent impaired, limited or restricted Mobility: Walking and Moving Around Goal Status [phone]): At least 20 percent but less than 40 percent impaired, limited or restricted    Time: 1150-1216 PT Time Calculation (min) (ACUTE ONLY): 26 min   Charges:   PT Evaluation $Initial PT Evaluation Tier I: 1 Procedure PT Treatments $Gait Training: 8-22 mins   PT G Codes:   PT G-Codes **NOT FOR INPATIENT CLASS** Functional Assessment Tool Used: Clinical Judgment  Functional Limitation: Mobility: Walking and moving around Mobility: Walking and Moving Around Current Status ([id number]): At least 40 percent but less than 60 percent impaired, limited or restricted Mobility: Walking and Moving Around Goal Status [phone]): At least 20 percent but less than 40 percent impaired, limited or restricted    Justino Boze C 10/16/2015, 1:30 PM  1:32 PM  Etta Grandchild, PT, DPT [place] License # [id number]

## 2015-10-16 NOTE — Consult Note (Signed)
[place] A. [name], MD     [email or web address]          [name] is an 69 y.o. male.   ASSESSMENT/PLAN: 1. Multifactorial gait impairment including osteoarthritis and the radiographic evidence of cord encroachment. No other neurological causes are uncovered. As noted Over the evidence of myelopathy although he does have the MRI findings of the cervical spine. There is no evidence of neuropathy or parkinsonism. 2. Chronic atrial fibrillation on chronic anticoagulation. 3. Congestive heart failure.  RECOMMENDATION: Trial of high-dose steroids for 2-3 days. Solu-Medrol will be started. Additional labs. Physical and occupational therapy.   The patient is 69 year old white male who presents with recurrent falls over the last 6 weeks. History is obtained from the patient and his wife. The wife reports that the patient tends to fall to the right when he falls. He did fall one time last week however when he was sitting on the bathroom. Alteration of consciousness or loss of consciousness is not reported. There is appears to be global weakness but no focal weakness on either side. No dysarthria, dysphasia chest pain or shortness of breath. The patient did have edema of the ankles and legs and was diagnosed as having congestive heart failure during this admission. The patient reports having a mild headache this morning. The review of systems is otherwise negative.  GENERAL: Pleasant obese man in no acute distress.  HEENT: Supple. Atraumatic normocephalic.   ABDOMEN: soft  EXTREMITIES: One plus pitting edema of the distal legs and ankles. Marked arthritic changes of the knees.   BACK: Normal.  SKIN: Normal by inspection.    MENTAL STATUS: Alert and oriented. Speech, language and cognition are generally intact. Judgment and insight normal.   CRANIAL NERVES: Pupils are equal, round and reactive to light and accommodation; extra ocular movements are full, there  is no significant nystagmus; visual fields are full; upper and lower facial muscles are normal in strength and symmetric, there is no flattening of the nasolabial folds; tongue is midline; uvula is midline; shoulder elevation is normal.  MOTOR: Normal tone, bulk and strength; no pronator drift.  COORDINATION: Left finger to nose is normal, right finger to nose is normal, No rest tremor; no intention tremor; no postural tremor; no bradykinesia.  REFLEXES: Deep tendon reflexes are symmetrical and normal. Babinski reflexes are flexor bilaterally.   SENSATION: Normal to light touch.  The cervical spine MRI is reviewed in person. There is disc osteophyte complex at the C4-C5 and also C5-C6 levels. Both areas are associated with significant encroachment on the spinal cord with a marked reduction in spinal fluid at these levels especially at the C5-C6 level. No intrinsic cord lesions are observed.   Blood pressure 108/65, pulse 80, temperature 98.2 F (36.8 C), temperature source Oral, resp. rate 18, height 5' 7"  (1.702 m), weight 116.257 kg (256 lb 4.8 oz), SpO2 100 %.  Past Medical History  Diagnosis Date  . Varicose veins   . New onset atrial fibrillation ([place]) 01/02/2015  . Morbid obesity ([place]) 01/02/2015  . Hypertension   . Sleep apnea     been tested but has not received the CPAP yet  . GERD (gastroesophageal reflux disease)   . History of gout   . Cancer ([place])     colon  . Family history of colon cancer   . Family history of breast cancer in mother     Past Surgical History  Procedure Laterality Date  . Vein ligation and stripping  left leg  . Colonoscopy N/A 03/28/2015    SLF: 1. Abdominal pain, diarrhea, rectal bleeding due to obstructing colon mass  . Esophagogastroduodenoscopy N/A 03/28/2015    SLF: 1. dysphagia 2. Mild non-erosive gastritis.   . Esophageal dilation N/A 03/28/2015    Procedure: ESOPHAGEAL DILATION;  Surgeon: [name], MD;  [facility];   Service: Endoscopy;  Laterality: N/A;  . Partial colectomy N/A 04/03/2015    Procedure: PARTIAL COLECTOMY;  Surgeon: [name], MD;  Location: [facility] OR[facility];  Service: General;  Laterality: N/A;  . Portacath placement N/A 05/10/2015    Procedure: INSERTION PORT-A-CATH;  Surgeon: [name], MD;  Location: [facility];  Service: General;  Laterality: N/A;  left subclavian  . Skin cancer destruction Left 08/16/15    left side of nose and left back  . Colonoscopy N/A 10/03/2015    Procedure: COLONOSCOPY;  Surgeon: [name], MD;  [facility];  Service: Endoscopy;  Laterality: N/A;  . Colonoscopy N/A 10/02/2015    Procedure: COLONOSCOPY;  Surgeon: [name], MD;  [facility];  Service: Endoscopy;  Laterality: N/A;  1315    Family History  Problem Relation Age of Onset  . Breast cancer Mother     dx under 76  . Colon cancer Mother     dx in her mid 19s  . Heart disease Father   . Hyperlipidemia Father   . Liver cancer Father     heavy ETOH user when young  . Cancer Sister     slow "blood" cancer  . Diabetes Sister   . Diabetes Brother   . Cancer Maternal Aunt     2-3 maternal aunts with Cancer NOS  . Lung cancer Paternal Uncle     three uncles with lung cancer - all smokers  . Cancer Paternal Grandmother     possible bone cancer    Social History:  reports that he has never smoked. He has never used smokeless tobacco. He reports that he does not drink alcohol or use illicit drugs.  Allergies: No Known Allergies  Medications: Prior to Admission medications   Medication Sig Start Date End Date Taking? Authorizing Provider  ciprofloxacin (CILOXAN) 0.3 % ophthalmic solution Place 1 drop into the left eye every 4 (four) hours while awake.  08/22/15  Yes Historical Provider, MD  ELIQUIS 5 MG TABS tablet TAKE 1 TABLET BY MOUTH TWICE A DAY 08/16/15  Yes Historical Provider, MD  escitalopram (LEXAPRO) 20 MG tablet Take 1 tablet (20 mg total) by mouth daily.  07/17/15  Yes [name], PA-C  furosemide (LASIX) 20 MG tablet Take 1 tablet (20 mg total) by mouth daily. 10/13/15  Yes [name], MD  Linaclotide [facility]) 145 MCG CAPS capsule 1 PO 30 mins prior to your first meal Patient taking differently: Take 145 mcg by mouth daily. 1 PO 30 mins prior to your first meal 10/03/15  Yes [name], MD  lisinopril (PRINIVIL,ZESTRIL) 10 MG tablet Take 1 tablet (10 mg total) by mouth daily. 01/04/15  Yes [name], MD  omeprazole (PRILOSEC) 20 MG capsule Take 1 capsule (20 mg total) by mouth daily. 03/15/15  Yes [name], PA-C  potassium chloride SA (K-DUR,KLOR-CON) 20 MEQ tablet Take 1 tablet (20 mEq total) by mouth daily. 10/13/15  Yes [name], MD  psyllium (METAMUCIL) 58.6 % packet Take 1 packet by mouth daily.   Yes Historical Provider, MD  lidocaine-prilocaine (  EMLA) cream Apply a quarter size amount to port site 1 hour prior to chemo. Do not rub in. Cover with plastic wrap. 04/28/15   [name], MD  nitroGLYCERIN (NITROGLYN) 2 % ointment Apply 0.5 inches topically as needed for chest pain. For rectal area    Historical Provider, MD    Scheduled Meds: . apixaban  10 mg Oral BID  . ciprofloxacin  1 drop Left Eye Q4H while awake  . escitalopram  20 mg Oral Daily  . furosemide  40 mg Intravenous Q12H  . Linaclotide  145 mcg Oral Daily  . lisinopril  10 mg Oral Daily  . pantoprazole  40 mg Oral Daily  . [START ON 10/17/2015] potassium chloride SA  40 mEq Oral Daily  . psyllium  1 packet Oral Daily  . sodium chloride  3 mL Intravenous Q12H   Continuous Infusions:  PRN Meds:.sodium chloride, acetaminophen, ondansetron (ZOFRAN) IV, sodium chloride     Results for orders placed or performed during the hospital encounter of 10/15/15 (from the past 48 hour(s))  Basic metabolic panel     Status: Abnormal   Collection Time: 10/16/15  6:40 AM  Result Value Ref Range   Sodium 136 135 - 145 mmol/L   Potassium 3.5  3.5 - 5.1 mmol/L    Comment: DELTA CHECK NOTED   Chloride 100 (L) 101 - 111 mmol/L   CO2 28 22 - 32 mmol/L   Glucose, Bld 113 (H) 65 - 99 mg/dL   BUN 7 6 - 20 mg/dL   Creatinine, Ser 0.81 0.61 - 1.24 mg/dL   Calcium 8.3 (L) 8.9 - 10.3 mg/dL   GFR calc non Af Amer >60 >60 mL/min   GFR calc Af Amer >60 >60 mL/min    Comment: (NOTE) The eGFR has been calculated using the CKD EPI equation. This calculation has not been validated in all clinical situations. eGFR's persistently <60 mL/min signify possible Chronic Kidney Disease.    Anion gap 8 5 - 15    Studies/Results: Brain MRI are unrevealing.    Tashayla Therien A. [name], M.D.  Diplomate, Tax adviser of Psychiatry and Neurology ( Neurology). 10/16/2015, 5:44 PM

## 2015-10-16 NOTE — Care Management Note (Signed)
Case Management Note  Patient Details  Name: [name] MRN: [id number] Date of Birth: [birth year]  Subjective/Objective:                  Pt admitted from home with CHF. Pt lives with his wife and son and will return home at discharge. Pt has a cane for prn use. Pt has requested a rolling walker for home use.  Action/Plan: Pt would like rolling walker from [facility]. PT recommends HH PT at discharge. Family has PT in the family and would like to receive PT from the family member. Will follow up with the pt to see if [facility] services will need to be arranged.  Expected Discharge Date:                  Expected Discharge Plan:  [place]  In-House Referral:  NA  Discharge planning Services  CM Consult  Post Acute Care Choice:  Durable Medical Equipment Choice offered to:  Patient  DME Arranged:  Walker rolling DME Agency:  [place]:    [facility] Agency:     Status of Service:  In process, will continue to follow  Medicare Important Message Given:    Date Medicare IM Given:    Medicare IM give by:    Date Additional Medicare IM Given:    Additional Medicare Important Message give by:     If discussed at [place] of Stay Meetings, dates discussed:    Additional Comments:  Joylene Draft, RN 10/16/2015, 3:25 PM

## 2015-10-16 NOTE — Progress Notes (Signed)
Subjective: He was admitted with multiple medical problems. He has a DVT in his right leg. He has marked swelling of both legs. He had gained about 20 pounds. In addition to that he has trouble with his balance and has lost control of his stool. He is known to have atrial fibrillation and he was anticoagulated on Eliquis but he had been off that so he could have colonoscopy so I don't think his clot is a failure of his anticoagulant. He had echocardiogram about a month ago that showed normal left ventricular systolic function but diastolic function could not be assessed but it is felt that he probably has chronic diastolic heart failure. He says he feels a little better.  Objective: Vital signs in last 24 hours: Temp:  [97.8 F (36.6 C)-98.3 F (36.8 C)] 97.8 F (36.6 C) (10/17 0525) Pulse Rate:  [69-89] 89 (10/17 0525) Resp:  [18-21] 18 (10/17 0525) BP: (107-136)/(58-80) 107/64 mmHg (10/17 0525) SpO2:  [97 %-100 %] 97 % (10/17 0525) Weight:  [116.257 kg (256 lb 4.8 oz)-121.564 kg (268 lb)] 116.257 kg (256 lb 4.8 oz) (10/17 0700) Weight change:  Last BM Date: 10/15/15  Intake/Output from previous day: 10/16 0701 - 10/17 0700 In: 243 [P.O.:240; I.V.:3] Out: 4900 [Urine:4900]  PHYSICAL EXAM General appearance: alert, cooperative, mild distress and morbidly obese Resp: clear to auscultation bilaterally Cardio: irregularly irregular rhythm GI: soft, non-tender; bowel sounds normal; no masses,  no organomegaly Extremities: extremities normal, atraumatic, no cyanosis or edema  Lab Results:  Results for orders placed or performed during the hospital encounter of 10/15/15 (from the past 48 hour(s))  Basic metabolic panel     Status: Abnormal   Collection Time: 10/16/15  6:40 AM  Result Value Ref Range   Sodium 136 135 - 145 mmol/Beasley   Potassium 3.5 3.5 - 5.1 mmol/Beasley    Comment: DELTA CHECK NOTED   Chloride 100 (Beasley) 101 - 111 mmol/Beasley   CO2 28 22 - 32 mmol/Beasley   Glucose, Bld 113 (H) 65 - 99  mg/dL   BUN 7 6 - 20 mg/dL   Creatinine, Ser 0.81 0.61 - 1.24 mg/dL   Calcium 8.3 (Beasley) 8.9 - 10.3 mg/dL   GFR calc non Af Amer >60 >60 mL/min   GFR calc Af Amer >60 >60 mL/min    Comment: (NOTE) The eGFR has been calculated using the CKD EPI equation. This calculation has not been validated in all clinical situations. eGFR's persistently <60 mL/min signify possible Chronic Kidney Disease.    Anion gap 8 5 - 15    ABGS No results for input(s): PHART, PO2ART, TCO2, HCO3 in the last 72 hours.  Invalid input(s): PCO2 CULTURES No results found for this or any previous visit (from the past 240 hour(s)). Studies/Results: US Venous Img Lower Bilateral  10/15/2015  CLINICAL DATA:  68 year old male with bilateral lower extremity edema. Elevated D-dimer. History of congestive heart failure and morbid obesity. EXAM: BILATERAL LOWER EXTREMITY VENOUS DOPPLER ULTRASOUND TECHNIQUE: Gray-scale sonography with graded compression, as well as color Doppler and duplex ultrasound were performed to evaluate the lower extremity deep venous systems from the level of the common femoral vein and including the common femoral, femoral, profunda femoral, popliteal and calf veins including the posterior tibial, peroneal and gastrocnemius veins when visible. The superficial great saphenous vein was also interrogated. Spectral Doppler was utilized to evaluate flow at rest and with distal augmentation maneuvers in the common femoral, femoral and popliteal veins. COMPARISON:  None. FINDINGS: RIGHT  LOWER EXTREMITY Common Femoral Vein: No evidence of thrombus. Normal compressibility, respiratory phasicity and response to augmentation. Saphenofemoral Junction: No evidence of thrombus. Normal compressibility and flow on color Doppler imaging. Profunda Femoral Vein: No evidence of thrombus. Normal compressibility and flow on color Doppler imaging. Femoral Vein: Positive for thrombus (nonocclusive). Partial compressibility and  response to augmentation. Popliteal Vein: No evidence of thrombus. Normal compressibility, respiratory phasicity and response to augmentation. Calf Veins: Sub optimal evaluation. Superficial Great Saphenous Vein: No evidence of thrombus. Normal compressibility and flow on color Doppler imaging. Venous Reflux:  None. Other Findings:  None. LEFT LOWER EXTREMITY Common Femoral Vein: No evidence of thrombus. Normal compressibility, respiratory phasicity and response to augmentation. Saphenofemoral Junction: No evidence of thrombus. Normal compressibility and flow on color Doppler imaging. Profunda Femoral Vein: No evidence of thrombus. Normal compressibility and flow on color Doppler imaging. Femoral Vein: No evidence of thrombus. Normal compressibility, respiratory phasicity and response to augmentation. Popliteal Vein: No evidence of thrombus. Normal compressibility, respiratory phasicity and response to augmentation. Calf Veins: Sub optimal evaluation. Superficial Great Saphenous Vein: No evidence of thrombus. Normal compressibility and flow on color Doppler imaging. Venous Reflux:  None. Other Findings:  None. IMPRESSION: 1. Study is positive for nonocclusive deep venous thrombosis in the right femoral vein. 2. No evidence of deep venous thrombosis in the left lower extremity. 3. Limited study which was unable to accurately evaluate the calf veins bilaterally. Electronically Signed   By: Vinnie Langton M.D.   On: 10/15/2015 10:17   Dg Chest Port 1 View  10/15/2015  CLINICAL DATA:  Atrial fibrillation. Morbid obesity. Right lower extremity DVT. EXAM: PORTABLE CHEST 1 VIEW COMPARISON:  05/10/2015 FINDINGS: Left-sided power port remains in appropriate position. Heart size is stable. Both lungs are clear. No evidence of pneumothorax or pleural effusion. IMPRESSION: No active disease. Electronically Signed   By: Earle Gell M.D.   On: 10/15/2015 11:58    Medications:  Prior to Admission:  Prescriptions prior  to admission  Medication Sig Dispense Refill Last Dose  . ciprofloxacin (CILOXAN) 0.3 % ophthalmic solution Place 1 drop into the left eye every 4 (four) hours while awake.   0 10/14/2015 at Unknown time  . ELIQUIS 5 MG TABS tablet TAKE 1 TABLET BY MOUTH TWICE A DAY  12 10/14/2015 at 730  . escitalopram (LEXAPRO) 20 MG tablet Take 1 tablet (20 mg total) by mouth daily. 30 tablet 2 10/14/2015 at Unknown time  . furosemide (LASIX) 20 MG tablet Take 1 tablet (20 mg total) by mouth daily. 90 tablet 3 10/14/2015 at Unknown time  . Linaclotide (LINZESS) 145 MCG CAPS capsule 1 PO 30 mins prior to your first meal (Patient taking differently: Take 145 mcg by mouth daily. 1 PO 30 mins prior to your first meal) 30 capsule 11 10/14/2015 at Unknown time  . lisinopril (PRINIVIL,ZESTRIL) 10 MG tablet Take 1 tablet (10 mg total) by mouth daily. 30 tablet 12 10/14/2015 at Unknown time  . omeprazole (PRILOSEC) 20 MG capsule Take 1 capsule (20 mg total) by mouth daily. 30 capsule 3 10/14/2015 at Unknown time  . potassium chloride SA (K-DUR,KLOR-CON) 20 MEQ tablet Take 1 tablet (20 mEq total) by mouth daily. 90 tablet 3 10/14/2015 at Unknown time  . psyllium (METAMUCIL) 58.6 % packet Take 1 packet by mouth daily.   10/14/2015 at Unknown time  . lidocaine-prilocaine (EMLA) cream Apply a quarter size amount to port site 1 hour prior to chemo. Do not rub in. Cover  with plastic wrap. 30 g 3 10/12/2015  . nitroGLYCERIN (NITROGLYN) 2 % ointment Apply 0.5 inches topically as needed for chest pain. For rectal area   unknown   Scheduled: . apixaban  10 mg Oral BID  . ciprofloxacin  1 drop Left Eye Q4H while awake  . escitalopram  20 mg Oral Daily  . furosemide  40 mg Intravenous Q12H  . Linaclotide  145 mcg Oral Daily  . lisinopril  10 mg Oral Daily  . pantoprazole  40 mg Oral Daily  . potassium chloride SA  20 mEq Oral Daily  . psyllium  1 packet Oral Daily  . sodium chloride  3 mL Intravenous Q12H   Continuous:   [name]:CBIPJR chloride, acetaminophen, ondansetron (ZOFRAN) IV, sodium chloride  Assesment: He was admitted with acute diastolic heart failure. On my physical examination above I mistakenly put that he did not have any edema but he has 3+ edema of both legs.  He has chronic atrial fibrillation and he is chronically anticoagulated  He has a history of colon cancer and he is undergoing chemotherapy for that  He has morbid obesity which is unchanged  He has balance disorder and I'm not sure what that is from.  He has trouble controlling his bowels Principal Problem:   Acute diastolic CHF (congestive heart failure) (HCC) Active Problems:   Morbid obesity (HCC)   Colon carcinoma (HCC)   Peripheral edema   Generalized weakness   DVT (deep venous thrombosis) (HCC)   Atrial fibrillation ([place])    Plan: He had MRI of his brain which was okay. I'm going to get MRI of the cervical spine and see if there something else going on that may be causing his problems with allergies and control of his stool. I have requested cardiology consultation for further management of his heart failure and neurology consultation to see what's going on with his balance. He will probably need PT consult as well      [name] 10/16/2015, 8:49 AM

## 2015-10-17 ENCOUNTER — Encounter (HOSPITAL_COMMUNITY): Payer: Self-pay | Admitting: Gastroenterology

## 2015-10-17 DIAGNOSIS — I82409 Acute embolism and thrombosis of unspecified deep veins of unspecified lower extremity: Secondary | ICD-10-CM

## 2015-10-17 DIAGNOSIS — R159 Full incontinence of feces: Secondary | ICD-10-CM

## 2015-10-17 DIAGNOSIS — I4891 Unspecified atrial fibrillation: Secondary | ICD-10-CM

## 2015-10-17 DIAGNOSIS — R197 Diarrhea, unspecified: Secondary | ICD-10-CM

## 2015-10-17 DIAGNOSIS — C189 Malignant neoplasm of colon, unspecified: Secondary | ICD-10-CM

## 2015-10-17 DIAGNOSIS — R945 Abnormal results of liver function studies: Secondary | ICD-10-CM | POA: Diagnosis present

## 2015-10-17 DIAGNOSIS — R531 Weakness: Secondary | ICD-10-CM

## 2015-10-17 DIAGNOSIS — R7989 Other specified abnormal findings of blood chemistry: Secondary | ICD-10-CM

## 2015-10-17 LAB — BASIC METABOLIC PANEL
Anion gap: 6 (ref 5–15)
BUN: 9 mg/dL (ref 6–20)
CALCIUM: 8.4 mg/dL — AB (ref 8.9–10.3)
CO2: 32 mmol/L (ref 22–32)
Chloride: 101 mmol/L (ref 101–111)
Creatinine, Ser: 0.97 mg/dL (ref 0.61–1.24)
GFR calc Af Amer: >60 mL/min (ref >60)
Glucose, Bld: 99 mg/dL (ref 65–99)
Potassium: 3.6 mmol/L (ref 3.5–5.1)
Sodium: 139 mmol/L (ref 135–145)

## 2015-10-17 LAB — VITAMIN B12: Vitamin B-12: 786 pg/mL (ref 180–914)

## 2015-10-17 LAB — TSH: TSH: 3.085 u[IU]/mL (ref 0.350–4.500)

## 2015-10-17 MED ORDER — SODIUM CHLORIDE 0.9 % IV SOLN
1000.0000 mg | INTRAVENOUS | Status: DC
  Administered 2015-10-17 – 2015-10-18 (×2): 1000 mg via INTRAVENOUS
  Filled 2015-10-17 (×4): qty 8

## 2015-10-17 MED ORDER — APIXABAN 5 MG PO TABS: 5.0000 mg | ORAL_TABLET | Freq: Two times a day (BID) | ORAL | Status: DC

## 2015-10-17 NOTE — Progress Notes (Signed)
Subjective: He says he feels somewhat better. Consults are appreciated . He has less edema today.  Objective: Vital signs in last 24 hours: Temp:  [98 F (36.7 C)-98.3 F (36.8 C)] 98 F (36.7 C) (10/18 0618) Pulse Rate:  [79-81] 81 (10/18 0618) Resp:  [18-20] 18 (10/18 0618) BP: (105-108)/(64-67) 106/67 mmHg (10/18 0618) SpO2:  [96 %-100 %] 96 % (10/18 0618) Weight:  [109.77 kg (242 lb)] 109.77 kg (242 lb) (10/18 0618) Weight change: -11.794 kg (-26 lb) Last BM Date: 10/16/15  Intake/Output from previous day: 10/17 0701 - 10/18 0700 In: 720 [P.O.:720] Out: 5900 [Urine:5900]  PHYSICAL EXAM General appearance: alert, cooperative, no distress and morbidly obese Resp: clear to auscultation bilaterally Cardio: irregularly irregular rhythm GI: soft, non-tender; bowel sounds normal; no masses,  no organomegaly Extremities: extremities normal, atraumatic, no cyanosis or edema  Lab Results:  Results for orders placed or performed during the hospital encounter of 10/15/15 (from the past 48 hour(s))  Basic metabolic panel     Status: Abnormal   Collection Time: 10/16/15  6:40 AM  Result Value Ref Range   Sodium 136 135 - 145 mmol/L   Potassium 3.5 3.5 - 5.1 mmol/L    Comment: DELTA CHECK NOTED   Chloride 100 (L) 101 - 111 mmol/L   CO2 28 22 - 32 mmol/L   Glucose, Bld 113 (H) 65 - 99 mg/dL   BUN 7 6 - 20 mg/dL   Creatinine, Ser 0.81 0.61 - 1.24 mg/dL   Calcium 8.3 (L) 8.9 - 10.3 mg/dL   GFR calc non Af Amer >60 >60 mL/min   GFR calc Af Amer >60 >60 mL/min    Comment: (NOTE) The eGFR has been calculated using the CKD EPI equation. This calculation has not been validated in all clinical situations. eGFR's persistently <60 mL/min signify possible Chronic Kidney Disease.    Anion gap 8 5 - 15  Basic metabolic panel     Status: Abnormal   Collection Time: 10/17/15  6:39 AM  Result Value Ref Range   Sodium 139 135 - 145 mmol/L   Potassium 3.6 3.5 - 5.1 mmol/L   Chloride 101  101 - 111 mmol/L   CO2 32 22 - 32 mmol/L   Glucose, Bld 99 65 - 99 mg/dL   BUN 9 6 - 20 mg/dL   Creatinine, Ser 0.97 0.61 - 1.24 mg/dL   Calcium 8.4 (L) 8.9 - 10.3 mg/dL   GFR calc non Af Amer >60 >60 mL/min   GFR calc Af Amer >60 >60 mL/min    Comment: (NOTE) The eGFR has been calculated using the CKD EPI equation. This calculation has not been validated in all clinical situations. eGFR's persistently <60 mL/min signify possible Chronic Kidney Disease.    Anion gap 6 5 - 15    ABGS No results for input(s): PHART, PO2ART, TCO2, HCO3 in the last 72 hours.  Invalid input(s): PCO2 CULTURES No results found for this or any previous visit (from the past 240 hour(s)). Studies/Results: Mr Cervical Spine Wo Contrast  10/16/2015  CLINICAL DATA:  Neck pain with bilateral arm numbness. History of colon cancer. Initial encounter. EXAM: MRI CERVICAL SPINE WITHOUT CONTRAST TECHNIQUE: Multiplanar, multisequence MR imaging of the cervical spine was performed. No intravenous contrast was administered. COMPARISON:  Cervical spine CT 05/22/2013. FINDINGS: The alignment is stable with a mild scoliosis. No evidence of acute fracture or paraspinal abnormality. No suspicious marrow lesions. The craniocervical junction appears normal. The cervical cord is normal in  signal and caliber. There are bilateral vertebral artery flow voids. There is stable tracheal deviation the right at the thoracic inlet. C2-3:  Normal interspace. C3-4: Stable uncinate spurring bilaterally, contributing to mild narrowing of both foramina. No cord deformity. C4-5: There is moderate spondylosis with posterior osteophytes covering diffusely bulging disc material. There is an asymmetric disc osteophyte complex on the left which contributes to left-sided cord flattening and asymmetric narrowing of the left foramen. There is probable resulting left C5 nerve root encroachment. The right foramen appears only mildly narrowed. C5-6: Chronic  spondylosis with posterior osteophytes covering diffusely bulging disc material. The CSF surrounding the cord is partially effaced with narrowing of the AP diameter of the canal to 8 mm. Uncinate spurring contributes to mild to moderate foraminal narrowing bilaterally. C6-7: Small central disc protrusion. No cord deformity or foraminal compromise. C7-T1:  Normal interspace. IMPRESSION: 1. Asymmetric cord flattening on the left at C4-5 secondary to spondylosis and a superimposed left foraminal disc osteophyte complex. Probable associated left C5 nerve root encroachment. 2. Spondylosis at C5-6 contributes to mild-to-moderate spinal stenosis and biforaminal narrowing. 3. Mild uncinate spurring at C3-4 with resulting mild biforaminal narrowing. 4. No evidence of metastatic disease. Electronically Signed   By: Richardean Sale M.D.   On: 10/16/2015 10:12   US Venous Img Lower Bilateral  10/15/2015  CLINICAL DATA:  69 year old male with bilateral lower extremity edema. Elevated D-dimer. History of congestive heart failure and morbid obesity. EXAM: BILATERAL LOWER EXTREMITY VENOUS DOPPLER ULTRASOUND TECHNIQUE: Gray-scale sonography with graded compression, as well as color Doppler and duplex ultrasound were performed to evaluate the lower extremity deep venous systems from the level of the common femoral vein and including the common femoral, femoral, profunda femoral, popliteal and calf veins including the posterior tibial, peroneal and gastrocnemius veins when visible. The superficial great saphenous vein was also interrogated. Spectral Doppler was utilized to evaluate flow at rest and with distal augmentation maneuvers in the common femoral, femoral and popliteal veins. COMPARISON:  None. FINDINGS: RIGHT LOWER EXTREMITY Common Femoral Vein: No evidence of thrombus. Normal compressibility, respiratory phasicity and response to augmentation. Saphenofemoral Junction: No evidence of thrombus. Normal compressibility and  flow on color Doppler imaging. Profunda Femoral Vein: No evidence of thrombus. Normal compressibility and flow on color Doppler imaging. Femoral Vein: Positive for thrombus (nonocclusive). Partial compressibility and response to augmentation. Popliteal Vein: No evidence of thrombus. Normal compressibility, respiratory phasicity and response to augmentation. Calf Veins: Sub optimal evaluation. Superficial Great Saphenous Vein: No evidence of thrombus. Normal compressibility and flow on color Doppler imaging. Venous Reflux:  None. Other Findings:  None. LEFT LOWER EXTREMITY Common Femoral Vein: No evidence of thrombus. Normal compressibility, respiratory phasicity and response to augmentation. Saphenofemoral Junction: No evidence of thrombus. Normal compressibility and flow on color Doppler imaging. Profunda Femoral Vein: No evidence of thrombus. Normal compressibility and flow on color Doppler imaging. Femoral Vein: No evidence of thrombus. Normal compressibility, respiratory phasicity and response to augmentation. Popliteal Vein: No evidence of thrombus. Normal compressibility, respiratory phasicity and response to augmentation. Calf Veins: Sub optimal evaluation. Superficial Great Saphenous Vein: No evidence of thrombus. Normal compressibility and flow on color Doppler imaging. Venous Reflux:  None. Other Findings:  None. IMPRESSION: 1. Study is positive for nonocclusive deep venous thrombosis in the right femoral vein. 2. No evidence of deep venous thrombosis in the left lower extremity. 3. Limited study which was unable to accurately evaluate the calf veins bilaterally. Electronically Signed   By: Vinnie Langton  M.D.   On: 10/15/2015 10:17   Dg Chest Port 1 View  10/15/2015  CLINICAL DATA:  Atrial fibrillation. Morbid obesity. Right lower extremity DVT. EXAM: PORTABLE CHEST 1 VIEW COMPARISON:  05/10/2015 FINDINGS: Left-sided power port remains in appropriate position. Heart size is stable. Both lungs are  clear. No evidence of pneumothorax or pleural effusion. IMPRESSION: No active disease. Electronically Signed   By: Earle Gell M.D.   On: 10/15/2015 11:58    Medications:  Prior to Admission:  Prescriptions prior to admission  Medication Sig Dispense Refill Last Dose  . ciprofloxacin (CILOXAN) 0.3 % ophthalmic solution Place 1 drop into the left eye every 4 (four) hours while awake.   0 10/14/2015 at Unknown time  . ELIQUIS 5 MG TABS tablet TAKE 1 TABLET BY MOUTH TWICE A DAY  12 10/14/2015 at 730  . escitalopram (LEXAPRO) 20 MG tablet Take 1 tablet (20 mg total) by mouth daily. 30 tablet 2 10/14/2015 at Unknown time  . furosemide (LASIX) 20 MG tablet Take 1 tablet (20 mg total) by mouth daily. 90 tablet 3 10/14/2015 at Unknown time  . Linaclotide (LINZESS) 145 MCG CAPS capsule 1 PO 30 mins prior to your first meal (Patient taking differently: Take 145 mcg by mouth daily. 1 PO 30 mins prior to your first meal) 30 capsule 11 10/14/2015 at Unknown time  . lisinopril (PRINIVIL,ZESTRIL) 10 MG tablet Take 1 tablet (10 mg total) by mouth daily. 30 tablet 12 10/14/2015 at Unknown time  . omeprazole (PRILOSEC) 20 MG capsule Take 1 capsule (20 mg total) by mouth daily. 30 capsule 3 10/14/2015 at Unknown time  . potassium chloride SA (K-DUR,KLOR-CON) 20 MEQ tablet Take 1 tablet (20 mEq total) by mouth daily. 90 tablet 3 10/14/2015 at Unknown time  . psyllium (METAMUCIL) 58.6 % packet Take 1 packet by mouth daily.   10/14/2015 at Unknown time  . lidocaine-prilocaine (EMLA) cream Apply a quarter size amount to port site 1 hour prior to chemo. Do not rub in. Cover with plastic wrap. 30 g 3 10/12/2015  . nitroGLYCERIN (NITROGLYN) 2 % ointment Apply 0.5 inches topically as needed for chest pain. For rectal area   unknown   Scheduled: . apixaban  10 mg Oral BID  . ciprofloxacin  1 drop Left Eye Q4H while awake  . escitalopram  20 mg Oral Daily  . furosemide  40 mg Intravenous Q12H  . Linaclotide  145 mcg Oral  Daily  . lisinopril  10 mg Oral Daily  . pantoprazole  40 mg Oral Daily  . potassium chloride SA  40 mEq Oral Daily  . psyllium  1 packet Oral Daily  . sodium chloride  3 mL Intravenous Q12H   Continuous:  [facility]:NOBSJG chloride, acetaminophen, ondansetron (ZOFRAN) IV, sodium chloride  Assesment: He was admitted with acute on chronic diastolic heart failure. He's doing better. He has DVT in his right leg and is back on DVT dose of eliquis. He has significant weakness and he's been off balance. It is not clear what that is from but his MRI of the neck did show some areas that may cause this. He has neurology consultation underway and I will discuss this with Dr. [name] to see if he thinks that the potential cause. He is complaining that he has incontinence of stool so I'm going to go and ask his GI team to see him. Principal Problem:   Acute diastolic CHF (congestive heart failure) (H[facility]) Active Problems:   Morbid obesity ([place])  Colon carcinoma ([facility])   Peripheral edema   Generalized weakness   DVT (deep venous thrombosis) ([facility])   Atrial fibrillation ([place])    Plan: Continue treatment. GI consultation. Continue diuresis. Continue anticoagulation. Check orthostatics.    LOS: 1 day   Kallan Bischoff L 10/17/2015, 8:24 AM

## 2015-10-17 NOTE — Consult Note (Signed)
Referring Provider: [name], MD Primary Care Physician:  [name], MD Primary Gastroenterologist:  Barney Drain, MD  Reason for Consultation:  Fecal incontinence  HPI: [name] is a 69 y.o. male with h/o Stage IIA adenocarcinoma of colon, s/p definitive surgery by Dr. Arnoldo Morale on 04/03/15. Completed 7 cycles of adjuvant FOLFOX, per patient last cycle over six weeks ago. DID NOT require XRT. Developed elevated bilirubin, so chemo held. U/S suggestive of fatty liver. CT 09/18/15 with no metastatic disease. But showed mild circumferential wall thickening in distal sigmoid and rectum. Mild associated perirectal edema/inflammation. Persistent marked vascular collateralization in left abd and some nodularity to liver contour but no definite cirrhosis. Patient underwent attempted colonoscopy 10/02/15 which showed normal anastomosis and 3 anal fissures but limited views of colonic mucosa. Repeat colonoscopy 10/03/15 with fair prep showeing anal fissures and verrucous anal canal lesion (benign path). Next colonoscopy planned in one year. Patient's Eliquis held from 09/30/15 to 10/06/15.   Patient presented for lower extremity edema and redness on 10/14/15 and w/u consistent with CHF exacerbation. Positive D-dimer so following day had outpatient doppler which was positive for RLE DVT. Patient admitted for DVT, generalized weakness, volume overload. Weight up over 20 pounds in four weeks. ECHO last month with LVEF 96-22%, likely diastolic dysfunction however could not evaluate due to Afib. Last seen by cardiology on 10/13/15. Also with balance issues, recent MRI brain negative. Seen by Dr. [name] today in consultation.   We were asked to see patient related to fecal incontinence/soiling. Spoke with patient and wife. She reports that he has 3-4 loose stools daily for nearly two months. Patient denies getting urge to have BM. He soils himself without warning. He knows that he has soiled himself  after it occurs. No loss of bladder function. No recent brbpr. No rectal pain. Started on Linzess 179mg daily on 10/06/15 for suspected constipation/?spurious diarrhea given 2 colonoscopies with poor prep. Denies improvement in symptoms. No abdominal pain. No appetite issues. No N/V or heartburn.    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 Extensive serological work up by Dr. FOneida Alar   Prior to Admission medications   Medication Sig Start Date End Date Taking? Authorizing Provider  ciprofloxacin (CILOXAN) 0.3 % ophthalmic solution Place 1 drop into the left eye every 4 (four) hours while awake.  08/22/15  Yes Historical Provider, MD  ELIQUIS 5 MG TABS tablet TAKE 1 TABLET BY MOUTH TWICE A DAY 08/16/15  Yes Historical Provider, MD  escitalopram (LEXAPRO) 20 MG tablet Take 1 tablet (20 mg total) by mouth daily. 07/17/15  Yes TManon HildingKefalas, PA-C  furosemide (LASIX) 20 MG tablet Take 1 tablet (20 mg total) by mouth daily. 10/13/15  Yes JArnoldo Lenis MD  Linaclotide ([facility] 145 MCG CAPS capsule 1 PO 30 mins prior to your first meal Patient taking differently: Take 145 mcg by mouth daily. 1 PO 30 mins prior to your first meal 10/03/15  Yes SDanie Binder MD  lisinopril (PRINIVIL,ZESTRIL) 10 MG tablet Take 1 tablet (10 mg total) by mouth daily. 01/04/15  Yes ESinda Du MD  omeprazole (PRILOSEC) 20 MG capsule Take 1 capsule (20 mg total) by mouth daily. 03/15/15  Yes LMahala Menghini PA-C  potassium chloride SA (K-DUR,KLOR-CON) 20 MEQ tablet Take 1 tablet (20 mEq total) by mouth daily. 10/13/15  Yes JArnoldo Lenis MD  psyllium (METAMUCIL) 58.6 % packet Take 1 packet by mouth daily.   Yes Historical Provider, MD  lidocaine-prilocaine (EMLA) cream Apply a quarter size amount to port site 1 hour prior to chemo. Do not rub in. Cover with plastic wrap. 04/28/15   [name], MD  nitroGLYCERIN (NITROGLYN) 2 % ointment Apply 0.5 inches topically as needed for chest  pain. For rectal area    Historical Provider, MD    Current Facility-Administered Medications  Medication Dose Route Frequency Provider Last Rate Last Dose  . 0.9 %  sodium chloride infusion  250 mL Intravenous PRN Samuella Cota, MD      . acetaminophen (TYLENOL) tablet 650 mg  650 mg Oral Q4H PRN Samuella Cota, MD      . apixaban Arne [place]) tablet 10 mg  10 mg Oral BID Samuella Cota, MD   10 mg at 10/16/15 2058  . ciprofloxacin (CILOXAN) 0.3 % ophthalmic solution 1 drop  1 drop Left Eye Q4H while awake Samuella Cota, MD   1 drop at 10/17/15 0630  . escitalopram (LEXAPRO) tablet 20 mg  20 mg Oral Daily Samuella Cota, MD   20 mg at 10/16/15 0843  . furosemide (LASIX) injection 40 mg  40 mg Intravenous Q12H Samuella Cota, MD   40 mg at 10/17/15 0208  . Linaclotide (LINZESS) capsule 145 mcg  145 mcg Oral Daily Samuella Cota, MD   145 mcg at 10/16/15 [phone]  . lisinopril (PRINIVIL,ZESTRIL) tablet 10 mg  10 mg Oral Daily Samuella Cota, MD   10 mg at 10/16/15 0843  . ondansetron (ZOFRAN) injection 4 mg  4 mg Intravenous Q6H PRN Samuella Cota, MD      . pantoprazole (PROTONIX) EC tablet 40 mg  40 mg Oral Daily Samuella Cota, MD   40 mg at 10/16/15 [phone]  . potassium chloride SA (K-DUR,KLOR-CON) CR tablet 40 mEq  40 mEq Oral Daily Lendon Colonel, NP      . psyllium (HYDROCIL/METAMUCIL) packet 1 packet  1 packet Oral Daily Samuella Cota, MD   1 packet at 10/16/15 [phone]  . sodium chloride 0.9 % injection 3 mL  3 mL Intravenous Q12H Samuella Cota, MD   3 mL at 10/16/15 2058  . sodium chloride 0.9 % injection 3 mL  3 mL Intravenous PRN Samuella Cota, MD        Allergies as of 10/15/2015  . (No Known Allergies)    Past Medical History  Diagnosis Date  . Varicose veins   . New onset atrial fibrillation ([place]) 01/02/2015  . Morbid obesity ([place]) 01/02/2015  . Hypertension   . Sleep apnea     been tested but has not received the CPAP yet  . GERD  (gastroesophageal reflux disease)   . History of gout   . Cancer ([place]) 02/2015    colon  . Family history of colon cancer   . Family history of breast cancer in mother     Past Surgical History  Procedure Laterality Date  . Vein ligation and stripping      left leg  . Colonoscopy N/A 03/28/2015    SLF: 1. Abdominal pain, diarrhea, rectal bleeding due to obstructing colon mass  . Esophagogastroduodenoscopy N/A 03/28/2015    SLF: 1. dysphagia 2. Mild non-erosive gastritis.   . Esophageal dilation N/A 03/28/2015    Procedure: ESOPHAGEAL DILATION;  Surgeon: [name], MD;  Location: [facility];  Service: Endoscopy;  Laterality: N/A;  . Partial colectomy N/A 04/03/2015    Procedure: PARTIAL COLECTOMY;  Surgeon:  [name], MD;  Location: [facility] ORS;  Service: General;  Laterality: N/A;  . Portacath placement N/A 05/10/2015    Procedure: INSERTION PORT-A-CATH;  Surgeon: [name], MD;  Location: [facility] ORS;  Service: General;  Laterality: N/A;  left subclavian  . Skin cancer destruction Left 08/16/15    left side of nose and left back  . Colonoscopy N/A 10/03/2015    SLF: normal anastomosis, fair prep (polyps less than 1cm could be missed), anal fissures and verrucous anal canal lesion with benign biopsy. Next TCS in 09/2016.  . Colonoscopy N/A 10/02/2015    SLF: normal anastomosis, poor bowel prep, three anal fissures    Family History  Problem Relation Age of Onset  . Breast cancer Mother     dx under 48  . Colon cancer Mother     dx in her mid 47s  . Heart disease Father   . Hyperlipidemia Father   . Liver cancer Father     heavy ETOH user when young  . Cancer Sister     slow "blood" cancer  . Diabetes Sister   . Diabetes Brother   . Cancer Maternal Aunt     2-3 maternal aunts with Cancer NOS  . Lung cancer Paternal Uncle     three uncles with lung cancer - all smokers  . Cancer Paternal Grandmother     possible bone cancer    Social History   Social History  .  Marital Status: Married    Spouse Name: [name]  . Number of Children: 2  . Years of Education: N/A   Occupational History  . Not on file.   Social History Main Topics  . Smoking status: Never Smoker   . Smokeless tobacco: Never Used  . Alcohol Use: No  . Drug Use: No  . Sexual Activity: No   Other Topics Concern  . Not on file   Social History Narrative     ROS:  General: Negative for anorexia, weight loss, fever, chills. + fatigue, weakness. Eyes: Negative for vision changes.  ENT: Negative for hoarseness, difficulty swallowing , nasal congestion. CV: Negative for chest pain, angina, palpitations, dyspnea on exertion. + peripheral edema.  Respiratory: Negative for dyspnea at rest, dyspnea on exertion, cough, sputum, wheezing.  GI: See history of present illness. GU:  Negative for dysuria, hematuria, urinary incontinence, urinary frequency, nocturnal urination.  MS: Negative for joint pain, low back pain.  Derm: Negative for rash or itching.  Neuro: Negative for weakness, abnormal sensation, seizure, frequent headaches, memory loss, confusion. See hpi. Psych: Negative for anxiety, depression, suicidal ideation, hallucinations.  Endo: Negative for unusual weight change.  Heme: Negative for bruising or bleeding. Allergy: Negative for rash or hives.       Physical Examination: Vital signs in last 24 hours: Temp:  [98 F (36.7 C)-98.3 F (36.8 C)] 98 F (36.7 C) (10/18 0618) Pulse Rate:  [79-81] 81 (10/18 0618) Resp:  [18-20] 18 (10/18 0618) BP: (105-108)/(64-67) 106/67 mmHg (10/18 0618) SpO2:  [96 %-100 %] 96 % (10/18 0618) Weight:  [242 lb (109.77 kg)] 242 lb (109.77 kg) (10/18 0618) Last BM Date: 10/16/15  General: chronically ill-appearing male in no acute distress.  Head: Normocephalic, atraumatic.   Eyes: Conjunctiva pink, no icterus. Mouth: Oropharyngeal mucosa moist and pink , no lesions erythema or exudate. Neck: Supple without thyromegaly, masses, or  lymphadenopathy.  Lungs: Clear to auscultation bilaterally.  Heart: Regular rate and rhythm, no murmurs rubs or gallops.  Abdomen:  Bowel sounds are normal, nontender, nondistended  Rectal: patient in chair, required PT for ambulation. DRE not performed. Will perform in AM. Extremities: 2+ pitting edema to knees bilaterally.  Neuro: Alert and oriented x 4 , grossly normal neurologically.  Skin: Warm and dry, no rash or jaundice.   Psych: Alert and cooperative, normal mood and affect.        Intake/Output from previous day: 10/17 0701 - 10/18 0700 In: 720 [P.O.:720] Out: 5900 [Urine:5900] Intake/Output this shift:    Lab Results: CBC  Recent Labs  10/14/15 1334  WBC 3.4*  HGB 11.3*  HCT 33.5*  MCV 103.1*  PLT 83*   BMET  Recent Labs  10/14/15 1334 10/16/15 0640 10/17/15 0639  NA 136 136 139  K 4.6 3.5 3.6  CL 107 100* 101  CO2 27 28 32  GLUCOSE 102* 113* 99  BUN 7 7 9   CREATININE 0.75 0.81 0.97  CALCIUM 8.7* 8.3* 8.4*   LFT  Recent Labs  10/14/15 1334  BILITOT 2.1*  ALKPHOS 133*  AST 71*  ALT 36  PROT 6.1*  ALBUMIN 2.2*    Lipase No results for input(s): LIPASE in the last 72 hours.  PT/INR No results for input(s): LABPROT, INR in the last 72 hours.    Imaging Studies: Mr Jeri Cos Wo Contrast  Oct 23, 2015  CLINICAL DATA:  Weakness and syncope for 2 months. Dizziness. Personal history of colon cancer. EXAM: MRI HEAD WITHOUT AND WITH CONTRAST TECHNIQUE: Multiplanar, multiecho pulse sequences of the brain and surrounding structures were obtained without and with intravenous contrast. CONTRAST:  7m MULTIHANCE GADOBENATE DIMEGLUMINE 529 MG/ML IV SOLN COMPARISON:  CT head without contrast 05/22/2013 FINDINGS: Mild atrophy is within normal limits for age. No acute infarct, hemorrhage, or mass lesion is present. The ventricles are of normal size. No significant extraaxial fluid collection is present. No significant white matter disease is present. The internal  auditory canals and brainstem are normal. Flow is present in the major intracranial arteries. The globes and orbits are intact. Of mucosal thickening is present along the floor of the maxillary sinuses bilaterally. The remaining paranasal sinuses are clear. The skullbase is within normal limits. Midline structures are unremarkable. There is some motion degradation of the study, particularly on the postcontrast images. No pathologic enhancement is present to suggest metastatic disease to the brain or meninges. IMPRESSION: 1. Normal MRI appearance of the brain for age. 2. Minimal maxillary sinus disease. Electronically Signed   By: CSan MorelleM.D.   On: [id number]:11   Mr Cervical Spine Wo Contrast  10/16/2015  CLINICAL DATA:  Neck pain with bilateral arm numbness. History of colon cancer. Initial encounter. EXAM: MRI CERVICAL SPINE WITHOUT CONTRAST TECHNIQUE: Multiplanar, multisequence MR imaging of the cervical spine was performed. No intravenous contrast was administered. COMPARISON:  Cervical spine CT 05/22/2013. FINDINGS: The alignment is stable with a mild scoliosis. No evidence of acute fracture or paraspinal abnormality. No suspicious marrow lesions. The craniocervical junction appears normal. The cervical cord is normal in signal and caliber. There are bilateral vertebral artery flow voids. There is stable tracheal deviation the right at the thoracic inlet. C2-3:  Normal interspace. C3-4: Stable uncinate spurring bilaterally, contributing to mild narrowing of both foramina. No cord deformity. C4-5: There is moderate spondylosis with posterior osteophytes covering diffusely bulging disc material. There is an asymmetric disc osteophyte complex on the left which contributes to left-sided cord flattening and asymmetric narrowing of the left foramen. There is probable resulting left  C5 nerve root encroachment. The right foramen appears only mildly narrowed. C5-6: Chronic spondylosis with posterior  osteophytes covering diffusely bulging disc material. The CSF surrounding the cord is partially effaced with narrowing of the AP diameter of the canal to 8 mm. Uncinate spurring contributes to mild to moderate foraminal narrowing bilaterally. C6-7: Small central disc protrusion. No cord deformity or foraminal compromise. C7-T1:  Normal interspace. IMPRESSION: 1. Asymmetric cord flattening on the left at C4-5 secondary to spondylosis and a superimposed left foraminal disc osteophyte complex. Probable associated left C5 nerve root encroachment. 2. Spondylosis at C5-6 contributes to mild-to-moderate spinal stenosis and biforaminal narrowing. 3. Mild uncinate spurring at C3-4 with resulting mild biforaminal narrowing. 4. No evidence of metastatic disease. Electronically Signed   By: Richardean Sale M.D.   On: 10/16/2015 10:12   Ct Abdomen Pelvis W Contrast  09/18/2015  CLINICAL DATA:  Subsequent encounter for colon cancer with elevated bilirubin EXAM: CT ABDOMEN AND PELVIS WITH CONTRAST TECHNIQUE: Multidetector CT imaging of the abdomen and pelvis was performed using the standard protocol following bolus administration of intravenous contrast. CONTRAST:  132m OMNIPAQUE IOHEXOL 300 MG/ML  SOLN COMPARISON:  03/29/2015. FINDINGS: Lower chest:  Unremarkable. Hepatobiliary: No focal abnormality within the liver parenchyma. The liver shows diffusely decreased attenuation suggesting steatosis. There is no evidence for gallstones, gallbladder wall thickening, or pericholecystic fluid. No intrahepatic or extrahepatic biliary dilation. Pancreas: No focal mass lesion. No dilatation of the main duct. No intraparenchymal cyst. No peripancreatic edema. Spleen: No splenomegaly. No focal mass lesion. Adrenals/Urinary Tract: 15 mm left adrenal nodule, not substantially changed in the interval. This nodule has very low attenuation and is likely a benign adrenal adenoma. The right adrenal gland is normal in appearance. No enhancing  lesion in either kidney. No hydronephrosis. No evidence for hydroureter. The urinary bladder appears normal for the degree of distention. Stomach/Bowel: Stomach is nondistended. No gastric wall thickening. No evidence of outlet obstruction. Duodenum diverticulum noted. No small bowel wall thickening. No small bowel dilatation. The terminal ileum is normal. The appendix is normal. Surgical suture line noted in the left colon in the region of the proximal sigmoid segment. Circumferential wall thickening is seen to a mild degree in the distal sigmoid colon and rectum. There is mild associated perirectal edema/ inflammation. Vascular/Lymphatic: No abdominal aortic aneurysm. There is marked vascular collateralization in the left abdomen as before. The portal vein and superior mesenteric vein are patent. Splenic vein is patent. There is no gastrohepatic or hepatoduodenal ligament lymphadenopathy. No intraperitoneal or retroperitoneal lymphadenopy. No pelvic sidewall lymphadenopathy. Reproductive: The prostate gland and seminal vesicles have normal imaging features. Other: No intraperitoneal free fluid. Musculoskeletal: Small right inguinal hernia contains only fat. 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 Bone windows reveal no worrisome lytic or sclerotic osseous lesions. IMPRESSION: 1. Interval development of mild circumferential wall thickening in the rectosigmoid junction and rectum with mild perirectal edema/inflammation. Infectious or inflammatory colitis could have this appearance. Radiation change could also have these features on CT. 2. Interval resection of proximal sigmoid colon mass. 3. No CT findings to suggest metastatic disease in the abdomen or pelvis. 4. Stable left adrenal nodule, most likely adenoma. 5. Persistent marked vascular collateralization in the left abdomen. There is some nodularity to the liver contour, but imaging features are not definitive for cirrhosis. The portal  vein and superior mesenteric vein are patent. No associated splenomegaly. Electronically Signed   By: EMisty StanleyM.D.   On: 09/18/2015 17:29  US Venous Img Lower Bilateral  10/15/2015  CLINICAL DATA:  69 year old male with bilateral lower extremity edema. Elevated D-dimer. History of congestive heart failure and morbid obesity. EXAM: BILATERAL LOWER EXTREMITY VENOUS DOPPLER ULTRASOUND TECHNIQUE: Gray-scale sonography with graded compression, as well as color Doppler and duplex ultrasound were performed to evaluate the lower extremity deep venous systems from the level of the common femoral vein and including the common femoral, femoral, profunda femoral, popliteal and calf veins including the posterior tibial, peroneal and gastrocnemius veins when visible. The superficial great saphenous vein was also interrogated. Spectral Doppler was utilized to evaluate flow at rest and with distal augmentation maneuvers in the common femoral, femoral and popliteal veins. COMPARISON:  None. FINDINGS: RIGHT LOWER EXTREMITY Common Femoral Vein: No evidence of thrombus. Normal compressibility, respiratory phasicity and response to augmentation. Saphenofemoral Junction: No evidence of thrombus. Normal compressibility and flow on color Doppler imaging. Profunda Femoral Vein: No evidence of thrombus. Normal compressibility and flow on color Doppler imaging. Femoral Vein: Positive for thrombus (nonocclusive). Partial compressibility and response to augmentation. Popliteal Vein: No evidence of thrombus. Normal compressibility, respiratory phasicity and response to augmentation. Calf Veins: Sub optimal evaluation. Superficial Great Saphenous Vein: No evidence of thrombus. Normal compressibility and flow on color Doppler imaging. Venous Reflux:  None. Other Findings:  None. LEFT LOWER EXTREMITY Common Femoral Vein: No evidence of thrombus. Normal compressibility, respiratory phasicity and response to augmentation. Saphenofemoral  Junction: No evidence of thrombus. Normal compressibility and flow on color Doppler imaging. Profunda Femoral Vein: No evidence of thrombus. Normal compressibility and flow on color Doppler imaging. Femoral Vein: No evidence of thrombus. Normal compressibility, respiratory phasicity and response to augmentation. Popliteal Vein: No evidence of thrombus. Normal compressibility, respiratory phasicity and response to augmentation. Calf Veins: Sub optimal evaluation. Superficial Great Saphenous Vein: No evidence of thrombus. Normal compressibility and flow on color Doppler imaging. Venous Reflux:  None. Other Findings:  None. IMPRESSION: 1. Study is positive for nonocclusive deep venous thrombosis in the right femoral vein. 2. No evidence of deep venous thrombosis in the left lower extremity. 3. Limited study which was unable to accurately evaluate the calf veins bilaterally. Electronically Signed   By: Vinnie Langton M.D.   On: 10/15/2015 10:17   Dg Chest Port 1 View  10/15/2015  CLINICAL DATA:  Atrial fibrillation. Morbid obesity. Right lower extremity DVT. EXAM: PORTABLE CHEST 1 VIEW COMPARISON:  05/10/2015 FINDINGS: Left-sided power port remains in appropriate position. Heart size is stable. Both lungs are clear. No evidence of pneumothorax or pleural effusion. IMPRESSION: No active disease. Electronically Signed   By: Earle Gell M.D.   On: 10/15/2015 11:58  [4 week]   Impression: 69 y/o male with h/o Stage IIA adenocarcinoma of colon, s/p definitive surgery by Dr. Arnoldo Morale on 04/03/15. Completed 7 cycles of adjuvant FOLFOX, per patient last cycle over six weeks ago. DID NOT require XRT. He presented with worsening lower extremity edema and subsequently diagnosed with right lower extremity DVT (had bee on Eliquis for 7 day for recent colonoscopies). Suspected third spacing due to diastolic CHF.   Developed elevated bilirubin, alk phos, AST and thrombocytopenia in setting of chemo this year, so chemo held.  U/S suggestive of fatty liver. CT 09/18/15 with no metastatic disease. But showed mild circumferential wall thickening in distal sigmoid and rectum. Mild associated perirectal edema/inflammation. Persistent marked vascular collateralization in left abd and some nodularity to liver contour. Cannot exclude underlying chronic liver disease/cirrhosis as contributing factor to third spacing. Patient  has diuresed 25 pounds this admission.   Patient with couple month h/o fecal incontinence in setting of loose stools. Recently suspected of spurious diarrhea with poor bowel preps at time of both colonoscopies. No improvement in symptoms on Linzess. Multiple loose stools. Patient has no urge for BM but is aware after soilage. No loss of bladder control. No abnormalities on recent MRI brain to explain. Wife reports no solid stools in months.    Plan: 1. Stop Linzess. 2. Stool studies to rule out infectious etiologies.  3. DRE tomorrow morning. Patient was in chair and unable to ambulate to bed without significant assistance. Doubt significant findings given recent colonoscopy with rectal exam. 4. Viral markers for Hep B and C.  We would like to thank you for the opportunity to participate in the care of SEAB AXEL.  Laureen Ochs. [facility] [phone] 10/18/201610:31 AM     LOS: 1 day

## 2015-10-17 NOTE — Discharge Instructions (Signed)
Information on my medicine - ELIQUIS (apixaban)  This medication education was reviewed with me or my healthcare representative as part of my discharge preparation.  Why was Eliquis prescribed for you? Eliquis was prescribed to treat blood clots that may have been found in the veins of your legs (deep vein thrombosis) or in your lungs (pulmonary embolism) and to reduce the risk of them occurring again.  What do You need to know about Eliquis ? The starting dose is 10 mg (two 5 mg tablets) taken TWICE daily for the FIRST SEVEN (7) DAYS, then on (enter date)  10/22/15  the dose is reduced to ONE 5 mg tablet taken TWICE daily.  Eliquis may be taken with or without food.   Try to take the dose about the same time in the morning and in the evening. If you have difficulty swallowing the tablet whole please discuss with your pharmacist how to take the medication safely.  Take Eliquis exactly as prescribed and DO NOT stop taking Eliquis without talking to the doctor who prescribed the medication.  Stopping may increase your risk of developing a new blood clot.  Refill your prescription before you run out.  After discharge, you should have regular check-up appointments with your healthcare provider that is prescribing your Eliquis.    What do you do if you miss a dose? If a dose of ELIQUIS is not taken at the scheduled time, take it as soon as possible on the same day and twice-daily administration should be resumed. The dose should not be doubled to make up for a missed dose.  Important Safety Information A possible side effect of Eliquis is bleeding. You should call your healthcare provider right away if you experience any of the following: ? Bleeding from an injury or your nose that does not stop. ? Unusual colored urine (red or dark brown) or unusual colored stools (red or black). ? Unusual bruising for unknown reasons. ? A serious fall or if you hit your head (even if there is no  bleeding).  Some medicines may interact with Eliquis and might increase your risk of bleeding or clotting while on Eliquis. To help avoid this, consult your healthcare provider or pharmacist prior to using any new prescription or non-prescription medications, including herbals, vitamins, non-steroidal anti-inflammatory drugs (NSAIDs) and supplements.  This website has more information on Eliquis (apixaban): http://www.eliquis.com/eliquis/home

## 2015-10-17 NOTE — Consult Note (Signed)
   [facility] Inpatient Consult   10/17/2015  Paisley [birth year] [id number]  Spoke with patient and wife,Tina at bedside regarding [facility] services. Patient would like to think about [facility] services at this time. Patient given [facility] brochure and contact information and RNCM encouraged patient to reach out to [facility] if patient decides he wants to participate with program.  Of note, [facility] services would not replace or interfere with any services that are arranged by inpatient case management or social work. For additional questions or referrals please contact: Royetta Crochet. Laymond Purser, RN, BSN, [facility] Liaison [phone]

## 2015-10-17 NOTE — Progress Notes (Signed)
Physical Therapy Treatment Patient Details Name: [name] MRN: [id number] DOB: 1946/08/16 Today's Date: 10/17/2015    History of Present Illness Pt is a 69yo white male, morbidly obese, currently undergoing chemotherapy for CA. Pt was admitted with multiple medical problems. He has a DVT in his right leg. He has marked swelling of both legs. He had gained about 20 pounds. In addition to that he reports chronic decline of balance over the past 6 weeks and has lost control of his stool. He is known to have atrial fibrillation and he was anticoagulated on Eliquis but he had been off that so he could have colonoscopy so I don't think his clot is a failure of his anticoagulant. He had echocardiogram about a month ago that showed normal left ventricular systolic function but diastolic function could not be assessed but it is felt that he probably has chronic diastolic heart failure. He says he feels a little better    PT Comments    Pt with bowel movement in bed upon therapist assistant, pt cleansed and Depends place on LE per pt. request prior gait training.  Pt independent with bed mobility following cueing for hand placement for safety.  Min assistance with transfer sit to stand with cueing for handplacement for increased ease and safety.  Pt able to ambulate 150 feet with min guard, no noted LOB episodes during session.  Standing LE strengthening complete with HHA.  End of session pt left in chair with wife in room and call bell within reach.  No reports of pain at end of session, pt was encouraged to push call bell for RN if pain returns.  Follow Up Recommendations        Equipment Recommendations       Recommendations for Other Services       Precautions / Restrictions Precautions Precautions: Fall Precaution Comments: Pt reports falling every other day and is very unsteady with ambulation.  Restrictions Weight Bearing Restrictions: No    Mobility  Bed Mobility Overal bed  mobility: Modified Independent             General bed mobility comments: cueing for handplacement  Transfers Overall transfer level: Modified independent Equipment used: Rolling walker (2 wheeled) Transfers: Sit to/from Stand Sit to Stand: Min guard         General transfer comment: Cueing for hand placement prior to sit or standing.  Ambulation/Gait Ambulation/Gait assistance: Min assist Ambulation Distance (Feet): 150 Feet Assistive device: Rolling walker (2 wheeled)     Gait velocity interpretation: Below normal speed for age/gender General Gait Details: Compensated trendelenburg on R side during gait, wide base of support, requires VC to maintian RW at safe distance.    Stairs            Wheelchair Mobility    Modified Rankin (Stroke Patients Only)       Balance                                    Cognition Arousal/Alertness: Awake/alert Behavior During Therapy: WFL for tasks assessed/performed Overall Cognitive Status: Within Functional Limits for tasks assessed                      Exercises Total Joint Exercises Ankle Circles/Pumps: AROM;Both;10 reps;Supine;Standing Heel Slides: AROM;Both;5 reps;Supine Hip ABduction/ADduction: AROM;Both;10 reps;Standing Bridges: AROM;Both;10 reps;Supine General Exercises - Lower Extremity Ankle Circles/Pumps: AROM;Both;Supine Hip ABduction/ADduction: AROM;Both;10  reps;Standing Heel Raises: AROM;Both;10 reps;Standing Mini-Sqauts: AROM;Both;10 reps;Standing    General Comments        Pertinent Vitals/Pain Pain Score: 8  (8/10 at beginning of session, 0/10 at EOS) Pain Location: Lower leg near DVT site Pain Descriptors / Indicators: Sore Pain Intervention(s): Limited activity within patient's tolerance;Monitored during session    Home Living                      Prior Function            PT Goals (current goals can now be found in the care plan section) Progress  towards PT goals: Progressing toward goals    Frequency       PT Plan Current plan remains appropriate    Co-evaluation             End of Session Equipment Utilized During Treatment: Gait belt Activity Tolerance: Patient tolerated treatment well Patient left: in chair;with call bell/phone within reach;with chair alarm set;with family/visitor present     Time: 0940-7680 PT Time Calculation (min) (ACUTE ONLY): 40 min  Charges:  $Gait Training: 8-22 mins $Therapeutic Exercise: 8-22 mins $Therapeutic Activity: 8-22 mins                    G Codes:      Aldona Lento 10/17/2015, 9:44 AM

## 2015-10-17 NOTE — Progress Notes (Signed)
Consulting cardiologist: Kate Sable MD Primary Cardiologist: Carlyle Dolly MD  Cardiology Specific Problem List: 1.Acute on Chronic Diastolic CHF 2. Atrial fibrillation CHADS VASC Score of 2  Subjective:    Feeling and breathing better. Still feels weak.   Objective:   Temp:  [98 F (36.7 C)-98.3 F (36.8 C)] 98 F (36.7 C) (10/18 0618) Pulse Rate:  [79-81] 81 (10/18 0618) Resp:  [18-20] 18 (10/18 0618) BP: (105-108)/(64-67) 106/67 mmHg (10/18 0618) SpO2:  [96 %-100 %] 96 % (10/18 0618) Weight:  [242 lb (109.77 kg)] 242 lb (109.77 kg) (10/18 0618) Last BM Date: 10/16/15  Filed Weights   10/15/15 1325 10/16/15 0700 10/17/15 0618  Weight: 261 lb 9.6 oz (118.661 kg) 256 lb 4.8 oz (116.257 kg) 242 lb (109.77 kg)    Intake/Output Summary (Last 24 hours) at 10/17/15 0958 Last data filed at 10/17/15 0800  Gross per 24 hour  Intake    720 ml  Output   4900 ml  Net  -4180 ml    Telemetry: Atrial fib   Exam:  General: No acute distress.  HEENT: Conjunctiva and lids normal, oropharynx clear.  Lungs: Clear to auscultation, nonlabored.  Cardiac: No elevated JVP or bruits. RRR, no gallop or rub.   Abdomen: Normoactive bowel sounds, nontender, nondistended.  Extremities: No pitting edema, distal pulses full.  Neuropsychiatric: Alert and oriented x3, affect appropriate.   Lab Results:  Basic Metabolic Panel:  Recent Labs Lab 10/14/15 1334 10/16/15 0640 10/17/15 0639  NA 136 136 139  K 4.6 3.5 3.6  CL 107 100* 101  CO2 27 28 32  GLUCOSE 102* 113* 99  BUN 7 7 9   CREATININE 0.75 0.81 0.97  CALCIUM 8.7* 8.3* 8.4*    Liver Function Tests:  Recent Labs Lab 10/14/15 1334  AST 71*  ALT 36  ALKPHOS 133*  BILITOT 2.1*  PROT 6.1*  ALBUMIN 2.2*    CBC:  Recent Labs Lab 10/14/15 1334  WBC 3.4*  HGB 11.3*  HCT 33.5*  MCV 103.1*  PLT 83*    Radiology: Mr Cervical Spine Wo Contrast  10/16/2015  CLINICAL DATA:  Neck pain with  bilateral arm numbness. History of colon cancer. Initial encounter. EXAM: MRI CERVICAL SPINE WITHOUT CONTRAST TECHNIQUE: Multiplanar, multisequence MR imaging of the cervical spine was performed. No intravenous contrast was administered. COMPARISON:  Cervical spine CT 05/22/2013. FINDINGS: The alignment is stable with a mild scoliosis. No evidence of acute fracture or paraspinal abnormality. No suspicious marrow lesions. The craniocervical junction appears normal. The cervical cord is normal in signal and caliber. There are bilateral vertebral artery flow voids. There is stable tracheal deviation the right at the thoracic inlet. C2-3:  Normal interspace. C3-4: Stable uncinate spurring bilaterally, contributing to mild narrowing of both foramina. No cord deformity. C4-5: There is moderate spondylosis with posterior osteophytes covering diffusely bulging disc material. There is an asymmetric disc osteophyte complex on the left which contributes to left-sided cord flattening and asymmetric narrowing of the left foramen. There is probable resulting left C5 nerve root encroachment. The right foramen appears only mildly narrowed. C5-6: Chronic spondylosis with posterior osteophytes covering diffusely bulging disc material. The CSF surrounding the cord is partially effaced with narrowing of the AP diameter of the canal to 8 mm. Uncinate spurring contributes to mild to moderate foraminal narrowing bilaterally. C6-7: Small central disc protrusion. No cord deformity or foraminal compromise. C7-T1:  Normal interspace. IMPRESSION: 1. Asymmetric cord flattening on the left at C4-5 secondary to  spondylosis and a superimposed left foraminal disc osteophyte complex. Probable associated left C5 nerve root encroachment. 2. Spondylosis at C5-6 contributes to mild-to-moderate spinal stenosis and biforaminal narrowing. 3. Mild uncinate spurring at C3-4 with resulting mild biforaminal narrowing. 4. No evidence of metastatic disease.  Electronically Signed   By: Richardean Sale M.D.   On: 10/16/2015 10:12   US Venous Img Lower Bilateral  10/15/2015  CLINICAL DATA:  69 year old male with bilateral lower extremity edema. Elevated D-dimer. History of congestive heart failure and morbid obesity. EXAM: BILATERAL LOWER EXTREMITY VENOUS DOPPLER ULTRASOUND TECHNIQUE: Gray-scale sonography with graded compression, as well as color Doppler and duplex ultrasound were performed to evaluate the lower extremity deep venous systems from the level of the common femoral vein and including the common femoral, femoral, profunda femoral, popliteal and calf veins including the posterior tibial, peroneal and gastrocnemius veins when visible. The superficial great saphenous vein was also interrogated. Spectral Doppler was utilized to evaluate flow at rest and with distal augmentation maneuvers in the common femoral, femoral and popliteal veins. COMPARISON:  None. FINDINGS: RIGHT LOWER EXTREMITY Common Femoral Vein: No evidence of thrombus. Normal compressibility, respiratory phasicity and response to augmentation. Saphenofemoral Junction: No evidence of thrombus. Normal compressibility and flow on color Doppler imaging. Profunda Femoral Vein: No evidence of thrombus. Normal compressibility and flow on color Doppler imaging. Femoral Vein: Positive for thrombus (nonocclusive). Partial compressibility and response to augmentation. Popliteal Vein: No evidence of thrombus. Normal compressibility, respiratory phasicity and response to augmentation. Calf Veins: Sub optimal evaluation. Superficial Great Saphenous Vein: No evidence of thrombus. Normal compressibility and flow on color Doppler imaging. Venous Reflux:  None. Other Findings:  None. LEFT LOWER EXTREMITY Common Femoral Vein: No evidence of thrombus. Normal compressibility, respiratory phasicity and response to augmentation. Saphenofemoral Junction: No evidence of thrombus. Normal compressibility and flow on  color Doppler imaging. Profunda Femoral Vein: No evidence of thrombus. Normal compressibility and flow on color Doppler imaging. Femoral Vein: No evidence of thrombus. Normal compressibility, respiratory phasicity and response to augmentation. Popliteal Vein: No evidence of thrombus. Normal compressibility, respiratory phasicity and response to augmentation. Calf Veins: Sub optimal evaluation. Superficial Great Saphenous Vein: No evidence of thrombus. Normal compressibility and flow on color Doppler imaging. Venous Reflux:  None. Other Findings:  None. IMPRESSION: 1. Study is positive for nonocclusive deep venous thrombosis in the right femoral vein. 2. No evidence of deep venous thrombosis in the left lower extremity. 3. Limited study which was unable to accurately evaluate the calf veins bilaterally. Electronically Signed   By: Vinnie Langton M.D.   On: 10/15/2015 10:17   Dg Chest Port 1 View  10/15/2015  CLINICAL DATA:  Atrial fibrillation. Morbid obesity. Right lower extremity DVT. EXAM: PORTABLE CHEST 1 VIEW COMPARISON:  05/10/2015 FINDINGS: Left-sided power port remains in appropriate position. Heart size is stable. Both lungs are clear. No evidence of pneumothorax or pleural effusion. IMPRESSION: No active disease. Electronically Signed   By: Earle Gell M.D.   On: 10/15/2015 11:58      Medications:   Scheduled Medications: . apixaban  10 mg Oral BID  . ciprofloxacin  1 drop Left Eye Q4H while awake  . escitalopram  20 mg Oral Daily  . furosemide  40 mg Intravenous Q12H  . Linaclotide  145 mcg Oral Daily  . lisinopril  10 mg Oral Daily  . methylPREDNISolone (SOLU-MEDROL) injection  1,000 mg Intravenous Q24H  . pantoprazole  40 mg Oral Daily  . potassium chloride SA  40 mEq Oral Daily  . psyllium  1 packet Oral Daily  . sodium chloride  3 mL Intravenous Q12H    Infusions:    PRN Medications: sodium chloride, acetaminophen, ondansetron (ZOFRAN) IV, sodium chloride   Assessment  and Plan:   1. Atrial fib: Rate is controlled No rate control medications. Back on NOAC with higher dose for 7 days in the setting of DVT off Eliquis for 2 weeks for colonoscopy.    2. Acute on Chronic Diastolic CHF: He has diuresed 9.5 liters so far. Creatinine 0.97. CO 2 32. Continues on IV lasix 40 mg BID. Dry wt is around 250 lbs he is at 242, but continues to have 2+ edema in the LEE bilaterally. Will continue IV lasix and watch kidney fx. He may need lower dry wt.   3. Colon CA: Followed by oncology.   Phill Myron. Lawrence NP [place]  10/17/2015, 9:58 AM   The patient was seen and examined, and I agree with the assessment and plan as documented above, with modifications as noted below. Pt says leg swelling has improved. Continue Eliquis for acute right femoral DVT. Continue IV Lasix at present dose given adequate response.

## 2015-10-18 DIAGNOSIS — I482 Chronic atrial fibrillation: Secondary | ICD-10-CM

## 2015-10-18 DIAGNOSIS — I82A19 Acute embolism and thrombosis of unspecified axillary vein: Secondary | ICD-10-CM

## 2015-10-18 DIAGNOSIS — R609 Edema, unspecified: Secondary | ICD-10-CM

## 2015-10-18 LAB — BASIC METABOLIC PANEL
ANION GAP: 6 (ref 5–15)
BUN: 12 mg/dL (ref 6–20)
CALCIUM: 8.6 mg/dL — AB (ref 8.9–10.3)
CHLORIDE: 101 mmol/L (ref 101–111)
CO2: 30 mmol/L (ref 22–32)
CREATININE: 0.82 mg/dL (ref 0.61–1.24)
GFR calc Af Amer: >60 mL/min (ref >60)
GFR calc non Af Amer: >60 mL/min (ref >60)
GLUCOSE: 179 mg/dL — AB (ref 65–99)
Potassium: 3.6 mmol/L (ref 3.5–5.1)
Sodium: 137 mmol/L (ref 135–145)

## 2015-10-18 LAB — CBC
HCT: 37.1 % — ABNORMAL LOW (ref 39.0–52.0)
Hemoglobin: 12.8 g/dL — ABNORMAL LOW (ref 13.0–17.0)
MCH: 34.5 pg — AB (ref 26.0–34.0)
MCHC: 34.5 g/dL (ref 30.0–36.0)
MCV: 100 fL (ref 78.0–100.0)
PLATELETS: 124 10*3/uL — AB (ref 150–400)
RBC: 3.71 MIL/uL — ABNORMAL LOW (ref 4.22–5.81)
RDW: 13.7 % (ref 11.5–15.5)
WBC: 11 10*3/uL — ABNORMAL HIGH (ref 4.0–10.5)

## 2015-10-18 LAB — HEPATITIS B SURFACE ANTIGEN: HEP B S AG: NEGATIVE

## 2015-10-18 LAB — C DIFFICILE QUICK SCREEN W PCR REFLEX
C DIFFICLE (CDIFF) ANTIGEN: NEGATIVE
C Diff interpretation: NEGATIVE
C Diff toxin: NEGATIVE

## 2015-10-18 LAB — RPR: RPR: NONREACTIVE

## 2015-10-18 LAB — HEPATITIS C ANTIBODY: HCV Ab: <0.1 s/co ratio (ref 0.0–0.9)

## 2015-10-18 LAB — HOMOCYSTEINE: HOMOCYSTEINE-NORM: 12.6 umol/L (ref 0.0–15.0)

## 2015-10-18 MED ORDER — FUROSEMIDE 40 MG PO TABS
40.0000 mg | ORAL_TABLET | Freq: Two times a day (BID) | ORAL | Status: DC
  Administered 2015-10-18 (×2): 40 mg via ORAL
  Filled 2015-10-18: qty 1

## 2015-10-18 NOTE — Progress Notes (Signed)
Subjective: He says he feels better. He has less edema. He was started on high-dose steroids last night to see if it makes a difference with his weakness and balance problems. He did better with a rolling walker yesterday. He has diuresed significantly over 13 L including 3 L yesterday but his weight is listed as being up today.  Objective: Vital signs in last 24 hours: Temp:  [98.3 F (36.8 C)-98.8 F (37.1 C)] 98.6 F (37 C) (10/19 0500) Pulse Rate:  [74-83] 83 (10/19 0500) Resp:  [18] 18 (10/19 0500) BP: (114-118)/(64-73) 118/73 mmHg (10/19 0500) SpO2:  [98 %-100 %] 99 % (10/19 0500) Weight:  [111.1 kg (244 lb 14.9 oz)] 111.1 kg (244 lb 14.9 oz) (10/19 0500) Weight change: 1.33 kg (2 lb 14.9 oz) Last BM Date: 10/16/15  Intake/Output from previous day: 10/18 0701 - 10/19 0700 In: 720 [P.O.:720] Out: 3925 [Urine:3925]  PHYSICAL EXAM General appearance: alert, cooperative, no distress and morbidly obese Resp: clear to auscultation bilaterally Cardio: irregularly irregular rhythm GI: soft, non-tender; bowel sounds normal; no masses,  no organomegaly Extremities: He still has edema but it is much improved.  Lab Results:  Results for orders placed or performed during the hospital encounter of 10/15/15 (from the past 48 hour(s))  Basic metabolic panel     Status: Abnormal   Collection Time: 10/17/15  6:39 AM  Result Value Ref Range   Sodium 139 135 - 145 mmol/L   Potassium 3.6 3.5 - 5.1 mmol/L   Chloride 101 101 - 111 mmol/L   CO2 32 22 - 32 mmol/L   Glucose, Bld 99 65 - 99 mg/dL   BUN 9 6 - 20 mg/dL   Creatinine, Ser 0.97 0.61 - 1.24 mg/dL   Calcium 8.4 (L) 8.9 - 10.3 mg/dL   GFR calc non Af Amer >60 >60 mL/min   GFR calc Af Amer >60 >60 mL/min    Comment: (NOTE) The eGFR has been calculated using the CKD EPI equation. This calculation has not been validated in all clinical situations. eGFR's persistently <60 mL/min signify possible Chronic Kidney Disease.    Anion gap  6 5 - 15  TSH     Status: None   Collection Time: 10/17/15 10:27 AM  Result Value Ref Range   TSH 3.085 0.350 - 4.500 uIU/mL  Vitamin B12     Status: None   Collection Time: 10/17/15 10:28 AM  Result Value Ref Range   Vitamin B-12 786 180 - 914 pg/mL    Comment: (NOTE) This assay is not validated for testing neonatal or myeloproliferative syndrome specimens for Vitamin B12 levels. Performed at [facility]   RPR     Status: None   Collection Time: 10/17/15 10:28 AM  Result Value Ref Range   RPR Ser Ql Non Reactive Non Reactive    Comment: (NOTE) Performed At: [facility] [place], [place] [id number] Lindon Romp MD QM:[id number]   C difficile quick scan w PCR reflex     Status: None   Collection Time: 10/17/15 10:55 PM  Result Value Ref Range   C Diff antigen NEGATIVE NEGATIVE   C Diff toxin NEGATIVE NEGATIVE   C Diff interpretation Negative for toxigenic C. difficile   Basic metabolic panel     Status: Abnormal   Collection Time: 10/18/15  6:41 AM  Result Value Ref Range   Sodium 137 135 - 145 mmol/L   Potassium 3.6 3.5 - 5.1 mmol/L   Chloride 101  101 - 111 mmol/L   CO2 30 22 - 32 mmol/L   Glucose, Bld 179 (H) 65 - 99 mg/dL   BUN 12 6 - 20 mg/dL   Creatinine, Ser 0.82 0.61 - 1.24 mg/dL   Calcium 8.6 (L) 8.9 - 10.3 mg/dL   GFR calc non Af Amer >60 >60 mL/min   GFR calc Af Amer >60 >60 mL/min    Comment: (NOTE) The eGFR has been calculated using the CKD EPI equation. This calculation has not been validated in all clinical situations. eGFR's persistently <60 mL/min signify possible Chronic Kidney Disease.    Anion gap 6 5 - 15  CBC     Status: Abnormal   Collection Time: 10/18/15  6:41 AM  Result Value Ref Range   WBC 11.0 (H) 4.0 - 10.5 K/uL   RBC 3.71 (L) 4.22 - 5.81 MIL/uL   Hemoglobin 12.8 (L) 13.0 - 17.0 g/dL   HCT 37.1 (L) 39.0 - 52.0 %   MCV 100.0 78.0 - 100.0 fL   MCH 34.5 (H) 26.0 - 34.0 pg   MCHC 34.5 30.0 - 36.0 g/dL    RDW 13.7 11.5 - 15.5 %   Platelets 124 (L) 150 - 400 K/uL    ABGS No results for input(s): PHART, PO2ART, TCO2, HCO3 in the last 72 hours.  Invalid input(s): PCO2 CULTURES Recent Results (from the past 240 hour(s))  C difficile quick scan w PCR reflex     Status: None   Collection Time: 10/17/15 10:55 PM  Result Value Ref Range Status   C Diff antigen NEGATIVE NEGATIVE Final   C Diff toxin NEGATIVE NEGATIVE Final   C Diff interpretation Negative for toxigenic C. difficile  Final   Studies/Results: Mr Cervical Spine Wo Contrast  10/16/2015  CLINICAL DATA:  Neck pain with bilateral arm numbness. History of colon cancer. Initial encounter. EXAM: MRI CERVICAL SPINE WITHOUT CONTRAST TECHNIQUE: Multiplanar, multisequence MR imaging of the cervical spine was performed. No intravenous contrast was administered. COMPARISON:  Cervical spine CT 05/22/2013. FINDINGS: The alignment is stable with a mild scoliosis. No evidence of acute fracture or paraspinal abnormality. No suspicious marrow lesions. The craniocervical junction appears normal. The cervical cord is normal in signal and caliber. There are bilateral vertebral artery flow voids. There is stable tracheal deviation the right at the thoracic inlet. C2-3:  Normal interspace. C3-4: Stable uncinate spurring bilaterally, contributing to mild narrowing of both foramina. No cord deformity. C4-5: There is moderate spondylosis with posterior osteophytes covering diffusely bulging disc material. There is an asymmetric disc osteophyte complex on the left which contributes to left-sided cord flattening and asymmetric narrowing of the left foramen. There is probable resulting left C5 nerve root encroachment. The right foramen appears only mildly narrowed. C5-6: Chronic spondylosis with posterior osteophytes covering diffusely bulging disc material. The CSF surrounding the cord is partially effaced with narrowing of the AP diameter of the canal to 8 mm.  Uncinate spurring contributes to mild to moderate foraminal narrowing bilaterally. C6-7: Small central disc protrusion. No cord deformity or foraminal compromise. C7-T1:  Normal interspace. IMPRESSION: 1. Asymmetric cord flattening on the left at C4-5 secondary to spondylosis and a superimposed left foraminal disc osteophyte complex. Probable associated left C5 nerve root encroachment. 2. Spondylosis at C5-6 contributes to mild-to-moderate spinal stenosis and biforaminal narrowing. 3. Mild uncinate spurring at C3-4 with resulting mild biforaminal narrowing. 4. No evidence of metastatic disease. Electronically Signed   By: Richardean Sale M.D.   On: 10/16/2015 10:12  Medications:  Prior to Admission:  Prescriptions prior to admission  Medication Sig Dispense Refill Last Dose  . ciprofloxacin (CILOXAN) 0.3 % ophthalmic solution Place 1 drop into the left eye every 4 (four) hours while awake.   0 10/14/2015 at Unknown time  . ELIQUIS 5 MG TABS tablet TAKE 1 TABLET BY MOUTH TWICE A DAY  12 10/14/2015 at 730  . escitalopram (LEXAPRO) 20 MG tablet Take 1 tablet (20 mg total) by mouth daily. 30 tablet 2 10/14/2015 at Unknown time  . furosemide (LASIX) 20 MG tablet Take 1 tablet (20 mg total) by mouth daily. 90 tablet 3 10/14/2015 at Unknown time  . Linaclotide (LINZESS) 145 MCG CAPS capsule 1 PO 30 mins prior to your first meal (Patient taking differently: Take 145 mcg by mouth daily. 1 PO 30 mins prior to your first meal) 30 capsule 11 10/14/2015 at Unknown time  . lisinopril (PRINIVIL,ZESTRIL) 10 MG tablet Take 1 tablet (10 mg total) by mouth daily. 30 tablet 12 10/14/2015 at Unknown time  . omeprazole (PRILOSEC) 20 MG capsule Take 1 capsule (20 mg total) by mouth daily. 30 capsule 3 10/14/2015 at Unknown time  . potassium chloride SA (K-DUR,KLOR-CON) 20 MEQ tablet Take 1 tablet (20 mEq total) by mouth daily. 90 tablet 3 10/14/2015 at Unknown time  . psyllium (METAMUCIL) 58.6 % packet Take 1 packet by  mouth daily.   10/14/2015 at Unknown time  . lidocaine-prilocaine (EMLA) cream Apply a quarter size amount to port site 1 hour prior to chemo. Do not rub in. Cover with plastic wrap. 30 g 3 10/12/2015  . nitroGLYCERIN (NITROGLYN) 2 % ointment Apply 0.5 inches topically as needed for chest pain. For rectal area   unknown   Scheduled: . apixaban  10 mg Oral BID  . [START ON 10/22/2015] apixaban  5 mg Oral BID  . ciprofloxacin  1 drop Left Eye Q4H while awake  . escitalopram  20 mg Oral Daily  . furosemide  40 mg Intravenous Q12H  . lisinopril  10 mg Oral Daily  . methylPREDNISolone (SOLU-MEDROL) injection  1,000 mg Intravenous Q24H  . pantoprazole  40 mg Oral Daily  . potassium chloride SA  40 mEq Oral Daily  . psyllium  1 packet Oral Daily  . sodium chloride  3 mL Intravenous Q12H   Continuous:  UTM:LYYTKP chloride, acetaminophen, ondansetron (ZOFRAN) IV, sodium chloride  Assesment: He was admitted with acute diastolic heart failure. This is in the setting of a DVT in his right leg. His DVT occurred while he was off his novel anticoagulant for colonoscopy. He is on high-dose Eliquis for this. His heart failure is much better and he has diuresed 13 L  He has morbid obesity which is unchanged.  He has colon cancer which I think probably makes him hypercoagulable anyway.  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 Continue  PT. I think we can probably  back off on his diuresis today but I will leave that decision to cardiology. Continue other treatments.    LOS: 2 days   Etoy Mcdonnell L 10/18/2015, 8:12 AM

## 2015-10-18 NOTE — Progress Notes (Signed)
Subjective:  Did okay during the day yesterday but 4 BMs since 10pm last night. Couple of large, soft stools. Appetite good.   Objective: Vital signs in last 24 hours: Temp:  [98.3 F (36.8 C)-98.8 F (37.1 C)] 98.6 F (37 C) (10/19 0500) Pulse Rate:  [74-83] 83 (10/19 0500) Resp:  [18] 18 (10/19 0500) BP: (114-118)/(64-73) 118/73 mmHg (10/19 0500) SpO2:  [98 %-100 %] 99 % (10/19 0500) Weight:  [244 lb 14.9 oz (111.1 kg)] 244 lb 14.9 oz (111.1 kg) (10/19 0500) Last BM Date: 10/16/15 General:   Alert,  Well-developed, well-nourished, pleasant and cooperative in NAD Head:  Normocephalic and atraumatic. Eyes:  Sclera clear, no icterus.  Abdomen:  Soft, nontender and nondistended.   Normal bowel sounds, without guarding, and without rebound.   Rectal: small skin tag noted protruding from anal canal. Liquid stool noted externally around anus. Perianal rash noted. No mass in rectal vault. Rectal vault appeared dilated. No stool present. Gross blood noted on glove and with wiping. Initially voluntary squeeze appeared weak but improved with additional attempts.  Neurologic:  Alert and  oriented x4;  grossly normal neurologically. Skin:  Intact without significant lesions or rashes. Psych:  Alert and cooperative. Normal mood and affect.  Intake/Output from previous day: 10/18 0701 - 10/19 0700 In: 720 [P.O.:720] Out: 3925 [Urine:3925] Intake/Output this shift:    Lab Results: CBC  Recent Labs  10/18/15 0641  WBC 11.0*  HGB 12.8*  HCT 37.1*  MCV 100.0  PLT 124*   BMET  Recent Labs  10/16/15 0640 10/17/15 0639 10/18/15 0641  NA 136 139 137  K 3.5 3.6 3.6  CL 100* 101 101  CO2 28 32 30  GLUCOSE 113* 99 179*  BUN 7 9 12   CREATININE 0.81 0.97 0.82  CALCIUM 8.3* 8.4* 8.6*   LFTs No results for input(s): BILITOT, BILIDIR, IBILI, ALKPHOS, AST, ALT, PROT, ALBUMIN in the last 72 hours. No results for input(s): LIPASE in the last 72 hours. PT/INR No results for input(s):  LABPROT, INR in the last 72 hours.    Imaging Studies: Peter Jeri Cos Wo Beasley  07-Oct-2015  CLINICAL DATA:  Weakness and syncope for 2 months. Dizziness. Personal history of colon cancer. EXAM: MRI HEAD WITHOUT AND WITH Beasley TECHNIQUE: Multiplanar, multiecho pulse sequences of the brain and surrounding structures were obtained without and with intravenous Beasley. Beasley:  34m MULTIHANCE GADOBENATE DIMEGLUMINE 529 MG/ML IV SOLN COMPARISON:  CT head without Beasley 05/22/2013 FINDINGS: Mild atrophy is within normal limits for age. No acute infarct, hemorrhage, or mass lesion is present. The ventricles are of normal size. No significant extraaxial fluid collection is present. No significant white matter disease is present. The internal auditory canals and brainstem are normal. Flow is present in the major intracranial arteries. The globes and orbits are intact. Of mucosal thickening is present along the floor of the maxillary sinuses bilaterally. The remaining paranasal sinuses are clear. The skullbase is within normal limits. Midline structures are unremarkable. There is some motion degradation of the study, particularly on the postcontrast images. No pathologic enhancement is present to suggest metastatic disease to the brain or meninges. IMPRESSION: 1. Normal MRI appearance of the brain for age. 2. Minimal maxillary sinus disease. Electronically Signed   By: CSan MorelleM.D.   On: [id number]:11   [name]  10/16/2015  CLINICAL DATA:  Neck pain with bilateral arm numbness. History of colon cancer. Initial encounter. EXAM: MRI CERVICAL SPINE WITHOUT  Beasley TECHNIQUE: Multiplanar, multisequence Peter imaging of the cervical spine was performed. No intravenous Beasley was administered. COMPARISON:  Cervical spine CT 05/22/2013. FINDINGS: The alignment is stable with a mild scoliosis. No evidence of acute fracture or paraspinal abnormality. No suspicious marrow lesions.  The craniocervical junction appears normal. The cervical cord is normal in signal and caliber. There are bilateral vertebral artery flow voids. There is stable tracheal deviation the right at the thoracic inlet. C2-3:  Normal interspace. C3-4: Stable uncinate spurring bilaterally, contributing to mild narrowing of both foramina. No cord deformity. C4-5: There is moderate spondylosis with posterior osteophytes covering diffusely bulging disc material. There is an asymmetric disc osteophyte complex on the left which contributes to left-sided cord flattening and asymmetric narrowing of the left foramen. There is probable resulting left C5 nerve root encroachment. The right foramen appears only mildly narrowed. C5-6: Chronic spondylosis with posterior osteophytes covering diffusely bulging disc material. The CSF surrounding the cord is partially effaced with narrowing of the AP diameter of the canal to 8 mm. Uncinate spurring contributes to mild to moderate foraminal narrowing bilaterally. C6-7: Small central disc protrusion. No cord deformity or foraminal compromise. C7-T1:  Normal interspace. IMPRESSION: 1. Asymmetric cord flattening on the left at C4-5 secondary to spondylosis and a superimposed left foraminal disc osteophyte complex. Probable associated left C5 nerve root encroachment. 2. Spondylosis at C5-6 contributes to mild-to-moderate spinal stenosis and biforaminal narrowing. 3. Mild uncinate spurring at C3-4 with resulting mild biforaminal narrowing. 4. No evidence of metastatic disease. Electronically Signed   By: Richardean Sale M.D.   On: 10/16/2015 10:12   Ct Abdomen Pelvis W Beasley  09/18/2015  CLINICAL DATA:  Subsequent encounter for colon cancer with elevated bilirubin EXAM: CT ABDOMEN AND PELVIS WITH Beasley TECHNIQUE: Multidetector CT imaging of the abdomen and pelvis was performed using the standard protocol following bolus administration of intravenous Beasley. Beasley:  111m OMNIPAQUE  IOHEXOL 300 MG/ML  SOLN COMPARISON:  03/29/2015. FINDINGS: Lower chest:  Unremarkable. Hepatobiliary: No focal abnormality within the liver parenchyma. The liver shows diffusely decreased attenuation suggesting steatosis. There is no evidence for gallstones, gallbladder wall thickening, or pericholecystic fluid. No intrahepatic or extrahepatic biliary dilation. Pancreas: No focal mass lesion. No dilatation of the main duct. No intraparenchymal cyst. No peripancreatic edema. Spleen: No splenomegaly. No focal mass lesion. Adrenals/Urinary Tract: 15 mm left adrenal nodule, not substantially changed in the interval. This nodule has very low attenuation and is likely a benign adrenal adenoma. The right adrenal gland is normal in appearance. No enhancing lesion in either kidney. No hydronephrosis. No evidence for hydroureter. The urinary bladder appears normal for the degree of distention. Stomach/Bowel: Stomach is nondistended. No gastric wall thickening. No evidence of outlet obstruction. Duodenum diverticulum noted. No small bowel wall thickening. No small bowel dilatation. The terminal ileum is normal. The appendix is normal. Surgical suture line noted in the left colon in the region of the proximal sigmoid segment. Circumferential wall thickening is seen to a mild degree in the distal sigmoid colon and rectum. There is mild associated perirectal edema/ inflammation. Vascular/Lymphatic: No abdominal aortic aneurysm. There is marked vascular collateralization in the left abdomen as before. The portal vein and superior mesenteric vein are patent. Splenic vein is patent. There is no gastrohepatic or hepatoduodenal ligament lymphadenopathy. No intraperitoneal or retroperitoneal lymphadenopy. No pelvic sidewall lymphadenopathy. Reproductive: The prostate gland and seminal vesicles have normal imaging features. Other: No intraperitoneal free fluid. Musculoskeletal: Small right inguinal hernia contains only fat. 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 Bone windows reveal no worrisome lytic or sclerotic osseous lesions. IMPRESSION: 1. Interval development of mild circumferential wall thickening in the rectosigmoid junction and rectum with mild perirectal edema/inflammation. Infectious or inflammatory colitis could have this appearance. Radiation change could also have these features on CT. 2. Interval resection of proximal sigmoid colon mass. 3. No CT findings to suggest metastatic disease in the abdomen or pelvis. 4. Stable left adrenal nodule, most likely adenoma. 5. Persistent marked vascular collateralization in the left abdomen. There is some nodularity to the liver contour, but imaging features are not definitive for cirrhosis. The portal vein and superior mesenteric vein are patent. No associated splenomegaly. Electronically Signed   By: Misty Stanley M.D.   On: 09/18/2015 17:29   US Venous Img Lower Bilateral  10/15/2015  CLINICAL DATA:  69 year old male with bilateral lower extremity edema. Elevated D-dimer. History of congestive heart failure and morbid obesity. EXAM: BILATERAL LOWER EXTREMITY VENOUS DOPPLER ULTRASOUND TECHNIQUE: Gray-scale sonography with graded compression, as well as color Doppler and duplex ultrasound were performed to evaluate the lower extremity deep venous systems from the level of the common femoral vein and including the common femoral, femoral, profunda femoral, popliteal and calf veins including the posterior tibial, peroneal and gastrocnemius veins when visible. The superficial great saphenous vein was also interrogated. Spectral Doppler was utilized to evaluate flow at rest and with distal augmentation maneuvers in the common femoral, femoral and popliteal veins. COMPARISON:  None. FINDINGS: RIGHT LOWER EXTREMITY Common Femoral Vein: No evidence of thrombus. Normal compressibility, respiratory phasicity and response to augmentation. Saphenofemoral Junction: No  evidence of thrombus. Normal compressibility and flow on color Doppler imaging. Profunda Femoral Vein: No evidence of thrombus. Normal compressibility and flow on color Doppler imaging. Femoral Vein: Positive for thrombus (nonocclusive). Partial compressibility and response to augmentation. Popliteal Vein: No evidence of thrombus. Normal compressibility, respiratory phasicity and response to augmentation. Calf Veins: Sub optimal evaluation. Superficial Great Saphenous Vein: No evidence of thrombus. Normal compressibility and flow on color Doppler imaging. Venous Reflux:  None. Other Findings:  None. LEFT LOWER EXTREMITY Common Femoral Vein: No evidence of thrombus. Normal compressibility, respiratory phasicity and response to augmentation. Saphenofemoral Junction: No evidence of thrombus. Normal compressibility and flow on color Doppler imaging. Profunda Femoral Vein: No evidence of thrombus. Normal compressibility and flow on color Doppler imaging. Femoral Vein: No evidence of thrombus. Normal compressibility, respiratory phasicity and response to augmentation. Popliteal Vein: No evidence of thrombus. Normal compressibility, respiratory phasicity and response to augmentation. Calf Veins: Sub optimal evaluation. Superficial Great Saphenous Vein: No evidence of thrombus. Normal compressibility and flow on color Doppler imaging. Venous Reflux:  None. Other Findings:  None. IMPRESSION: 1. Study is positive for nonocclusive deep venous thrombosis in the right femoral vein. 2. No evidence of deep venous thrombosis in the left lower extremity. 3. Limited study which was unable to accurately evaluate the calf veins bilaterally. Electronically Signed   By: Vinnie Langton M.D.   On: 10/15/2015 10:17   Dg Chest Port 1 View  10/15/2015  CLINICAL DATA:  Atrial fibrillation. Morbid obesity. Right lower extremity DVT. EXAM: PORTABLE CHEST 1 VIEW COMPARISON:  05/10/2015 FINDINGS: Left-sided power port remains in appropriate  position. Heart size is stable. Both lungs are clear. No evidence of pneumothorax or pleural effusion. IMPRESSION: No active disease. Electronically Signed   By: Earle Gell M.D.   On: 10/15/2015 11:58  [2 weeks]   Assessment: 69 y/o  male with h/o Stage IIA adenocarcinoma of colon, s/p definitive surgery by Dr. Arnoldo Morale on 04/03/15. Completed 7 cycles of adjuvant FOLFOX, per patient last cycle over six weeks ago. DID NOT require XRT. He presented with worsening lower extremity edema and subsequently diagnosed with right lower extremity DVT (had bee on Eliquis for 7 day for recent colonoscopies). Suspected third spacing due to diastolic CHF.   Developed elevated bilirubin, alk phos, AST and thrombocytopenia in setting of chemo this year, so chemo held. U/S suggestive of fatty liver. CT 09/18/15 with no metastatic disease. But showed mild circumferential wall thickening in distal sigmoid and rectum. Mild associated perirectal edema/inflammation. Persistent marked vascular collateralization in left abd and some nodularity to liver contour. Cannot exclude underlying chronic liver disease/cirrhosis as contributing factor to third spacing. Patient has diuresed 25 pounds this admission.   Patient with couple month h/o fecal incontinence in setting of loose stools. Recently suspected of spurious diarrhea with poor bowel preps at time of both colonoscopies. No improvement in symptoms on Linzess. Multiple loose stools. Patient has no urge for BM but is aware after soilage. No loss of bladder control. No abnormalities on recent MRI brain to explain. Wife reports no solid stools in months.   DRE today with brbpr with known anal fissures and verrucous anal lesion on recent TCS. No stool in rectal vault.    Plan: 1. Cdiff neg. F/u pending GI pathogen panel.  2. Continue to monitor for any improvement from withholding Linzess over the next 24 hours.   Laureen Ochs. Bernarda Caffey [facility] [phone] 10/19/20169:27 AM     LOS: 2 days

## 2015-10-18 NOTE — Progress Notes (Signed)
[name], male   DOB: 11/06/1946, 69 y.o.   MRN: [id number]  Coweta A. [name], MD     [email or web address]          [name] is an 69 y.o. male.     ASSESSMENT/PLAN: 1. Multifactorial gait impairment including osteoarthritis and the radiographic evidence of cord encroachment. No other neurological causes are uncovered. As noted Overtly draumatic evidence of myelopathy although he does have the MRI findings of the cervical spine. There is no evidence of neuropathy or parkinsonism. 2. Chronic atrial fibrillation on chronic anticoagulation. 3. Congestive heart failure.  RECOMMENDATION: Trial of high-dose steroids for 2-3 days. Solu-Medrol will be started. Additional labs. Physical and occupational therapy.    Doing well. The patient ambulated with assistance today.  GENERAL: Pleasant obese man in no acute distress.  HEENT: Supple. Atraumatic normocephalic.   ABDOMEN: soft  EXTREMITIES: One plus pitting edema of the distal legs and ankles. Marked arthritic changes of the knees.   BACK: Normal.  SKIN: Normal by inspection.   MENTAL STATUS: Alert and oriented. Speech, language and cognition are generally intact. Judgment and insight normal.   CRANIAL NERVES: Pupils are equal, round and reactive to light and accommodation; extra ocular movements are full, there is no significant nystagmus; visual fields are full; upper and lower facial muscles are normal in strength and symmetric, there is no flattening of the nasolabial folds; tongue is midline; uvula is midline; shoulder elevation is normal.  MOTOR: Normal tone, bulk and strength; no pronator drift.  COORDINATION: Left finger to nose is normal, right finger to nose is normal, No rest tremor; no intention tremor; no postural tremor; no bradykinesia.  REFLEXES: Deep tendon reflexes are symmetrical and normal.   The cervical spine MRI is reviewed in person. There is disc  osteophyte complex at the C4-C5 and also C5-C6 levels. Both areas are associated with significant encroachment on the spinal cord with a marked reduction in spinal fluid at these levels especially at the C5-C6 level. No intrinsic cord lesions are observed.   Blood pressure 126/74, pulse 83, temperature 97.7 F (36.5 C), temperature source Oral, resp. rate 18, height 5' 7"  (1.702 m), weight 111.1 kg (244 lb 14.9 oz), SpO2 94 %.  Past Medical History  Diagnosis Date  . Varicose veins   . New onset atrial fibrillation ([place]) 01/02/2015  . Morbid obesity ([place]) 01/02/2015  . Hypertension   . Sleep apnea     been tested but has not received the CPAP yet  . GERD (gastroesophageal reflux disease)   . History of gout   . Cancer ([place]) 02/2015    colon  . Family history of colon cancer   . Family history of breast cancer in mother     Past Surgical History  Procedure Laterality Date  . Vein ligation and stripping      left leg  . Colonoscopy N/A 03/28/2015    SLF: 1. Abdominal pain, diarrhea, rectal bleeding due to obstructing colon mass  . Esophagogastroduodenoscopy N/A 03/28/2015    SLF: 1. dysphagia 2. Mild non-erosive gastritis.   . Esophageal dilation N/A 03/28/2015    Procedure: ESOPHAGEAL DILATION;  Surgeon: [name], MD;  Location[facility];  Service: Endoscopy;  Laterality: N/A;  . Partial colectomy N/A 04/03/2015    Procedure: PARTIAL COLECTOMY;  Surgeon: [name], MD;  Location: [facility] ORS;  Service: General;  Laterality: N/A;  . Portacath placement N/A 05/10/2015  Procedure: INSERTION PORT-A-CATH;  Surgeon: [name], MD;  Location: [facility];  Service: General;  Laterality: N/A;  left subclavian  . Skin cancer destruction Left 08/16/15    left side of nose and left back  . Colonoscopy N/A 10/03/2015    SLF: normal anastomosis, fair prep (polyps less than 1cm could be missed), anal fissures and verrucous anal canal lesion with benign biopsy. Next TCS in 09/2016.  .  Colonoscopy N/A 10/02/2015    SLF: normal anastomosis, poor bowel prep, three anal fissures    Family History  Problem Relation Age of Onset  . Breast cancer Mother     dx under 22  . Colon cancer Mother     dx in her mid 45s  . Heart disease Father   . Hyperlipidemia Father   . Liver cancer Father     heavy ETOH user when young  . Cancer Sister     slow "blood" cancer  . Diabetes Sister   . Diabetes Brother   . Cancer Maternal Aunt     2-3 maternal aunts with Cancer NOS  . Lung cancer Paternal Uncle     three uncles with lung cancer - all smokers  . Cancer Paternal Grandmother     possible bone cancer    Social History:  reports that he has never smoked. He has never used smokeless tobacco. He reports that he does not drink alcohol or use illicit drugs.  Allergies: No Known Allergies  Medications: Prior to Admission medications   Medication Sig Start Date End Date Taking? Authorizing Provider  ciprofloxacin (CILOXAN) 0.3 % ophthalmic solution Place 1 drop into the left eye every 4 (four) hours while awake.  08/22/15  Yes Historical Provider, MD  ELIQUIS 5 MG TABS tablet TAKE 1 TABLET BY MOUTH TWICE A DAY 08/16/15  Yes Historical Provider, MD  escitalopram (LEXAPRO) 20 MG tablet Take 1 tablet (20 mg total) by mouth daily. 07/17/15  Yes [name], PA-C  furosemide (LASIX) 20 MG tablet Take 1 tablet (20 mg total) by mouth daily. 10/13/15  Yes [name], MD  Linaclotide [facility]) 145 MCG CAPS capsule 1 PO 30 mins prior to your first meal Patient taking differently: Take 145 mcg by mouth daily. 1 PO 30 mins prior to your first meal 10/03/15  Yes [name], MD  lisinopril (PRINIVIL,ZESTRIL) 10 MG tablet Take 1 tablet (10 mg total) by mouth daily. 01/04/15  Yes [name], MD  omeprazole (PRILOSEC) 20 MG capsule Take 1 capsule (20 mg total) by mouth daily. 03/15/15  Yes [name], PA-C  potassium chloride SA (K-DUR,KLOR-CON) 20 MEQ tablet Take 1 tablet (20 mEq  total) by mouth daily. 10/13/15  Yes [name], MD  psyllium (METAMUCIL) 58.6 % packet Take 1 packet by mouth daily.   Yes Historical Provider, MD  lidocaine-prilocaine (EMLA) cream Apply a quarter size amount to port site 1 hour prior to chemo. Do not rub in. Cover with plastic wrap. 04/28/15   [name], MD  nitroGLYCERIN (NITROGLYN) 2 % ointment Apply 0.5 inches topically as needed for chest pain. For rectal area    Historical Provider, MD    Scheduled Meds: . apixaban  10 mg Oral BID  . [START ON 10/22/2015] apixaban  5 mg Oral BID  . ciprofloxacin  1 drop Left Eye Q4H while awake  . escitalopram  20 mg Oral Daily  . furosemide  40 mg Oral BID  . lisinopril  10  mg Oral Daily  . methylPREDNISolone (SOLU-MEDROL) injection  1,000 mg Intravenous Q24H  . pantoprazole  40 mg Oral Daily  . potassium chloride SA  40 mEq Oral Daily  . psyllium  1 packet Oral Daily  . sodium chloride  3 mL Intravenous Q12H   Continuous Infusions:  PRN Meds:.sodium chloride, acetaminophen, ondansetron (ZOFRAN) IV, sodium chloride     Results for orders placed or performed during the hospital encounter of 10/15/15 (from the past 48 hour(s))  Basic metabolic panel     Status: Abnormal   Collection Time: 10/17/15  6:39 AM  Result Value Ref Range   Sodium 139 135 - 145 mmol/L   Potassium 3.6 3.5 - 5.1 mmol/L   Chloride 101 101 - 111 mmol/L   CO2 32 22 - 32 mmol/L   Glucose, Bld 99 65 - 99 mg/dL   BUN 9 6 - 20 mg/dL   Creatinine, Ser 0.97 0.61 - 1.24 mg/dL   Calcium 8.4 (L) 8.9 - 10.3 mg/dL   GFR calc non Af Amer >60 >60 mL/min   GFR calc Af Amer >60 >60 mL/min    Comment: (NOTE) The eGFR has been calculated using the CKD EPI equation. This calculation has not been validated in all clinical situations. eGFR's persistently <60 mL/min signify possible Chronic Kidney Disease.    Anion gap 6 5 - 15  TSH     Status: None   Collection Time: 10/17/15 10:27 AM  Result Value Ref Range   TSH  3.085 0.350 - 4.500 uIU/mL  Vitamin B12     Status: None   Collection Time: 10/17/15 10:28 AM  Result Value Ref Range   Vitamin B-12 786 180 - 914 pg/mL    Comment: (NOTE) This assay is not validated for testing neonatal or myeloproliferative syndrome specimens for Vitamin B12 levels. Performed at [facility]   RPR     Status: None   Collection Time: 10/17/15 10:28 AM  Result Value Ref Range   RPR Ser Ql Non Reactive Non Reactive    Comment: (NOTE) Performed At: [facility] [place], [place] [id number] Lindon Romp MD JO:[id number]   Homocysteine     Status: None   Collection Time: 10/17/15 10:28 AM  Result Value Ref Range   Homocysteine 12.6 0.0 - 15.0 umol/L    Comment: (NOTE) Performed At: [facility] [place], [place] [id number] Lindon Romp MD OQ:[id number]   C difficile quick scan w PCR reflex     Status: None   Collection Time: 10/17/15 10:55 PM  Result Value Ref Range   C Diff antigen NEGATIVE NEGATIVE   C Diff toxin NEGATIVE NEGATIVE   C Diff interpretation Negative for toxigenic C. difficile   Basic metabolic panel     Status: Abnormal   Collection Time: 10/18/15  6:41 AM  Result Value Ref Range   Sodium 137 135 - 145 mmol/L   Potassium 3.6 3.5 - 5.1 mmol/L   Chloride 101 101 - 111 mmol/L   CO2 30 22 - 32 mmol/L   Glucose, Bld 179 (H) 65 - 99 mg/dL   BUN 12 6 - 20 mg/dL   Creatinine, Ser 0.82 0.61 - 1.24 mg/dL   Calcium 8.6 (L) 8.9 - 10.3 mg/dL   GFR calc non Af Amer >60 >60 mL/min   GFR calc Af Amer >60 >60 mL/min    Comment: (NOTE) The eGFR has been calculated using the CKD EPI equation. This calculation  has not been validated in all clinical situations. eGFR's persistently <60 mL/min signify possible Chronic Kidney Disease.    Anion gap 6 5 - 15  CBC     Status: Abnormal   Collection Time: 10/18/15  6:41 AM  Result Value Ref Range   WBC 11.0 (H) 4.0 - 10.5 K/uL   RBC 3.71 (L) 4.22 -  5.81 MIL/uL   Hemoglobin 12.8 (L) 13.0 - 17.0 g/dL   HCT 37.1 (L) 39.0 - 52.0 %   MCV 100.0 78.0 - 100.0 fL   MCH 34.5 (H) 26.0 - 34.0 pg   MCHC 34.5 30.0 - 36.0 g/dL   RDW 13.7 11.5 - 15.5 %   Platelets 124 (L) 150 - 400 K/uL    Studies/Results:     Caidin Heidenreich A. [name], M.D.  Diplomate, Tax adviser of Psychiatry and Neurology ( Neurology). 10/18/2015, 6:28 PM

## 2015-10-18 NOTE — Progress Notes (Signed)
Subjective:  Feeling better. Walking with PT.  Objective:  Vital Signs in the last 24 hours: Temp:  [98.3 F (36.8 C)-98.8 F (37.1 C)] 98.6 F (37 C) (10/19 0500) Pulse Rate:  [74-83] 83 (10/19 0500) Resp:  [18] 18 (10/19 0500) BP: (114-118)/(64-73) 118/73 mmHg (10/19 0500) SpO2:  [98 %-100 %] 99 % (10/19 0500) Weight:  [244 lb 14.9 oz (111.1 kg)] 244 lb 14.9 oz (111.1 kg) (10/19 0500)  Intake/Output from previous day: 10/18 0701 - 10/19 0700 In: 720 [P.O.:720] Out: 3925 [Urine:3925] Intake/Output from this shift: Total I/O In: 240 [P.O.:240] Out: -   Physical Exam: NECK:Increased JVD, HJR LUNGS: Decreased breath sounds but Clear anterior, posterior, lateral HEART: Iregular rate and rhythm, 2/6 systolic murmur LSB,no gallop, rub, bruit, thrill, or heave EXTREMITIES: plus 1 edema on right, trace on left   Lab Results:  Recent Labs  10/18/15 0641  WBC 11.0*  HGB 12.8*  PLT 124*    Recent Labs  10/17/15 0639 10/18/15 0641  NA 139 137  K 3.6 3.6  CL 101 101  CO2 32 30  GLUCOSE 99 179*  BUN 9 12  CREATININE 0.97 0.82   No results for input(s): TROPONINI in the last 72 hours.  Invalid input(s): CK, MB Hepatic Function Panel No results for input(s): PROT, ALBUMIN, AST, ALT, ALKPHOS, BILITOT, BILIDIR, IBILI in the last 72 hours. No results for input(s): CHOL in the last 72 hours. No results for input(s): PROTIME in the last 72 hours.  Imaging:   Cardiac Studies:  Assessment/Plan:  1. Atrial fib: Rate is controlled No rate control medications. Back on NOAC with higher dose for 7 days in the setting of DVT off Eliquis for 2 weeks for colonoscopy.    2. Acute on Chronic Diastolic CHF: He has diuresed 13 liters since admission and weight has gone from 268 lbs down to 244.. Creatinine 0.82. Dry wt was around 250 lbs. Still with some increased JVD and mild edema. Took Lasix 40 mg daily at home. Will switch to 40 mg po BID and see how he does.  3. Colon CA:  Followed by oncology.   4. Acute Right Femoral DVT: on Eliquis.    LOS: 2 days    [name] 10/18/2015, 9:40 AM   The patient was seen and examined, and I agree with the assessment and plan as documented above, with modifications as noted below. Pt had nearly 3L output in last 24 hours. Agree with switching to Lasix 40 mg bid. High-dose steroids likely also contributing to leg swelling. Continue Eliquis for acute right femoral DVT.

## 2015-10-18 NOTE — Progress Notes (Signed)
Physical Therapy Treatment Patient Details Name: [name] MRN: [id number] DOB: 1946-07-13 Today's Date: 10/18/2015    History of Present Illness Pt is a 69yo white male, morbidly obese, currently undergoing chemotherapy for CA. Pt was admitted with multiple medical problems. He has a DVT in his right leg. He has marked swelling of both legs. He had gained about 20 pounds. In addition to that he reports chronic decline of balance over the past 6 weeks and has lost control of his stool. He is known to have atrial fibrillation and he was anticoagulated on Eliquis but he had been off that so he could have colonoscopy so I don't think his clot is a failure of his anticoagulant. He had echocardiogram about a month ago that showed normal left ventricular systolic function but diastolic function could not be assessed but it is felt that he probably has chronic diastolic heart failure. He says he feels a little better    PT Comments    Pt initially reluctant to work with therapist but ultimately agreed.  Pt able to complete bed exercises with no difficulty.  Ambulating causes pt to fatigue fairly quickly but able to recover after minimal break.   Follow Up Recommendations  Home health PT     Equipment Recommendations  None recommended by PT    Recommendations for Other Services  none     Precautions / Restrictions Precautions Precautions: Fall Restrictions Weight Bearing Restrictions: No    Mobility  Bed Mobility Overal bed mobility: Modified Independent                Transfers Overall transfer level: Modified independent                  Ambulation/Gait Ambulation/Gait assistance: Min guard Ambulation Distance (Feet): 180 Feet Assistive device: Rolling walker (2 wheeled) Gait Pattern/deviations: Decreased step length - right;Decreased step length - left                   Cognition Arousal/Alertness: Awake/alert Behavior During Therapy: WFL for  tasks assessed/performed Overall Cognitive Status: Within Functional Limits for tasks assessed                      Exercises General Exercises - Lower Extremity Ankle Circles/Pumps: Both;10 reps Quad Sets: Both;10 reps Gluteal Sets: Both;10 reps Heel Slides: Both;10 reps Hip ABduction/ADduction: Both;10 reps Straight Leg Raises: Both;5 reps Other Exercises Other Exercises: bridges x 10        Pertinent Vitals/Pain Pain Score: 5  Pain Location: leg Pain Descriptors / Indicators: [place]  lives with wife at home who is supportive.                     Prior Function     I        PT Goals (current goals can now be found in the care plan section) Acute Rehab PT Goals Patient Stated Goal: Return to home, decrease falls, improve indep mobility  PT Goal Formulation: With patient/family Potential to Achieve Goals: Good Progress towards PT goals: Progressing toward goals    Frequency  Min 3X/week    PT Plan Current plan remains appropriate       End of Session Equipment Utilized During Treatment: Gait belt Activity Tolerance: Patient limited by fatigue Patient left: in chair;with call bell/phone within reach;with family/visitor present     Time: 7124-5809 PT Time Calculation (min) (ACUTE ONLY): 32 min  Charges:  $Gait Training: 8-22 mins $Therapeutic Exercise: 8-22 mins                    G CodesRayetta Humphrey, PT CLT [phone] 10/18/2015, 3:45 PM

## 2015-10-19 ENCOUNTER — Inpatient Hospital Stay (HOSPITAL_COMMUNITY): Payer: Medicare Other

## 2015-10-19 ENCOUNTER — Ambulatory Visit: Payer: Medicare Other | Admitting: Adult Health

## 2015-10-19 DIAGNOSIS — R4182 Altered mental status, unspecified: Secondary | ICD-10-CM | POA: Diagnosis not present

## 2015-10-19 DIAGNOSIS — N289 Disorder of kidney and ureter, unspecified: Secondary | ICD-10-CM

## 2015-10-19 LAB — URINALYSIS, ROUTINE W REFLEX MICROSCOPIC
Bilirubin Urine: NEGATIVE
GLUCOSE, UA: NEGATIVE mg/dL
Ketones, ur: NEGATIVE mg/dL
LEUKOCYTES UA: NEGATIVE
Nitrite: NEGATIVE
PH: 6.5 (ref 5.0–8.0)
Protein, ur: NEGATIVE mg/dL
Specific Gravity, Urine: 1.01 (ref 1.005–1.030)
Urobilinogen, UA: 1 mg/dL (ref 0.0–1.0)

## 2015-10-19 LAB — BLOOD GAS, ARTERIAL
Acid-Base Excess: 6.1 mmol/L — ABNORMAL HIGH (ref 0.0–2.0)
Bicarbonate: 30.2 mEq/L — ABNORMAL HIGH (ref 20.0–24.0)
DRAWN BY: 277331
FIO2: 0.21
O2 Saturation: 97.7 %
PCO2 ART: 36.9 mmHg (ref 35.0–45.0)
Patient temperature: 97.5
pH, Arterial: 7.512 — ABNORMAL HIGH (ref 7.350–7.450)
pO2, Arterial: 94.6 mmHg (ref 80.0–100.0)

## 2015-10-19 LAB — CBC
HEMATOCRIT: 39.4 % (ref 39.0–52.0)
Hemoglobin: 13.6 g/dL (ref 13.0–17.0)
MCH: 34.8 pg — ABNORMAL HIGH (ref 26.0–34.0)
MCHC: 34.5 g/dL (ref 30.0–36.0)
MCV: 100.8 fL — ABNORMAL HIGH (ref 78.0–100.0)
PLATELETS: 137 10*3/uL — AB (ref 150–400)
RBC: 3.91 MIL/uL — AB (ref 4.22–5.81)
RDW: 14.1 % (ref 11.5–15.5)
WBC: 11.9 10*3/uL — AB (ref 4.0–10.5)

## 2015-10-19 LAB — TROPONIN I
Troponin I: <0.03 ng/mL (ref <0.031)
Troponin I: <0.03 ng/mL (ref <0.031)

## 2015-10-19 LAB — COMPREHENSIVE METABOLIC PANEL
ALT: 42 U/L (ref 17–63)
AST: 77 U/L — AB (ref 15–41)
Albumin: 2.4 g/dL — ABNORMAL LOW (ref 3.5–5.0)
Alkaline Phosphatase: 120 U/L (ref 38–126)
Anion gap: 8 (ref 5–15)
BILIRUBIN TOTAL: 1.8 mg/dL — AB (ref 0.3–1.2)
BUN: 21 mg/dL — AB (ref 6–20)
CO2: 29 mmol/L (ref 22–32)
CREATININE: 0.95 mg/dL (ref 0.61–1.24)
Calcium: 8.7 mg/dL — ABNORMAL LOW (ref 8.9–10.3)
Chloride: 102 mmol/L (ref 101–111)
GFR calc Af Amer: >60 mL/min (ref >60)
Glucose, Bld: 152 mg/dL — ABNORMAL HIGH (ref 65–99)
POTASSIUM: 4.2 mmol/L (ref 3.5–5.1)
Sodium: 139 mmol/L (ref 135–145)
TOTAL PROTEIN: 6.8 g/dL (ref 6.5–8.1)

## 2015-10-19 LAB — GLUCOSE, CAPILLARY: Glucose-Capillary: 142 mg/dL — ABNORMAL HIGH (ref 65–99)

## 2015-10-19 LAB — AMMONIA: AMMONIA: 34 umol/L (ref 9–35)

## 2015-10-19 LAB — MRSA PCR SCREENING: MRSA by PCR: NEGATIVE

## 2015-10-19 LAB — URINE MICROSCOPIC-ADD ON

## 2015-10-19 MED ORDER — ENOXAPARIN SODIUM 120 MG/0.8ML ~~LOC~~ SOLN
110.0000 mg | Freq: Two times a day (BID) | SUBCUTANEOUS | Status: DC
  Administered 2015-10-19 – 2015-10-21 (×5): 110 mg via SUBCUTANEOUS
  Filled 2015-10-19 (×9): qty 0.8

## 2015-10-19 MED ORDER — IOHEXOL 300 MG/ML  SOLN
75.0000 mL | Freq: Once | INTRAMUSCULAR | Status: AC | PRN
  Administered 2015-10-19: 75 mL via INTRAVENOUS

## 2015-10-19 NOTE — Progress Notes (Signed)
ANTICOAGULATION CONSULT NOTE - Initial Consult  Pharmacy Consult for Lovenox Indication: atrial fibrillation / DVT  No Known Allergies  Patient Measurements: Height: 5\' 7"  (170.2 cm) Weight: 244 lb 0.8 oz (110.7 kg) IBW/kg (Calculated) : 66.1  Vital Signs: Temp: 98.2 F (36.8 C) (10/20 1003) Temp Source: Axillary (10/20 1003) BP: 124/73 mmHg (10/20 1000) Pulse Rate: 74 (10/20 1000)  Labs:  Recent Labs  10/17/15 0639 10/18/15 0641 10/19/15 0802  HGB  --  12.8* 13.6  HCT  --  37.1* 39.4  PLT  --  124* 137*  CREATININE 0.97 0.82  --    Estimated Creatinine Clearance: 102.3 mL/min (by C-G formula based on Cr of 0.82).  Medical History: Past Medical History  Diagnosis Date  . Varicose veins   . New onset atrial fibrillation ([place]) 01/02/2015  . Morbid obesity ([place]) 01/02/2015  . Hypertension   . Sleep apnea     been tested but has not received the CPAP yet  . GERD (gastroesophageal reflux disease)   . History of gout   . Cancer ([place]) 02/2015    colon  . Family history of colon cancer   . Family history of breast cancer in mother    Assessment: 69yo morbidly obese male, was on Eliquis for atrial fibrillation and DVT.  Pt had change in mental status and is now unresponsive.  Asked to switch to Lovenox. No bleeding noted.    Goal of Therapy:  Anti-Xa level 0.6-1 units/ml 4hrs after LMWH dose given Monitor platelets by anticoagulation protocol: Yes   Plan:  Lovenox 1mg /Kg SQ q12hrs (110mg ) Monitor CBC and s/sx of bleeding  [place] Crane, [place] A 10/19/2015,10:24 AM

## 2015-10-19 NOTE — Progress Notes (Signed)
Consulting cardiologist: Kate Sable MD Primary Cardiologist: Carlyle Dolly MD  Cardiology Specific Problem List: 1.Acute on Chronic Diastolic CHF 2. Atrial fibrillation CHADS VASC Score of 2.   Subjective:   Moved to Stepdown due to change in status, unresponsive . Does not follow commands. Question acute CVA.    Objective:   Temp:  [97.5 F (36.4 C)-98.2 F (36.8 C)] 97.5 F (36.4 C) (10/20 0549) Pulse Rate:  [83-97] 97 (10/20 0755) Resp:  [15-18] 15 (10/20 0549) BP: (99-126)/(63-74) 106/68 mmHg (10/20 0755) SpO2:  [94 %-98 %] 95 % (10/20 0549) Weight:  [244 lb 0.8 oz (110.7 kg)] 244 lb 0.8 oz (110.7 kg) (10/20 0549) Last BM Date: 10/18/15  Filed Weights   10/17/15 0618 10/18/15 0500 10/19/15 0549  Weight: 242 lb (109.77 kg) 244 lb 14.9 oz (111.1 kg) 244 lb 0.8 oz (110.7 kg)    Intake/Output Summary (Last 24 hours) at 10/19/15 0933 Last data filed at 10/19/15 0641  Gross per 24 hour  Intake      0 ml  Output   1250 ml  Net  -1250 ml    Telemetry: Atrial fibrillation   Exam:  General: Lethargic. Opens eyes to verbal commands, but no verbal response.   HEENT: Conjunctiva and lids normal, oropharynx clear. Facial drooping on the left.   Lungs: Clear to auscultation, nonlabored.  Cardiac: No elevated JVP or bruits. IRRR, no gallop or rub.   Abdomen: Normoactive bowel sounds, nontender, nondistended.  Extremities: No pitting edema, distal pulses full.  Neuropsychiatric: Lethargic, does not follow commands. Unable to grip hands or flex feet on command.   Lab Results:  Basic Metabolic Panel:  Recent Labs Lab 10/16/15 0640 10/17/15 0639 10/18/15 0641  NA 136 139 137  K 3.5 3.6 3.6  CL 100* 101 101  CO2 28 32 30  GLUCOSE 113* 99 179*  BUN 7 9 12   CREATININE 0.81 0.97 0.82  CALCIUM 8.3* 8.4* 8.6*    Liver Function Tests:  Recent Labs Lab 10/14/15 1334  AST 71*  ALT 36  ALKPHOS 133*  BILITOT 2.1*  PROT 6.1*  ALBUMIN 2.2*     CBC:  Recent Labs Lab 10/14/15 1334 10/18/15 0641 10/19/15 0802  WBC 3.4* 11.0* 11.9*  HGB 11.3* 12.8* 13.6  HCT 33.5* 37.1* 39.4  MCV 103.1* 100.0 100.8*  PLT 83* 124* 137*    Radiology: Ct Head W Wo Contrast  10/19/2015  CLINICAL DATA:  Altered mental status. Decreased responsiveness since yesterday. Personal history of colon cancer this year. EXAM: CT HEAD WITHOUT AND WITH CONTRAST TECHNIQUE: Contiguous axial images were obtained from the base of the skull through the vertex without and with intravenous contrast CONTRAST:  73mL OMNIPAQUE IOHEXOL 300 MG/ML  SOLN COMPARISON:  Normal MRI of the brain for age 66/05/2015. FINDINGS: No acute infarct, hemorrhage, or mass lesion is present. Basal ganglia are intact. Insular ribbon and cerebral cortex is within normal limits. Brainstem and cerebellum are unremarkable. The postcontrast images demonstrate no acute infarct or focal enhancing lesion. Ventricles are of normal size. No significant extra-axial fluid collection is present. The globes and orbits are intact. The paranasal sinuses and mastoid air cells are clear. Calvarium is within normal limits. IMPRESSION: Negative CT of the head without and with contrast. Electronically Signed   By: San Morelle M.D.   On: 10/19/2015 09:19     Medications:   Scheduled Medications: . apixaban  10 mg Oral BID  . [START ON 10/22/2015] apixaban  5 mg  Oral BID  . ciprofloxacin  1 drop Left Eye Q4H while awake  . escitalopram  20 mg Oral Daily  . furosemide  40 mg Oral BID  . lisinopril  10 mg Oral Daily  . methylPREDNISolone (SOLU-MEDROL) injection  1,000 mg Intravenous Q24H  . pantoprazole  40 mg Oral Daily  . potassium chloride SA  40 mEq Oral Daily  . psyllium  1 packet Oral Daily  . sodium chloride  3 mL Intravenous Q12H      PRN Medications: sodium chloride, acetaminophen, ondansetron (ZOFRAN) IV, sodium chloride   Assessment and Plan:   1.Acute Altered Mental status  change: Occurred overnight. Wife states that he was tired after going back to bed from sitting in the chair yesterday. Was able to speak and move extremities. Now no verbal response, does not follow commands. CT completed this am does not show acute infarct or bleed. Neuro consult is called this am.   2. Atrial fib: Continues on Elliquis. May need to change to heparin or LMWH as he is not eating at this time. Not on rate control at this time. Will have pharmacy start the [place] as no acute bleeding is seen on CT.   3. Acute on Chronic Diastolic CHF: Improved. Has diuresed 14.052 since admission, 1.250 overnight. Wt is within a couple of pounds. CO2 pending with BMET.   Phill Myron. Lawrence NP [place]  10/19/2015, 9:33 AM   The patient was seen and examined, and I agree with the assessment and plan as documented above, with modifications as noted below. Pt unresponsive to verbal stimuli but responds to tactile stimuli. Spoke with wife at length. No clear precipitating cause. Had been intermittently confused last night as per wife but then he would become coherent. No antecedent chest pain or shortness of breath. Head CT, UA, troponins all unremarkable. Has diuresed well with marked increase in BUN/Cr. Will hold Lasix today. Agree with enoxaparin for anticoagulation for atrial fibrillation for the time being. Neurology to evaluate.

## 2015-10-19 NOTE — Progress Notes (Addendum)
[name], male   DOB: January 11, 1946, 69 y.o.   MRN: [id number]  Boaz A. [name], MD     [email or web address]          [name] is an 69 y.o. male.     ASSESSMENT/PLAN: Acute encephalopathy. The acute nature suggest acute ischemic infarct not seen on CT. He does have a some twitching of the right upper extremity suggestive of seizures although not convincing. Repeat MRI is suggested. EEG is also suggested.  Multifactorial gait impairment including osteoarthritis and the radiographic evidence of cord encroachment. No other neurological causes are uncovered. As noted Overtly draumatic evidence of myelopathy although he does have the MRI findings of the cervical spine. There is no evidence of neuropathy or parkinsonism.   Chronic atrial fibrillation on chronic anticoagulation.  Congestive heart failure.     The patient has been nonresponsive. This is a marked change. The patient was transferred to the ICU because becoming acutely Obtunded and stuporous. Extensive discussion with the family due to critical illness.    GENERAL: Pleasant obese man in no acute distress.  HEENT: Supple. Atraumatic normocephalic.   ABDOMEN: soft  EXTREMITIES: One plus pitting edema of the distal legs and ankles. Marked arthritic changes of the knees.   BACK: Normal.  SKIN: Normal by inspection.   MENTAL STATUS: The patient is laying in bed with eyes closed. He opens his eyes to deep painful some mild says yes but does not follow commands. Otherwise, there are no verbal operated. He resists the evaluation.  CRANIAL NERVES: Pupils are equal, round and reactive to light; extra ocular movements are full, there is no significant nystagmus; visual fields are full; upper and lower facial muscles are normal in strength and symmetric, there is no flattening of the nasolabial folds.  MOTOR: He moves both sides to deep painful.  COORDINATION: Occasional  twitching /Tremor right upper extremity. There is some rigidity also in the right upper extremity.  REFLEXES: Deep tendon reflexes are symmetrical and normal.   The cervical spine MRI is reviewed in person. There is disc osteophyte complex at the C4-C5 and also C5-C6 levels. Both areas are associated with significant encroachment on the spinal cord with a marked reduction in spinal fluid at these levels especially at the C5-C6 level. No intrinsic cord lesions are observed.   Blood pressure 130/80, pulse 84, temperature 97.8 F (36.6 C), temperature source Oral, resp. rate 12, height 5' 7"  (1.702 m), weight 110 kg (242 lb 8.1 oz), SpO2 95 %.  Past Medical History  Diagnosis Date  . Varicose veins   . New onset atrial fibrillation ([place]) 01/02/2015  . Morbid obesity ([place]) 01/02/2015  . Hypertension   . Sleep apnea     been tested but has not received the CPAP yet  . GERD (gastroesophageal reflux disease)   . History of gout   . Cancer ([place]) 02/2015    colon  . Family history of colon cancer   . Family history of breast cancer in mother     Past Surgical History  Procedure Laterality Date  . Vein ligation and stripping      left leg  . Colonoscopy N/A 03/28/2015    SLF: 1. Abdominal pain, diarrhea, rectal bleeding due to obstructing colon mass  . Esophagogastroduodenoscopy N/A 03/28/2015    SLF: 1. dysphagia 2. Mild non-erosive gastritis.   . Esophageal dilation N/A 03/28/2015    Procedure: ESOPHAGEAL DILATION;  Surgeon: [name], MD;  Location: [facility];  Service: Endoscopy;  Laterality: N/A;  . Partial colectomy N/A 04/03/2015    Procedure: PARTIAL COLECTOMY;  Surgeon: [name], MD;  Location: [facility] ORS;  Service: General;  Laterality: N/A;  . Portacath placement N/A 05/10/2015    Procedure: INSERTION PORT-A-CATH;  Surgeon: [name], MD;  Location: [facility] ORS;  Service: General;  Laterality: N/A;  left subclavian  . Skin cancer destruction Left 08/16/15    left side of  nose and left back  . Colonoscopy N/A 10/03/2015    SLF: normal anastomosis, fair prep (polyps less than 1cm could be missed), anal fissures and verrucous anal canal lesion with benign biopsy. Next TCS in 09/2016.  . Colonoscopy N/A 10/02/2015    SLF: normal anastomosis, poor bowel prep, three anal fissures    Family History  Problem Relation Age of Onset  . Breast cancer Mother     dx under 21  . Colon cancer Mother     dx in her mid 42s  . Heart disease Father   . Hyperlipidemia Father   . Liver cancer Father     heavy ETOH user when young  . Cancer Sister     slow "blood" cancer  . Diabetes Sister   . Diabetes Brother   . Cancer Maternal Aunt     2-3 maternal aunts with Cancer NOS  . Lung cancer Paternal Uncle     three uncles with lung cancer - all smokers  . Cancer Paternal Grandmother     possible bone cancer    Social History:  reports that he has never smoked. He has never used smokeless tobacco. He reports that he does not drink alcohol or use illicit drugs.  Allergies: No Known Allergies  Medications: Prior to Admission medications   Medication Sig Start Date End Date Taking? Authorizing Provider  ciprofloxacin (CILOXAN) 0.3 % ophthalmic solution Place 1 drop into the left eye every 4 (four) hours while awake.  08/22/15  Yes Historical Provider, MD  ELIQUIS 5 MG TABS tablet TAKE 1 TABLET BY MOUTH TWICE A DAY 08/16/15  Yes Historical Provider, MD  escitalopram (LEXAPRO) 20 MG tablet Take 1 tablet (20 mg total) by mouth daily. 07/17/15  Yes [name], PA-C  furosemide (LASIX) 20 MG tablet Take 1 tablet (20 mg total) by mouth daily. 10/13/15  Yes [name], MD  Linaclotide [facility]) 145 MCG CAPS capsule 1 PO 30 mins prior to your first meal Patient taking differently: Take 145 mcg by mouth daily. 1 PO 30 mins prior to your first meal 10/03/15  Yes [name], MD  lisinopril (PRINIVIL,ZESTRIL) 10 MG tablet Take 1 tablet (10 mg total) by mouth daily. 01/04/15   Yes [name], MD  omeprazole (PRILOSEC) 20 MG capsule Take 1 capsule (20 mg total) by mouth daily. 03/15/15  Yes [name], PA-C  potassium chloride SA (K-DUR,KLOR-CON) 20 MEQ tablet Take 1 tablet (20 mEq total) by mouth daily. 10/13/15  Yes [name], MD  psyllium (METAMUCIL) 58.6 % packet Take 1 packet by mouth daily.   Yes Historical Provider, MD  lidocaine-prilocaine (EMLA) cream Apply a quarter size amount to port site 1 hour prior to chemo. Do not rub in. Cover with plastic wrap. 04/28/15   [name], MD  nitroGLYCERIN (NITROGLYN) 2 % ointment Apply 0.5 inches topically as needed for chest pain. For rectal area    Historical Provider, MD    Scheduled Meds: . ciprofloxacin  1 drop Left Eye Q4H while awake  . enoxaparin (LOVENOX) injection  110 mg Subcutaneous Q12H  . escitalopram  20 mg Oral Daily  . lisinopril  10 mg Oral Daily  . pantoprazole  40 mg Oral Daily  . potassium chloride SA  40 mEq Oral Daily  . psyllium  1 packet Oral Daily  . sodium chloride  3 mL Intravenous Q12H   Continuous Infusions:  PRN Meds:.sodium chloride, acetaminophen, ondansetron (ZOFRAN) IV, sodium chloride     Results for orders placed or performed during the hospital encounter of 10/15/15 (from the past 48 hour(s))  C difficile quick scan w PCR reflex     Status: None   Collection Time: 10/17/15 10:55 PM  Result Value Ref Range   C Diff antigen NEGATIVE NEGATIVE   C Diff toxin NEGATIVE NEGATIVE   C Diff interpretation Negative for toxigenic C. difficile   Basic metabolic panel     Status: Abnormal   Collection Time: 10/18/15  6:41 AM  Result Value Ref Range   Sodium 137 135 - 145 mmol/L   Potassium 3.6 3.5 - 5.1 mmol/L   Chloride 101 101 - 111 mmol/L   CO2 30 22 - 32 mmol/L   Glucose, Bld 179 (H) 65 - 99 mg/dL   BUN 12 6 - 20 mg/dL   Creatinine, Ser 0.82 0.61 - 1.24 mg/dL   Calcium 8.6 (L) 8.9 - 10.3 mg/dL   GFR calc non Af Amer >60 >60 mL/min   GFR calc Af Amer  >60 >60 mL/min    Comment: (NOTE) The eGFR has been calculated using the CKD EPI equation. This calculation has not been validated in all clinical situations. eGFR's persistently <60 mL/min signify possible Chronic Kidney Disease.    Anion gap 6 5 - 15  CBC     Status: Abnormal   Collection Time: 10/18/15  6:41 AM  Result Value Ref Range   WBC 11.0 (H) 4.0 - 10.5 K/uL   RBC 3.71 (L) 4.22 - 5.81 MIL/uL   Hemoglobin 12.8 (L) 13.0 - 17.0 g/dL   HCT 37.1 (L) 39.0 - 52.0 %   MCV 100.0 78.0 - 100.0 fL   MCH 34.5 (H) 26.0 - 34.0 pg   MCHC 34.5 30.0 - 36.0 g/dL   RDW 13.7 11.5 - 15.5 %   Platelets 124 (L) 150 - 400 K/uL  Blood gas, arterial     Status: Abnormal   Collection Time: 10/19/15  7:50 AM  Result Value Ref Range   FIO2 0.21    pH, Arterial 7.512 (H) 7.350 - 7.450   pCO2 arterial 36.9 35.0 - 45.0 mmHg   pO2, Arterial 94.6 80.0 - 100.0 mmHg   Bicarbonate 30.2 (H) 20.0 - 24.0 mEq/L   Acid-Base Excess 6.1 (H) 0.0 - 2.0 mmol/L   O2 Saturation 97.7 %   Patient temperature 97.5    Collection site LEFT RADIAL    Drawn by [id number]    Sample type ARTERIAL DRAW    Allens test (pass/fail) PASS PASS  CBC     Status: Abnormal   Collection Time: 10/19/15  8:02 AM  Result Value Ref Range   WBC 11.9 (H) 4.0 - 10.5 K/uL   RBC 3.91 (L) 4.22 - 5.81 MIL/uL   Hemoglobin 13.6 13.0 - 17.0 g/dL   HCT 39.4 39.0 - 52.0 %   MCV 100.8 (H) 78.0 - 100.0 fL   MCH 34.8 (H) 26.0 - 34.0 pg   MCHC 34.5 30.0 - 36.0  g/dL   RDW 14.1 11.5 - 15.5 %   Platelets 137 (L) 150 - 400 K/uL  Glucose, capillary     Status: Abnormal   Collection Time: 10/19/15  9:41 AM  Result Value Ref Range   Glucose-Capillary 142 (H) 65 - 99 mg/dL   Comment 1 Notify RN    Comment 2 Document in Chart   Urinalysis, Routine w reflex microscopic (not at [facility])     Status: Abnormal   Collection Time: 10/19/15  9:47 AM  Result Value Ref Range   Color, Urine YELLOW YELLOW   APPearance CLEAR CLEAR   Specific Gravity, Urine 1.010  1.005 - 1.030   pH 6.5 5.0 - 8.0   Glucose, UA NEGATIVE NEGATIVE mg/dL   Hgb urine dipstick SMALL (A) NEGATIVE   Bilirubin Urine NEGATIVE NEGATIVE   Ketones, ur NEGATIVE NEGATIVE mg/dL   Protein, ur NEGATIVE NEGATIVE mg/dL   Urobilinogen, UA 1.0 0.0 - 1.0 mg/dL   Nitrite NEGATIVE NEGATIVE   Leukocytes, UA NEGATIVE NEGATIVE  MRSA PCR Screening     Status: None   Collection Time: 10/19/15  9:47 AM  Result Value Ref Range   MRSA by PCR NEGATIVE NEGATIVE    Comment:        The GeneXpert MRSA Assay (FDA approved for NASAL specimens only), is one component of a comprehensive MRSA colonization surveillance program. It is not intended to diagnose MRSA infection nor to guide or monitor treatment for MRSA infections.   Urine microscopic-add on     Status: None   Collection Time: 10/19/15  9:47 AM  Result Value Ref Range   RBC / HPF 0-2 <3 RBC/hpf  Comprehensive metabolic panel     Status: Abnormal   Collection Time: 10/19/15 10:05 AM  Result Value Ref Range   Sodium 139 135 - 145 mmol/L   Potassium 4.2 3.5 - 5.1 mmol/L   Chloride 102 101 - 111 mmol/L   CO2 29 22 - 32 mmol/L   Glucose, Bld 152 (H) 65 - 99 mg/dL   BUN 21 (H) 6 - 20 mg/dL   Creatinine, Ser 0.95 0.61 - 1.24 mg/dL   Calcium 8.7 (L) 8.9 - 10.3 mg/dL   Total Protein 6.8 6.5 - 8.1 g/dL   Albumin 2.4 (L) 3.5 - 5.0 g/dL   AST 77 (H) 15 - 41 U/L   ALT 42 17 - 63 U/L   Alkaline Phosphatase 120 38 - 126 U/L   Total Bilirubin 1.8 (H) 0.3 - 1.2 mg/dL   GFR calc non Af Amer >60 >60 mL/min   GFR calc Af Amer >60 >60 mL/min    Comment: (NOTE) The eGFR has been calculated using the CKD EPI equation. This calculation has not been validated in all clinical situations. eGFR's persistently <60 mL/min signify possible Chronic Kidney Disease.    Anion gap 8 5 - 15  Troponin I (q 6hr x 3)     Status: None   Collection Time: 10/19/15 10:05 AM  Result Value Ref Range   Troponin I <0.03 <0.031 ng/mL    Comment:        NO  INDICATION OF MYOCARDIAL INJURY.   Ammonia     Status: None   Collection Time: 10/19/15 10:05 AM  Result Value Ref Range   Ammonia 34 9 - 35 umol/L  Troponin I (q 6hr x 3)     Status: None   Collection Time: 10/19/15  4:00 PM  Result Value Ref Range   Troponin I <  0.03 <0.031 ng/mL    Comment:        NO INDICATION OF MYOCARDIAL INJURY.     Studies/Results:   Repeat head CT scan with contrast shows no acute changes and essentially is unrevealing.  Cliffton Spradley A. [name], M.D.  Diplomate, Tax adviser of Psychiatry and Neurology ( Neurology). 10/19/2015, 6:27 PM

## 2015-10-19 NOTE — Progress Notes (Signed)
Subjective: He was found to be significantly changed this morning. He is less responsive. Blood gas was done and does not show elevation of PCO2 so I don't think this is obesity hypoventilation. Other laboratory work is pending. When I speak to him he will open his eyes and he moves his arm but it's not at all clear what has happened.  Objective: Vital signs in last 24 hours: Temp:  [97.5 F (36.4 C)-98.2 F (36.8 C)] 97.5 F (36.4 C) (10/20 0549) Pulse Rate:  [83-97] 97 (10/20 0755) Resp:  [15-18] 15 (10/20 0549) BP: (99-126)/(63-74) 106/68 mmHg (10/20 0755) SpO2:  [94 %-98 %] 95 % (10/20 0549) Weight:  [110.7 kg (244 lb 0.8 oz)] 110.7 kg (244 lb 0.8 oz) (10/20 0549) Weight change: -0.4 kg (-14.1 oz) Last BM Date: 10/18/15  Intake/Output from previous day: 10/19 0701 - 10/20 0700 In: 240 [P.O.:240] Out: 1250 [Urine:1250]  PHYSICAL EXAM General appearance: Much less responsive, morbidly obese Resp: clear to auscultation bilaterally Cardio: regularly irregular rhythm GI: soft, non-tender; bowel sounds normal; no masses,  no organomegaly Extremities: His edema is substantially improved  Lab Results:  Results for orders placed or performed during the hospital encounter of 10/15/15 (from the past 48 hour(s))  TSH     Status: None   Collection Time: 10/17/15 10:27 AM  Result Value Ref Range   TSH 3.085 0.350 - 4.500 uIU/mL  Vitamin B12     Status: None   Collection Time: 10/17/15 10:28 AM  Result Value Ref Range   Vitamin B-12 786 180 - 914 pg/mL    Comment: (NOTE) This assay is not validated for testing neonatal or myeloproliferative syndrome specimens for Vitamin B12 levels. Performed at [facility]   RPR     Status: None   Collection Time: 10/17/15 10:28 AM  Result Value Ref Range   RPR Ser Ql Non Reactive Non Reactive    Comment: (NOTE) Performed At: [facility] [place], [place] [id number] Lindon Romp MD [id number]    Homocysteine     Status: None   Collection Time: 10/17/15 10:28 AM  Result Value Ref Range   Homocysteine 12.6 0.0 - 15.0 umol/L    Comment: (NOTE) Performed At: [facility] [place], [place] [id number] Lindon Romp MD [id number]   C difficile quick scan w PCR reflex     Status: None   Collection Time: 10/17/15 10:55 PM  Result Value Ref Range   C Diff antigen NEGATIVE NEGATIVE   C Diff toxin NEGATIVE NEGATIVE   C Diff interpretation Negative for toxigenic C. difficile   Basic metabolic panel     Status: Abnormal   Collection Time: 10/18/15  6:41 AM  Result Value Ref Range   Sodium 137 135 - 145 mmol/L   Potassium 3.6 3.5 - 5.1 mmol/L   Chloride 101 101 - 111 mmol/L   CO2 30 22 - 32 mmol/L   Glucose, Bld 179 (H) 65 - 99 mg/dL   BUN 12 6 - 20 mg/dL   Creatinine, Ser 0.82 0.61 - 1.24 mg/dL   Calcium 8.6 (L) 8.9 - 10.3 mg/dL   GFR calc non Af Amer >60 >60 mL/min   GFR calc Af Amer >60 >60 mL/min    Comment: (NOTE) The eGFR has been calculated using the CKD EPI equation. This calculation has not been validated in all clinical situations. eGFR's persistently <60 mL/min signify possible Chronic Kidney Disease.    Anion gap 6  5 - 15  CBC     Status: Abnormal   Collection Time: 10/18/15  6:41 AM  Result Value Ref Range   WBC 11.0 (H) 4.0 - 10.5 K/uL   RBC 3.71 (L) 4.22 - 5.81 MIL/uL   Hemoglobin 12.8 (L) 13.0 - 17.0 g/dL   HCT 37.1 (L) 39.0 - 52.0 %   MCV 100.0 78.0 - 100.0 fL   MCH 34.5 (H) 26.0 - 34.0 pg   MCHC 34.5 30.0 - 36.0 g/dL   RDW 13.7 11.5 - 15.5 %   Platelets 124 (L) 150 - 400 K/uL  Blood gas, arterial     Status: Abnormal   Collection Time: 10/19/15  7:50 AM  Result Value Ref Range   FIO2 0.21    pH, Arterial 7.512 (H) 7.350 - 7.450   pCO2 arterial 36.9 35.0 - 45.0 mmHg   pO2, Arterial 94.6 80.0 - 100.0 mmHg   Bicarbonate 30.2 (H) 20.0 - 24.0 mEq/L   Acid-Base Excess 6.1 (H) 0.0 - 2.0 mmol/L   O2 Saturation 97.7 %   Patient  temperature 97.5    Collection site LEFT RADIAL    Drawn by [id number]    Sample type ARTERIAL DRAW    Allens test (pass/fail) PASS PASS  CBC     Status: Abnormal   Collection Time: 10/19/15  8:02 AM  Result Value Ref Range   WBC 11.9 (H) 4.0 - 10.5 K/uL   RBC 3.91 (L) 4.22 - 5.81 MIL/uL   Hemoglobin 13.6 13.0 - 17.0 g/dL   HCT 39.4 39.0 - 52.0 %   MCV 100.8 (H) 78.0 - 100.0 fL   MCH 34.8 (H) 26.0 - 34.0 pg   MCHC 34.5 30.0 - 36.0 g/dL   RDW 14.1 11.5 - 15.5 %   Platelets 137 (L) 150 - 400 K/uL    ABGS  Recent Labs  10/19/15 0750  PHART 7.512*  PO2ART 94.6  HCO3 30.2*   CULTURES Recent Results (from the past 240 hour(s))  C difficile quick scan w PCR reflex     Status: None   Collection Time: 10/17/15 10:55 PM  Result Value Ref Range Status   C Diff antigen NEGATIVE NEGATIVE Final   C Diff toxin NEGATIVE NEGATIVE Final   C Diff interpretation Negative for toxigenic C. difficile  Final   Studies/Results: No results found.  Medications:  Prior to Admission:  Prescriptions prior to admission  Medication Sig Dispense Refill Last Dose  . ciprofloxacin (CILOXAN) 0.3 % ophthalmic solution Place 1 drop into the left eye every 4 (four) hours while awake.   0 10/14/2015 at Unknown time  . ELIQUIS 5 MG TABS tablet TAKE 1 TABLET BY MOUTH TWICE A DAY  12 10/14/2015 at 730  . escitalopram (LEXAPRO) 20 MG tablet Take 1 tablet (20 mg total) by mouth daily. 30 tablet 2 10/14/2015 at Unknown time  . furosemide (LASIX) 20 MG tablet Take 1 tablet (20 mg total) by mouth daily. 90 tablet 3 10/14/2015 at Unknown time  . Linaclotide (LINZESS) 145 MCG CAPS capsule 1 PO 30 mins prior to your first meal (Patient taking differently: Take 145 mcg by mouth daily. 1 PO 30 mins prior to your first meal) 30 capsule 11 10/14/2015 at Unknown time  . lisinopril (PRINIVIL,ZESTRIL) 10 MG tablet Take 1 tablet (10 mg total) by mouth daily. 30 tablet 12 10/14/2015 at Unknown time  . omeprazole (PRILOSEC) 20 MG  capsule Take 1 capsule (20 mg total) by mouth daily. 30 capsule  3 10/14/2015 at Unknown time  . potassium chloride SA (K-DUR,KLOR-CON) 20 MEQ tablet Take 1 tablet (20 mEq total) by mouth daily. 90 tablet 3 10/14/2015 at Unknown time  . psyllium (METAMUCIL) 58.6 % packet Take 1 packet by mouth daily.   10/14/2015 at Unknown time  . lidocaine-prilocaine (EMLA) cream Apply a quarter size amount to port site 1 hour prior to chemo. Do not rub in. Cover with plastic wrap. 30 g 3 10/12/2015  . nitroGLYCERIN (NITROGLYN) 2 % ointment Apply 0.5 inches topically as needed for chest pain. For rectal area   unknown   Scheduled: . apixaban  10 mg Oral BID  . [START ON 10/22/2015] apixaban  5 mg Oral BID  . ciprofloxacin  1 drop Left Eye Q4H while awake  . escitalopram  20 mg Oral Daily  . furosemide  40 mg Oral BID  . lisinopril  10 mg Oral Daily  . methylPREDNISolone (SOLU-MEDROL) injection  1,000 mg Intravenous Q24H  . pantoprazole  40 mg Oral Daily  . potassium chloride SA  40 mEq Oral Daily  . psyllium  1 packet Oral Daily  . sodium chloride  3 mL Intravenous Q12H   Continuous:  MBW:GYKZLD chloride, acetaminophen, ondansetron (ZOFRAN) IV, sodium chloride  Assesment: He was admitted with acute diastolic heart failure and that has improved. He had significant peripheral edema and that is improved. He had DVT and is on anticoagulation for that. He has colon cancer and had a fairly recent MRI of the brain that did not show metastatic disease but he is now much less responsive. Blood gas does not show evidence of obesity hypoventilation. He had abnormal LFTs on admission. He has indwelling Foley catheter so he needs to be checked for UTI. Principal Problem:   Acute diastolic CHF (congestive heart failure) (HCC) Active Problems:   Morbid obesity (HCC)   Colon carcinoma (HCC)   Peripheral edema   Generalized weakness   DVT (deep venous thrombosis) (HCC)   Atrial fibrillation (HCC)   Abnormal LFTs    Fecal incontinence    Plan: Although his MRI of the brain was fairly recent I'm going to have him go ahead and get CT of the brain with and without to make sure he doesn't have a stroke and/or metastatic disease. Extensive laboratory work. Urine and urine culture transfer to stepdown    LOS: 3 days   Ajene Carchi L 10/19/2015, 8:43 AM

## 2015-10-19 NOTE — Progress Notes (Signed)
Dr. Luan Pulling notified that all tests are back. MD stated he would contact Dr. [name] for a re-evaluation. Family updated.

## 2015-10-19 NOTE — Progress Notes (Signed)
Pt. Unresponsive to name call. Responds to painful stimuli. Wife at bedside. Dr. Luan Pulling paged.

## 2015-10-19 NOTE — Progress Notes (Signed)
Subjective: Patient found to be essentially unresponsive this morning by staff and transferred to ICU/stepdown. Spoke with previous nurse who stated the patient had 2 soft bowel movements from 7a-7p yesterday, nonenoted by night staff 7p-7a last night. Confirmed with family at bedside that his bowel movements have slowed, though they attribute this to decreased intake. No overt GI bleed noted. Limited history due to minimal responsiveness.  Objective: Vital signs in last 24 hours: Temp:  [97.5 F (36.4 C)-98.2 F (36.8 C)] 97.8 F (36.6 C) (10/20 1551) Pulse Rate:  [74-97] 76 (10/20 1600) Resp:  [9-16] 14 (10/20 1600) BP: (91-128)/(49-84) 128/80 mmHg (10/20 1600) SpO2:  [92 %-98 %] 96 % (10/20 1600) Weight:  [242 lb 8.1 oz (110 kg)-244 lb 0.8 oz (110.7 kg)] 242 lb 8.1 oz (110 kg) (10/20 0930) Last BM Date: 10/18/15 General:   Laying in bed, opens eyes, non-conversational. Family at bedside. Head:  Normocephalic and atraumatic. Eyes:  No icterus, sclera clear. Conjuctiva pink.  Abdomen:  Bowel sounds present, soft, non-distended. No HSM or hernias noted. No rebound or guarding. No masses appreciated  Neurologic:  Unresponsive and non-verbal at this time. Skin:  Warm and dry, intact without significant lesions.   Intake/Output from previous day: 10/19 0701 - 10/20 0700 In: 240 [P.O.:240] Out: 1250 [Urine:1250] Intake/Output this shift:    Lab Results:  Recent Labs  10/18/15 0641 10/19/15 0802  WBC 11.0* 11.9*  HGB 12.8* 13.6  HCT 37.1* 39.4  PLT 124* 137*   BMET  Recent Labs  10/17/15 0639 10/18/15 0641 10/19/15 1005  NA 139 137 139  K 3.6 3.6 4.2  CL 101 101 102  CO2 32 30 29  GLUCOSE 99 179* 152*  BUN 9 12 21*  CREATININE 0.97 0.82 0.95  CALCIUM 8.4* 8.6* 8.7*   LFT  Recent Labs  10/19/15 1005  PROT 6.8  ALBUMIN 2.4*  AST 77*  ALT 42  ALKPHOS 120  BILITOT 1.8*   PT/INR No results for input(s): LABPROT, INR in the last 72 hours. Hepatitis  Panel No results for input(s): HEPBSAG, HCVAB, HEPAIGM, HEPBIGM in the last 72 hours.   Studies/Results: Ct Head W Wo Contrast  10/19/2015  CLINICAL DATA:  Altered mental status. Decreased responsiveness since yesterday. Personal history of colon cancer this year. EXAM: CT HEAD WITHOUT AND WITH CONTRAST TECHNIQUE: Contiguous axial images were obtained from the base of the skull through the vertex without and with intravenous contrast CONTRAST:  63m OMNIPAQUE IOHEXOL 300 MG/ML  SOLN COMPARISON:  Normal MRI of the brain for age 78/05/2015. FINDINGS: No acute infarct, hemorrhage, or mass lesion is present. Basal ganglia are intact. Insular ribbon and cerebral cortex is within normal limits. Brainstem and cerebellum are unremarkable. The postcontrast images demonstrate no acute infarct or focal enhancing lesion. Ventricles are of normal size. No significant extra-axial fluid collection is present. The globes and orbits are intact. The paranasal sinuses and mastoid air cells are clear. Calvarium is within normal limits. IMPRESSION: Negative CT of the head without and with contrast. Electronically Signed   By: CSan MorelleM.D.   On: 10/19/2015 09:19    Assessment: 69y/o male with h/o Stage IIA adenocarcinoma of colon, s/p definitive surgery by Dr. JArnoldo Moraleon 04/03/15. Completed 7 cycles of adjuvant FOLFOX, per patient last cycle over six weeks ago. DID NOT require XRT. He presented with worsening lower extremity edema and subsequently diagnosed with right lower extremity DVT (had bee on Eliquis for 7 day for recent  colonoscopies). Suspected third spacing due to diastolic CHF.   Developed elevated bilirubin, alk phos, AST and thrombocytopenia in setting of chemo this year, so chemo held. U/S suggestive of fatty liver. CT 09/18/15 with no metastatic disease. But showed mild circumferential wall thickening in distal sigmoid and rectum. Mild associated perirectal edema/inflammation. Persistent marked  vascular collateralization in left abd and some nodularity to liver contour. Cannot exclude underlying chronic liver disease/cirrhosis as contributing factor to third spacing. Patient has diuresed 25 pounds this admission.   Patient with couple month h/o fecal incontinence in setting of loose stools. Recently suspected of spurious diarrhea with poor bowel preps at time of both colonoscopies. No improvement in symptoms on Linzess. Multiple loose stools. Patient has no urge for BM but is aware after soilage. No loss of bladder control. No abnormalities on recent MRI brain to explain. Wife reports no solid stools in months. DRE yesterday with brbpr with known anal fissures and verrucous anal lesion on recent TCS. No stool in rectal vault.   Stools have decreased innumber since the Linzess was held. Has had 2 bowel movements in the past 24 hours, which are still soft. GI pathogen panel is still pending. No further bowel movements in ICU per nursing staff and family. No signs of GI bleeding.   Plan: 1. Continue to hold Linzess 2. Follow for GI pathogen panel 3. At this point we will follow peripherally until his other more urgent needs are resolved.    Walden Field, AGNP-C Adult & Gerontological Nurse Practitioner [facility]     LOS: 3 days    10/19/2015, 5:00 PM

## 2015-10-19 NOTE — Care Management Note (Signed)
Case Management Note  Patient Details  Name: KIMON LOEWEN MRN: [id number] Date of Birth: July 25, 1946  Subjective/Objective:                    Action/Plan:   Expected Discharge Date:                  Expected Discharge Plan:  [place]  In-House Referral:  NA  Discharge planning Services  CM Consult  Post Acute Care Choice:  Durable Medical Equipment Choice offered to:  Patient  DME Arranged:  [place] [facility] DME Agency:  [place]:    [facility] Agency:     Status of Service:  In process, will continue to follow  Medicare Important Message Given:    Date Medicare IM Given:    Medicare IM give by:    Date Additional Medicare IM Given:    Additional Medicare Important Message give by:     If discussed at [place] of Stay Meetings, dates discussed:    Additional Comments: Pt transferred to stepdown. Will continue to follow for discharge planning needs. Christinia Gully [place], RN 10/19/2015, 8:57 AM

## 2015-10-19 NOTE — Progress Notes (Signed)
PT Cancellation Note  Patient Details Name: [name] MRN: [id number] DOB: 1946/09/01   Cancelled Treatment:    Reason Eval/Treat Not Completed: Medical issues which prohibited therapy.  Pt has has a decline in medical status and moved to ICU.  We will have to d/c current orders and request that it be reordered when medically appropriate.  Thanks.   [name]  PT 10/19/2015, 1:44 PM [phone]

## 2015-10-20 ENCOUNTER — Inpatient Hospital Stay (HOSPITAL_COMMUNITY)
Admission: RE | Admit: 2015-10-20 | Discharge: 2015-10-20 | Disposition: A | Discharge status: Home / Self Care | Payer: Medicare Other | Source: Ambulatory Visit | Attending: Neurology | Admitting: Neurology

## 2015-10-20 ENCOUNTER — Inpatient Hospital Stay (HOSPITAL_COMMUNITY): Payer: Medicare Other

## 2015-10-20 ENCOUNTER — Other Ambulatory Visit (HOSPITAL_COMMUNITY): Payer: Medicare Other

## 2015-10-20 ENCOUNTER — Ambulatory Visit: Payer: Medicare Other | Admitting: Adult Health

## 2015-10-20 LAB — CBC WITH DIFFERENTIAL/PLATELET
BASOS ABS: 0 10*3/uL (ref 0.0–0.1)
BASOS PCT: 0 %
EOS ABS: 0 10*3/uL (ref 0.0–0.7)
Eosinophils Relative: 0 %
HEMATOCRIT: 39.7 % (ref 39.0–52.0)
HEMOGLOBIN: 13.2 g/dL (ref 13.0–17.0)
Lymphocytes Relative: 28 %
Lymphs Abs: 2.1 10*3/uL (ref 0.7–4.0)
MCH: 34.5 pg — ABNORMAL HIGH (ref 26.0–34.0)
MCHC: 33.2 g/dL (ref 30.0–36.0)
MCV: 103.7 fL — ABNORMAL HIGH (ref 78.0–100.0)
MONOS PCT: 12 %
Monocytes Absolute: 0.9 10*3/uL (ref 0.1–1.0)
NEUTROS ABS: 4.3 10*3/uL (ref 1.7–7.7)
NEUTROS PCT: 60 %
Platelets: 123 10*3/uL — ABNORMAL LOW (ref 150–400)
RBC: 3.83 MIL/uL — AB (ref 4.22–5.81)
RDW: 14.2 % (ref 11.5–15.5)
WBC: 7.3 10*3/uL (ref 4.0–10.5)

## 2015-10-20 LAB — GI PATHOGEN PANEL BY PCR, STOOL
CAMPYLOBACTER BY PCR: NOT DETECTED
Cryptosporidium by PCR: NOT DETECTED
E COLI 0157 BY PCR: NOT DETECTED
E coli (ETEC) LT/ST: NOT DETECTED
E coli (STEC): NOT DETECTED
G LAMBLIA BY PCR: NOT DETECTED
NOROVIRUS G1/G2: NOT DETECTED
ROTAVIRUS A BY PCR: NOT DETECTED
SALMONELLA BY PCR: NOT DETECTED
SHIGELLA BY PCR: NOT DETECTED

## 2015-10-20 MED ORDER — LEVETIRACETAM IN NACL 1000 MG/100ML IV SOLN
1000.0000 mg | Freq: Once | INTRAVENOUS | Status: AC
  Administered 2015-10-20: 1000 mg via INTRAVENOUS
  Filled 2015-10-20: qty 100

## 2015-10-20 MED ORDER — LEVOFLOXACIN IN D5W 750 MG/150ML IV SOLN
750.0000 mg | INTRAVENOUS | Status: DC
  Administered 2015-10-20 – 2015-10-21 (×2): 750 mg via INTRAVENOUS
  Filled 2015-10-20: qty 150

## 2015-10-20 MED ORDER — DEXTROSE-NACL 5-0.9 % IV SOLN
INTRAVENOUS | Status: DC
  Administered 2015-10-20: 18:00:00 via INTRAVENOUS

## 2015-10-20 MED ORDER — LEVOFLOXACIN IN D5W 750 MG/150ML IV SOLN
INTRAVENOUS | Status: AC
  Filled 2015-10-20: qty 150

## 2015-10-20 MED ORDER — PANTOPRAZOLE SODIUM 40 MG IV SOLR
40.0000 mg | Freq: Every day | INTRAVENOUS | Status: DC
  Administered 2015-10-21: 40 mg via INTRAVENOUS
  Filled 2015-10-20: qty 40

## 2015-10-20 MED ORDER — LEVETIRACETAM IN NACL 500 MG/100ML IV SOLN
500.0000 mg | Freq: Two times a day (BID) | INTRAVENOUS | Status: DC
  Administered 2015-10-21 – 2015-10-23 (×5): 500 mg via INTRAVENOUS
  Filled 2015-10-20 (×7): qty 100

## 2015-10-20 NOTE — Care Management Note (Signed)
Case Management Note  Patient Details  Name: MARITZA GOLDSBOROUGH MRN: [id number] Date of Birth: 1946-01-06   Expected Discharge Date:                  Expected Discharge Plan:  [place]  In-House Referral:  NA  Discharge planning Services  CM Consult  Post Acute Care Choice:  Durable Medical Equipment Choice offered to:  Patient  DME Arranged:  [facility] rolling DME Agency:  [place]:    [facility] Agency:     Status of Service:  In process, will continue to follow  Medicare Important Message Given:  Yes-second notification given Date Medicare IM Given:    Medicare IM give by:    Date Additional Medicare IM Given:    Additional Medicare Important Message give by:     If discussed at [place] of Stay Meetings, dates discussed:    Additional Comments: Pt transferred to SDU decreased responsiveness on 10/19/2015. Will cont to follow for DC planning.  Sherald Barge, RN 10/20/2015, 9:05 AM

## 2015-10-20 NOTE — Progress Notes (Signed)
[name], male   DOB: [birth year], 69 y.o.   MRN: [id number]  Peter A. [name], MD     [email or web address]          [name] is an 69 y.o. male.     ASSESSMENT/PLAN: Imaging has ruled out severe intracranial catastrophe as a stroke or hemorrhage. He does appears to have a developing urinary tract infection. EEG is also concerning. Consequently, the patient has been started on Keppra. Levaquin is also initiated.  Multifactorial gait impairment including osteoarthritis and the radiographic evidence of cord encroachment. No other neurological causes are uncovered. As noted Overtly draumatic evidence of myelopathy although he does have the MRI findings of the cervical spine. There is no evidence of neuropathy or parkinsonism.   Chronic atrial fibrillation on chronic anticoagulation.  Congestive heart failure.   It appears that the patient has improved modestly.    GENERAL: Pleasant obese man in no acute distress.  HEENT: Supple. Atraumatic normocephalic.   ABDOMEN: soft  EXTREMITIES: One plus pitting edema of the distal legs and ankles. Marked arthritic changes of the knees.   BACK: Normal.  SKIN: Normal by inspection.   MENTAL STATUS: He is awake and alert today. He responds by saying he is okay. Speech is very limited to discuss yes and no and doing okay. He does follow midline commands briskly. He also follows appendicular commands although less consistently. He is not oriented.  CRANIAL NERVES: Pupils are equal, round and reactive to light; extra ocular movements are full, there is no significant nystagmus; visual fields are full; upper and lower facial muscles are normal in strength and symmetric, there is no flattening of the nasolabial folds.  MOTOR: He moves both sides to command.  COORDINATION: Occasional twitching /Tremor right upper extremity. There is some rigidity also in the right upper  extremity.  REFLEXES: Deep tendon reflexes are symmetrical and normal.   The cervical spine MRI is reviewed in person. There is disc osteophyte complex at the C4-C5 and also C5-C6 levels. Both areas are associated with significant encroachment on the spinal cord with a marked reduction in spinal fluid at these levels especially at the C5-C6 level. No intrinsic cord lesions are observed.   Blood pressure 123/74, pulse 84, temperature 98.3 F (36.8 C), temperature source Oral, resp. rate 11, height _0  (1.702 m), weight 109.8 kg (242 lb 1 oz), SpO2 95 %.  Past Medical History  Diagnosis Date  . Varicose veins   . New onset atrial fibrillation ([place]) 01/02/2015  . Morbid obesity ([place]) 01/02/2015  . Hypertension   . Sleep apnea     been tested but has not received the CPAP yet  . GERD (gastroesophageal reflux disease)   . History of gout   . Cancer ([place]) 02/2015    colon  . Family history of colon cancer   . Family history of breast cancer in mother     Past Surgical History  Procedure Laterality Date  . Vein ligation and stripping      left leg  . Colonoscopy N/A 03/28/2015    SLF: 1. Abdominal pain, diarrhea, rectal bleeding due to obstructing colon mass  . Esophagogastroduodenoscopy N/A 03/28/2015    SLF: 1. dysphagia 2. Mild non-erosive gastritis.   . Esophageal dilation N/A 03/28/2015    Procedure: ESOPHAGEAL DILATION;  Surgeon: [name], MD;  Location[facility] ENDO SU[facility];  Service: Endoscopy;  Laterality: N/A;  . Partial colectomy N/A 04/03/2015  Procedure: PARTIAL COLECTOMY;  Surgeon: [name], MD;  Location: [facility] ORS;  Service: General;  Laterality: N/A;  . Portacath placement N/A 05/10/2015    Procedure: INSERTION PORT-A-CATH;  Surgeon: [name], MD;  Location: [facility] ORS;  Service: General;  Laterality: N/A;  left subclavian  . Skin cancer destruction Left 08/16/15    left side of nose and left back  . Colonoscopy N/A 10/03/2015    SLF: normal anastomosis, fair prep  (polyps less than 1cm could be missed), anal fissures and verrucous anal canal lesion with benign biopsy. Next TCS in 09/2016.  . Colonoscopy N/A 10/02/2015    SLF: normal anastomosis, poor bowel prep, three anal fissures    Family History  Problem Relation Age of Onset  . Breast cancer Mother     dx under 52  . Colon cancer Mother     dx in her mid 2s  . Heart disease Father   . Hyperlipidemia Father   . Liver cancer Father     heavy ETOH user when young  . Cancer Sister     slow "blood" cancer  . Diabetes Sister   . Diabetes Brother   . Cancer Maternal Aunt     2-3 maternal aunts with Cancer NOS  . Lung cancer Paternal Uncle     three uncles with lung cancer - all smokers  . Cancer Paternal Grandmother     possible bone cancer    Social History:  reports that he has never smoked. He has never used smokeless tobacco. He reports that he does not drink alcohol or use illicit drugs.  Allergies: No Known Allergies  Medications: Prior to Admission medications   Medication Sig Start Date End Date Taking? Authorizing Provider  ciprofloxacin (CILOXAN) 0.3 % ophthalmic solution Place 1 drop into the left eye every 4 (four) hours while awake.  08/22/15  Yes Historical Provider, MD  ELIQUIS 5 MG TABS tablet TAKE 1 TABLET BY MOUTH TWICE A DAY 08/16/15  Yes Historical Provider, MD  escitalopram (LEXAPRO) 20 MG tablet Take 1 tablet (20 mg total) by mouth daily. 07/17/15  Yes [name], PA-C  furosemide (LASIX) 20 MG tablet Take 1 tablet (20 mg total) by mouth daily. 10/13/15  Yes [name], MD  Linaclotide [facility]) 145 MCG CAPS capsule 1 PO 30 mins prior to your first meal Patient taking differently: Take 145 mcg by mouth daily. 1 PO 30 mins prior to your first meal 10/03/15  Yes [name], MD  lisinopril (PRINIVIL,ZESTRIL) 10 MG tablet Take 1 tablet (10 mg total) by mouth daily. 01/04/15  Yes [name], MD  omeprazole (PRILOSEC) 20 MG capsule Take 1 capsule (20 mg  total) by mouth daily. 03/15/15  Yes [name], PA-C  potassium chloride SA (K-DUR,KLOR-CON) 20 MEQ tablet Take 1 tablet (20 mEq total) by mouth daily. 10/13/15  Yes [name], MD  psyllium (METAMUCIL) 58.6 % packet Take 1 packet by mouth daily.   Yes Historical Provider, MD  lidocaine-prilocaine (EMLA) cream Apply a quarter size amount to port site 1 hour prior to chemo. Do not rub in. Cover with plastic wrap. 04/28/15   [name], MD  nitroGLYCERIN (NITROGLYN) 2 % ointment Apply 0.5 inches topically as needed for chest pain. For rectal area    Historical Provider, MD    Scheduled Meds: . ciprofloxacin  1 drop Left Eye Q4H while awake  . enoxaparin (LOVENOX) injection  110 mg Subcutaneous Q12H  .  escitalopram  20 mg Oral Daily  . [START ON 10/21/2015] levETIRAcetam  500 mg Intravenous Q12H  . levofloxacin (LEVAQUIN) IV  750 mg Intravenous Q24H  . lisinopril  10 mg Oral Daily  . [START ON 10/21/2015] pantoprazole (PROTONIX) IV  40 mg Intravenous QAC breakfast  . potassium chloride SA  40 mEq Oral Daily  . sodium chloride  3 mL Intravenous Q12H   Continuous Infusions: . dextrose 5 % and 0.9% NaCl 50 mL/hr at 10/20/15 2100   PRN Meds:.sodium chloride, acetaminophen, ondansetron (ZOFRAN) IV, sodium chloride     Results for orders placed or performed during the hospital encounter of 10/15/15 (from the past 48 hour(s))  Blood gas, arterial     Status: Abnormal   Collection Time: 10/19/15  7:50 AM  Result Value Ref Range   FIO2 0.21    pH, Arterial 7.512 (H) 7.350 - 7.450   pCO2 arterial 36.9 35.0 - 45.0 mmHg   pO2, Arterial 94.6 80.0 - 100.0 mmHg   Bicarbonate 30.2 (H) 20.0 - 24.0 mEq/L   Acid-Base Excess 6.1 (H) 0.0 - 2.0 mmol/L   O2 Saturation 97.7 %   Patient temperature 97.5    Collection site LEFT RADIAL    Drawn by [id number]    Sample type ARTERIAL DRAW    Allens test (pass/fail) PASS PASS  CBC     Status: Abnormal   Collection Time: 10/19/15  8:02 AM   Result Value Ref Range   WBC 11.9 (H) 4.0 - 10.5 K/uL   RBC 3.91 (L) 4.22 - 5.81 MIL/uL   Hemoglobin 13.6 13.0 - 17.0 g/dL   HCT 39.4 39.0 - 52.0 %   MCV 100.8 (H) 78.0 - 100.0 fL   MCH 34.8 (H) 26.0 - 34.0 pg   MCHC 34.5 30.0 - 36.0 g/dL   RDW 14.1 11.5 - 15.5 %   Platelets 137 (L) 150 - 400 K/uL  Glucose, capillary     Status: Abnormal   Collection Time: 10/19/15  9:41 AM  Result Value Ref Range   Glucose-Capillary 142 (H) 65 - 99 mg/dL   Comment 1 Notify RN    Comment 2 Document in Chart   Culture, Urine     Status: None (Preliminary result)   Collection Time: 10/19/15  9:47 AM  Result Value Ref Range   Specimen Description URINE, CLEAN CATCH    Special Requests NONE    Culture      >=100,000 COLONIES/mL ENTEROCOCCUS SPECIES Performed at [facility]    Report Status PENDING   Urinalysis, Routine w reflex microscopic (not at [facility])     Status: Abnormal   Collection Time: 10/19/15  9:47 AM  Result Value Ref Range   Color, Urine YELLOW YELLOW   APPearance CLEAR CLEAR   Specific Gravity, Urine 1.010 1.005 - 1.030   pH 6.5 5.0 - 8.0   Glucose, UA NEGATIVE NEGATIVE mg/dL   Hgb urine dipstick SMALL (A) NEGATIVE   Bilirubin Urine NEGATIVE NEGATIVE   Ketones, ur NEGATIVE NEGATIVE mg/dL   Protein, ur NEGATIVE NEGATIVE mg/dL   Urobilinogen, UA 1.0 0.0 - 1.0 mg/dL   Nitrite NEGATIVE NEGATIVE   Leukocytes, UA NEGATIVE NEGATIVE  MRSA PCR Screening     Status: None   Collection Time: 10/19/15  9:47 AM  Result Value Ref Range   MRSA by PCR NEGATIVE NEGATIVE    Comment:        The GeneXpert MRSA Assay (FDA approved for NASAL specimens only), is one component  of a comprehensive MRSA colonization surveillance program. It is not intended to diagnose MRSA infection nor to guide or monitor treatment for MRSA infections.   Urine microscopic-add on     Status: None   Collection Time: 10/19/15  9:47 AM  Result Value Ref Range   RBC / HPF 0-2 <3 RBC/hpf  Comprehensive  metabolic panel     Status: Abnormal   Collection Time: 10/19/15 10:05 AM  Result Value Ref Range   Sodium 139 135 - 145 mmol/L   Potassium 4.2 3.5 - 5.1 mmol/L   Chloride 102 101 - 111 mmol/L   CO2 29 22 - 32 mmol/L   Glucose, Bld 152 (H) 65 - 99 mg/dL   BUN 21 (H) 6 - 20 mg/dL   Creatinine, Ser 0.95 0.61 - 1.24 mg/dL   Calcium 8.7 (L) 8.9 - 10.3 mg/dL   Total Protein 6.8 6.5 - 8.1 g/dL   Albumin 2.4 (L) 3.5 - 5.0 g/dL   AST 77 (H) 15 - 41 U/L   ALT 42 17 - 63 U/L   Alkaline Phosphatase 120 38 - 126 U/L   Total Bilirubin 1.8 (H) 0.3 - 1.2 mg/dL   GFR calc non Af Amer >60 >60 mL/min   GFR calc Af Amer >60 >60 mL/min    Comment: (NOTE) The eGFR has been calculated using the CKD EPI equation. This calculation has not been validated in all clinical situations. eGFR's persistently <60 mL/min signify possible Chronic Kidney Disease.    Anion gap 8 5 - 15  Troponin I (q 6hr x 3)     Status: None   Collection Time: 10/19/15 10:05 AM  Result Value Ref Range   Troponin I <0.03 <0.031 ng/mL    Comment:        NO INDICATION OF MYOCARDIAL INJURY.   Ammonia     Status: None   Collection Time: 10/19/15 10:05 AM  Result Value Ref Range   Ammonia 34 9 - 35 umol/L  Troponin I (q 6hr x 3)     Status: None   Collection Time: 10/19/15  4:00 PM  Result Value Ref Range   Troponin I <0.03 <0.031 ng/mL    Comment:        NO INDICATION OF MYOCARDIAL INJURY.   Troponin I (q 6hr x 3)     Status: None   Collection Time: 10/19/15  9:39 PM  Result Value Ref Range   Troponin I <0.03 <0.031 ng/mL    Comment:        NO INDICATION OF MYOCARDIAL INJURY.   CBC with Differential/Platelet     Status: Abnormal   Collection Time: 10/20/15  8:13 PM  Result Value Ref Range   WBC 7.3 4.0 - 10.5 K/uL   RBC 3.83 (L) 4.22 - 5.81 MIL/uL   Hemoglobin 13.2 13.0 - 17.0 g/dL   HCT 39.7 39.0 - 52.0 %   MCV 103.7 (H) 78.0 - 100.0 fL   MCH 34.5 (H) 26.0 - 34.0 pg   MCHC 33.2 30.0 - 36.0 g/dL   RDW 14.2  11.5 - 15.5 %   Platelets 123 (L) 150 - 400 K/uL   Neutrophils Relative % 60 %   Neutro Abs 4.3 1.7 - 7.7 K/uL   Lymphocytes Relative 28 %   Lymphs Abs 2.1 0.7 - 4.0 K/uL   Monocytes Relative 12 %   Monocytes Absolute 0.9 0.1 - 1.0 K/uL   Eosinophils Relative 0 %   Eosinophils Absolute 0.0 0.0 -  0.7 K/uL   Basophils Relative 0 %   Basophils Absolute 0.0 0.0 - 0.1 K/uL    Studies/Results:   Repeat head CT scan with contrast shows no acute changes and essentially is unrevealing.  Clint Biello A. Merlene Beasley, M.D.  Diplomate, Tax adviser of Psychiatry and Neurology ( Neurology). 10/20/2015, 10:14 PM

## 2015-10-20 NOTE — Care Management Note (Signed)
Case Management Note  Patient Details  Name: [name] MRN: [id number] Date of Birth: [birth year]  Expected Discharge Date:                  Expected Discharge Plan:  [place]  In-House Referral:  NA  Discharge planning Services  CM Consult  Post Acute Care Choice:  Durable Medical Equipment Choice offered to:  Patient  DME Arranged:  [place] rolling DME Agency:  [place] Arranged:  RN, PT [facility] Agency:  [place]  Status of Service:  In process, will continue to follow  Medicare Important Message Given:  Yes-second notification given Date Medicare IM Given:    Medicare IM give by:    Date Additional Medicare IM Given:    Additional Medicare Important Message give by:     If discussed at [place] of Stay Meetings, dates discussed:    Additional Comments: If will have [place] services through [facility], as previous arranged per pt choice. Romualdo Bolk, of [facility] aware of referral and will obtain pt info from chart. If pt discharges over weekend, RN will notify [facility] that pt has left hospital. DC over weekend not anticipated.  Sherald Barge, RN 10/20/2015, 9:11 AM

## 2015-10-20 NOTE — Care Management Important Message (Signed)
Important Message  Patient Details  Name: RAMIREZ FULLBRIGHT MRN: [id number] Date of Birth: 05/22/1946   Medicare Important Message Given:  Yes-second notification given    Sherald Barge, RN 10/20/2015, 9:05 AM

## 2015-10-20 NOTE — Progress Notes (Signed)
EEG Completed; Results Pending  

## 2015-10-20 NOTE — Progress Notes (Signed)
ANTICOAGULATION CONSULT NOTE - follow up  Pharmacy Consult for Lovenox Indication: atrial fibrillation / DVT  No Known Allergies  Patient Measurements: Height: 5\' 7"  (170.2 cm) Weight: 242 lb 1 oz (109.8 kg) IBW/kg (Calculated) : 66.1  Vital Signs: Temp: 98.2 F (36.8 C) (10/21 0420) Temp Source: Axillary (10/21 0420) BP: 123/70 mmHg (10/21 0700) Pulse Rate: 72 (10/21 0700)  Labs:  Recent Labs  10/18/15 0641 10/19/15 0802 10/19/15 1005 10/19/15 1600 10/19/15 2139  HGB 12.8* 13.6  --   --   --   HCT 37.1* 39.4  --   --   --   PLT 124* 137*  --   --   --   CREATININE 0.82  --  0.95  --   --   TROPONINI  --   --  <0.03 <0.03 <0.03   Estimated Creatinine Clearance: 88 mL/min (by C-G formula based on Cr of 0.95).  Medical History: Past Medical History  Diagnosis Date  . Varicose veins   . New onset atrial fibrillation ([place]) 01/02/2015  . Morbid obesity ([place]) 01/02/2015  . Hypertension   . Sleep apnea     been tested but has not received the CPAP yet  . GERD (gastroesophageal reflux disease)   . History of gout   . Cancer ([place]) 02/2015    colon  . Family history of colon cancer   . Family history of breast cancer in mother    Assessment: 69yo morbidly obese male, was on Eliquis for atrial fibrillation and DVT.  Pt had change in mental status and is now unresponsive and transferred to ICU.  Asked to switch to Lovenox.  No bleeding noted.    Goal of Therapy:  Anti-Xa level 0.6-1 units/ml 4hrs after LMWH dose given Monitor platelets by anticoagulation protocol: Yes   Plan:  Lovenox 1mg /Kg SQ q12hrs (110mg ) Monitor CBC and s/sx of bleeding  [place], Bonita Brindisi A 10/20/2015,9:16 AM

## 2015-10-20 NOTE — Progress Notes (Signed)
Subjective: He is a little more responsive this morning. They help from Dr. [name] is noted and appreciated  Objective: Vital signs in last 24 hours: Temp:  [97.5 F (36.4 C)-98.2 F (36.8 C)] 98.2 F (36.8 C) (10/21 0420) Pulse Rate:  [72-92] 72 (10/21 0700) Resp:  [9-18] 15 (10/21 0700) BP: (91-139)/(49-100) 123/70 mmHg (10/21 0700) SpO2:  [92 %-97 %] 94 % (10/21 0700) Weight:  [109.77 kg (242 lb)-110 kg (242 lb 8.1 oz)] 109.77 kg (242 lb) (10/21 0819) Weight change: -0.7 kg (-1 lb 8.7 oz) Last BM Date: 10/19/15  Intake/Output from previous day: 10/20 0701 - 10/21 0700 In: -  Out: [place] appearance: He is certainly more responsive than yesterday and he is actually moving around a little bit. His family says that he spoke some last night Resp: clear to auscultation bilaterally Cardio: irregularly irregular rhythm GI: soft, non-tender; bowel sounds normal; no masses,  no organomegaly Extremities: Much less edema  Lab Results:  Results for orders placed or performed during the hospital encounter of 10/15/15 (from the past 48 hour(s))  Blood gas, arterial     Status: Abnormal   Collection Time: 10/19/15  7:50 AM  Result Value Ref Range   FIO2 0.21    pH, Arterial 7.512 (H) 7.350 - 7.450   pCO2 arterial 36.9 35.0 - 45.0 mmHg   pO2, Arterial 94.6 80.0 - 100.0 mmHg   Bicarbonate 30.2 (H) 20.0 - 24.0 mEq/L   Acid-Base Excess 6.1 (H) 0.0 - 2.0 mmol/L   O2 Saturation 97.7 %   Patient temperature 97.5    Collection site LEFT RADIAL    Drawn by [id number]    Sample type ARTERIAL DRAW    Allens test (pass/fail) PASS PASS  CBC     Status: Abnormal   Collection Time: 10/19/15  8:02 AM  Result Value Ref Range   WBC 11.9 (H) 4.0 - 10.5 K/uL   RBC 3.91 (L) 4.22 - 5.81 MIL/uL   Hemoglobin 13.6 13.0 - 17.0 g/dL   HCT 39.4 39.0 - 52.0 %   MCV 100.8 (H) 78.0 - 100.0 fL   MCH 34.8 (H) 26.0 - 34.0 pg   MCHC 34.5 30.0 - 36.0 g/dL   RDW 14.1 11.5 - 15.5  %   Platelets 137 (L) 150 - 400 K/uL  Glucose, capillary     Status: Abnormal   Collection Time: 10/19/15  9:41 AM  Result Value Ref Range   Glucose-Capillary 142 (H) 65 - 99 mg/dL   Comment 1 Notify RN    Comment 2 Document in Chart   Urinalysis, Routine w reflex microscopic (not at [facility])     Status: Abnormal   Collection Time: 10/19/15  9:47 AM  Result Value Ref Range   Color, Urine YELLOW YELLOW   APPearance CLEAR CLEAR   Specific Gravity, Urine 1.010 1.005 - 1.030   pH 6.5 5.0 - 8.0   Glucose, UA NEGATIVE NEGATIVE mg/dL   Hgb urine dipstick SMALL (A) NEGATIVE   Bilirubin Urine NEGATIVE NEGATIVE   Ketones, ur NEGATIVE NEGATIVE mg/dL   Protein, ur NEGATIVE NEGATIVE mg/dL   Urobilinogen, UA 1.0 0.0 - 1.0 mg/dL   Nitrite NEGATIVE NEGATIVE   Leukocytes, UA NEGATIVE NEGATIVE  MRSA PCR Screening     Status: None   Collection Time: 10/19/15  9:47 AM  Result Value Ref Range   MRSA by PCR NEGATIVE NEGATIVE    Comment:        The GeneXpert  MRSA Assay (FDA approved for NASAL specimens only), is one component of a comprehensive MRSA colonization surveillance program. It is not intended to diagnose MRSA infection nor to guide or monitor treatment for MRSA infections.   Urine microscopic-add on     Status: None   Collection Time: 10/19/15  9:47 AM  Result Value Ref Range   RBC / HPF 0-2 <3 RBC/hpf  Comprehensive metabolic panel     Status: Abnormal   Collection Time: 10/19/15 10:05 AM  Result Value Ref Range   Sodium 139 135 - 145 mmol/L   Potassium 4.2 3.5 - 5.1 mmol/L   Chloride 102 101 - 111 mmol/L   CO2 29 22 - 32 mmol/L   Glucose, Bld 152 (H) 65 - 99 mg/dL   BUN 21 (H) 6 - 20 mg/dL   Creatinine, Ser 0.95 0.61 - 1.24 mg/dL   Calcium 8.7 (L) 8.9 - 10.3 mg/dL   Total Protein 6.8 6.5 - 8.1 g/dL   Albumin 2.4 (L) 3.5 - 5.0 g/dL   AST 77 (H) 15 - 41 U/L   ALT 42 17 - 63 U/L   Alkaline Phosphatase 120 38 - 126 U/L   Total Bilirubin 1.8 (H) 0.3 - 1.2 mg/dL   GFR calc non  Af Amer >60 >60 mL/min   GFR calc Af Amer >60 >60 mL/min    Comment: (NOTE) The eGFR has been calculated using the CKD EPI equation. This calculation has not been validated in all clinical situations. eGFR's persistently <60 mL/min signify possible Chronic Kidney Disease.    Anion gap 8 5 - 15  Troponin I (q 6hr x 3)     Status: None   Collection Time: 10/19/15 10:05 AM  Result Value Ref Range   Troponin I <0.03 <0.031 ng/mL    Comment:        NO INDICATION OF MYOCARDIAL INJURY.   Ammonia     Status: None   Collection Time: 10/19/15 10:05 AM  Result Value Ref Range   Ammonia 34 9 - 35 umol/L  Troponin I (q 6hr x 3)     Status: None   Collection Time: 10/19/15  4:00 PM  Result Value Ref Range   Troponin I <0.03 <0.031 ng/mL    Comment:        NO INDICATION OF MYOCARDIAL INJURY.   Troponin I (q 6hr x 3)     Status: None   Collection Time: 10/19/15  9:39 PM  Result Value Ref Range   Troponin I <0.03 <0.031 ng/mL    Comment:        NO INDICATION OF MYOCARDIAL INJURY.     ABGS  Recent Labs  10/19/15 0750  PHART 7.512*  PO2ART 94.6  HCO3 30.2*   CULTURES Recent Results (from the past 240 hour(s))  C difficile quick scan w PCR reflex     Status: None   Collection Time: 10/17/15 10:55 PM  Result Value Ref Range Status   C Diff antigen NEGATIVE NEGATIVE Final   C Diff toxin NEGATIVE NEGATIVE Final   C Diff interpretation Negative for toxigenic C. difficile  Final  MRSA PCR Screening     Status: None   Collection Time: 10/19/15  9:47 AM  Result Value Ref Range Status   MRSA by PCR NEGATIVE NEGATIVE Final    Comment:        The GeneXpert MRSA Assay (FDA approved for NASAL specimens only), is one component of a comprehensive MRSA colonization surveillance program. It  is not intended to diagnose MRSA infection nor to guide or monitor treatment for MRSA infections.    Studies/Results: Ct Head W Wo Contrast  10/19/2015  CLINICAL DATA:  Altered mental  status. Decreased responsiveness since yesterday. Personal history of colon cancer this year. EXAM: CT HEAD WITHOUT AND WITH CONTRAST TECHNIQUE: Contiguous axial images were obtained from the base of the skull through the vertex without and with intravenous contrast CONTRAST:  33m OMNIPAQUE IOHEXOL 300 MG/ML  SOLN COMPARISON:  Normal MRI of the brain for age 04/05/2015. FINDINGS: No acute infarct, hemorrhage, or mass lesion is present. Basal ganglia are intact. Insular ribbon and cerebral cortex is within normal limits. Brainstem and cerebellum are unremarkable. The postcontrast images demonstrate no acute infarct or focal enhancing lesion. Ventricles are of normal size. No significant extra-axial fluid collection is present. The globes and orbits are intact. The paranasal sinuses and mastoid air cells are clear. Calvarium is within normal limits. IMPRESSION: Negative CT of the head without and with contrast. Electronically Signed   By: CSan MorelleM.D.   On: 10/19/2015 09:19    Medications:  Prior to Admission:  Prescriptions prior to admission  Medication Sig Dispense Refill Last Dose  . ciprofloxacin (CILOXAN) 0.3 % ophthalmic solution Place 1 drop into the left eye every 4 (four) hours while awake.   0 10/14/2015 at Unknown time  . ELIQUIS 5 MG TABS tablet TAKE 1 TABLET BY MOUTH TWICE A DAY  12 10/14/2015 at 730  . escitalopram (LEXAPRO) 20 MG tablet Take 1 tablet (20 mg total) by mouth daily. 30 tablet 2 10/14/2015 at Unknown time  . furosemide (LASIX) 20 MG tablet Take 1 tablet (20 mg total) by mouth daily. 90 tablet 3 10/14/2015 at Unknown time  . Linaclotide (LINZESS) 145 MCG CAPS capsule 1 PO 30 mins prior to your first meal (Patient taking differently: Take 145 mcg by mouth daily. 1 PO 30 mins prior to your first meal) 30 capsule 11 10/14/2015 at Unknown time  . lisinopril (PRINIVIL,ZESTRIL) 10 MG tablet Take 1 tablet (10 mg total) by mouth daily. 30 tablet 12 10/14/2015 at Unknown  time  . omeprazole (PRILOSEC) 20 MG capsule Take 1 capsule (20 mg total) by mouth daily. 30 capsule 3 10/14/2015 at Unknown time  . potassium chloride SA (K-DUR,KLOR-CON) 20 MEQ tablet Take 1 tablet (20 mEq total) by mouth daily. 90 tablet 3 10/14/2015 at Unknown time  . psyllium (METAMUCIL) 58.6 % packet Take 1 packet by mouth daily.   10/14/2015 at Unknown time  . lidocaine-prilocaine (EMLA) cream Apply a quarter size amount to port site 1 hour prior to chemo. Do not rub in. Cover with plastic wrap. 30 g 3 10/12/2015  . nitroGLYCERIN (NITROGLYN) 2 % ointment Apply 0.5 inches topically as needed for chest pain. For rectal area   unknown   Scheduled: . ciprofloxacin  1 drop Left Eye Q4H while awake  . enoxaparin (LOVENOX) injection  110 mg Subcutaneous Q12H  . escitalopram  20 mg Oral Daily  . lisinopril  10 mg Oral Daily  . pantoprazole  40 mg Oral Daily  . potassium chloride SA  40 mEq Oral Daily  . psyllium  1 packet Oral Daily  . sodium chloride  3 mL Intravenous Q12H   Continuous:  PCBU:LAGTXMchloride, acetaminophen, ondansetron (ZOFRAN) IV, sodium chloride  Assesment: He has altered mental status. It is not totally clear what the cause of this is but it appears that it may be from a stroke.  He is sent for MRI of the brain and EEG today. He has improved somewhat.  He has atrial fibrillation and is chronically anticoagulated. He had DVT in his leg. He had acute diastolic heart failure which is markedly improved.  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    LOS: 4 days    [name] Hinde L 10/20/2015, 8:58 AM

## 2015-10-20 NOTE — Procedures (Signed)
  Peter A. [name], MD     [email or web address]           HISTORY: The patient has acute encephalopathy also mental status. This study does evaluate for seizures as a cause of these events.  MEDICATIONS: Scheduled Meds: . ciprofloxacin  1 drop Left Eye Q4H while awake  . enoxaparin (LOVENOX) injection  110 mg Subcutaneous Q12H  . escitalopram  20 mg Oral Daily  . lisinopril  10 mg Oral Daily  . pantoprazole  40 mg Oral Daily  . potassium chloride SA  40 mEq Oral Daily  . psyllium  1 packet Oral Daily  . sodium chloride  3 mL Intravenous Q12H   Continuous Infusions:  PRN Meds:.sodium chloride, acetaminophen, ondansetron (ZOFRAN) IV, sodium chloride  Prior to Admission medications   Medication Sig Start Date End Date Taking? Authorizing Provider  ciprofloxacin (CILOXAN) 0.3 % ophthalmic solution Place 1 drop into the left eye every 4 (four) hours while awake.  08/22/15  Yes Historical Provider, MD  ELIQUIS 5 MG TABS tablet TAKE 1 TABLET BY MOUTH TWICE A DAY 08/16/15  Yes Historical Provider, MD  escitalopram (LEXAPRO) 20 MG tablet Take 1 tablet (20 mg total) by mouth daily. 07/17/15  Yes [name], PA-C  furosemide (LASIX) 20 MG tablet Take 1 tablet (20 mg total) by mouth daily. 10/13/15  Yes [name], MD  Linaclotide [facility]) 145 MCG CAPS capsule 1 PO 30 mins prior to your first meal Patient taking differently: Take 145 mcg by mouth daily. 1 PO 30 mins prior to your first meal 10/03/15  Yes [name], MD  lisinopril (PRINIVIL,ZESTRIL) 10 MG tablet Take 1 tablet (10 mg total) by mouth daily. 01/04/15  Yes [name], MD  omeprazole (PRILOSEC) 20 MG capsule Take 1 capsule (20 mg total) by mouth daily. 03/15/15  Yes [name], PA-C  potassium chloride SA (K-DUR,KLOR-CON) 20 MEQ tablet Take 1 tablet (20 mEq total) by mouth daily. 10/13/15  Yes [name], MD  psyllium (METAMUCIL) 58.6 % packet Take 1 packet by mouth daily.   Yes  Historical Provider, MD  lidocaine-prilocaine (EMLA) cream Apply a quarter size amount to port site 1 hour prior to chemo. Do not rub in. Cover with plastic wrap. 04/28/15   [name], MD  nitroGLYCERIN (NITROGLYN) 2 % ointment Apply 0.5 inches topically as needed for chest pain. For rectal area    Historical Provider, MD      ANALYSIS: A 16 channel recording using standard 10 20 measurements is conducted for 22 minutes. The posterior dominant rhythm and gets as high as 5-1/2 Hz bilaterally. The recording is replete with 3-4 rhythmic delta slowing especially involving the frontal area. Some evidence of triphasic waves are also seen. The patient has a numerous vertex sharp wave activity most prominent in the occipital parietal lobes bilaterally especially on the right side. Photic stimulation and hypoventilation are not carried out. There are no focal or lateralized slowing.   IMPRESSION: This recording shows severe generalized encephalopathy indicating a severe global encephalopathy. The recording shows numerous amount of vertex sharp wave. These are typically benign but excessive number of these events raises suspicion of pathology.       Roth Ress A. Merlene Beasley, M.D.  Diplomate, Tax adviser of Psychiatry and Neurology ( Neurology).

## 2015-10-21 LAB — BASIC METABOLIC PANEL
Anion gap: 7 (ref 5–15)
BUN: 24 mg/dL — AB (ref 6–20)
CALCIUM: 8.3 mg/dL — AB (ref 8.9–10.3)
CHLORIDE: 106 mmol/L (ref 101–111)
CO2: 29 mmol/L (ref 22–32)
CREATININE: 0.76 mg/dL (ref 0.61–1.24)
GFR calc non Af Amer: >60 mL/min (ref >60)
Glucose, Bld: 83 mg/dL (ref 65–99)
Potassium: 3.7 mmol/L (ref 3.5–5.1)
SODIUM: 142 mmol/L (ref 135–145)

## 2015-10-21 LAB — URINE CULTURE: Culture: >=100000

## 2015-10-21 LAB — MAGNESIUM: Magnesium: 2.1 mg/dL (ref 1.7–2.4)

## 2015-10-21 MED ORDER — CALCIUM POLYCARBOPHIL 625 MG PO TABS
1250.0000 mg | ORAL_TABLET | Freq: Two times a day (BID) | ORAL | Status: DC
  Administered 2015-10-21 – 2015-10-23 (×4): 1250 mg via ORAL
  Filled 2015-10-21 (×8): qty 2

## 2015-10-21 MED ORDER — APIXABAN 5 MG PO TABS
10.0000 mg | ORAL_TABLET | Freq: Two times a day (BID) | ORAL | Status: DC
  Administered 2015-10-21 – 2015-10-24 (×6): 10 mg via ORAL
  Filled 2015-10-21 (×6): qty 2

## 2015-10-21 MED ORDER — APIXABAN 5 MG PO TABS: 5.0000 mg | ORAL_TABLET | Freq: Two times a day (BID) | ORAL | Status: DC

## 2015-10-21 MED ORDER — PANTOPRAZOLE SODIUM 40 MG PO TBEC
40.0000 mg | DELAYED_RELEASE_TABLET | Freq: Every day | ORAL | Status: DC
Start: 2015-10-22 — End: 2015-10-24
  Administered 2015-10-22 – 2015-10-24 (×3): 40 mg via ORAL
  Filled 2015-10-21 (×3): qty 1

## 2015-10-21 NOTE — Progress Notes (Addendum)
[name], male   DOB: July 26, 1946, 69 y.o.   MRN: [id number]  Assessment/Plan: ADMITTED WITH DIARRHEA DUE TO UTI +/- LINZESS?? . STILL HAVING MUSHY STOOLS, BUT CLINICALLY IMPROVED. MENTAL STATUS IMPROVED.  PLAN: 1. ADVANCE TO DYSPHAGIA 3 DIET 2. ADD FIBER DAILY 3. CONTINUE TO MONITOR SYMPTOMS. 4. SALINE LOCK  5. CHANGE PROTONIX TO PO   Subjective: Since I last evaluated the patient HIS CONFUSION HAS RESOLVED. STILL HAVING MUSHY STOOLS BUT NOT AS MANY. TOLERATING POs.  Objective: Vital signs in last 24 hours: Filed Vitals:   10/21/15 0900  BP: 151/81  Pulse: 76  Temp:   Resp: 19   General appearance: alert, cooperative and no distress Resp: clear to auscultation bilaterally Cardio: irregularly irregular rhythm GI: soft, non-tender; bowel sounds normal; no masses,  no organomegaly  Lab Results: Cr 0.76, Mg 2.1 K 3.7 Studies/Results: No results found.  Medications: I have reviewed the patient's current medications.   LOS: 5 days   Barney Drain 06/09/2014, 2:23 PM

## 2015-10-21 NOTE — Progress Notes (Signed)
ANTICOAGULATION CONSULT NOTE - Initial Consult  Pharmacy Consult for Apixaban Indication: DVT  No Known Allergies  Patient Measurements: Height: 5\' 7"  (170.2 cm) Weight: 241 lb 2.9 oz (109.4 kg) IBW/kg (Calculated) : 66.1 Heparin Dosing Weight:   Vital Signs: Temp: 97.2 F (36.2 C) (10/22 1143) Temp Source: Oral (10/22 1143) BP: 118/74 mmHg (10/22 1000) Pulse Rate: 71 (10/22 1000)  Labs:  Recent Labs  10/19/15 0802 10/19/15 1005 10/19/15 1600 10/19/15 2139 10/20/15 2013 10/21/15 0437  HGB 13.6  --   --   --  13.2  --   HCT 39.4  --   --   --  39.7  --   PLT 137*  --   --   --  123*  --   CREATININE  --  0.95  --   --   --  0.76  TROPONINI  --  <0.03 <0.03 <0.03  --   --     Estimated Creatinine Clearance: 104.3 mL/min (by C-G formula based on Cr of 0.76).   Medical History: Past Medical History  Diagnosis Date  . Varicose veins   . New onset atrial fibrillation ([place]) 01/02/2015  . Morbid obesity ([place]) 01/02/2015  . Hypertension   . Sleep apnea     been tested but has not received the CPAP yet  . GERD (gastroesophageal reflux disease)   . History of gout   . Cancer ([place]) 02/2015    colon  . Family history of colon cancer   . Family history of breast cancer in mother     Medications:  Scheduled:  . apixaban  10 mg Oral BID  . [START ON 10/25/2015] apixaban  5 mg Oral BID  . ciprofloxacin  1 drop Left Eye Q4H while awake  . escitalopram  20 mg Oral Daily  . levETIRAcetam  500 mg Intravenous Q12H  . levofloxacin (LEVAQUIN) IV  750 mg Intravenous Q24H  . lisinopril  10 mg Oral Daily  . pantoprazole (PROTONIX) IV  40 mg Intravenous QAC breakfast  . potassium chloride SA  40 mEq Oral Daily  . sodium chloride  3 mL Intravenous Q12H    Assessment: 68yo morbidly obese male, was on Eliquis for atrial fibrillation and DVT. Pt had change in mental status and is now unresponsive and transferred to ICU. Asked to switch to Lovenox. No bleeding noted Patient  feels much better, mental status improved, eating well. Asked to switch back to Eliquis  Goal of Therapy:  DVT treatment Monitor platelets by anticoagulation protocol: Yes   Plan:  Apixaban 10 mg bid for 7  doses, to complete initial 7 day therapy previously started Then start Apixaban 5 mg po bid Monitor CBC, signs of bleeding Labs per protocol   Abner Greenspan, Lamoine Magallon Bennett 10/21/2015,11:55 AM

## 2015-10-21 NOTE — Progress Notes (Signed)
Subjective: Patient feels much better. His mental status has improved. He is eating well. His urine is growing enteroccous species  Objective: Vital signs in last 24 hours: Temp:  [97.4 F (36.3 C)-98.4 F (36.9 C)] 97.4 F (36.3 C) (10/22 0744) Pulse Rate:  [69-88] 76 (10/22 0900) Resp:  [5-19] 19 (10/22 0900) BP: (107-151)/(60-90) 151/81 mmHg (10/22 0900) SpO2:  [91 %-100 %] 100 % (10/22 0900) Weight:  [109.4 kg (241 lb 2.9 oz)] 109.4 kg (241 lb 2.9 oz) (10/22 0438) Weight change: -0.6 kg (-1 lb 5.2 oz) Last BM Date: 10/20/15  Intake/Output from previous day: 10/21 0701 - 10/22 0700 In: 923.8 [I.V.:673.8; IV Piggyback:250] Out: 650 [Urine:650]  PHYSICAL EXAM General appearance: alert and no distress Resp: clear to auscultation bilaterally Cardio: regularly irregular rhythm GI: normal findings: bowel sounds normal, no bruits heard, no masses palpable and no organomegaly Extremities: extremities normal, atraumatic, no cyanosis or edema  Lab Results:  Results for orders placed or performed during the hospital encounter of 10/15/15 (from the past 48 hour(s))  Troponin I (q 6hr x 3)     Status: None   Collection Time: 10/19/15  4:00 PM  Result Value Ref Range   Troponin I <0.03 <0.031 ng/mL    Comment:        NO INDICATION OF MYOCARDIAL INJURY.   Troponin I (q 6hr x 3)     Status: None   Collection Time: 10/19/15  9:39 PM  Result Value Ref Range   Troponin I <0.03 <0.031 ng/mL    Comment:        NO INDICATION OF MYOCARDIAL INJURY.   CBC with Differential/Platelet     Status: Abnormal   Collection Time: 10/20/15  8:13 PM  Result Value Ref Range   WBC 7.3 4.0 - 10.5 K/uL   RBC 3.83 (L) 4.22 - 5.81 MIL/uL   Hemoglobin 13.2 13.0 - 17.0 g/dL   HCT 39.7 39.0 - 52.0 %   MCV 103.7 (H) 78.0 - 100.0 fL   MCH 34.5 (H) 26.0 - 34.0 pg   MCHC 33.2 30.0 - 36.0 g/dL   RDW 14.2 11.5 - 15.5 %   Platelets 123 (L) 150 - 400 K/uL   Neutrophils Relative % 60 %   Neutro Abs 4.3 1.7  - 7.7 K/uL   Lymphocytes Relative 28 %   Lymphs Abs 2.1 0.7 - 4.0 K/uL   Monocytes Relative 12 %   Monocytes Absolute 0.9 0.1 - 1.0 K/uL   Eosinophils Relative 0 %   Eosinophils Absolute 0.0 0.0 - 0.7 K/uL   Basophils Relative 0 %   Basophils Absolute 0.0 0.0 - 0.1 K/uL  Basic metabolic panel     Status: Abnormal   Collection Time: 10/21/15  4:37 AM  Result Value Ref Range   Sodium 142 135 - 145 mmol/L   Potassium 3.7 3.5 - 5.1 mmol/L   Chloride 106 101 - 111 mmol/L   CO2 29 22 - 32 mmol/L   Glucose, Bld 83 65 - 99 mg/dL   BUN 24 (H) 6 - 20 mg/dL   Creatinine, Ser 0.76 0.61 - 1.24 mg/dL   Calcium 8.3 (L) 8.9 - 10.3 mg/dL   GFR calc non Af Amer >60 >60 mL/min   GFR calc Af Amer >60 >60 mL/min    Comment: (NOTE) The eGFR has been calculated using the CKD EPI equation. This calculation has not been validated in all clinical situations. eGFR's persistently <60 mL/min signify possible Chronic Kidney Disease.  Anion gap 7 5 - 15  Magnesium     Status: None   Collection Time: 10/21/15  4:37 AM  Result Value Ref Range   Magnesium 2.1 1.7 - 2.4 mg/dL    ABGS  Recent Labs  10/19/15 0750  PHART 7.512*  PO2ART 94.6  HCO3 30.2*   CULTURES Recent Results (from the past 240 hour(s))  C difficile quick scan w PCR reflex     Status: None   Collection Time: 10/17/15 10:55 PM  Result Value Ref Range Status   C Diff antigen NEGATIVE NEGATIVE Final   C Diff toxin NEGATIVE NEGATIVE Final   C Diff interpretation Negative for toxigenic C. difficile  Final  Culture, Urine     Status: None   Collection Time: 10/19/15  9:47 AM  Result Value Ref Range Status   Specimen Description URINE, CLEAN CATCH  Final   Special Requests NONE  Final   Culture   Final    >=100,000 COLONIES/mL ENTEROCOCCUS SPECIES Performed at [facility]    Report Status 10/21/2015 FINAL  Final   Organism ID, Bacteria ENTEROCOCCUS SPECIES  Final      Susceptibility   Enterococcus species - MIC*     AMPICILLIN <=2 SENSITIVE Sensitive     LEVOFLOXACIN 1 SENSITIVE Sensitive     NITROFURANTOIN <=16 SENSITIVE Sensitive     VANCOMYCIN 1 SENSITIVE Sensitive     * >=100,000 COLONIES/mL ENTEROCOCCUS SPECIES  MRSA PCR Screening     Status: None   Collection Time: 10/19/15  9:47 AM  Result Value Ref Range Status   MRSA by PCR NEGATIVE NEGATIVE Final    Comment:        The GeneXpert MRSA Assay (FDA approved for NASAL specimens only), is one component of a comprehensive MRSA colonization surveillance program. It is not intended to diagnose MRSA infection nor to guide or monitor treatment for MRSA infections.    Studies/Results: Mr Herby Abraham Contrast  10/20/2015  CLINICAL DATA:  Mental status changes. Nonresponsive. History of colon cancer. EXAM: MRI HEAD WITHOUT CONTRAST TECHNIQUE: Multiplanar, multiecho pulse sequences of the brain and surrounding structures were obtained without intravenous contrast. COMPARISON:  CT head 10/19/15.  MR brain 10/05/2015. FINDINGS: Technique was used as the patient was unable to remain still for the exam. Overall study diagnostic. No evidence for acute infarction, hemorrhage, mass lesion, hydrocephalus, or extra-axial fluid. Generalized cerebral and cerebellar atrophy. Mild subcortical and periventricular T2 and FLAIR hyperintensities, likely chronic microvascular ischemic change. No obvious midline abnormality. Flow voids are maintained in the carotid, basilar, and both vertebral arteries. No extracranial soft tissue abnormality is seen. Good general agreement with prior CT and MRI. IMPRESSION: Atrophy. Mild small vessel disease. No acute intracranial findings. Electronically Signed   By: Staci Righter M.D.   On: 10/20/2015 09:07    Medications: I have reviewed the patient's current medications.  Assesment:   Principal Problem:   Acute diastolic CHF (congestive heart failure) (HCC) Active Problems:   Morbid obesity (HCC)   Colon carcinoma (HCC)    Peripheral edema   Generalized weakness   DVT (deep venous thrombosis) (HCC)   Atrial fibrillation (HCC)   Abnormal LFTs   Fecal incontinence   Altered mental status    Plan:  Medications reviewed Will continue Levaquin pending sensitivity result Will change to oral anticoagulation.    LOS: 5 days   Elliotte Marsalis 10/21/2015, 11:17 AM

## 2015-10-21 NOTE — Progress Notes (Signed)
PT ALERT AND ORIENTED. O2 SAT 95% ON O2L/[place]. DIMINISHED BREQTH SOUNDS. LT FOREARM IV PATENT. FOLEY CATHETER PATENT DRAINING ORANGE CLEAR URINE.the patient CONTINUES TO HAVE 2-3+ BILATERAL LOWER EXTREMITY EDEMA. DENIES ANY DISCOMFORT OR DISTRESS. TRANSFERRING TO  ROOM 327 ON TELEMETRY. REPORT GIVEN TO MEGAN BULLINS RN.

## 2015-10-22 LAB — CBC
HEMATOCRIT: 35.4 % — AB (ref 39.0–52.0)
HEMOGLOBIN: 11.8 g/dL — AB (ref 13.0–17.0)
MCH: 34.5 pg — AB (ref 26.0–34.0)
MCHC: 33.3 g/dL (ref 30.0–36.0)
MCV: 103.5 fL — AB (ref 78.0–100.0)
Platelets: 96 10*3/uL — ABNORMAL LOW (ref 150–400)
RBC: 3.42 MIL/uL — AB (ref 4.22–5.81)
RDW: 13.9 % (ref 11.5–15.5)
WBC: 4.9 10*3/uL (ref 4.0–10.5)

## 2015-10-22 LAB — BASIC METABOLIC PANEL
Anion gap: 3 — ABNORMAL LOW (ref 5–15)
BUN: 14 mg/dL (ref 6–20)
CHLORIDE: 107 mmol/L (ref 101–111)
CO2: 30 mmol/L (ref 22–32)
Calcium: 7.9 mg/dL — ABNORMAL LOW (ref 8.9–10.3)
Creatinine, Ser: 0.89 mg/dL (ref 0.61–1.24)
GFR calc Af Amer: >60 mL/min (ref >60)
GFR calc non Af Amer: >60 mL/min (ref >60)
Glucose, Bld: 93 mg/dL (ref 65–99)
POTASSIUM: 4 mmol/L (ref 3.5–5.1)
SODIUM: 140 mmol/L (ref 135–145)

## 2015-10-22 MED ORDER — SENNOSIDES-DOCUSATE SODIUM 8.6-50 MG PO TABS
1.0000 | ORAL_TABLET | Freq: Two times a day (BID) | ORAL | Status: DC
  Administered 2015-10-23 – 2015-10-24 (×3): 1 via ORAL
  Filled 2015-10-22 (×4): qty 1

## 2015-10-22 MED ORDER — LEVOFLOXACIN 500 MG PO TABS
500.0000 mg | ORAL_TABLET | Freq: Every day | ORAL | Status: DC
  Administered 2015-10-22 – 2015-10-24 (×3): 500 mg via ORAL
  Filled 2015-10-22 (×4): qty 1

## 2015-10-22 MED ORDER — SENNOSIDES-DOCUSATE SODIUM 8.6-50 MG PO TABS
1.0000 | ORAL_TABLET | Freq: Two times a day (BID) | ORAL | Status: DC
  Administered 2015-10-22: 1 via ORAL

## 2015-10-22 NOTE — Progress Notes (Signed)
Subjective: Patient feels better. He is alert and awake. He is taking his oral feeding well.  Objective: Vital signs in last 24 hours: Temp:  [97.2 F (36.2 C)-98.7 F (37.1 C)] 98.7 F (37.1 C) (10/23 0517) Pulse Rate:  [71-83] 74 (10/23 0517) Resp:  [17-20] 18 (10/23 0517) BP: (93-122)/(58-78) 122/78 mmHg (10/23 0517) SpO2:  [95 %-100 %] 100 % (10/23 0517) Weight change:  Last BM Date: 10/21/15  Intake/Output from previous day: 10/22 0701 - 10/23 0700 In: 1850 [P.O.:1200; I.V.:300; IV Piggyback:350] Out: 850 [Urine:850]  PHYSICAL EXAM General appearance: alert and no distress Resp: clear to auscultation bilaterally Cardio: regularly irregular rhythm GI: normal findings: bowel sounds normal, no bruits heard, no masses palpable and no organomegaly Extremities: extremities normal, atraumatic, no cyanosis or edema  Lab Results:  Results for orders placed or performed during the hospital encounter of 10/15/15 (from the past 48 hour(s))  CBC with Differential/Platelet     Status: Abnormal   Collection Time: 10/20/15  8:13 PM  Result Value Ref Range   WBC 7.3 4.0 - 10.5 K/uL   RBC 3.83 (L) 4.22 - 5.81 MIL/uL   Hemoglobin 13.2 13.0 - 17.0 g/dL   HCT 39.7 39.0 - 52.0 %   MCV 103.7 (H) 78.0 - 100.0 fL   MCH 34.5 (H) 26.0 - 34.0 pg   MCHC 33.2 30.0 - 36.0 g/dL   RDW 14.2 11.5 - 15.5 %   Platelets 123 (L) 150 - 400 K/uL   Neutrophils Relative % 60 %   Neutro Abs 4.3 1.7 - 7.7 K/uL   Lymphocytes Relative 28 %   Lymphs Abs 2.1 0.7 - 4.0 K/uL   Monocytes Relative 12 %   Monocytes Absolute 0.9 0.1 - 1.0 K/uL   Eosinophils Relative 0 %   Eosinophils Absolute 0.0 0.0 - 0.7 K/uL   Basophils Relative 0 %   Basophils Absolute 0.0 0.0 - 0.1 K/uL  Basic metabolic panel     Status: Abnormal   Collection Time: 10/21/15  4:37 AM  Result Value Ref Range   Sodium 142 135 - 145 mmol/L   Potassium 3.7 3.5 - 5.1 mmol/L   Chloride 106 101 - 111 mmol/L   CO2 29 22 - 32 mmol/L   Glucose,  Bld 83 65 - 99 mg/dL   BUN 24 (H) 6 - 20 mg/dL   Creatinine, Ser 0.76 0.61 - 1.24 mg/dL   Calcium 8.3 (L) 8.9 - 10.3 mg/dL   GFR calc non Af Amer >60 >60 mL/min   GFR calc Af Amer >60 >60 mL/min    Comment: (NOTE) The eGFR has been calculated using the CKD EPI equation. This calculation has not been validated in all clinical situations. eGFR's persistently <60 mL/min signify possible Chronic Kidney Disease.    Anion gap 7 5 - 15  Magnesium     Status: None   Collection Time: 10/21/15  4:37 AM  Result Value Ref Range   Magnesium 2.1 1.7 - 2.4 mg/dL  CBC     Status: Abnormal   Collection Time: 10/22/15  6:26 AM  Result Value Ref Range   WBC 4.9 4.0 - 10.5 K/uL   RBC 3.42 (L) 4.22 - 5.81 MIL/uL   Hemoglobin 11.8 (L) 13.0 - 17.0 g/dL   HCT 35.4 (L) 39.0 - 52.0 %   MCV 103.5 (H) 78.0 - 100.0 fL   MCH 34.5 (H) 26.0 - 34.0 pg   MCHC 33.3 30.0 - 36.0 g/dL   RDW 13.9 11.5 -  15.5 %   Platelets 96 (L) 150 - 400 K/uL    Comment: SPECIMEN CHECKED FOR CLOTS PLATELET COUNT CONFIRMED BY SMEAR   Basic metabolic panel     Status: Abnormal   Collection Time: 10/22/15  6:26 AM  Result Value Ref Range   Sodium 140 135 - 145 mmol/L   Potassium 4.0 3.5 - 5.1 mmol/L   Chloride 107 101 - 111 mmol/L   CO2 30 22 - 32 mmol/L   Glucose, Bld 93 65 - 99 mg/dL   BUN 14 6 - 20 mg/dL   Creatinine, Ser 0.89 0.61 - 1.24 mg/dL   Calcium 7.9 (L) 8.9 - 10.3 mg/dL   GFR calc non Af Amer >60 >60 mL/min   GFR calc Af Amer >60 >60 mL/min    Comment: (NOTE) The eGFR has been calculated using the CKD EPI equation. This calculation has not been validated in all clinical situations. eGFR's persistently <60 mL/min signify possible Chronic Kidney Disease.    Anion gap 3 (L) 5 - 15    ABGS No results for input(s): PHART, PO2ART, TCO2, HCO3 in the last 72 hours.  Invalid input(s): PCO2 CULTURES Recent Results (from the past 240 hour(s))  C difficile quick scan w PCR reflex     Status: None   Collection  Time: 10/17/15 10:55 PM  Result Value Ref Range Status   C Diff antigen NEGATIVE NEGATIVE Final   C Diff toxin NEGATIVE NEGATIVE Final   C Diff interpretation Negative for toxigenic C. difficile  Final  Culture, Urine     Status: None   Collection Time: 10/19/15  9:47 AM  Result Value Ref Range Status   Specimen Description URINE, CLEAN CATCH  Final   Special Requests NONE  Final   Culture   Final    >=100,000 COLONIES/mL ENTEROCOCCUS SPECIES Performed at [facility]    Report Status 10/21/2015 FINAL  Final   Organism ID, Bacteria ENTEROCOCCUS SPECIES  Final      Susceptibility   Enterococcus species - MIC*    AMPICILLIN <=2 SENSITIVE Sensitive     LEVOFLOXACIN 1 SENSITIVE Sensitive     NITROFURANTOIN <=16 SENSITIVE Sensitive     VANCOMYCIN 1 SENSITIVE Sensitive     * >=100,000 COLONIES/mL ENTEROCOCCUS SPECIES  MRSA PCR Screening     Status: None   Collection Time: 10/19/15  9:47 AM  Result Value Ref Range Status   MRSA by PCR NEGATIVE NEGATIVE Final    Comment:        The GeneXpert MRSA Assay (FDA approved for NASAL specimens only), is one component of a comprehensive MRSA colonization surveillance program. It is not intended to diagnose MRSA infection nor to guide or monitor treatment for MRSA infections.    Studies/Results: No results found.  Medications: I have reviewed the patient's current medications.  Assesment:   Principal Problem:   Acute diastolic CHF (congestive heart failure) (HCC) Active Problems:   Morbid obesity (HCC)   Colon carcinoma (HCC)   Peripheral edema   Generalized weakness   DVT (deep venous thrombosis) (HCC)   Atrial fibrillation (HCC)   Abnormal LFTs   Fecal incontinence   Altered mental status    Plan:  Medications reviewed Will change Levaquin to po Will change to oral anticoagulation.    LOS: 6 days   Airis Barbee 10/22/2015, 9:36 AM

## 2015-10-22 NOTE — Progress Notes (Addendum)
[name], male   DOB: [birth year], 69 y.o.   MRN: [id number]   Assessment/Plan: ADMITTED WITH DIARRHA AND MENTAL STATUS CHANGES. NO BM FOR > 24 HRS. LOW FAT DIET STARTED OCT 22.  PLAN: 1. ADD SENNA/COLACE BID 2. CONTINUE FIBER 3. CONTINUE TO MONITOR SYMPTOMS.    Subjective: Since I last evaluated the patient HIS IS NO LONGER CONFUSED. NO BM IN > 24 HRS.  Objective: Vital signs in last 24 hours: Filed Vitals:   10/22/15 0517  BP: 122/78  Pulse: 74  Temp: 98.7 F (37.1 C)  Resp: 18   General appearance: alert, cooperative and no distress Resp: clear to auscultation bilaterally Cardio: regular rate and rhythm GI: soft, non-tender; bowel sounds normal;   Lab Results:  Hb 11.8 PLT CT 98  Studies/Results: No results found.  Medications: I have reviewed the patient's current medications.   LOS: 5 days   Barney Drain 06/09/2014, 2:23 PM

## 2015-10-22 NOTE — Progress Notes (Signed)
Patient OOB to chair and BR today with assistance.  Able to ambulate some with walker, but unsteady.  Tolerated well.

## 2015-10-23 LAB — CBC
HCT: 37.2 % — ABNORMAL LOW (ref 39.0–52.0)
HEMOGLOBIN: 12.2 g/dL — AB (ref 13.0–17.0)
MCH: 34 pg (ref 26.0–34.0)
MCHC: 32.8 g/dL (ref 30.0–36.0)
MCV: 103.6 fL — ABNORMAL HIGH (ref 78.0–100.0)
PLATELETS: 94 10*3/uL — AB (ref 150–400)
RBC: 3.59 MIL/uL — AB (ref 4.22–5.81)
RDW: 14 % (ref 11.5–15.5)
WBC: 5.6 10*3/uL (ref 4.0–10.5)

## 2015-10-23 MED ORDER — LEVETIRACETAM 500 MG PO TABS
500.0000 mg | ORAL_TABLET | Freq: Two times a day (BID) | ORAL | Status: DC
  Administered 2015-10-23 – 2015-10-24 (×2): 500 mg via ORAL
  Filled 2015-10-23 (×2): qty 1

## 2015-10-23 NOTE — Progress Notes (Signed)
    Subjective: "smear" of BM yesterday per wife. No abdominal pain. No confusion.   Objective: Vital signs in last 24 hours: Temp:  [97.5 F (36.4 C)-98.6 F (37 C)] 98.2 F (36.8 C) (10/24 0800) Pulse Rate:  [67-87] 70 (10/24 0800) Resp:  [16-20] 20 (10/24 0800) BP: (97-132)/(60-78) 132/78 mmHg (10/24 0800) SpO2:  [98 %-100 %] 99 % (10/24 0800) Weight:  [243 lb 9.6 oz (110.496 kg)] 243 lb 9.6 oz (110.496 kg) (10/24 0500) Last BM Date: 10/22/15 General:   Alert and oriented, pleasant Head:  Normocephalic and atraumatic. Eyes:  No icterus, sclera clear. Conjuctiva pink.  Abdomen:  Bowel sounds present, soft, non-tender, obese.  Neurologic:  Alert and  oriented x4 Psych:  Alert and cooperative. Normal mood and affect.  Intake/Output from previous day: 10/23 0701 - 10/24 0700 In: 920 [P.O.:720; IV Piggyback:200] Out: 1175 [Urine:1175] Intake/Output this shift:    Lab Results:  Recent Labs  10/20/15 2013 10/22/15 0626 10/23/15 0650  WBC 7.3 4.9 5.6  HGB 13.2 11.8* 12.2*  HCT 39.7 35.4* 37.2*  PLT 123* 96* 94*   BMET  Recent Labs  10/21/15 0437 10/22/15 0626  NA 142 140  K 3.7 4.0  CL 106 107  CO2 29 30  GLUCOSE 83 93  BUN 24* 14  CREATININE 0.76 0.89  CALCIUM 8.3* 7.9*    Assessment/Plan: 69 year old male with history of stage IIA colon adenocarcinoma, admitted with heart failure, right DVT, mental status changes, loose stool. Improved clinically since admission. Since Linzess hs been held, diarrhea improved, GI pathogen with positive Cdiff toxin A/B but Cdiff quick scan with PCR reflex negative, which could mean he may possibly be an asymptomatic carrier of Clostridium difficile but not actively with Clostridium difficile infection. Clinically, he does not appear to have this. No treatment indicated. Continue current treatment of Fiber tablet BID, Senokot-S BID. Will follow peripherally. Further evaluation of elevated LFTs in setting of fatty liver as  outpatient. Unable to exclude chronic underlying liver disease.   Orvil Feil, ANP-BC [facility]       LOS: 7 days    10/23/2015, 9:28 AM

## 2015-10-23 NOTE — Progress Notes (Signed)
Subjective: He is doing better. He has stool isolation ordered but I'm not sure why. He is much improved as far as his mental status.  Objective: Vital signs in last 24 hours: Temp:  [97.5 F (36.4 C)-98.6 F (37 C)] 98.2 F (36.8 C) (10/24 0800) Pulse Rate:  [67-87] 70 (10/24 0800) Resp:  [16-20] 20 (10/24 0800) BP: (97-132)/(60-78) 132/78 mmHg (10/24 0800) SpO2:  [98 %-100 %] 99 % (10/24 0800) Weight:  [110.496 kg (243 lb 9.6 oz)] 110.496 kg (243 lb 9.6 oz) (10/24 0500) Weight change:  Last BM Date: 10/22/15  Intake/Output from previous day: 10/23 0701 - 10/24 0700 In: 920 [P.O.:720; IV Piggyback:200] Out: 1175 [Urine:1175]  PHYSICAL EXAM General appearance: alert, cooperative and no distress Resp: clear to auscultation bilaterally Cardio: irregularly irregular rhythm GI: soft, non-tender; bowel sounds normal; no masses,  no organomegaly Extremities: Trace edema  Lab Results:  Results for orders placed or performed during the hospital encounter of 10/15/15 (from the past 48 hour(s))  CBC     Status: Abnormal   Collection Time: 10/22/15  6:26 AM  Result Value Ref Range   WBC 4.9 4.0 - 10.5 K/uL   RBC 3.42 (L) 4.22 - 5.81 MIL/uL   Hemoglobin 11.8 (L) 13.0 - 17.0 g/dL   HCT 35.4 (L) 39.0 - 52.0 %   MCV 103.5 (H) 78.0 - 100.0 fL   MCH 34.5 (H) 26.0 - 34.0 pg   MCHC 33.3 30.0 - 36.0 g/dL   RDW 13.9 11.5 - 15.5 %   Platelets 96 (L) 150 - 400 K/uL    Comment: SPECIMEN CHECKED FOR CLOTS PLATELET COUNT CONFIRMED BY SMEAR   Basic metabolic panel     Status: Abnormal   Collection Time: 10/22/15  6:26 AM  Result Value Ref Range   Sodium 140 135 - 145 mmol/L   Potassium 4.0 3.5 - 5.1 mmol/L   Chloride 107 101 - 111 mmol/L   CO2 30 22 - 32 mmol/L   Glucose, Bld 93 65 - 99 mg/dL   BUN 14 6 - 20 mg/dL   Creatinine, Ser 0.89 0.61 - 1.24 mg/dL   Calcium 7.9 (L) 8.9 - 10.3 mg/dL   GFR calc non Af Amer >60 >60 mL/min   GFR calc Af Amer >60 >60 mL/min    Comment: (NOTE) The  eGFR has been calculated using the CKD EPI equation. This calculation has not been validated in all clinical situations. eGFR's persistently <60 mL/min signify possible Chronic Kidney Disease.    Anion gap 3 (L) 5 - 15    ABGS No results for input(s): PHART, PO2ART, TCO2, HCO3 in the last 72 hours.  Invalid input(s): PCO2 CULTURES Recent Results (from the past 240 hour(s))  C difficile quick scan w PCR reflex     Status: None   Collection Time: 10/17/15 10:55 PM  Result Value Ref Range Status   C Diff antigen NEGATIVE NEGATIVE Final   C Diff toxin NEGATIVE NEGATIVE Final   C Diff interpretation Negative for toxigenic C. difficile  Final  Culture, Urine     Status: None   Collection Time: 10/19/15  9:47 AM  Result Value Ref Range Status   Specimen Description URINE, CLEAN CATCH  Final   Special Requests NONE  Final   Culture   Final    >=100,000 COLONIES/mL ENTEROCOCCUS SPECIES Performed at [facility]    Report Status 10/21/2015 FINAL  Final   Organism ID, Bacteria ENTEROCOCCUS SPECIES  Final  Susceptibility   Enterococcus species - MIC*    AMPICILLIN <=2 SENSITIVE Sensitive     LEVOFLOXACIN 1 SENSITIVE Sensitive     NITROFURANTOIN <=16 SENSITIVE Sensitive     VANCOMYCIN 1 SENSITIVE Sensitive     * >=100,000 COLONIES/mL ENTEROCOCCUS SPECIES  MRSA PCR Screening     Status: None   Collection Time: 10/19/15  9:47 AM  Result Value Ref Range Status   MRSA by PCR NEGATIVE NEGATIVE Final    Comment:        The GeneXpert MRSA Assay (FDA approved for NASAL specimens only), is one component of a comprehensive MRSA colonization surveillance program. It is not intended to diagnose MRSA infection nor to guide or monitor treatment for MRSA infections.    Studies/Results: No results found.  Medications:  Prior to Admission:  Prescriptions prior to admission  Medication Sig Dispense Refill Last Dose  . ciprofloxacin (CILOXAN) 0.3 % ophthalmic solution Place 1  drop into the left eye every 4 (four) hours while awake.   0 10/14/2015 at Unknown time  . ELIQUIS 5 MG TABS tablet TAKE 1 TABLET BY MOUTH TWICE A DAY  12 10/14/2015 at 730  . escitalopram (LEXAPRO) 20 MG tablet Take 1 tablet (20 mg total) by mouth daily. 30 tablet 2 10/14/2015 at Unknown time  . furosemide (LASIX) 20 MG tablet Take 1 tablet (20 mg total) by mouth daily. 90 tablet 3 10/14/2015 at Unknown time  . Linaclotide (LINZESS) 145 MCG CAPS capsule 1 PO 30 mins prior to your first meal (Patient taking differently: Take 145 mcg by mouth daily. 1 PO 30 mins prior to your first meal) 30 capsule 11 10/14/2015 at Unknown time  . lisinopril (PRINIVIL,ZESTRIL) 10 MG tablet Take 1 tablet (10 mg total) by mouth daily. 30 tablet 12 10/14/2015 at Unknown time  . omeprazole (PRILOSEC) 20 MG capsule Take 1 capsule (20 mg total) by mouth daily. 30 capsule 3 10/14/2015 at Unknown time  . potassium chloride SA (K-DUR,KLOR-CON) 20 MEQ tablet Take 1 tablet (20 mEq total) by mouth daily. 90 tablet 3 10/14/2015 at Unknown time  . psyllium (METAMUCIL) 58.6 % packet Take 1 packet by mouth daily.   10/14/2015 at Unknown time  . lidocaine-prilocaine (EMLA) cream Apply a quarter size amount to port site 1 hour prior to chemo. Do not rub in. Cover with plastic wrap. 30 g 3 10/12/2015  . nitroGLYCERIN (NITROGLYN) 2 % ointment Apply 0.5 inches topically as needed for chest pain. For rectal area   unknown   Scheduled: . apixaban  10 mg Oral BID  . [START ON 10/25/2015] apixaban  5 mg Oral BID  . ciprofloxacin  1 drop Left Eye Q4H while awake  . escitalopram  20 mg Oral Daily  . levETIRAcetam  500 mg Intravenous Q12H  . levofloxacin  500 mg Oral Daily  . lisinopril  10 mg Oral Daily  . pantoprazole  40 mg Oral QAC breakfast  . polycarbophil  1,250 mg Oral BID  . potassium chloride SA  40 mEq Oral Daily  . senna-docusate  1 tablet Oral BID  . sodium chloride  3 mL Intravenous Q12H   Continuous:  CLE:XNTZGY  chloride, acetaminophen, ondansetron (ZOFRAN) IV, sodium chloride  Assesment: He was admitted with acute diastolic heart failure. He's had a complicated course and had an episode of altered mental status that may be related to seizure disorder. He is much improved. Principal Problem:   Acute diastolic CHF (congestive heart failure) (HCC) Active Problems:  Morbid obesity (HCC)   Colon carcinoma (HCC)   Peripheral edema   Generalized weakness   DVT (deep venous thrombosis) (HCC)   Atrial fibrillation (HCC)   Abnormal LFTs   Fecal incontinence   Altered mental status    Plan: Discontinue stool isolation. Continue current treatments. PT consultation. Probably home in the morning    LOS: 7 days   [name] L 10/23/2015, 9:14 AM

## 2015-10-24 ENCOUNTER — Encounter: Payer: Self-pay | Admitting: *Deleted

## 2015-10-24 DIAGNOSIS — M4712 Other spondylosis with myelopathy, cervical region: Secondary | ICD-10-CM | POA: Diagnosis present

## 2015-10-24 MED ORDER — LEVOFLOXACIN 500 MG PO TABS: 500.0000 mg | ORAL_TABLET | Freq: Every day | ORAL | Status: DC

## 2015-10-24 MED ORDER — LEVETIRACETAM 500 MG PO TABS: 500.0000 mg | ORAL_TABLET | Freq: Two times a day (BID) | ORAL | Status: DC

## 2015-10-24 MED ORDER — FUROSEMIDE 20 MG PO TABS: 40.0000 mg | ORAL_TABLET | Freq: Every day | ORAL | Status: DC

## 2015-10-24 MED ORDER — PREDNISONE 10 MG PO TABS: ORAL_TABLET | ORAL | Status: DC

## 2015-10-24 NOTE — Care Management Important Message (Signed)
Important Message  Patient Details  Name: MUREL SHENBERGER MRN: [id number] Date of Birth: 10/09/1946   Medicare Important Message Given:  Yes-third notification given    Joylene Draft, RN 10/24/2015, 8:59 AM

## 2015-10-24 NOTE — Progress Notes (Signed)
He feels much better and wants to go home. He still has some issues with balance but is better when he uses a rolling walker. He has not had a bowel movement. He has improved as far as his heart failure is concerned he is much better after his episode of altered mental status and is back to baseline and he will be discharged today

## 2015-10-24 NOTE — Patient Outreach (Signed)
[facility]) Care Management  10/24/2015  ULYESS MUTO 31-Aug-1946 [id number]   Request from Burgess Amor, RN to assign Community RN, assigned Kathie Rhodes, RN.  Thanks, Ronnell Freshwater. [place], [place] Assistant Phone: [phone] Fax: [phone]

## 2015-10-24 NOTE — Consult Note (Signed)
   [facility] Inpatient Consult   10/24/2015  [name] 25-Sep-1946 [id number]  Patient contacted RNCM as he has decided he would like to elect [place] Management for chronic disease management services after he discharges. Our community based plan of care has focused on disease management and community resource support.  Patient will receive a post discharge transition of care calls and will be evaluated for monthly home visits for assessments and disease process education.  [facility] consent form obtained and packet given to patient.  Inpatient Case Manager aware that [place] [facility] will follow post discharge. Of note, [facility] Care Management services does not replace or interfere with any services that are arranged by inpatient case management or social work.   For additional questions or referrals please contact: Royetta Crochet. Laymond Purser, RN, BSN, [facility] Liaison [phone]

## 2015-10-24 NOTE — Progress Notes (Addendum)
Received call from [place], Infection Prevention nurse.  Patient's CDiff was negative; however, pathogen panel showed CDiff positive (in comment section).  RN notified Dr. Luan Pulling.  Patient has not diarrhea.  Patient's wife states "only a smear."  Dr. Luan Pulling stated that patient may be discharged home with follow-up within two weeks and does not require treatment for the CDiff, since no diarrhea.  Patient to come in earlier if he does experience diarrhea.   Patient verbalizes understanding.    Patient voided 200 ml after foley removed this morning.  Patient's IV removed.  Site WNL.  AVS reviewed with patient and patient's wife.  Both verbalized understanding of discharge instructions, physician follow-up, medications,  heart failure education, and Eliquis education.  Patient to discharge with [place] RN and PT services.  Verbalizes all belongings intact and in possession at time of discharge.  Patient awaiting NT for escort to main entrance for discharge.  Patient stable at time of discharge.

## 2015-10-24 NOTE — Evaluation (Signed)
Physical Therapy Evaluation Patient Details Name: [name] MRN: [id number] DOB: July 31, 1946 Today's Date: 10/24/2015   History of Present Illness  Pt is a 69yo white male, morbidly obese, currently undergoing chemotherapy for CA. Pt was admitted with multiple medical problems. He has a DVT in his right leg. He has marked swelling of both legs. He had gained about 20 pounds. In addition to that he reports chronic decline of balance over the past 6 weeks and has lost control of his stool. He is known to have atrial fibrillation and he was anticoagulated on Eliquis but he had been off that so he could have colonoscopy so I don't think his clot is a failure of his anticoagulant. He had echocardiogram about a month ago that showed normal left ventricular systolic function but diastolic function could not be assessed but it is felt that he probably has chronic diastolic heart failure. He says he feels a little better.  He had an episode of unresponsiveness and was transferred to ICU.  He is now back on the medical floor and to be discharged today.  MD requests a reevaluation prior to discharge to ensure his ability to manage at home.  Clinical Impression  Pt was seen for a reevaluation.  He was alert and oriented, no c/o.  His wife was present.  He is found to be deconditioned with decreased standing balance.  He was instructed in gait with a rolling walker and able to ambulate 36' with a stable gait pattern.  He is now agreeable to HHPT.    Follow Up Recommendations Home health PT    Equipment Recommendations  None recommended by PT    Recommendations for Other Services   none    Precautions / Restrictions Precautions Precautions: Fall Precaution Comments: high fall risk due to poor balance Restrictions Weight Bearing Restrictions: No      Mobility  Bed Mobility Overal bed mobility: Modified Independent Bed Mobility: Supine to Sit              Transfers Overall transfer  level: Modified independent Equipment used: Rolling walker (2 wheeled) Transfers: Sit to/from Stand Sit to Stand: Supervision            Ambulation/Gait Ambulation/Gait assistance: Supervision Ambulation Distance (Feet): 60 Feet Assistive device: Rolling walker (2 wheeled) Gait Pattern/deviations: Wide base of support Gait velocity: pt advised to slow gait in order to achieve better control Gait velocity interpretation: >2.62 ft/sec, indicative of independent community ambulator General Gait Details: Compensated trendelenburg on R side during gait,  instructed in safe turns with walker  Stairs            Wheelchair Mobility    Modified Rankin (Stroke Patients Only)       Balance Overall balance assessment: Needs assistance;History of Falls Sitting-balance support: No upper extremity supported;Feet supported Sitting balance-Leahy Scale: Normal     Standing balance support: Bilateral upper extremity supported Standing balance-Leahy Scale: Fair                               Pertinent Vitals/Pain Pain Assessment: No/denies pain    Home Living Family/patient expects to be discharged to:: Private residence Living Arrangements: Spouse/significant other Available Help at Discharge: Family;Available 24 hours/day Type of Home: House Home Access: Stairs to enter Entrance Stairs-Rails: None Entrance Stairs-Number of Steps: we have recommended that a ramp replace the steps Home Layout: One level Home Equipment: Walker - 2 wheels;Bedside  commode;Cane - single point;Tub bench;Wheelchair - manual      Prior Function Level of Independence: Needs assistance   Gait / Transfers Assistance Needed: SPC +1 ModA for ambulation to prevent falling.   ADL's / Homemaking Assistance Needed: some assist with bathing/dressing        Hand Dominance   Dominant Hand: Right    Extremity/Trunk Assessment               Lower Extremity Assessment: Generalized  weakness      Cervical / Trunk Assessment: Kyphotic  Communication   Communication: No difficulties  Cognition Arousal/Alertness: Awake/alert Behavior During Therapy: WFL for tasks assessed/performed Overall Cognitive Status: Within Functional Limits for tasks assessed                      General Comments      Exercises        Assessment/Plan    PT Assessment All further PT needs can be met in the next venue of care  PT Diagnosis Generalized weakness;Difficulty walking (deconditioned)   PT Problem List Decreased strength;Decreased activity tolerance;Decreased balance;Decreased mobility;Decreased safety awareness  PT Treatment Interventions     PT Goals (Current goals can be found in the Care Plan section) Acute Rehab PT Goals PT Goal Formulation: All assessment and education complete, DC therapy    Frequency     Barriers to discharge Inaccessible home environment need to have a ramp installed at their steps...wife is aware    Co-evaluation               End of Session Equipment Utilized During Treatment: Gait belt Activity Tolerance: Patient tolerated treatment well Patient left: in chair;with call bell/phone within reach;with family/visitor present Nurse Communication: Mobility status         Time: 2111-5520 PT Time Calculation (min) (ACUTE ONLY): 23 min   Charges:   PT Evaluation $PT Re-evaluation: 1 Procedure     PT G CodesSable Feil  PT 10/24/2015, 9:03 AM [phone]

## 2015-10-24 NOTE — Care Management Note (Signed)
Case Management Note  Patient Details  Name: [name] MRN: [id number] Date of Birth: 07-23-1946  Subjective/Objective:                    Action/Plan:   Expected Discharge Date:                  Expected Discharge Plan:  [place]  In-House Referral:  NA  Discharge planning Services  CM Consult  Post Acute Care Choice:  Durable Medical Equipment Choice offered to:  Patient  DME Arranged:  Walker rolling DME Agency:  [place] Arranged:  RN, PT [facility] Agency:  [place]  Status of Service:  Completed, signed off  Medicare Important Message Given:  Yes-third notification given Date Medicare IM Given:    Medicare IM give by:    Date Additional Medicare IM Given:    Additional Medicare Important Message give by:     If discussed at Veteran of Stay Meetings, dates discussed:    Additional Comments: Pt discharged home today with [facility] RN and PT (per pts choice). Romualdo Bolk of [facility] is aware and will collect the pts information from the chart. [place] services to start on 10/30/15. Pt has rolling walker for discharge home. Pt and pts nurse aware of discharge arrangements. [facility] to follow pt at discharge as well. Christinia Gully Polk, RN 10/24/2015, 9:13 AM

## 2015-10-24 NOTE — Discharge Summary (Signed)
Physician Discharge Summary  [name] MRN: [id number] DOB/AGE: Sep 17, 1946 69 y.o. Primary Care Physician:Clotilda Hafer L, MD Admit date: 10/15/2015 Discharge date: 10/24/2015    Discharge Diagnoses:   Principal Problem:   Acute diastolic CHF (congestive heart failure) ([place]) Active Problems:   Morbid obesity (HCC)   Dysphagia, pharyngoesophageal phase   Colon carcinoma (HCC)   Elevated liver enzymes   Peripheral edema   Generalized weakness   DVT (deep venous thrombosis) (HCC)   Atrial fibrillation (HCC)   Abnormal LFTs   Fecal incontinence   Altered mental status   Spondylosis, cervical, with myelopathy  seizure disorder    Medication List    TAKE these medications        ciprofloxacin 0.3 % ophthalmic solution  Commonly known as:  CILOXAN  Place 1 drop into the left eye every 4 (four) hours while awake.     ELIQUIS 5 MG Tabs tablet  Generic drug:  apixaban  TAKE 1 TABLET BY MOUTH TWICE A DAY     escitalopram 20 MG tablet  Commonly known as:  LEXAPRO  Take 1 tablet (20 mg total) by mouth daily.     furosemide 20 MG tablet  Commonly known as:  LASIX  Take 2 tablets (40 mg total) by mouth daily.     levETIRAcetam 500 MG tablet  Commonly known as:  KEPPRA  Take 1 tablet (500 mg total) by mouth 2 (two) times daily.     levofloxacin 500 MG tablet  Commonly known as:  LEVAQUIN  Take 1 tablet (500 mg total) by mouth daily.     lidocaine-prilocaine cream  Commonly known as:  EMLA  Apply a quarter size amount to port site 1 hour prior to chemo. Do not rub in. Cover with plastic wrap.     Linaclotide 145 MCG Caps capsule  Commonly known as:  LINZESS  1 PO 30 mins prior to your first meal     lisinopril 10 MG tablet  Commonly known as:  PRINIVIL,ZESTRIL  Take 1 tablet (10 mg total) by mouth daily.     nitroGLYCERIN 2 % ointment  Commonly known as:  NITROGLYN  Apply 0.5 inches topically as needed for chest pain. For rectal area      omeprazole 20 MG capsule  Commonly known as:  PRILOSEC  Take 1 capsule (20 mg total) by mouth daily.     potassium chloride SA 20 MEQ tablet  Commonly known as:  K-DUR,KLOR-CON  Take 1 tablet (20 mEq total) by mouth daily.     predniSONE 10 MG tablet  Commonly known as:  DELTASONE  43 days, 33 days, 23 days, 13 days and stop     psyllium 58.6 % packet  Commonly known as:  METAMUCIL  Take 1 packet by mouth daily.        Discharged Condition: Improved    Consults: Cardiology/gastroenterology/neurology  Significant Diagnostic Studies: Ct Head W Wo Contrast  10/19/2015  CLINICAL DATA:  Altered mental status. Decreased responsiveness since yesterday. Personal history of colon cancer this year. EXAM: CT HEAD WITHOUT AND WITH CONTRAST TECHNIQUE: Contiguous axial images were obtained from the base of the skull through the vertex without and with intravenous contrast CONTRAST:  48mL OMNIPAQUE IOHEXOL 300 MG/ML  SOLN COMPARISON:  Normal MRI of the brain for age 06/05/2015. FINDINGS: No acute infarct, hemorrhage, or mass lesion is present. Basal ganglia are intact. Insular ribbon and cerebral cortex is within normal limits. Brainstem and cerebellum are unremarkable. The postcontrast  images demonstrate no acute infarct or focal enhancing lesion. Ventricles are of normal size. No significant extra-axial fluid collection is present. The globes and orbits are intact. The paranasal sinuses and mastoid air cells are clear. Calvarium is within normal limits. IMPRESSION: Negative CT of the head without and with contrast. Electronically Signed   By: San Morelle M.D.   On: 10/19/2015 09:19   Mr Brain Wo Contrast  10/20/2015  CLINICAL DATA:  Mental status changes. Nonresponsive. History of colon cancer. EXAM: MRI HEAD WITHOUT CONTRAST TECHNIQUE: Multiplanar, multiecho pulse sequences of the brain and surrounding structures were obtained without intravenous contrast. COMPARISON:  CT head  10/19/15.  MR brain 10/05/2015. FINDINGS: Technique was used as the patient was unable to remain still for the exam. Overall study diagnostic. No evidence for acute infarction, hemorrhage, mass lesion, hydrocephalus, or extra-axial fluid. Generalized cerebral and cerebellar atrophy. Mild subcortical and periventricular T2 and FLAIR hyperintensities, likely chronic microvascular ischemic change. No obvious midline abnormality. Flow voids are maintained in the carotid, basilar, and both vertebral arteries. No extracranial soft tissue abnormality is seen. Good general agreement with prior CT and MRI. IMPRESSION: Atrophy. Mild small vessel disease. No acute intracranial findings. Electronically Signed   By: Staci Righter M.D.   On: 10/20/2015 09:07   Mr Jeri Cos ZJ Contrast  10/05/2015  CLINICAL DATA:  Weakness and syncope for 2 months. Dizziness. Personal history of colon cancer. EXAM: MRI HEAD WITHOUT AND WITH CONTRAST TECHNIQUE: Multiplanar, multiecho pulse sequences of the brain and surrounding structures were obtained without and with intravenous contrast. CONTRAST:  7mL MULTIHANCE GADOBENATE DIMEGLUMINE 529 MG/ML IV SOLN COMPARISON:  CT head without contrast 05/22/2013 FINDINGS: Mild atrophy is within normal limits for age. No acute infarct, hemorrhage, or mass lesion is present. The ventricles are of normal size. No significant extraaxial fluid collection is present. No significant white matter disease is present. The internal auditory canals and brainstem are normal. Flow is present in the major intracranial arteries. The globes and orbits are intact. Of mucosal thickening is present along the floor of the maxillary sinuses bilaterally. The remaining paranasal sinuses are clear. The skullbase is within normal limits. Midline structures are unremarkable. There is some motion degradation of the study, particularly on the postcontrast images. No pathologic enhancement is present to suggest metastatic disease to  the brain or meninges. IMPRESSION: 1. Normal MRI appearance of the brain for age. 2. Minimal maxillary sinus disease. Electronically Signed   By: San Morelle M.D.   On: 10/05/2015 10:11   Mr Cervical Spine Wo Contrast  10/16/2015  CLINICAL DATA:  Neck pain with bilateral arm numbness. History of colon cancer. Initial encounter. EXAM: MRI CERVICAL SPINE WITHOUT CONTRAST TECHNIQUE: Multiplanar, multisequence MR imaging of the cervical spine was performed. No intravenous contrast was administered. COMPARISON:  Cervical spine CT 05/22/2013. FINDINGS: The alignment is stable with a mild scoliosis. No evidence of acute fracture or paraspinal abnormality. No suspicious marrow lesions. The craniocervical junction appears normal. The cervical cord is normal in signal and caliber. There are bilateral vertebral artery flow voids. There is stable tracheal deviation the right at the thoracic inlet. C2-3:  Normal interspace. C3-4: Stable uncinate spurring bilaterally, contributing to mild narrowing of both foramina. No cord deformity. C4-5: There is moderate spondylosis with posterior osteophytes covering diffusely bulging disc material. There is an asymmetric disc osteophyte complex on the left which contributes to left-sided cord flattening and asymmetric narrowing of the left foramen. There is probable resulting left C5 nerve  root encroachment. The right foramen appears only mildly narrowed. C5-6: Chronic spondylosis with posterior osteophytes covering diffusely bulging disc material. The CSF surrounding the cord is partially effaced with narrowing of the AP diameter of the canal to 8 mm. Uncinate spurring contributes to mild to moderate foraminal narrowing bilaterally. C6-7: Small central disc protrusion. No cord deformity or foraminal compromise. C7-T1:  Normal interspace. IMPRESSION: 1. Asymmetric cord flattening on the left at C4-5 secondary to spondylosis and a superimposed left foraminal disc osteophyte  complex. Probable associated left C5 nerve root encroachment. 2. Spondylosis at C5-6 contributes to mild-to-moderate spinal stenosis and biforaminal narrowing. 3. Mild uncinate spurring at C3-4 with resulting mild biforaminal narrowing. 4. No evidence of metastatic disease. Electronically Signed   By: Richardean Sale M.D.   On: 10/16/2015 10:12   US Venous Img Lower Bilateral  10/15/2015  CLINICAL DATA:  69 year old male with bilateral lower extremity edema. Elevated D-dimer. History of congestive heart failure and morbid obesity. EXAM: BILATERAL LOWER EXTREMITY VENOUS DOPPLER ULTRASOUND TECHNIQUE: Gray-scale sonography with graded compression, as well as color Doppler and duplex ultrasound were performed to evaluate the lower extremity deep venous systems from the level of the common femoral vein and including the common femoral, femoral, profunda femoral, popliteal and calf veins including the posterior tibial, peroneal and gastrocnemius veins when visible. The superficial great saphenous vein was also interrogated. Spectral Doppler was utilized to evaluate flow at rest and with distal augmentation maneuvers in the common femoral, femoral and popliteal veins. COMPARISON:  None. FINDINGS: RIGHT LOWER EXTREMITY Common Femoral Vein: No evidence of thrombus. Normal compressibility, respiratory phasicity and response to augmentation. Saphenofemoral Junction: No evidence of thrombus. Normal compressibility and flow on color Doppler imaging. Profunda Femoral Vein: No evidence of thrombus. Normal compressibility and flow on color Doppler imaging. Femoral Vein: Positive for thrombus (nonocclusive). Partial compressibility and response to augmentation. Popliteal Vein: No evidence of thrombus. Normal compressibility, respiratory phasicity and response to augmentation. Calf Veins: Sub optimal evaluation. Superficial Great Saphenous Vein: No evidence of thrombus. Normal compressibility and flow on color Doppler imaging.  Venous Reflux:  None. Other Findings:  None. LEFT LOWER EXTREMITY Common Femoral Vein: No evidence of thrombus. Normal compressibility, respiratory phasicity and response to augmentation. Saphenofemoral Junction: No evidence of thrombus. Normal compressibility and flow on color Doppler imaging. Profunda Femoral Vein: No evidence of thrombus. Normal compressibility and flow on color Doppler imaging. Femoral Vein: No evidence of thrombus. Normal compressibility, respiratory phasicity and response to augmentation. Popliteal Vein: No evidence of thrombus. Normal compressibility, respiratory phasicity and response to augmentation. Calf Veins: Sub optimal evaluation. Superficial Great Saphenous Vein: No evidence of thrombus. Normal compressibility and flow on color Doppler imaging. Venous Reflux:  None. Other Findings:  None. IMPRESSION: 1. Study is positive for nonocclusive deep venous thrombosis in the right femoral vein. 2. No evidence of deep venous thrombosis in the left lower extremity. 3. Limited study which was unable to accurately evaluate the calf veins bilaterally. Electronically Signed   By: Vinnie Langton M.D.   On: 10/15/2015 10:17   Dg Chest Port 1 View  10/15/2015  CLINICAL DATA:  Atrial fibrillation. Morbid obesity. Right lower extremity DVT. EXAM: PORTABLE CHEST 1 VIEW COMPARISON:  05/10/2015 FINDINGS: Left-sided power port remains in appropriate position. Heart size is stable. Both lungs are clear. No evidence of pneumothorax or pleural effusion. IMPRESSION: No active disease. Electronically Signed   By: Earle Gell M.D.   On: 10/15/2015 11:58    Lab Results: Basic Metabolic  Panel:  Recent Labs  10/22/15 0626  NA 140  K 4.0  CL 107  CO2 30  GLUCOSE 93  BUN 14  CREATININE 0.89  CALCIUM 7.9*   Liver Function Tests: No results for input(s): AST, ALT, ALKPHOS, BILITOT, PROT, ALBUMIN in the last 72 hours.   CBC:  Recent Labs  10/22/15 0626 10/23/15 0650  WBC 4.9 5.6  HGB  11.8* 12.2*  HCT 35.4* 37.2*  MCV 103.5* 103.6*  PLT 96* 94*    Recent Results (from the past 240 hour(s))  C difficile quick scan w PCR reflex     Status: None   Collection Time: 10/17/15 10:55 PM  Result Value Ref Range Status   C Diff antigen NEGATIVE NEGATIVE Final   C Diff toxin NEGATIVE NEGATIVE Final   C Diff interpretation Negative for toxigenic C. difficile  Final  Culture, Urine     Status: None   Collection Time: 10/19/15  9:47 AM  Result Value Ref Range Status   Specimen Description URINE, CLEAN CATCH  Final   Special Requests NONE  Final   Culture   Final    >=100,000 COLONIES/mL ENTEROCOCCUS SPECIES Performed at [facility]    Report Status 10/21/2015 FINAL  Final   Organism ID, Bacteria ENTEROCOCCUS SPECIES  Final      Susceptibility   Enterococcus species - MIC*    AMPICILLIN <=2 SENSITIVE Sensitive     LEVOFLOXACIN 1 SENSITIVE Sensitive     NITROFURANTOIN <=16 SENSITIVE Sensitive     VANCOMYCIN 1 SENSITIVE Sensitive     * >=100,000 COLONIES/mL ENTEROCOCCUS SPECIES  MRSA PCR Screening     Status: None   Collection Time: 10/19/15  9:47 AM  Result Value Ref Range Status   MRSA by PCR NEGATIVE NEGATIVE Final    Comment:        The GeneXpert MRSA Assay (FDA approved for NASAL specimens only), is one component of a comprehensive MRSA colonization surveillance program. It is not intended to diagnose MRSA infection nor to guide or monitor treatment for MRSA infections.      Hospital Course: This is a 69 year old who came to the emergency department because of increasing edema. He has been being treated for heart failure and his dose of Lasix was increased but he developed increasing problems and came to the emergency department. He was found to have DVT in his right leg. He has been on a novel anticoagulant for atrial fibrillation but this has been held while he was being sent for colonoscopy. He was admitted with acute on chronic diastolic heart  failure and was diuresed. 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 He had an episode of altered mental status that appears to have been from seizure disorder. 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 By the time of discharge his mental status was better his heart failure was much better and he was overall improved.  Discharge Exam: Blood pressure 117/66, pulse 67, temperature 98.2 F (36.8 C), temperature source Oral, resp. rate 17, height 5\' 7"  (1.702 m), weight 112.855 kg (248 lb 12.8 oz), SpO2 99 %. He is awake and alert. He looks comfortable. His chest is clear. He is still in atrial fibrillation. His edema is much better  Disposition: Home with home health services. He will have a rolling walker at home. He will 10 mg of  Eliquis tonight and then go to 5 mg twice a day.      Discharge Instructions    Discharge patient    Complete by:  As directed      Face-to-face encounter (required for Medicare/Medicaid patients)    Complete by:  As directed   I Hatcher Froning L certify that this patient is under my care and that I, or a nurse practitioner or physician's assistant working with me, had a face-to-face encounter that meets the physician face-to-face encounter requirements with this patient on 10/24/2015. The encounter with the patient was in whole, or in part for the following medical condition(s) which is the primary reason for home health care (List medical condition): Acute congestive heart failure  The encounter with the patient was in whole, or in part, for the following medical condition, which is the primary reason for home health care:  Acute congestive heart failure  I certify that, based on my findings, the following services are medically necessary home health services:   Nursing Physical therapy    Reason for Medically Necessary Home Health  Services:  Skilled Nursing- Change/Decline in Patient Status  My clinical findings support the need for the above services:  Unable to leave home safely without assistance and/or assistive device  Further, I certify that my clinical findings support that this patient is homebound due to:  Unable to leave home safely without assistance     Home Health    Complete by:  As directed   To provide the following care/treatments:   PT RN             Follow-up Information    Follow up with [facility].   Contact information:   [place] [id number] [phone]       Signed: [name]   10/24/2015, 9:15 AM

## 2015-10-26 ENCOUNTER — Other Ambulatory Visit: Payer: Self-pay | Admitting: *Deleted

## 2015-10-26 NOTE — Patient Outreach (Addendum)
Attempt made to contact pt for transition of care (Discharged 10/25).  HIPPA compliant voice message left with contact number.  If no response, will try again tomorrow.     Zara Chess.   [place] [facility] Management  [phone]

## 2015-10-26 NOTE — Patient Outreach (Signed)
Another attempt made to contact pt today (post discharge) after receiving a voice message from coworker Burgess Amor RN CM (provided referral) that pt will be going out of town with spouse- [place].  Mary provided spouse's cell phone number to call.   HIPPA compliant voice message left with contact number.  If no response, will try again tomorrow.    Zara Chess.   [place] [facility] Management  [phone]

## 2015-10-27 ENCOUNTER — Other Ambulatory Visit: Payer: Self-pay | Admitting: *Deleted

## 2015-10-27 NOTE — Patient Outreach (Signed)
Another attempt made to contact pt as part of transition of care, discharged 10/25.   Coworker Burgess Amor RN CM (provided referral) informed this RN CM yesterday  that pt was going out of town with spouse - planned trip to [place], provided spouse's cell phone number to call.   HIPPA compliant voice message left with contact number.  If no response, plan to call again 10/31.      [name].   [place] Care Management  [phone]

## 2015-10-30 ENCOUNTER — Telehealth: Payer: Self-pay

## 2015-10-30 ENCOUNTER — Other Ambulatory Visit: Payer: Self-pay | Admitting: *Deleted

## 2015-10-30 ENCOUNTER — Encounter: Payer: Self-pay | Admitting: *Deleted

## 2015-10-30 MED ORDER — DICYCLOMINE HCL 10 MG PO CAPS: ORAL_CAPSULE | ORAL | Status: DC

## 2015-10-30 NOTE — Patient Outreach (Signed)
Transition of care call (pt discharged 10/25):  Spoke with spouse, HIPPA verified - as pt preferred RN CM speak with spouse.   Spouse reports pt is doing good, still does not have balance.  Spouse reports found dizziness is s/e of some of his medications, inquired if it would be better for pt to take them at night.  RN CM suggested spouse f/u with local pharmacist  to inquire and is still have questions, can refer pt to [facility] pharmacist.  Spouse states pt is scheduled to f/u with Dr. Luan Pulling 11/9 but had a previous appointment for 11/3, to call MD office and clarify.  Spouse states pt's weight at discharge was 248 lbs, been staying there with the exception of today- 250 lbs.  Spouse felt weight gain coming from the Prednisone, eating all the time, no swelling noted.  RN CM discussed [facility] services, f/u with weekly phone calls 31 days post discharge as well as doing a home visit.   Home visit scheduled for 11/7.   Plan to f/u with pt 11/7- home visit. Plan to inform Dr. Luan Pulling of [facility] involvement- letter to be sent.    Zara Chess.   [place] [facility] Management  [phone]

## 2015-10-30 NOTE — Telephone Encounter (Signed)
SPOKE WITH HAVING 1-3 SOFT STOOLS A DAY. LAST LEVAQUIN SAT. NO PROBIOTIC. EXPLAINED WHY HE MAY NOT SENSE HIS BMs OR BE ABLE TO CONTROL SOFT STOOLS: AGE, PRIOR SURGERY.  PLAN: 1. BENTYL EVERY DAY BEFORE BREAKFAST AND REPEAT BEFORE LUNCH IF HE HAS LOOSE STOOLS. 2. ADD LACTASE 3 WITH MEALS THREE TIMES A DAY 3. CONTINUE METAMUCIL 4.  CALL IN 7 DAYS IF SYMPTOMS ARE NOT IMPROVED. MAY NEED C DIFF PCR.

## 2015-10-30 NOTE — Telephone Encounter (Signed)
Pt wife called and states that pt has been having some diarrhea for the past 3 days. Wife states that pt is taking metamucil daily. Please advise.

## 2015-11-06 ENCOUNTER — Other Ambulatory Visit: Payer: Self-pay | Admitting: *Deleted

## 2015-11-06 ENCOUNTER — Encounter: Payer: Self-pay | Admitting: *Deleted

## 2015-11-07 NOTE — Patient Outreach (Signed)
[facility]) Care Management   Home visit done 11/06/2015  [name] 1946/06/12 [id number]  [name] is an 69 y.o. male  Subjective:  Spouse reports pt has difficulty walking, looses his balance.  Spouse states pt it to f/u with Dr.  Luan Pulling 11/9, going to ask MD about pt having outpatient PT since he is not eligible for [facility] PT- not  Homebound.  Pt reports his weight today was 246 lbs. - to which spouse states pt gained one lb from  Yesterday- had potato sticks.    Objective:   Filed Vitals:   11/06/15 1136  BP: 114/70  Pulse: 71  Resp: 16    ROS  Physical Exam  Constitutional: He appears well-developed and well-nourished.  Cardiovascular: Normal rate and regular rhythm.   Respiratory: Breath sounds normal.  GI: Soft. Bowel sounds are normal.    Current Medications:  Reviewed medications with pt's spouse  Current Outpatient Prescriptions  Medication Sig Dispense Refill  . ciprofloxacin (CILOXAN) 0.3 % ophthalmic solution Place 1 drop into the left eye every 4 (four) hours while awake.   0  . dicyclomine (BENTYL) 10 MG capsule 1 po 30 MINS BEFORE BREAKFAST AND MAY REPEAT BEFORE LUNCH. 60 capsule 11  . ELIQUIS 5 MG TABS tablet TAKE 1 TABLET BY MOUTH TWICE A DAY  12  . escitalopram (LEXAPRO) 20 MG tablet Take 1 tablet (20 mg total) by mouth daily. 30 tablet 2  . furosemide (LASIX) 20 MG tablet Take 2 tablets (40 mg total) by mouth daily. 90 tablet 3  . levETIRAcetam (KEPPRA) 500 MG tablet Take 1 tablet (500 mg total) by mouth 2 (two) times daily. 60 tablet 12  . Linaclotide (LINZESS) 145 MCG CAPS capsule 1 PO 30 mins prior to your first meal 30 capsule 11  . lisinopril (PRINIVIL,ZESTRIL) 10 MG tablet Take 1 tablet (10 mg total) by mouth daily. 30 tablet 12  . nitroGLYCERIN (NITROGLYN) 2 % ointment Apply 0.5 inches topically as needed for chest pain. For rectal area    . omeprazole (PRILOSEC) 20 MG capsule Take 1 capsule (20 mg total) by mouth  daily. 30 capsule 3  . potassium chloride SA (K-DUR,KLOR-CON) 20 MEQ tablet Take 1 tablet (20 mEq total) by mouth daily. 90 tablet 3  . psyllium (METAMUCIL) 58.6 % packet Take 1 packet by mouth daily.    [name] levofloxacin (LEVAQUIN) 500 MG tablet Take 1 tablet (500 mg total) by mouth daily. (Patient not taking: Reported on 10/30/2015) 3 tablet 0  . lidocaine-prilocaine (EMLA) cream Apply a quarter size amount to port site 1 hour prior to chemo. Do not rub in. Cover with plastic wrap. (Patient not taking: Reported on 10/30/2015) 30 g 3  . predniSONE (DELTASONE) 10 MG tablet 43 days, 33 days, 23 days, 13 days and stop (Patient not taking: Reported on 11/06/2015) 30 tablet 0   No current facility-administered medications for this visit.    Functional Status:   In your present state of health, do you have any difficulty performing the following activities: 11/06/2015 10/15/2015  Hearing? - N  Vision? - N  Difficulty concentrating or making decisions? - N  Walking or climbing stairs? - Y  Dressing or bathing? - N  Doing errands, shopping? - N  Conservation officer, nature and eating ? Y -  Using the Toilet? N -  In the past six months, have you accidently leaked urine? N -  Do you have problems with loss of bowel control? Y -  Managing your Medications? N -  Managing your Finances? N -  Housekeeping or managing your Housekeeping? Y -    Fall/Depression Screening:    PHQ 2/9 Scores 11/06/2015  PHQ - 2 Score 0    Assessment:   HF:  Spouse reports pt's weight up one pound from yesterday. Trace edema in left lower extremity.  Lungs clear.  Ongoing compliance with Low Na+ diet needed.   Provided and reviewed Emmi information (HF- how to be salt smart.  HF- About rest, exercise and BP) with pt and spouse.                             Diarrhea- spouse reports pt does not know he is going.  BRAT diet discussed.   Plan:  HF- pt to continue to weigh daily,record, report to MD weight gain of 3 lbs in a day/5 lbs  in a week                   Ongoing compliance with Low Na+ diet.            Diarrhea- pt to continue to take Bentyl as directed, spouse to discuss side effects at upcoming MD visit                              BRAT diet.             Pt to f/u with Dr. Luan Pulling 11/9.              RN CM to fax Dr. Luan Pulling 11/07 encounter through [place].                 RN CM to continue to follow pt for transition of care, f/u again telephonically 11/14.     [name].   [place] Care Management  [phone]

## 2015-11-13 ENCOUNTER — Other Ambulatory Visit: Payer: Self-pay | Admitting: *Deleted

## 2015-11-13 NOTE — Patient Outreach (Signed)
Transition of care call:  Spoke  with pt's spouse who reports pt f/u with Dr. Luan Pulling, spouse discussed with MD  pt has no control over soft stool to which MD said  give the medication a chance to work.  MD also said if no improvement, call Dr. Oneida Alar.   Spouse states pt to f/u with Dr. Luan Pulling again 12/8.  Spouse states MD is suppose to set up outpatient physical therapy for pt.  Spouse states pt's weights are staying the same, 246 lbs,  No swelling.   As discussed with  Spouse, plan to f/u again telephonically 11/21 as part of ongoing transition of care.   Zara Chess.   [place] Care Management  [phone]

## 2015-11-20 ENCOUNTER — Other Ambulatory Visit: Payer: Self-pay | Admitting: *Deleted

## 2015-11-20 NOTE — Patient Outreach (Signed)
Transition of care call (discharged 10/25):  Spoke with spouse (on [facility] consent form), reports pt's bowels better, had 2 bowel movements and can tell when he has to go.  Spouse reports no change in pt's balance.  Spouse reports weights are staying at 244 lbs., no sob/chest pain/swelling.   Spouse reports pt's appetite good.   As discussed with pt's spouse, plan to call again 11/28 (final transition of care).     Zara Chess.   [place] [facility] Management  [phone]

## 2015-11-27 ENCOUNTER — Other Ambulatory Visit: Payer: Self-pay | Admitting: *Deleted

## 2015-11-27 NOTE — Patient Outreach (Signed)
Final transition of care call (discharged 10/25):  Spoke with pt who gave the phone to spouse  [name] (on [facility] consent form).  Spouse states pt is doing good, still 244 lbs, trying to watch his salt.  Pt states his balance is not better, still not heard about HH PT, to call MD tomorrow.   Spouse states pt is doing home exercises.  Spouse reports pt is finally getting control of his bowels.   As discussed with spouse, plan to do a home visit with pt on 12/6- provide community nurse case management services.     [name].   [place] Care Management  [phone]

## 2015-12-05 ENCOUNTER — Ambulatory Visit: Payer: Self-pay | Admitting: *Deleted

## 2015-12-11 ENCOUNTER — Ambulatory Visit: Payer: Self-pay | Admitting: *Deleted

## 2015-12-11 NOTE — Progress Notes (Signed)
Peter Bogus, MD [name] [id number]  Stage IIA adenocarcinoma of the colon  CURRENT THERAPY: Observation  INTERVAL HISTORY: [name] 69 y.o. male returns for followup of Stage IIA adenocarcinoma of colon, S/P definitive surgery by Dr. Arnoldo Morale on 04/03/2015. He completed 7 cycles of adjuvant FOLFOX. He has had multiple medical issues this past year including a recent hospitalization in October where he was diagnosed with a seizure d/o.  He is here today for ongoing f/u. He is finally stating he feels well. Appetite is off/on. He is using a walker but independent in most of his ADL's. 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 He is getting ready to start PT/OT.  He has had recent imaging and colonoscopy.    Colon carcinoma ([place])   03/28/2015 Initial Biopsy Diagnosis 1. Colon, biopsy, left descending - INVASIVE ADENOCARCINOMA. 2. Stomach, biopsy - GASTRIC BODY AND ANTRAL-TYPE MUCOSA WITH ASSOCIATED MINIMAL CHRONIC INFLAMMATION. - NO EVIDENCE OF HELICOBACTER PYLORI, INTESTINAL METAPLASIA, DYSPLASIA OR MA   03/29/2015 Imaging CT abd/pelvis- Circumferential mucosal lesion the proximal sigmoid colon consists with colorectal carcinoma.   04/03/2015 Definitive Surgery Colon, segmental resection for tumor - INVASIVE ADENOCARCINOMA, INVADING THROUGH THE MUSCULARIS PROPRIA INTO PERICOLONIC FATTY TISSUE. - FOURTEEN LYMPH NODES, NEGATIVE FOR METASTATIC CARCINOMA (0/14). - RESECTION MARGINS, NEGATIVE FOR ATYPIA OR MALIGNA   05/16/2015 - 08/22/2015 Chemotherapy FOLFOX    05/31/2015 Treatment Plan Change Oxaliplatin dose reduced by 20% secondary to fatigue from cycle 1 of therapy.   07/24/2015 Treatment Plan Change 5 FU bolus D/C'd for cycle 5 and subsequent cycles due to thrombocytopenia.   10/03/2015 Procedure colonoscopy with verrucous anal canal lesion, final path no malignancy   10/05/2015 Imaging MRI brain, normal for age   10/15/2015 - 10/24/2015 Hospital Admission Diastolic CHF, seizure D/O, DVT    Malignant neoplasm of colon [facility])    Initial Diagnosis Malignant neoplasm of colon [facility])     Past Medical History  Diagnosis Date  . Varicose veins   . New onset atrial fibrillation ([place]) 01/02/2015  . Morbid obesity ([place]) 01/02/2015  . Hypertension   . Sleep apnea     been tested but has not received the CPAP yet  . GERD (gastroesophageal reflux disease)   . History of gout   . Cancer ([place]) 02/2015    colon  . Family history of colon cancer   . Family history of breast cancer in mother     has Varicose veins of lower extremities with other complications; New onset atrial fibrillation ([place]); Morbid obesity ([place]); Melena; Heme positive stool; Esophageal dysphagia; Diarrhea; Occult blood in stools; Dysphagia, pharyngoesophageal phase; Colon carcinoma ([place]); Family history of colon cancer; Family history of breast cancer in mother; Cellulitis; Constipation; Elevated liver enzymes; Colon cancer ([place]); Change in bowel habits; Malignant neoplasm of colon ([place]); Peripheral edema; Acute diastolic CHF (congestive heart failure) ([place]); Generalized weakness; DVT (deep venous thrombosis) ([place]); Atrial fibrillation ([place]); Abnormal LFTs; Fecal incontinence; Altered mental status; and Spondylosis, cervical, with myelopathy on his problem list.   He believes that his sister died of heart issues, she died in her sleep.   has No Known Allergies.  Current Outpatient Prescriptions on File Prior to Visit  Medication Sig Dispense Refill  . dicyclomine (BENTYL) 10 MG capsule 1 po 30 MINS BEFORE BREAKFAST AND MAY REPEAT BEFORE LUNCH. 60 capsule 11  . ELIQUIS 5 MG TABS tablet TAKE 1 TABLET  BY MOUTH TWICE A DAY  12  . escitalopram (LEXAPRO) 20 MG tablet Take 1 tablet (20 mg total) by mouth daily. 30 tablet 2  . levETIRAcetam (KEPPRA) 500 MG tablet Take 1 tablet (500 mg total) by mouth 2 (two) times daily. 60 tablet 12  .  lidocaine-prilocaine (EMLA) cream Apply a quarter size amount to port site 1 hour prior to chemo. Do not rub in. Cover with plastic wrap. 30 g 3  . Linaclotide (LINZESS) 145 MCG CAPS capsule 1 PO 30 mins prior to your first meal 30 capsule 11  . lisinopril (PRINIVIL,ZESTRIL) 10 MG tablet Take 1 tablet (10 mg total) by mouth daily. 30 tablet 12  . omeprazole (PRILOSEC) 20 MG capsule Take 1 capsule (20 mg total) by mouth daily. 30 capsule 3  . potassium chloride SA (K-DUR,KLOR-CON) 20 MEQ tablet Take 1 tablet (20 mEq total) by mouth daily. 90 tablet 3  . psyllium (METAMUCIL) 58.6 % packet Take 1 packet by mouth daily.    . ciprofloxacin (CILOXAN) 0.3 % ophthalmic solution Place 1 drop into the left eye every 4 (four) hours while awake.   0  . nitroGLYCERIN (NITROGLYN) 2 % ointment Apply 0.5 inches topically as needed for chest pain. For rectal area    . predniSONE (DELTASONE) 10 MG tablet 43 days, 33 days, 23 days, 13 days and stop (Patient not taking: Reported on 11/06/2015) 30 tablet 0   No current facility-administered medications on file prior to visit.    Past Surgical History  Procedure Laterality Date  . Vein ligation and stripping      left leg  . Colonoscopy N/A 03/28/2015    SLF: 1. Abdominal pain, diarrhea, rectal bleeding due to obstructing colon mass  . Esophagogastroduodenoscopy N/A 03/28/2015    SLF: 1. dysphagia 2. Mild non-erosive gastritis.   . Esophageal dilation N/A 03/28/2015    Procedure: ESOPHAGEAL DILATION;  Surgeon: [name], MD;  Location: [facility] ENDO SUITE;  Service: Endoscopy;  Laterality: N/A;  . Partial colectomy N/A 04/03/2015    Procedure: PARTIAL COLECTOMY;  Surgeon: [name], MD;  Location: [facility] ORS;  Service: General;  Laterality: N/A;  . Portacath placement N/A 05/10/2015    Procedure: INSERTION PORT-A-CATH;  Surgeon: [name], MD;  Location: [facility] ORS;  Service: General;  Laterality: N/A;  left subclavian  . Skin cancer destruction Left 08/16/15     left side of nose and left back  . Colonoscopy N/A 10/03/2015    SLF: normal anastomosis, fair prep (polyps less than 1cm could be missed), anal fissures and verrucous anal canal lesion with benign biopsy. Next TCS in 09/2016.  . Colonoscopy N/A 10/02/2015    SLF: normal anastomosis, poor bowel prep, three anal fissures    Denies any headaches, dizziness, double vision, fevers, chills, night sweats, nausea, vomiting, diarrhea, constipation, chest pain, heart palpitations, shortness of breath, blood in stool, black tarry stool, urinary pain, urinary burning, urinary frequency, hematuria. Positive for dizziness, diarrhea 14 point review of systems was performed and is negative except as detailed under history of present illness and above    PHYSICAL EXAMINATION  ECOG PERFORMANCE STATUS: 1 - Symptomatic but completely ambulatory  Filed Vitals:   12/12/15 1131  BP: 99/49  Pulse: 72  Temp: 97.5 F (36.4 C)  Resp: 20    GENERAL:alert, no distress, well nourished, well developed,, cooperative, obese. He is smiling and very talkative he appears fatigued. In a wheelchair. SKIN:  texture, turgor are normal, no rashes, +  icterus HEAD: Normocephalic, No masses, lesions, tenderness or abnormalities EYES: normal, PERRLA, EOMI, Conjunctiva are pink and non-injected. Mild left eye drainage. EARS: External ears normal OROPHARYNX:lips, buccal mucosa, and tongue normal and mucous membranes are moist  NECK: supple, thyroid normal size, non-tender, without nodularity, trachea midline LYMPH:no palpable adenopathy in the neck or supraclavicular regions. BREAST:not examined LUNGS: clear to auscultation  HEART: regular rate & rhythm ABDOMEN:abdomen soft, obese and normal bowel sounds BACK: Back symmetric, no curvature. EXTREMITIES:less then 2 second capillary refill, no joint deformities, effusion, or inflammation, no skin discoloration  NEURO: alert & oriented x 3 with fluent speech, no focal  motor/sensory deficits CN II - XII appear grossly intact  LABORATORY DATA: I have reviewed the data as listed.  Results for CHANNIN, AGUSTIN (MRN [id number])   Ref. Range 12/12/2015 12:51  Sodium Latest Ref Range: 135-145 mmol/L 138  Potassium Latest Ref Range: 3.5-5.1 mmol/L 3.3 (L)  Chloride Latest Ref Range: 101-111 mmol/L 109  CO2 Latest Ref Range: 22-32 mmol/L 26  BUN Latest Ref Range: 6-20 mg/dL 14  Creatinine Latest Ref Range: 0.61-1.24 mg/dL 0.69  Calcium Latest Ref Range: 8.9-10.3 mg/dL 7.7 (L)  EGFR (Non-African Amer.) Latest Ref Range: >60 mL/min >60  EGFR (African American) Latest Ref Range: >60 mL/min >60  Glucose Latest Ref Range: 65-99 mg/dL 107 (H)  Anion gap Latest Ref Range: 5-15  3 (L)  Alkaline Phosphatase Latest Ref Range: 38-126 U/L 104  Albumin Latest Ref Range: 3.5-5.0 g/dL 2.3 (L)  AST Latest Ref Range: 15-41 U/L 34  ALT Latest Ref Range: 17-63 U/L 16 (L)  Total Protein Latest Ref Range: 6.5-8.1 g/dL 5.6 (L)  Total Bilirubin Latest Ref Range: 0.3-1.2 mg/dL 1.5 (H)  WBC Latest Ref Range: 4.0-10.5 K/uL 6.2  RBC Latest Ref Range: 4.22-5.81 MIL/uL 3.34 (L)  Hemoglobin Latest Ref Range: 13.0-17.0 g/dL 11.5 (L)  HCT Latest Ref Range: 39.0-52.0 % 32.4 (L)  MCV Latest Ref Range: 78.0-100.0 fL 97.0  MCH Latest Ref Range: 26.0-34.0 pg 34.4 (H)  MCHC Latest Ref Range: 30.0-36.0 g/dL 35.5  RDW Latest Ref Range: 11.5-15.5 % 13.5  Platelets Latest Ref Range: 150-400 K/uL 101 (L)  Neutrophils Latest Units: % 67  Lymphocytes Latest Units: % 16  Monocytes Relative Latest Units: % 13  Eosinophil Latest Units: % 4  Basophil Latest Units: % 0  NEUT# Latest Ref Range: 1.7-7.7 K/uL 4.1  Lymphocyte # Latest Ref Range: 0.7-4.0 K/uL 1.0  Monocyte # Latest Ref Range: 0.1-1.0 K/uL 0.8  Eosinophils Absolute Latest Ref Range: 0.0-0.7 K/uL 0.2  Basophils Absolute Latest Ref Range: 0.0-0.1 K/uL 0.0   RADIOLOGY I have personally reviewed the radiological images as listed and  agreed with the findings in the report.  CLINICAL DATA: Mental status changes. Nonresponsive. History of colon cancer.  EXAM: MRI HEAD WITHOUT CONTRAST  TECHNIQUE: Multiplanar, multiecho pulse sequences of the brain and surrounding structures were obtained without intravenous contrast.  COMPARISON: CT head 10/19/15. MR brain 10/05/2015.  FINDINGS: Technique was used as the patient was unable to remain still for the exam. Overall study diagnostic.  No evidence for acute infarction, hemorrhage, mass lesion, hydrocephalus, or extra-axial fluid. Generalized cerebral and cerebellar atrophy. Mild subcortical and periventricular T2 and FLAIR hyperintensities, likely chronic microvascular ischemic change. No obvious midline abnormality.  Flow voids are maintained in the carotid, basilar, and both vertebral arteries. No extracranial soft tissue abnormality is seen.  Good general agreement with prior CT and MRI.  IMPRESSION: Atrophy. Mild small vessel disease. No  acute intracranial findings.   Electronically Signed  By: Staci Righter M.D.  On: 10/20/2015 09:07  ASSESSMENT AND PLAN:  Stage II colorectal cancer Adjuvant FOLFOX Family history of colon cancer Situational depression/anxiety Treatment related fatigue Elevated AST Hyperbilirubinemia, predominantly indirect, gilbert's Dizziness/Falls Seizure D/O Diastolic CHF Weight Loss Thrombocytopenia  He was in the hospital in October and quite ill.  He has improved dramatically, issues remain ambulation and he is currently using a walker but notes he is becoming more active.  He is getting ready to restart PT/OT.  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 It does not limit him.  I will see him back in 3 months. At that point we can discuss ongoing follow-up scheduling. He had CT Imaging in September and a Colonoscopy in October.    Labs were reviewed and show a mild  thrombocytopenia and anemia, this will be monitored moving forward.   All questions were answered. The patient knows to call the clinic with any problems, questions or concerns. We can certainly see the patient much sooner if necessary.   This document serves as a record of services personally performed by Ancil Linsey, MD. It was created on her behalf by Arlyce Harman, a trained medical scribe. The creation of this record is based on the scribe's personal observations and the provider's statements to them. This document has been checked and approved by the attending provider.  I have reviewed the above documentation for accuracy and completeness, and I agree with the above.  This note was electronically signed.  Kelby Fam. Whitney Muse, MD

## 2015-12-12 ENCOUNTER — Encounter (HOSPITAL_COMMUNITY): Payer: Medicare Other | Attending: Hematology & Oncology | Admitting: Hematology & Oncology

## 2015-12-12 ENCOUNTER — Encounter (HOSPITAL_COMMUNITY): Payer: Self-pay | Admitting: Hematology & Oncology

## 2015-12-12 ENCOUNTER — Encounter (HOSPITAL_COMMUNITY): Payer: Medicare Other

## 2015-12-12 VITALS — BP 99/49 | HR 72 | Temp 97.5°F | Resp 20 | Wt 251.4 lb

## 2015-12-12 DIAGNOSIS — D696 Thrombocytopenia, unspecified: Secondary | ICD-10-CM | POA: Diagnosis not present

## 2015-12-12 DIAGNOSIS — C187 Malignant neoplasm of sigmoid colon: Secondary | ICD-10-CM | POA: Diagnosis not present

## 2015-12-12 DIAGNOSIS — D649 Anemia, unspecified: Secondary | ICD-10-CM | POA: Diagnosis not present

## 2015-12-12 DIAGNOSIS — R195 Other fecal abnormalities: Secondary | ICD-10-CM | POA: Insufficient documentation

## 2015-12-12 DIAGNOSIS — G40909 Epilepsy, unspecified, not intractable, without status epilepticus: Secondary | ICD-10-CM

## 2015-12-12 DIAGNOSIS — C19 Malignant neoplasm of rectosigmoid junction: Secondary | ICD-10-CM | POA: Diagnosis present

## 2015-12-12 DIAGNOSIS — C189 Malignant neoplasm of colon, unspecified: Secondary | ICD-10-CM

## 2015-12-12 DIAGNOSIS — F418 Other specified anxiety disorders: Secondary | ICD-10-CM

## 2015-12-12 LAB — CBC WITH DIFFERENTIAL/PLATELET
BASOS ABS: 0 10*3/uL (ref 0.0–0.1)
BASOS PCT: 0 %
EOS ABS: 0.2 10*3/uL (ref 0.0–0.7)
EOS PCT: 4 %
HCT: 32.4 % — ABNORMAL LOW (ref 39.0–52.0)
HEMOGLOBIN: 11.5 g/dL — AB (ref 13.0–17.0)
Lymphocytes Relative: 16 %
Lymphs Abs: 1 10*3/uL (ref 0.7–4.0)
MCH: 34.4 pg — ABNORMAL HIGH (ref 26.0–34.0)
MCHC: 35.5 g/dL (ref 30.0–36.0)
MCV: 97 fL (ref 78.0–100.0)
Monocytes Absolute: 0.8 10*3/uL (ref 0.1–1.0)
Monocytes Relative: 13 %
NEUTROS PCT: 67 %
Neutro Abs: 4.1 10*3/uL (ref 1.7–7.7)
PLATELETS: 101 10*3/uL — AB (ref 150–400)
RBC: 3.34 MIL/uL — AB (ref 4.22–5.81)
RDW: 13.5 % (ref 11.5–15.5)
WBC: 6.2 10*3/uL (ref 4.0–10.5)

## 2015-12-12 LAB — COMPREHENSIVE METABOLIC PANEL
ALBUMIN: 2.3 g/dL — AB (ref 3.5–5.0)
ALK PHOS: 104 U/L (ref 38–126)
ALT: 16 U/L — AB (ref 17–63)
AST: 34 U/L (ref 15–41)
Anion gap: 3 — ABNORMAL LOW (ref 5–15)
BUN: 14 mg/dL (ref 6–20)
CHLORIDE: 109 mmol/L (ref 101–111)
CO2: 26 mmol/L (ref 22–32)
CREATININE: 0.69 mg/dL (ref 0.61–1.24)
Calcium: 7.7 mg/dL — ABNORMAL LOW (ref 8.9–10.3)
GFR calc non Af Amer: >60 mL/min (ref >60)
GLUCOSE: 107 mg/dL — AB (ref 65–99)
Potassium: 3.3 mmol/L — ABNORMAL LOW (ref 3.5–5.1)
SODIUM: 138 mmol/L (ref 135–145)
Total Bilirubin: 1.5 mg/dL — ABNORMAL HIGH (ref 0.3–1.2)
Total Protein: 5.6 g/dL — ABNORMAL LOW (ref 6.5–8.1)

## 2015-12-12 MED ORDER — HEPARIN SOD (PORK) LOCK FLUSH 100 UNIT/ML IV SOLN
INTRAVENOUS | Status: AC
  Filled 2015-12-12: qty 5

## 2015-12-12 MED ORDER — HEPARIN SOD (PORK) LOCK FLUSH 100 UNIT/ML IV SOLN
500.0000 [IU] | Freq: Once | INTRAVENOUS | Status: AC
  Administered 2015-12-12: 500 [IU] via INTRAVENOUS

## 2015-12-12 MED ORDER — SODIUM CHLORIDE 0.9 % IJ SOLN
10.0000 mL | INTRAMUSCULAR | Status: DC | PRN
  Administered 2015-12-12: 10 mL via INTRAVENOUS
  Filled 2015-12-12: qty 10

## 2015-12-12 NOTE — Patient Instructions (Addendum)
[place] at [facility] Discharge Instructions  RECOMMENDATIONS MADE BY THE CONSULTANT AND ANY TEST RESULTS WILL BE SENT TO YOUR REFERRING PHYSICIAN.   Exam completed today Port flush today with blood work Port flush every 8 weeks Return to see the doctor in 3 months We will talk about when to set up scans then If you have any questions or concerns please call the clinic    Thank you for choosing [place] at [facility] to provide your oncology and hematology care.  To afford each patient quality time with our provider, please arrive at least 15 minutes before your scheduled appointment time.    You need to re-schedule your appointment should you arrive 10 or more minutes late.  We strive to give you quality time with our providers, and arriving late affects you and other patients whose appointments are after yours.  Also, if you no show three or more times for appointments you may be dismissed from the clinic at the providers discretion.     Again, thank you for choosing [facility].  Our hope is that these requests will decrease the amount of time that you wait before being seen by our physicians.       _____________________________________________________________  Should you have questions after your visit to [facility], please contact our office at [phone] between the hours of 8:30 a.m. and 4:30 p.m.  Voicemails left after 4:30 p.m. will not be returned until the following business day.  For prescription refill requests, have your pharmacy contact our office.

## 2015-12-12 NOTE — Progress Notes (Signed)
[name] presented for Portacath access and flush.  Proper placement of portacath confirmed by CXR.  Portacath located left chest wall accessed with  H 20 needle.  Good blood return present. Portacath flushed with 74ml NS and 500U/59ml Heparin and needle removed intact.  Procedure tolerated well and without incident.

## 2015-12-14 ENCOUNTER — Encounter (HOSPITAL_COMMUNITY): Payer: Self-pay | Admitting: Emergency Medicine

## 2015-12-14 ENCOUNTER — Emergency Department (HOSPITAL_COMMUNITY): Payer: Medicare Other

## 2015-12-14 ENCOUNTER — Other Ambulatory Visit: Payer: Self-pay | Admitting: *Deleted

## 2015-12-14 ENCOUNTER — Other Ambulatory Visit: Payer: Self-pay

## 2015-12-14 ENCOUNTER — Emergency Department (HOSPITAL_COMMUNITY)
Admission: EM | Admit: 2015-12-14 | Discharge: 2015-12-14 | Disposition: A | Discharge status: Home / Self Care | Payer: Medicare Other | Attending: Emergency Medicine | Admitting: Emergency Medicine

## 2015-12-14 DIAGNOSIS — R062 Wheezing: Secondary | ICD-10-CM | POA: Diagnosis present

## 2015-12-14 DIAGNOSIS — I10 Essential (primary) hypertension: Secondary | ICD-10-CM | POA: Insufficient documentation

## 2015-12-14 DIAGNOSIS — J40 Bronchitis, not specified as acute or chronic: Secondary | ICD-10-CM | POA: Insufficient documentation

## 2015-12-14 DIAGNOSIS — Z7901 Long term (current) use of anticoagulants: Secondary | ICD-10-CM | POA: Diagnosis not present

## 2015-12-14 DIAGNOSIS — K219 Gastro-esophageal reflux disease without esophagitis: Secondary | ICD-10-CM | POA: Insufficient documentation

## 2015-12-14 DIAGNOSIS — J9801 Acute bronchospasm: Secondary | ICD-10-CM | POA: Diagnosis not present

## 2015-12-14 DIAGNOSIS — Z85038 Personal history of other malignant neoplasm of large intestine: Secondary | ICD-10-CM | POA: Diagnosis not present

## 2015-12-14 DIAGNOSIS — M109 Gout, unspecified: Secondary | ICD-10-CM | POA: Insufficient documentation

## 2015-12-14 DIAGNOSIS — I4891 Unspecified atrial fibrillation: Secondary | ICD-10-CM | POA: Insufficient documentation

## 2015-12-14 DIAGNOSIS — Z79899 Other long term (current) drug therapy: Secondary | ICD-10-CM | POA: Diagnosis not present

## 2015-12-14 LAB — CBC WITH DIFFERENTIAL/PLATELET
Basophils Absolute: 0 10*3/uL (ref 0.0–0.1)
Basophils Relative: 0 %
EOS ABS: 0.4 10*3/uL (ref 0.0–0.7)
Eosinophils Relative: 7 %
HCT: 35 % — ABNORMAL LOW (ref 39.0–52.0)
HEMOGLOBIN: 12.2 g/dL — AB (ref 13.0–17.0)
LYMPHS ABS: 1.8 10*3/uL (ref 0.7–4.0)
LYMPHS PCT: 32 %
MCH: 34.1 pg — AB (ref 26.0–34.0)
MCHC: 34.9 g/dL (ref 30.0–36.0)
MCV: 97.8 fL (ref 78.0–100.0)
Monocytes Absolute: 0.6 10*3/uL (ref 0.1–1.0)
Monocytes Relative: 11 %
NEUTROS PCT: 50 %
Neutro Abs: 2.8 10*3/uL (ref 1.7–7.7)
Platelets: 116 10*3/uL — ABNORMAL LOW (ref 150–400)
RBC: 3.58 MIL/uL — AB (ref 4.22–5.81)
RDW: 13.6 % (ref 11.5–15.5)
WBC: 5.5 10*3/uL (ref 4.0–10.5)

## 2015-12-14 LAB — COMPREHENSIVE METABOLIC PANEL
ALK PHOS: 127 U/L — AB (ref 38–126)
ALT: 19 U/L (ref 17–63)
AST: 40 U/L (ref 15–41)
Albumin: 2.6 g/dL — ABNORMAL LOW (ref 3.5–5.0)
Anion gap: 4 — ABNORMAL LOW (ref 5–15)
BUN: 14 mg/dL (ref 6–20)
CALCIUM: 8.7 mg/dL — AB (ref 8.9–10.3)
CO2: 28 mmol/L (ref 22–32)
CREATININE: 0.89 mg/dL (ref 0.61–1.24)
Chloride: 103 mmol/L (ref 101–111)
GFR calc non Af Amer: >60 mL/min (ref >60)
GLUCOSE: 112 mg/dL — AB (ref 65–99)
Potassium: 4.3 mmol/L (ref 3.5–5.1)
SODIUM: 135 mmol/L (ref 135–145)
Total Bilirubin: 1.2 mg/dL (ref 0.3–1.2)
Total Protein: 6.4 g/dL — ABNORMAL LOW (ref 6.5–8.1)

## 2015-12-14 LAB — BRAIN NATRIURETIC PEPTIDE: B Natriuretic Peptide: 239 pg/mL — ABNORMAL HIGH (ref 0.0–100.0)

## 2015-12-14 LAB — I-STAT CG4 LACTIC ACID, ED: Lactic Acid, Venous: 1.83 mmol/L (ref 0.5–2.0)

## 2015-12-14 LAB — I-STAT TROPONIN, ED: TROPONIN I, POC: 0.01 ng/mL (ref 0.00–0.08)

## 2015-12-14 MED ORDER — METHYLPREDNISOLONE SODIUM SUCC 125 MG IJ SOLR
125.0000 mg | Freq: Once | INTRAMUSCULAR | Status: AC
  Administered 2015-12-14: 125 mg via INTRAVENOUS
  Filled 2015-12-14: qty 2

## 2015-12-14 MED ORDER — ALBUTEROL SULFATE HFA 108 (90 BASE) MCG/ACT IN AERS
2.0000 | INHALATION_SPRAY | RESPIRATORY_TRACT | Status: DC | PRN
  Administered 2015-12-14: 2 via RESPIRATORY_TRACT
  Filled 2015-12-14: qty 6.7

## 2015-12-14 MED ORDER — IPRATROPIUM-ALBUTEROL 0.5-2.5 (3) MG/3ML IN SOLN
3.0000 mL | Freq: Once | RESPIRATORY_TRACT | Status: AC
  Administered 2015-12-14: 3 mL via RESPIRATORY_TRACT
  Filled 2015-12-14: qty 3

## 2015-12-14 MED ORDER — AZITHROMYCIN 250 MG PO TABS
500.0000 mg | ORAL_TABLET | Freq: Once | ORAL | Status: AC
  Administered 2015-12-14: 500 mg via ORAL
  Filled 2015-12-14: qty 2

## 2015-12-14 MED ORDER — ALBUTEROL SULFATE (2.5 MG/3ML) 0.083% IN NEBU
2.5000 mg | INHALATION_SOLUTION | Freq: Once | RESPIRATORY_TRACT | Status: AC
  Administered 2015-12-14: 2.5 mg via RESPIRATORY_TRACT
  Filled 2015-12-14: qty 3

## 2015-12-14 NOTE — Patient Outreach (Signed)
Received a call from pt's spouse, states left three messages with MD office (schedule urgent office visit for pt), no return call yet, office to close soon.    Spouse inquired about taking  pt to ED to which RN CM encouraged her to do so (assess lungs-cough/congestion).   As requested, spouse to keep RN CM updated.     Zara Chess.   [place] Care Management  [phone]

## 2015-12-14 NOTE — Discharge Instructions (Signed)
Follow up with your md next week. °

## 2015-12-14 NOTE — Patient Outreach (Signed)
[name]) Care Management   12/14/2015  [name] 1946/01/11 [id number]  [name] is an 69 y.o. male  Subjective:  Pt reports f/u with oncologist 12/13, good report. Pt states weight today is 243 lbs., ranges  242-244 lbs.  Pt reports balance is not better, starts therapy after the January first plus will also be getting Occupational therapy (numbness in fingers).  Pt reports been dealing with a cough for >week, getting worse, wakes him up, little weakness.  Pt reports saw Dr. Luan Pulling 12/8, MD told him to get OTC cough medication for his cough which has been taking Delsym for a week,not helping. Spouse reports at Dr. Luan Pulling office visit, pt's lungs were clear.   Objective:   Filed Vitals:   12/14/15 1202  BP: 90/62  Pulse: 72  Resp: 16    ROS  Physical Exam  Constitutional: He is oriented to person, place, and time. He appears well-developed and well-nourished.  Cardiovascular: Normal rate and regular rhythm.   Respiratory:  Congestion noted in all fields upon expiration.    GI: Soft. Bowel sounds are normal.  Musculoskeletal: Normal range of motion. He exhibits edema.  1-2+ edema left leg,+1 left ankle.   Neurological: He is alert and oriented to person, place, and time.  Skin: Skin is warm and dry.  Psychiatric: He has a normal mood and affect. His behavior is normal. Judgment and thought content normal.    Current Medications:  Reviewed with spouse  Current Outpatient Prescriptions  Medication Sig Dispense Refill  . ELIQUIS 5 MG TABS tablet TAKE 1 TABLET BY MOUTH TWICE A DAY  12  . escitalopram (LEXAPRO) 20 MG tablet Take 1 tablet (20 mg total) by mouth daily. 30 tablet 2  . furosemide (LASIX) 40 MG tablet 40 mg daily.  12  . levETIRAcetam (KEPPRA) 500 MG tablet Take 1 tablet (500 mg total) by mouth 2 (two) times daily. 60 tablet 12  . lidocaine-prilocaine (EMLA) cream Apply a quarter size amount to port site 1 hour prior to chemo.  Do not rub in. Cover with plastic wrap. 30 g 3  . lisinopril (PRINIVIL,ZESTRIL) 10 MG tablet Take 1 tablet (10 mg total) by mouth daily. 30 tablet 12  . nitroGLYCERIN (NITROGLYN) 2 % ointment Apply 0.5 inches topically as needed for chest pain. For rectal area    . omeprazole (PRILOSEC) 20 MG capsule Take 1 capsule (20 mg total) by mouth daily. 30 capsule 3  . potassium chloride SA (K-DUR,KLOR-CON) 20 MEQ tablet Take 1 tablet (20 mEq total) by mouth daily. 90 tablet 3  . ciprofloxacin (CILOXAN) 0.3 % ophthalmic solution Place 1 drop into the left eye every 4 (four) hours while awake. Reported on 12/14/2015  0  . dicyclomine (BENTYL) 10 MG capsule 1 po 30 MINS BEFORE BREAKFAST AND MAY REPEAT BEFORE LUNCH. (Patient not taking: Reported on 12/14/2015) 60 capsule 11  . Linaclotide (LINZESS) 145 MCG CAPS capsule 1 PO 30 mins prior to your first meal (Patient not taking: Reported on 12/14/2015) 30 capsule 11  . predniSONE (DELTASONE) 10 MG tablet 43 days, 33 days, 23 days, 13 days and stop (Patient not taking: Reported on 11/06/2015) 30 tablet 0  . psyllium (METAMUCIL) 58.6 % packet Take 1 packet by mouth daily.     No current facility-administered medications for this visit.    Functional Status:   In your present state of health, do you have any difficulty performing the following activities: 12/14/2015 11/06/2015  Hearing?  N -  Vision? N -  Difficulty concentrating or making decisions? N -  Walking or climbing stairs? Y -  Dressing or bathing? N -  Doing errands, shopping? N -  Preparing Food and eating ? - Y  Using the Toilet? - N  In the past six months, have you accidently leaked urine? - N  Do you have problems with loss of bowel control? - Y  Managing your Medications? - N  Managing your Finances? - N  Housekeeping or managing your Housekeeping? - Y    Fall/Depression Screening:    PHQ 2/9 Scores 11/06/2015  PHQ - 2 Score 0    Assessment:  HF-  Weights stable, +1-2 edema in  left leg/+1 left ankle.  Congestion noted on expiration in all lung fields.   Coughing during visit.    Need to f/u with MD to assess lungs.                            Plan:   Spouse to call Dr. Luan Pulling, request appointment for pt to be seen soon- assess cough.             HF- pt to continue to weigh daily/record, ongoing compliance with low Na+ diet.             RN CM to f/u again telephonically 12/21, check on pt's status (cough, congestion) plus                     Next home visit 01/15/16.    THN CM Care Plan Problem One        Most Recent Value   Care Plan Problem One  HF - new diagnosis, increase knowledge    Role Documenting the Problem One  Care Management Coordinator   Care Plan for Problem One  Active   THN Long Term Goal (31-90 days)  Pt would continue to be compliant with low Na+ diet in the next 31 days    THN Long Term Goal Start Date  12/14/15   Interventions for Problem One Long Term Goal  Discussed with pt and spouse preferred foods lower in salt (ex. white cheese, parmesan cheese).    THN CM Short Term Goal #1 (0-30 days)  Congestion/cough - pt would f/u with MD within next 1-2 days    THN CM Short Term Goal #1 Start Date  12/14/15   Interventions for Short Term Goal #1  Discussed with pt/spouse need to f/ with MD, assess congestion.  Spouse to call MD today and let RN CM know visit appointment     THN CM Short Term Goal #2 (0-30 days)  Cough/congestion would be resolved in the next 7 days    THN CM Short Term Goal #2 Start Date  12/14/15   Interventions for Short Term Goal #2  Discussed with spouse use of chest PT on pt, spirometer to help loosen secretions.           Zara Chess.   [facility]  [phone]

## 2015-12-14 NOTE — ED Provider Notes (Signed)
CSN: [id number]     Arrival date & time 12/14/15  1736 History   First MD Initiated Contact with Patient 12/14/15 1749     Chief Complaint  Patient presents with  . Wheezing     (Consider location/radiation/quality/duration/timing/severity/associated sxs/prior Treatment) Patient is a 69 y.o. male presenting with wheezing. The history is provided by the patient (the pt complains of cough and wheezing).  Wheezing Severity:  Moderate Onset quality:  Gradual Timing:  Intermittent Progression:  Waxing and waning Chronicity:  New Context: not animal exposure   Associated symptoms: no chest pain, no cough, no fatigue, no headaches and no rash     Past Medical History  Diagnosis Date  . Varicose veins   . New onset atrial fibrillation ([place]) 01/02/2015  . Morbid obesity ([place]) 01/02/2015  . Hypertension   . Sleep apnea     been tested but has not received the CPAP yet  . GERD (gastroesophageal reflux disease)   . History of gout   . Cancer ([place]) 02/2015    colon  . Family history of colon cancer   . Family history of breast cancer in mother    Past Surgical History  Procedure Laterality Date  . Vein ligation and stripping      left leg  . Colonoscopy N/A 03/28/2015    SLF: 1. Abdominal pain, diarrhea, rectal bleeding due to obstructing colon mass  . Esophagogastroduodenoscopy N/A 03/28/2015    SLF: 1. dysphagia 2. Mild non-erosive gastritis.   . Esophageal dilation N/A 03/28/2015    Procedure: ESOPHAGEAL DILATION;  Surgeon: [name], MD;  Location: [facility];  Service: Endoscopy;  Laterality: N/A;  . Partial colectomy N/A 04/03/2015    Procedure: PARTIAL COLECTOMY;  Surgeon: [name], MD;  Location: [facility] ORS;  Service: General;  Laterality: N/A;  . Portacath placement N/A 05/10/2015    Procedure: INSERTION PORT-A-CATH;  Surgeon: [name], MD;  Location: [facility] ORS;  Service: General;  Laterality: N/A;  left subclavian  . Skin cancer destruction Left 08/16/15    left  side of nose and left back  . Colonoscopy N/A 10/03/2015    SLF: normal anastomosis, fair prep (polyps less than 1cm could be missed), anal fissures and verrucous anal canal lesion with benign biopsy. Next TCS in 09/2016.  . Colonoscopy N/A 10/02/2015    SLF: normal anastomosis, poor bowel prep, three anal fissures   Family History  Problem Relation Age of Onset  . Breast cancer Mother     dx under 86  . Colon cancer Mother     dx in her mid 41s  . Heart disease Father   . Hyperlipidemia Father   . Liver cancer Father     heavy ETOH user when young  . Cancer Sister     slow "blood" cancer  . Diabetes Sister   . Heart disease Sister   . Diabetes Brother   . Cancer Maternal Aunt     2-3 maternal aunts with Cancer NOS  . Lung cancer Paternal Uncle     three uncles with lung cancer - all smokers  . Cancer Paternal Grandmother     possible bone cancer   Social History  Substance Use Topics  . Smoking status: Never Smoker   . Smokeless tobacco: Never Used  . Alcohol Use: No    Review of Systems  Constitutional: Negative for appetite change and fatigue.  HENT: Negative for congestion, ear discharge and sinus pressure.   Eyes: Negative  for discharge.  Respiratory: Positive for wheezing. Negative for cough.   Cardiovascular: Negative for chest pain.  Gastrointestinal: Negative for abdominal pain and diarrhea.  Genitourinary: Negative for frequency and hematuria.  Musculoskeletal: Negative for back pain.  Skin: Negative for rash.  Neurological: Negative for seizures and headaches.  Psychiatric/Behavioral: Negative for hallucinations.      Allergies  Review of patient's allergies indicates no known allergies.  Home Medications   Prior to Admission medications   Medication Sig Start Date End Date Taking? Authorizing Provider  dicyclomine (BENTYL) 10 MG capsule 1 po 30 MINS BEFORE BREAKFAST AND MAY REPEAT BEFORE LUNCH. Patient taking differently: Take 10 mg by mouth 2  (two) times daily. 1 po 30 MINS BEFORE BREAKFAST AND MAY REPEAT BEFORE LUNCH. 10/30/15  Yes [name], MD  ELIQUIS 5 MG TABS tablet TAKE 1 TABLET BY MOUTH TWICE A DAY 08/16/15  Yes Historical Provider, MD  escitalopram (LEXAPRO) 20 MG tablet Take 1 tablet (20 mg total) by mouth daily. 07/17/15  Yes [name], PA-C  furosemide (LASIX) 40 MG tablet Take 40 mg by mouth daily.  11/30/15  Yes Historical Provider, MD  levETIRAcetam (KEPPRA) 500 MG tablet Take 1 tablet (500 mg total) by mouth 2 (two) times daily. 10/24/15  Yes [name], MD  lidocaine-prilocaine (EMLA) cream Apply a quarter size amount to port site 1 hour prior to chemo. Do not rub in. Cover with plastic wrap. 04/28/15  Yes [name], MD  lisinopril (PRINIVIL,ZESTRIL) 10 MG tablet Take 1 tablet (10 mg total) by mouth daily. 01/04/15  Yes [name], MD  nitroGLYCERIN (NITROGLYN) 2 % ointment Apply 0.5 inches topically as needed for chest pain. For rectal area   Yes Historical Provider, MD  omeprazole (PRILOSEC) 20 MG capsule Take 1 capsule (20 mg total) by mouth daily. 03/15/15  Yes [name], PA-C  potassium chloride SA (K-DUR,KLOR-CON) 20 MEQ tablet Take 1 tablet (20 mEq total) by mouth daily. 10/13/15  Yes [name], MD  psyllium (METAMUCIL) 58.6 % packet Take 1 packet by mouth daily.   Yes Historical Provider, MD  azithromycin (ZITHROMAX) 250 MG tablet Take 250-500 mg by mouth See admin instructions. Take two tablets on day 1, then take one tablet on days 2 through 5 12/14/15   Historical Provider, MD  Linaclotide Rolan Lipa) 145 MCG CAPS capsule 1 PO 30 mins prior to your first meal Patient not taking: Reported on 12/14/2015 10/03/15   [name], MD  predniSONE (DELTASONE) 10 MG tablet 43 days, 33 days, 23 days, 13 days and stop Patient not taking: Reported on 11/06/2015 10/24/15   [name], MD   BP 101/73 mmHg  Pulse 66  Temp(Src) 98.1 F (36.7 C) (Oral)  Resp 15  Ht 5' 7.5" (1.715  m)  Wt 244 lb (110.678 kg)  BMI 37.63 kg/m2  SpO2 98% Physical Exam  Constitutional: He is oriented to person, place, and time. He appears well-developed.  HENT:  Head: Normocephalic.  Eyes: Conjunctivae and EOM are normal. No scleral icterus.  Neck: Neck supple. No thyromegaly present.  Cardiovascular: Normal rate and regular rhythm.  Exam reveals no gallop and no friction rub.   No murmur heard. Pulmonary/Chest: No stridor. He has wheezes. He has no rales. He exhibits no tenderness.  Abdominal: He exhibits no distension. There is no tenderness. There is no rebound.  Musculoskeletal: Normal range of motion. He exhibits no edema.  Lymphadenopathy:    He has no cervical adenopathy.  Neurological: He is oriented to person, place, and time. He exhibits normal muscle tone. Coordination normal.  Skin: No rash noted. No erythema.  Psychiatric: He has a normal mood and affect. His behavior is normal.    ED Course  Procedures (including critical care time) Labs Review Labs Reviewed  CBC WITH DIFFERENTIAL/PLATELET - Abnormal; Notable for the following:    RBC 3.58 (*)    Hemoglobin 12.2 (*)    HCT 35.0 (*)    MCH 34.1 (*)    Platelets 116 (*)    All other components within normal limits  COMPREHENSIVE METABOLIC PANEL - Abnormal; Notable for the following:    Glucose, Bld 112 (*)    Calcium 8.7 (*)    Total Protein 6.4 (*)    Albumin 2.6 (*)    Alkaline Phosphatase 127 (*)    Anion gap 4 (*)    All other components within normal limits  BRAIN NATRIURETIC PEPTIDE - Abnormal; Notable for the following:    B Natriuretic Peptide 239.0 (*)    All other components within normal limits  I-STAT TROPOININ, ED  I-STAT CG4 LACTIC ACID, ED  I-STAT CG4 LACTIC ACID, ED    Imaging Review Dg Chest 2 View  12/14/2015  CLINICAL DATA:  Shortness of breath, nonproductive cough. EXAM: CHEST  2 VIEW COMPARISON:  October 14, 2005. FINDINGS: The heart size and mediastinal contours are within normal  limits. Both lungs are clear. No pneumothorax or pleural effusion is noted. Left subclavian Port-A-Cath is noted with distal tip in expected position of the SVC. The visualized skeletal structures are unremarkable. IMPRESSION: No active cardiopulmonary disease. Electronically Signed   By: Marijo Conception, M.D.   On: 12/14/2015 18:38   I have personally reviewed and evaluated these images and lab results as part of my medical decision-making.   EKG Interpretation None      MDM   Final diagnoses:  Bronchitis  Bronchospasm    Bronchitis and bronchospam.  rx zpak and albuterol with pcp follow up     Milton Ferguson, MD 12/14/15 2048

## 2015-12-14 NOTE — ED Notes (Signed)
Pt home health nurse came today to evaluate pt.  Pt has expiratory wheezes in all fields, bp 90/62.  Pt "sometimes" sob.  Pt alert and oriented.

## 2015-12-15 ENCOUNTER — Other Ambulatory Visit: Payer: Self-pay | Admitting: *Deleted

## 2015-12-15 NOTE — Patient Outreach (Signed)
Received a call from pt's spouse Dalene Seltzer, provided report on recent ED visit (12/15).  Spouse reports was told pt had congestion- gave pt a breathing treatment, steroid shot, sent home with inhaler and zpak.   Spouse states pt came into ED yesterday  wheezing, breathing better today, no wheezing.  RN CM discussed with spouse view in [place] shows pt diagnosed with bronchitis.    Also, discussed f/u with Dr. Luan Pulling per ED discharge orders to which spouse states if do not hear back from MD office today (calls made), will take pt to MD office 12/19- walk in.  As requested, spouse to call RN CM 12/19- provide update on pt's status, MD office visit.     Zara Chess.   [facility]  [phone]

## 2015-12-20 ENCOUNTER — Ambulatory Visit: Payer: Self-pay | Admitting: *Deleted

## 2015-12-20 ENCOUNTER — Other Ambulatory Visit: Payer: Self-pay | Admitting: *Deleted

## 2015-12-20 NOTE — Patient Outreach (Addendum)
Attempt made to f/u with pt, check on status (respiratory status- congestion).  HIPPA compliant voice message left with contact number.    Plan to f/u with pt 01/15/16- home visit .     Zara Chess.   [facility]  [phone]

## 2015-12-26 ENCOUNTER — Telehealth: Payer: Self-pay | Admitting: Cardiology

## 2015-12-26 ENCOUNTER — Other Ambulatory Visit: Payer: Self-pay

## 2015-12-26 DIAGNOSIS — N289 Disorder of kidney and ureter, unspecified: Secondary | ICD-10-CM

## 2015-12-26 DIAGNOSIS — Z79899 Other long term (current) drug therapy: Secondary | ICD-10-CM

## 2015-12-26 DIAGNOSIS — I509 Heart failure, unspecified: Secondary | ICD-10-CM

## 2015-12-26 NOTE — Telephone Encounter (Signed)
Called pt and his wife states that he has gained 6 lbs since yesterday. He has not ate any different that usual. He has swelling in the left lower leg/ankle area. She states that he has a bad cold, but has not been taking any different medications than prescribed. He is a little SOB, but it could be coming from the cold.

## 2015-12-26 NOTE — Telephone Encounter (Signed)
Put lab orders in & talked to pt's wife. She will bring him to get lab work done in the morning.

## 2015-12-26 NOTE — Telephone Encounter (Signed)
Pt was seen by Zandra Abts in October He should come in for BMET and BNP

## 2015-12-26 NOTE — Telephone Encounter (Signed)
Patient's wife states that patient has had 6 lb weight gain over night. / tg

## 2015-12-27 ENCOUNTER — Other Ambulatory Visit: Payer: Self-pay | Admitting: *Deleted

## 2015-12-27 ENCOUNTER — Other Ambulatory Visit (HOSPITAL_COMMUNITY)
Admission: RE | Admit: 2015-12-27 | Discharge: 2015-12-27 | Disposition: A | Discharge status: Home / Self Care | Payer: Medicare Other | Source: Ambulatory Visit | Attending: Cardiology | Admitting: Cardiology

## 2015-12-27 DIAGNOSIS — Z79899 Other long term (current) drug therapy: Secondary | ICD-10-CM

## 2015-12-27 DIAGNOSIS — Z5181 Encounter for therapeutic drug level monitoring: Secondary | ICD-10-CM | POA: Diagnosis present

## 2015-12-27 DIAGNOSIS — I509 Heart failure, unspecified: Secondary | ICD-10-CM | POA: Diagnosis not present

## 2015-12-27 LAB — BASIC METABOLIC PANEL
ANION GAP: 5 (ref 5–15)
BUN: 14 mg/dL (ref 6–20)
CHLORIDE: 103 mmol/L (ref 101–111)
CO2: 30 mmol/L (ref 22–32)
Calcium: 8.9 mg/dL (ref 8.9–10.3)
Creatinine, Ser: 0.85 mg/dL (ref 0.61–1.24)
GFR calc Af Amer: >60 mL/min (ref >60)
GFR calc non Af Amer: >60 mL/min (ref >60)
GLUCOSE: 98 mg/dL (ref 65–99)
POTASSIUM: 3.7 mmol/L (ref 3.5–5.1)
Sodium: 138 mmol/L (ref 135–145)

## 2015-12-27 LAB — BRAIN NATRIURETIC PEPTIDE: B Natriuretic Peptide: 254 pg/mL — ABNORMAL HIGH (ref 0.0–100.0)

## 2015-12-27 NOTE — Telephone Encounter (Signed)
LET PT KNOW HE CAN TAKE EXTRA DOSE OF LASIX WHILE WEIGHT IS UP. VOICED UNDERSTANDING

## 2015-12-27 NOTE — Telephone Encounter (Signed)
PT made aware of results of blood work. States that he is taking 40 mg lasix each morning. His weight had gotten up to 251 lbs by bedtime last night, but had went back down this morning to 249 lbs. The did not go anywhere nor eat any differently over the holidays because they were scared he would retain fluid again.

## 2015-12-27 NOTE — Telephone Encounter (Signed)
Please let him know that if his weight increases its ok to take additional 40mg  of lasix in the evening until his weight comes down. If he is back to his baseline weight then I would just continue his lasix 40mg  once daily    Zandra Abts MD

## 2015-12-27 NOTE — Telephone Encounter (Signed)
-----   Message from [name], MD sent at 12/27/2015 12:29 PM EST ----- Labs look ok. Please clarify what his weight is today and how much gained over the last few days. Is he taking his lasix, please clarify the dose. Has he increased his salt intake, perhaps at Christmas?  Zandra Abts MD

## 2015-12-29 ENCOUNTER — Telehealth: Payer: Self-pay | Admitting: Cardiology

## 2015-12-29 NOTE — Telephone Encounter (Signed)
Pt's weight this morning is 245

## 2015-12-29 NOTE — Telephone Encounter (Signed)
Called pt, spoke to wife & let her know that since he is down to his normal weight to just go to normal dosage. But if weight goes up again take an extra dose until weight comes back down. She  voiced uinderstanding

## 2016-01-02 ENCOUNTER — Other Ambulatory Visit (HOSPITAL_COMMUNITY): Payer: Self-pay

## 2016-01-02 DIAGNOSIS — C189 Malignant neoplasm of colon, unspecified: Secondary | ICD-10-CM

## 2016-01-02 MED ORDER — ESCITALOPRAM OXALATE 20 MG PO TABS: 20.0000 mg | ORAL_TABLET | Freq: Every day | ORAL | Status: AC

## 2016-01-02 NOTE — Progress Notes (Signed)
Refill for Lexapro sent to [facility] pharmacy via e-scribe.  Paper given to [facility] to be scanned.

## 2016-01-10 ENCOUNTER — Ambulatory Visit (HOSPITAL_COMMUNITY): Payer: Medicare Other | Attending: Pulmonary Disease | Admitting: Physical Therapy

## 2016-01-10 DIAGNOSIS — R269 Unspecified abnormalities of gait and mobility: Secondary | ICD-10-CM | POA: Diagnosis not present

## 2016-01-10 DIAGNOSIS — R262 Difficulty in walking, not elsewhere classified: Secondary | ICD-10-CM

## 2016-01-10 DIAGNOSIS — R296 Repeated falls: Secondary | ICD-10-CM | POA: Diagnosis present

## 2016-01-10 DIAGNOSIS — R6889 Other general symptoms and signs: Secondary | ICD-10-CM | POA: Insufficient documentation

## 2016-01-10 DIAGNOSIS — R29898 Other symptoms and signs involving the musculoskeletal system: Secondary | ICD-10-CM | POA: Insufficient documentation

## 2016-01-10 NOTE — Therapy (Signed)
[name], [place], [address] Phone: [phone]   Fax:  [phone]  Physical Therapy Evaluation  Patient Details  Name: [name] MRN: [id number] Date of Birth: 1946-11-29 Referring Provider: Luan Pulling   Encounter Date: 01/10/2016      PT End of Session - 01/10/16 1012    Visit Number 1   Number of Visits 18   Date for PT Re-Evaluation 07/26/15   Authorization Type Medicare   Authorization - Visit Number 1   Authorization - Number of Visits 18   PT Start Time [phone]   PT Stop Time 1015   PT Time Calculation (min) 38 min      Past Medical History  Diagnosis Date  . Varicose veins   . New onset atrial fibrillation ([place]) 01/02/2015  . Morbid obesity ([place]) 01/02/2015  . Hypertension   . Sleep apnea     been tested but has not received the CPAP yet  . GERD (gastroesophageal reflux disease)   . History of gout   . Cancer ([place]) 02/2015    colon  . Family history of colon cancer   . Family history of breast cancer in mother     Past Surgical History  Procedure Laterality Date  . Vein ligation and stripping      left leg  . Colonoscopy N/A 03/28/2015    SLF: 1. Abdominal pain, diarrhea, rectal bleeding due to obstructing colon mass  . Esophagogastroduodenoscopy N/A 03/28/2015    SLF: 1. dysphagia 2. Mild non-erosive gastritis.   . Esophageal dilation N/A 03/28/2015    Procedure: ESOPHAGEAL DILATION;  Surgeon: Peter Binder, Beas[place];  [facility];  Service: Endoscopy;  Laterality: N/A;  . Partial colectomy N/A 04/03/2015    Procedure: PARTIAL COLECTOMY;  Surgeon: Peter Signs Md, Beas[place];  Location: [facility];  Service: General;  Laterality: N/A;  . Portacath placement N/A 05/10/2015    Procedure: INSERTION PORT-A-CATH;  Surgeon: Peter Signs Md, Beasley;  Location: [facility];  Service: General;  Laterality: N/A;  left subclavian  . Skin cancer destruction Left 08/16/15    left side of nose and left back  .  Colonoscopy N/A 10/03/2015    SLF: normal anastomosis, fair prep (polyps less than 1cm could be missed), anal fissures and verrucous anal canal lesion with benign biopsy. Next TCS in 09/2016.  . Colonoscopy N/A 10/02/2015    SLF: normal anastomosis, poor bowel prep, three anal fissures    There were no vitals filed for this visit.  Visit Diagnosis:  Abnormality of gait  Decreased functional activity tolerance  Difficulty walking  Leg weakness, bilateral  Falls frequently      Subjective Assessment - 01/10/16 0935    Subjective Mr. [name] states that he has been on a walker since he was discharged from the hospital in October.   He was admitted for CHF on 10/15/2015 thru 10/24/2015.  He would like to get off the walker. Rt Knee is painful  and has been for a long period of time.    Patient is accompained by: Family member   Pertinent History Patient had cancer surgery in april 2016 to removed a18inches of colon, Beasley beleives they removed all of colon, Patient began Chemo 05/17/15. patient had surgery 20 years ago to "strip veins" and has sicne had swelling in both legs.Pt has hx of CHF patint enjos shooting pool, patient states limited physical activity due to bad knee and limited energy levels noting "  I can t walk across the road."  "I feel worn out to where i can sit up for a while then have to sit back down and rest.  little pain with sit to stance.    How long can you sit comfortably? no limitations   How long can you stand comfortably? less than five minutes    How long can you walk comfortably? Pt is able to walk with his walker less than five minutes    Patient Stated Goals to have have more energy, to be able  to walk further; to get rid of the walkder    Currently in Pain? Yes   Pain Score 8    Pain Location Knee   Pain Orientation Right   Pain Descriptors / Indicators Aching   Pain Type Chronic pain   Pain Onset More than a month ago  70 years ago    Aggravating Factors   activity             [facility] PT Assessment - 01/10/16 0001    Assessment   Medical Diagnosis balance disorder   Referring Provider Luan Pulling    Onset Date/Surgical Date 10/19/15   Hand Dominance Right   Next Beas[place] Visit 01/18/2016   Prior Therapy yes for cancer   Precautions   Precautions Fall   Restrictions   Weight Bearing Restrictions No   Balance Screen   Has the patient fallen in the past 6 months Yes   How many times? at least 20 times    Has the patient had a decrease in activity level because of a fear of falling?  Yes   Is the patient reluctant to leave their home because of a fear of falling?  Yes   [place] residence   Observation/Other Assessments   Focus on Therapeutic Outcomes (FOTO)  45   Functional Tests   Functional tests Single leg stance;Sit to Stand   Single Leg Stance   Comments unable on either leg.    Sit to Stand   Comments 5 x in 19.58   Posture/Postural Control   Posture/Postural Control Postural limitations   Postural Limitations Rounded Shoulders;Forward head;Increased thoracic kyphosis;Flexed trunk   Strength   Right/Left Hip Right;Left   Right Hip Flexion 4-/5   Right Hip Extension 3-/5   Right Hip ABduction 3/5   Left Hip Flexion 4+/5   Left Hip Extension 3+/5   Left Hip ABduction 4-/5   Right Knee Flexion 4/5   Right Knee Extension 4/5   Left Knee Flexion 4/5   Left Knee Extension 5/5   Right Ankle Dorsiflexion 5/5   Left Ankle Dorsiflexion 5/5                   [facility] Adult PT Treatment/Exercise - 01/10/16 0001    Exercises   Exercises Knee/Hip   Lumbar Exercises: Seated   Long Arc Quad on Chair Strengthening;Right;10 reps   Lumbar Exercises: Supine   Bridge 10 reps   Straight Leg Raise 5 reps   Lumbar Exercises: Sidelying   Hip Abduction 5 reps                PT Education - 01/10/16 1011    Education provided Yes   Education Details HEP   Person(s) Educated Patient    Methods Explanation   Comprehension Verbalized understanding          PT Short Term Goals - 01/10/16 1152    PT  SHORT TERM GOAL #1   Title Pt to be I in Hep in order to obtain goals in a timely manner.    Time 3   Period Weeks   Status New   PT SHORT TERM GOAL #2   Title Patient will be able to ambulate 97minutes without resting in order to progress to better health habits.    Time 3   Period Weeks   Status New   PT SHORT TERM GOAL #3   Title Pt to be able to stand for 10 minutes to be able to complete self grooming activity without fear of falling    Time 3   Period Weeks           PT Long Term Goals - 2016/01/12 1154    PT LONG TERM GOAL #1   Title Pt to be I in advance HEP in order to decrease frequency of falling    Time 6   Period Weeks   Status New   PT LONG TERM GOAL #2   Title Pt to have not fallen in the past two weeks for reduced risk of injury    Time 6   Period Weeks   Status New   PT LONG TERM GOAL #3   Title Pt to be able to walk for 15 minutes without stopping for better health habits   Time 6   Period Weeks   Status New   PT LONG TERM GOAL #4   Title Pt to be able to stand for 15 minutes to be safe in shower    Time 6   Period Weeks   PT LONG TERM GOAL #5   Title Pt strength to be increased by one grade to be able to walk in his home with a cane    Time 6   Period Weeks   Status New               Plan - 2016/01/12 1016    Clinical Impression Statement Pt is a 70 yo male who has had hx of recent cancer as well as CHF and multiple other medical issues.  He was hospitalized in October for nine days and has been on a walker ever since.  Prior to hospitalization he was ambulating with a cane.  He has been falling frequently to a point where he and his wife have moved in with their son due to the fact that his wife can not pick him up off the floor.   He has been referred to skilled PT to try and improve his balance and strength to a point where  he is not falling .  Examination demonstrates decreased balance, decreased strength, decreased activity tolerance, and postrual limitations.  Mr. Arletta Bale will benefit from skilled PT to address these issues and maximize his functional capacity   Pt will benefit from skilled therapeutic intervention in order to improve on the following deficits Abnormal gait;Decreased activity tolerance;Decreased balance;Decreased endurance;Decreased strength;Difficulty walking;Pain;Postural dysfunction   Rehab Potential Good   PT Frequency 3x / week   PT Duration 6 weeks   PT Treatment/Interventions Gait training;Functional mobility training;Therapeutic activities;Therapeutic exercise;Balance training;Patient/family education   PT Next Visit Plan Begin rockerboard, heel raises, functional squats, SLS next treatment.    PT Home Exercise Plan given           G-Codes - 01/12/16 1158    Functional Assessment Tool Used foto   Functional Limitation Mobility: Walking and moving around   Mobility: Walking and  Moving Around Current Status [phone]) At least 40 percent but less than 60 percent impaired, limited or restricted   Mobility: Walking and Moving Around Goal Status [phone]) At least 40 percent but less than 60 percent impaired, limited or restricted       Problem List Patient Active Problem List   Diagnosis Date Noted  . 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 Altered mental status   . Abnormal LFTs   . Fecal incontinence   . Peripheral edema 10/15/2015  . Acute diastolic CHF (congestive heart failure) ([place]) 10/15/2015  . Generalized weakness 10/15/2015  . DVT (deep venous thrombosis) ([place]) 10/15/2015  . Atrial fibrillation ([place]) 10/15/2015  . Change in bowel habits   . Malignant neoplasm of colon ([place])   . Colon cancer ([place])   . Constipation 09/20/2015  . Elevated liver enzymes 09/20/2015  . Cellulitis 08/04/2015  . Family history of colon cancer   . Family history of breast  cancer in mother   . Colon carcinoma ([place]) 04/03/2015  . Occult blood in stools   . Dysphagia, pharyngoesophageal phase   . Melena 03/15/2015  . Heme positive stool 03/15/2015  . Esophageal dysphagia 03/15/2015  . Diarrhea 03/15/2015  . New onset atrial fibrillation ([place]) 01/02/2015  . Morbid obesity ([place]) 01/02/2015  . Varicose veins of lower extremities with other complications [id number]    Rayetta Humphrey, PT CLT [phone] 01/10/2016, 12:00 PM  [place], [place], [address] Phone: [phone]   Fax:  [phone]  Name: SILER MONTEMAYOR MRN: [id number] Date of Birth: 1946-08-10

## 2016-01-10 NOTE — Patient Instructions (Signed)
Functional Quadriceps: Sit to Stand    Sit on edge of chair, feet flat on floor. Stand upright, extending knees fully. Repeat _5___ times per set. Do __1__ sets per session. Do ____ sessions per day. 3 http://orth.exer.us/734   Copyright  VHI. All rights reserved.  Strengthening: Straight Leg Raise (Phase 1)    Tighten muscles on front of right thigh, then lift leg _15___ inches from surface, keeping knee locked.  Repeat _10___ times per set. Do __1__ sets per session. Do ____ sessions per day. 2 http://orth.exer.us/614   Copyright  VHI. All rights reserved.  Strengthening: Hip Abduction (Side-Lying)    Tighten muscles on front of right thigh, then lift leg _15___ inches from surface, keeping knee locked.  Repeat __10__ times per set. Do _1___ sets per session. Do __2__ sessions per day.  http://orth.exer.us/622   Copyright  VHI. All rights reserved.  Bridging    Slowly raise buttocks from floor, keeping stomach tight. Repeat ___10-15_ times per set. Do __1__ sets per session. Do __2__ sessions per day.  http://orth.exer.us/1096   Copyright  VHI. All rights reserved.  Knee Extension (Sitting)    Place ___4_ pound weight on right ankle and straighten knee fully, lower slowly. Repeat __15__ times per set. Do _1__ sets per session. Do __2__ sessions per day.  http://orth.exer.us/732   Copyright  VHI. All rights reserved.

## 2016-01-11 ENCOUNTER — Ambulatory Visit (HOSPITAL_COMMUNITY): Payer: Medicare Other

## 2016-01-11 DIAGNOSIS — R6889 Other general symptoms and signs: Secondary | ICD-10-CM

## 2016-01-11 DIAGNOSIS — R269 Unspecified abnormalities of gait and mobility: Secondary | ICD-10-CM | POA: Diagnosis not present

## 2016-01-11 DIAGNOSIS — R296 Repeated falls: Secondary | ICD-10-CM

## 2016-01-11 DIAGNOSIS — R262 Difficulty in walking, not elsewhere classified: Secondary | ICD-10-CM

## 2016-01-11 DIAGNOSIS — R29898 Other symptoms and signs involving the musculoskeletal system: Secondary | ICD-10-CM

## 2016-01-11 NOTE — Therapy (Signed)
[facility] [address] [place], [place], [address] Phone: [phone]   Fax:  [phone]  Physical Therapy Treatment  Patient Details  Name: [name] MRN: [id number] Date of Birth: [birth year] Referring Provider: Luan Pulling   Encounter Date: 01/11/2016      PT End of Session - 01/11/16 1728    Visit Number 2   Number of Visits 18   Authorization Type Medicare   Authorization - Visit Number 2   Authorization - Number of Visits 10   PT Start Time [id number]   PT Stop Time 1800   PT Time Calculation (min) 39 min   Equipment Utilized During Treatment Gait belt   Activity Tolerance Patient tolerated treatment well;Patient limited by fatigue;Patient limited by pain  Rt knee pain   Behavior During Therapy [facility] for tasks assessed/performed      Past Medical History  Diagnosis Date  . Varicose veins   . New onset atrial fibrillation ([place]) 01/02/2015  . Morbid obesity ([place]) 01/02/2015  . Hypertension   . Sleep apnea     been tested but has not received the CPAP yet  . GERD (gastroesophageal reflux disease)   . History of gout   . Cancer ([place]) 02/2015    colon  . Family history of colon cancer   . Family history of breast cancer in mother     Past Surgical History  Procedure Laterality Date  . Vein ligation and stripping      left leg  . Colonoscopy N/A 03/28/2015    SLF: 1. Abdominal pain, diarrhea, rectal bleeding due to obstructing colon mass  . Esophagogastroduodenoscopy N/A 03/28/2015    SLF: 1. dysphagia 2. Mild non-erosive gastritis.   . Esophageal dilation N/A 03/28/2015    Procedure: ESOPHAGEAL DILATION;  Surgeon: [name], MD;  [facility];  Service: Endoscopy;  Laterality: N/A;  . Partial colectomy N/A 04/03/2015    Procedure: PARTIAL COLECTOMY;  Surgeon: [name], MD;  Location: [facility];  Service: General;  Laterality: N/A;  . Portacath placement N/A 05/10/2015    Procedure: INSERTION PORT-A-CATH;   Surgeon: [name], MD;  Location: [facility] OR[facility];  Service: General;  Laterality: N/A;  left subclavian  . Skin cancer destruction Left 08/16/15    left side of nose and left back  . Colonoscopy N/A 10/03/2015    SLF: normal anastomosis, fair prep (polyps less than 1cm could be missed), anal fissures and verrucous anal canal lesion with benign biopsy. Next TCS in 09/2016.  . Colonoscopy N/A 10/02/2015    SLF: normal anastomosis, poor bowel prep, three anal fissures    There were no vitals filed for this visit.  Visit Diagnosis:  Abnormality of gait  Decreased functional activity tolerance  Difficulty walking  Leg weakness, bilateral  Falls frequently      Subjective Assessment - 01/11/16 1725    Subjective Pt reports compliance with HEP, no questions concening exercises.  Rt knee pain scale 7/10.   Pertinent History Patient had cancer surgery in april 2016 to removed a18inches of colon, mD beleives they removed all of colon, Patient began Chemo 05/17/15. patient had surgery 20 years ago to "strip veins" and has sicne had swelling in both legs. patint enjos shooting pool, patient states limited physical activity due to bad knee and limited energy levels noting "I can t walk across the road."  "I feel worn out to where i can sit up for a while then have  to sit back down and rest.  little pain with sit to stance.    Patient Stated Goals to have have more energy, to be able  to walk further; to get rid of the walkder    Currently in Pain? Yes   Pain Score 7    Pain Location Knee   Pain Descriptors / Indicators Aching;Sore           [facility] Adult PT Treatment/Exercise - 01/11/16 0001    Knee/Hip Exercises: Standing   Heel Raises 10 reps   Heel Raises Limitations heel and toe raises   Functional Squat 10 reps   Rocker Board 2 minutes   Rocker Board Limitations R/L and A/P with HHA   SLS Rt unable due to fatigue, Lt 3" max of 4   Knee/Hip Exercises: Seated   Long Arc Quad 10 reps    Sit to [employer] 20 reps;without UE support  1 foot behind each 10x no HHA   Knee/Hip Exercises: Supine   Bridges Limitations 10   Straight Leg Raises 10 reps   Knee/Hip Exercises: Sidelying   Hip ABduction 10 reps           PT Education - 01/10/16 1011    Education provided Yes   Education Details HEP   Person(s) Educated Patient   Methods Explanation   Comprehension Verbalized understanding          PT Short Term Goals - 01/10/16 1152    PT SHORT TERM GOAL #1   Title Pt to be I in Hep in order to obtain goals in a timely manner.    Time 3   Period Weeks   Status New   PT SHORT TERM GOAL #2   Title Patient will be able to ambulate 8minutes without resting in order to progress to better health habits.    Time 3   Period Weeks   Status New   PT SHORT TERM GOAL #3   Title Pt to be able to stand for 10 minutes to be able to complete self grooming activity without fear of falling    Time 3   Period Weeks           PT Long Term Goals - 01/10/16 1154    PT LONG TERM GOAL #1   Title Pt to be I in advance HEP in order to decrease frequency of falling    Time 6   Period Weeks   Status New   PT LONG TERM GOAL #2   Title Pt to have not fallen in the past two weeks for reduced risk of injury    Time 6   Period Weeks   Status New   PT LONG TERM GOAL #3   Title Pt to be able to walk for 15 minutes without stopping for better health habits   Time 6   Period Weeks   Status New   PT LONG TERM GOAL #4   Title Pt to be able to stand for 15 minutes to be safe in shower    Time 6   Period Weeks   PT LONG TERM GOAL #5   Title Pt strength to be increased by one grade to be able to walk in his home with a cane    Time 6   Period Weeks   Status New               Plan - 01/11/16 1802    Clinical Impression Statement Reviewed goals, compliance  and technique with HEP and copy of eval given to pt.  Pt able to complete HEP exercises with proper form and technqiue.   Progressed to standing LE exercises for strengthening with appropriate technqiue following demonstration and cueing for posture and form.  Pt limited by fatigue requiring 3 rest breaks through session.  Pt reported increased knee pain by one grade at end of session, pt encouraged to apply ice for pain.   PT Next Visit Plan Continue with current PT POC with closed chain exercises.  Pt completed good form and technique with HEP, no mat activities needed during session.          G-Codes - 01/13/16 1158    Functional Assessment Tool Used foto   Functional Limitation Mobility: Walking and moving around   Mobility: Walking and Moving Around Current Status [phone]) At least 40 percent but less than 60 percent impaired, limited or restricted   Mobility: Walking and Moving Around Goal Status [phone]) At least 40 percent but less than 60 percent impaired, limited or restricted      Problem List Patient Active Problem List   Diagnosis Date Noted  . 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 Altered mental status   . Abnormal LFTs   . Fecal incontinence   . Peripheral edema 10/15/2015  . Acute diastolic CHF (congestive heart failure) ([place]) 10/15/2015  . Generalized weakness 10/15/2015  . DVT (deep venous thrombosis) ([place]) 10/15/2015  . Atrial fibrillation ([place]) 10/15/2015  . Change in bowel habits   . Malignant neoplasm of colon ([place])   . Colon cancer ([place])   . Constipation 09/20/2015  . Elevated liver enzymes 09/20/2015  . Cellulitis 08/04/2015  . Family history of colon cancer   . Family history of breast cancer in mother   . Colon carcinoma ([place]) 04/03/2015  . Occult blood in stools   . Dysphagia, pharyngoesophageal phase   . Melena 03/15/2015  . Heme positive stool 03/15/2015  . Esophageal dysphagia 03/15/2015  . Diarrhea 03/15/2015  . New onset atrial fibrillation ([place]) 01/02/2015  . Morbid obesity ([place]) 01/02/2015  . Varicose veins of lower extremities with other  complications [id number]   Ihor Austin, LPTA; CBIS [phone]  Aldona Lento 01/11/2016, 6:10 PM  [place], [place], [address] Phone: [phone]   Fax:  [phone]  Name: ISA CHONKO MRN: [id number] Date of Birth: Sep 19, 1946

## 2016-01-12 ENCOUNTER — Ambulatory Visit (HOSPITAL_COMMUNITY): Payer: Medicare Other

## 2016-01-12 DIAGNOSIS — R262 Difficulty in walking, not elsewhere classified: Secondary | ICD-10-CM

## 2016-01-12 DIAGNOSIS — R269 Unspecified abnormalities of gait and mobility: Secondary | ICD-10-CM | POA: Diagnosis not present

## 2016-01-12 DIAGNOSIS — R6889 Other general symptoms and signs: Secondary | ICD-10-CM

## 2016-01-12 DIAGNOSIS — R29898 Other symptoms and signs involving the musculoskeletal system: Secondary | ICD-10-CM

## 2016-01-12 DIAGNOSIS — R296 Repeated falls: Secondary | ICD-10-CM

## 2016-01-12 NOTE — Therapy (Signed)
[place], [place], [address] Phone: [phone]   Fax:  [phone]  Physical Therapy Treatment  Patient Details  Name: [name] MRN: [id number] Date of Birth: 11/03/1946 Referring Provider: Luan Pulling   Encounter Date: 01/12/2016      PT End of Session - 01/12/16 1650    Visit Number 3   Number of Visits 18   Date for PT Re-Evaluation 02/07/16   Authorization Type Medicare   Authorization - Visit Number 3   Authorization - Number of Visits 10   PT Start Time [id number]   PT Stop Time 1728   PT Time Calculation (min) 39 min   Equipment Utilized During Treatment Gait belt   Activity Tolerance Patient tolerated treatment well;Patient limited by fatigue;Patient limited by pain   Behavior During Therapy [facility] for tasks assessed/performed      Past Medical History  Diagnosis Date  . Varicose veins   . New onset atrial fibrillation ([place]) 01/02/2015  . Morbid obesity ([place]) 01/02/2015  . Hypertension   . Sleep apnea     been tested but has not received the CPAP yet  . GERD (gastroesophageal reflux disease)   . History of gout   . Cancer ([place]) 02/2015    colon  . Family history of colon cancer   . Family history of breast cancer in mother     Past Surgical History  Procedure Laterality Date  . Vein ligation and stripping      left leg  . Colonoscopy N/A 03/28/2015    SLF: 1. Abdominal pain, diarrhea, rectal bleeding due to obstructing colon mass  . Esophagogastroduodenoscopy N/A 03/28/2015    SLF: 1. dysphagia 2. Mild non-erosive gastritis.   . Esophageal dilation N/A 03/28/2015    Procedure: ESOPHAGEAL DILATION;  Surgeon: [name], MD;  Location: [facility] ENDO SUITE;  Service: Endoscopy;  Laterality: N/A;  . Partial colectomy N/A 04/03/2015    Procedure: PARTIAL COLECTOMY;  Surgeon: [name], MD;  Location: [facility] ORS;  Service: General;  Laterality: N/A;  . Portacath placement N/A 05/10/2015    Procedure:  INSERTION PORT-A-CATH;  Surgeon: [name], MD;  Location: [facility] ORS;  Service: General;  Laterality: N/A;  left subclavian  . Skin cancer destruction Left 08/16/15    left side of nose and left back  . Colonoscopy N/A 10/03/2015    SLF: normal anastomosis, fair prep (polyps less than 1cm could be missed), anal fissures and verrucous anal canal lesion with benign biopsy. Next TCS in 09/2016.  . Colonoscopy N/A 10/02/2015    SLF: normal anastomosis, poor bowel prep, three anal fissures    There were no vitals filed for this visit.  Visit Diagnosis:  Abnormality of gait  Decreased functional activity tolerance  Difficulty walking  Leg weakness, bilateral  Falls frequently      Subjective Assessment - 01/12/16 1648    Subjective Pt stated he was a little sore this morning, took some Ibuprofin and it helped.  No reports of pain today.    Pertinent History Patient had cancer surgery in april 2016 to removed a18inches of colon, mD beleives they removed all of colon, Patient began Chemo 05/17/15. patient had surgery 20 years ago to "strip veins" and has sicne had swelling in both legs. patint [place] shooting pool, patient states limited physical activity due to bad knee and limited energy levels noting "I can t walk across the road."  "I feel worn out to  where i can sit up for a while then have to sit back down and rest.  little pain with sit to stance.    Patient Stated Goals to have have more energy, to be able  to walk further; to get rid of the walkder    Currently in Pain? No/denies             [facility] Adult PT Treatment/Exercise - 01/12/16 0001    Posture/Postural Control   Posture/Postural Control Postural limitations   Postural Limitations Rounded Shoulders;Forward head;Increased thoracic kyphosis;Flexed trunk   Knee/Hip Exercises: Standing   Heel Raises 15 reps   Heel Raises Limitations heel and toe raises   Hip Abduction Both;10 reps   Functional Squat 10 reps   Rocker  Board 2 minutes   Rocker Board Limitations R/L and A/P with HHA   SLS 2x 30" with 1 HHA; Lt 6" Rt 3"   Other Standing Knee Exercises partial tandem stance 3x 30"   Knee/Hip Exercises: Seated   Sit to Sand without UE support;20 reps  each foot behind 10x              PT Short Term Goals - 01/10/16 1152    PT SHORT TERM GOAL #1   Title Pt to be I in Hep in order to obtain goals in a timely manner.    Time 3   Period Weeks   Status New   PT SHORT TERM GOAL #2   Title Patient will be able to ambulate 62minutes without resting in order to progress to better health habits.    Time 3   Period Weeks   Status New   PT SHORT TERM GOAL #3   Title Pt to be able to stand for 10 minutes to be able to complete self grooming activity without fear of falling    Time 3   Period Weeks           PT Long Term Goals - 01/10/16 1154    PT LONG TERM GOAL #1   Title Pt to be I in advance HEP in order to decrease frequency of falling    Time 6   Period Weeks   Status New   PT LONG TERM GOAL #2   Title Pt to have not fallen in the past two weeks for reduced risk of injury    Time 6   Period Weeks   Status New   PT LONG TERM GOAL #3   Title Pt to be able to walk for 15 minutes without stopping for better health habits   Time 6   Period Weeks   Status New   PT LONG TERM GOAL #4   Title Pt to be able to stand for 15 minutes to be safe in shower    Time 6   Period Weeks   PT LONG TERM GOAL #5   Title Pt strength to be increased by one grade to be able to walk in his home with a cane    Time 6   Period Weeks   Status New               Plan - 01/12/16 1725    Clinical Impression Statement Session focus on improving standing tolerance, strengthening and balance training.  Increased reps with standing strenghtening exercises for increased activity tolerance.  Began tandem stance, SLS with HHA and standing abduction to improve balance and gluteal strengthening.  Pt limited by  fatigue with increased demand requiring  frequent seated rest breaks.  Pt reports increased Rt knee pain with standing, encouraged to apply ice for pain control at home.   PT Next Visit Plan Continue with current PT POC for strengthening, balance and standing tolerance.        Problem List Patient Active Problem List   Diagnosis Date Noted  . 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 Altered mental status   . Abnormal LFTs   . Fecal incontinence   . Peripheral edema 10/15/2015  . Acute diastolic CHF (congestive heart failure) ([place]) 10/15/2015  . Generalized weakness 10/15/2015  . DVT (deep venous thrombosis) ([place]) 10/15/2015  . Atrial fibrillation ([place]) 10/15/2015  . Change in bowel habits   . Malignant neoplasm of colon ([place])   . Colon cancer ([place])   . Constipation 09/20/2015  . Elevated liver enzymes 09/20/2015  . Cellulitis 08/04/2015  . Family history of colon cancer   . Family history of breast cancer in mother   . Colon carcinoma ([place]) 04/03/2015  . Occult blood in stools   . Dysphagia, pharyngoesophageal phase   . Melena 03/15/2015  . Heme positive stool 03/15/2015  . Esophageal dysphagia 03/15/2015  . Diarrhea 03/15/2015  . New onset atrial fibrillation ([place]) 01/02/2015  . Morbid obesity ([place]) 01/02/2015  . Varicose veins of lower extremities with other complications [id number]   Ihor Austin, LPTA; [place]  Aldona Lento 01/12/2016, 5:48 PM  [place] [address] [place], [place], [address] Phone: [phone]   Fax:  [phone]  Name: [name] MRN: [id number] Date of Birth: 11-17-1946

## 2016-01-14 ENCOUNTER — Emergency Department (HOSPITAL_COMMUNITY)
Admission: EM | Admit: 2016-01-14 | Discharge: 2016-01-14 | Disposition: A | Discharge status: Home / Self Care | Payer: Medicare Other | Attending: Emergency Medicine | Admitting: Emergency Medicine

## 2016-01-14 ENCOUNTER — Other Ambulatory Visit: Payer: Self-pay

## 2016-01-14 ENCOUNTER — Encounter (HOSPITAL_COMMUNITY): Payer: Self-pay | Admitting: Emergency Medicine

## 2016-01-14 DIAGNOSIS — Z7901 Long term (current) use of anticoagulants: Secondary | ICD-10-CM | POA: Diagnosis not present

## 2016-01-14 DIAGNOSIS — I509 Heart failure, unspecified: Secondary | ICD-10-CM | POA: Insufficient documentation

## 2016-01-14 DIAGNOSIS — M7989 Other specified soft tissue disorders: Secondary | ICD-10-CM | POA: Insufficient documentation

## 2016-01-14 DIAGNOSIS — K219 Gastro-esophageal reflux disease without esophagitis: Secondary | ICD-10-CM | POA: Insufficient documentation

## 2016-01-14 DIAGNOSIS — I10 Essential (primary) hypertension: Secondary | ICD-10-CM | POA: Diagnosis not present

## 2016-01-14 DIAGNOSIS — Z79899 Other long term (current) drug therapy: Secondary | ICD-10-CM | POA: Diagnosis not present

## 2016-01-14 DIAGNOSIS — Z8669 Personal history of other diseases of the nervous system and sense organs: Secondary | ICD-10-CM | POA: Insufficient documentation

## 2016-01-14 DIAGNOSIS — R635 Abnormal weight gain: Secondary | ICD-10-CM | POA: Diagnosis present

## 2016-01-14 DIAGNOSIS — R600 Localized edema: Secondary | ICD-10-CM | POA: Insufficient documentation

## 2016-01-14 DIAGNOSIS — Z9889 Other specified postprocedural states: Secondary | ICD-10-CM | POA: Insufficient documentation

## 2016-01-14 DIAGNOSIS — Z853 Personal history of malignant neoplasm of breast: Secondary | ICD-10-CM | POA: Diagnosis not present

## 2016-01-14 DIAGNOSIS — Z8739 Personal history of other diseases of the musculoskeletal system and connective tissue: Secondary | ICD-10-CM | POA: Insufficient documentation

## 2016-01-14 DIAGNOSIS — I4891 Unspecified atrial fibrillation: Secondary | ICD-10-CM | POA: Diagnosis not present

## 2016-01-14 DIAGNOSIS — R609 Edema, unspecified: Secondary | ICD-10-CM

## 2016-01-14 DIAGNOSIS — Z85038 Personal history of other malignant neoplasm of large intestine: Secondary | ICD-10-CM | POA: Diagnosis not present

## 2016-01-14 LAB — CBC WITH DIFFERENTIAL/PLATELET
BASOS ABS: 0 10*3/uL (ref 0.0–0.1)
Basophils Relative: 0 %
Eosinophils Absolute: 0.4 10*3/uL (ref 0.0–0.7)
Eosinophils Relative: 7 %
HEMATOCRIT: 37.7 % — AB (ref 39.0–52.0)
HEMOGLOBIN: 13.2 g/dL (ref 13.0–17.0)
Lymphocytes Relative: 30 %
Lymphs Abs: 1.7 10*3/uL (ref 0.7–4.0)
MCH: 34.2 pg — ABNORMAL HIGH (ref 26.0–34.0)
MCHC: 35 g/dL (ref 30.0–36.0)
MCV: 97.7 fL (ref 78.0–100.0)
MONO ABS: 0.7 10*3/uL (ref 0.1–1.0)
Monocytes Relative: 12 %
Neutro Abs: 2.9 10*3/uL (ref 1.7–7.7)
Neutrophils Relative %: 52 %
Platelets: ADEQUATE 10*3/uL (ref 150–400)
RBC: 3.86 MIL/uL — ABNORMAL LOW (ref 4.22–5.81)
RDW: 13.1 % (ref 11.5–15.5)
WBC: 5.7 10*3/uL (ref 4.0–10.5)

## 2016-01-14 LAB — COMPREHENSIVE METABOLIC PANEL
ALK PHOS: 115 U/L (ref 38–126)
ALT: 19 U/L (ref 17–63)
AST: 44 U/L — AB (ref 15–41)
Albumin: 2.9 g/dL — ABNORMAL LOW (ref 3.5–5.0)
Anion gap: 7 (ref 5–15)
BILIRUBIN TOTAL: 1.5 mg/dL — AB (ref 0.3–1.2)
BUN: 14 mg/dL (ref 6–20)
CALCIUM: 8.8 mg/dL — AB (ref 8.9–10.3)
CO2: 29 mmol/L (ref 22–32)
CREATININE: 0.94 mg/dL (ref 0.61–1.24)
Chloride: 103 mmol/L (ref 101–111)
GFR calc Af Amer: >60 mL/min (ref >60)
Glucose, Bld: 106 mg/dL — ABNORMAL HIGH (ref 65–99)
POTASSIUM: 4.3 mmol/L (ref 3.5–5.1)
Sodium: 139 mmol/L (ref 135–145)
TOTAL PROTEIN: 6.5 g/dL (ref 6.5–8.1)

## 2016-01-14 LAB — BRAIN NATRIURETIC PEPTIDE: B NATRIURETIC PEPTIDE 5: 256 pg/mL — AB (ref 0.0–100.0)

## 2016-01-14 MED ORDER — FUROSEMIDE 10 MG/ML IJ SOLN
80.0000 mg | INTRAMUSCULAR | Status: AC
  Administered 2016-01-14: 80 mg via INTRAVENOUS
  Filled 2016-01-14: qty 8

## 2016-01-14 NOTE — Discharge Instructions (Signed)

## 2016-01-14 NOTE — ED Provider Notes (Signed)
CSN: [id number]     Arrival date & time 01/14/16  1116 History  By signing my name below, I, [name], attest that this documentation has been prepared under the direction and in the presence of Peter Chapel, Beasley. Electronically Signed: Julien Beasley, ED Scribe. 01/14/2016. 12:24 PM.    Chief Complaint  Patient presents with  . Weight Gain      The history is provided by the patient. No language interpreter was used.   HPI Comments: [name] is a 70 y.o. male who has a hx of CHF and colon cancer presents to the Emergency Department complaining of sudden onset, gradual worsening weight gain onset 2 days ago. Wife notes pt has gained 10 pounds of fluid inin the a past 2 days. He has a hx of gaining fluid that is resolved by doubling lasix but she notes it has alleviated his symptoms at all. He is currently taking Eloquis. Pt has been on fluid pills for about 1 year and his last EKG was in September Pt has not been taking urine as normally as possible. He denies chest pain, orthopnea, no PND, shortness of breath, hx of MI, and ETOH use.  Past Medical History  Diagnosis Date  . Varicose veins   . New onset atrial fibrillation ([place]) 01/02/2015  . Morbid obesity ([name]) 01/02/2015  . Hypertension   . Sleep apnea     been tested but has not received the CPAP yet  . GERD (gastroesophageal reflux disease)   . History of gout   . Cancer ([place]) 02/2015    colon  . Family history of colon cancer   . Family history of breast cancer in mother    Past Surgical History  Procedure Laterality Date  . Vein ligation and stripping      left leg  . Colonoscopy N/A 03/28/2015    SLF: 1. Abdominal pain, diarrhea, rectal bleeding due to obstructing colon mass  . Esophagogastroduodenoscopy N/A 03/28/2015    SLF: 1. dysphagia 2. Mild non-erosive gastritis.   . Esophageal dilation N/A 03/28/2015    Procedure: ESOPHAGEAL DILATION;  Surgeon: Peter Binder, Beasley;  Location: [facility] ENDO SUITE;  Service:  Endoscopy;  Laterality: N/A;  . Partial colectomy N/A 04/03/2015    Procedure: PARTIAL COLECTOMY;  Surgeon: Peter Signs Md, Beasley;  Location: [facility] ORS;  Service: General;  Laterality: N/A;  . Portacath placement N/A 05/10/2015    Procedure: INSERTION PORT-A-CATH;  Surgeon: Peter Signs Md, Beasley;  Location: [facility] ORS;  Service: General;  Laterality: N/A;  left subclavian  . Skin cancer destruction Left 08/16/15    left side of nose and left back  . Colonoscopy N/A 10/03/2015    SLF: normal anastomosis, fair prep (polyps less than 1cm could be missed), anal fissures and verrucous anal canal lesion with benign biopsy. Next TCS in 09/2016.  . Colonoscopy N/A 10/02/2015    SLF: normal anastomosis, poor bowel prep, three anal fissures   Family History  Problem Relation Age of Onset  . Breast cancer Mother     dx under 7  . Colon cancer Mother     dx in her mid 76s  . Heart disease Father   . Hyperlipidemia Father   . Liver cancer Father     heavy ETOH user when young  . Cancer Sister     slow "blood" cancer  . Diabetes Sister   . Heart disease Sister   . Diabetes Brother   . Cancer Maternal Aunt  2-3 maternal aunts with Cancer NOS  . Lung cancer Paternal Uncle     three uncles with lung cancer - all smokers  . Cancer Paternal Grandmother     possible bone cancer   Social History  Substance Use Topics  . Smoking status: Never Smoker   . Smokeless tobacco: Never Used  . Alcohol Use: No    Review of Systems  Cardiovascular: Positive for leg swelling.  All other systems reviewed and are negative.     Allergies  Review of patient's allergies indicates no known allergies.  Home Medications   Prior to Admission medications   Medication Sig Start Date End Date Taking? Authorizing Provider  albuterol (PROVENTIL HFA;VENTOLIN HFA) 108 (90 Base) MCG/ACT inhaler Inhale 1-2 puffs into the lungs every 6 (six) hours as needed for wheezing or shortness of breath.   Yes Historical Provider, Beasley   dicyclomine (BENTYL) 10 MG capsule 1 po 30 MINS BEFORE BREAKFAST AND MAY REPEAT BEFORE LUNCH. Patient taking differently: Take 10 mg by mouth 2 (two) times daily. 1 po 30 MINS BEFORE BREAKFAST AND MAY REPEAT BEFORE LUNCH. 10/30/15  Yes Peter L Fields, Beasley  ELIQUIS 5 MG TABS tablet TAKE 1 TABLET BY MOUTH TWICE A DAY 08/16/15  Yes Historical Provider, Beasley  escitalopram (LEXAPRO) 20 MG tablet Take 1 tablet (20 mg total) by mouth daily. 01/02/16  Yes Peter Hilding Kefalas, PA-C  furosemide (LASIX) 40 MG tablet Take 40 mg by mouth daily.  11/30/15  Yes Historical Provider, Beasley  levETIRAcetam (KEPPRA) 500 MG tablet Take 1 tablet (500 mg total) by mouth 2 (two) times daily. 10/24/15  Yes [name], Beasley  lidocaine-prilocaine (EMLA) cream Apply a quarter size amount to port site 1 hour prior to chemo. Do not rub in. Cover with plastic wrap. 04/28/15  Yes Peter Ranks, Beasley  lisinopril (PRINIVIL,ZESTRIL) 10 MG tablet Take 1 tablet (10 mg total) by mouth daily. 01/04/15  Yes [name], Beasley  nitroGLYCERIN (NITROGLYN) 2 % ointment Apply 0.5 inches topically as needed for chest pain. For rectal area   Yes Historical Provider, Beasley  omeprazole (PRILOSEC) 20 MG capsule Take 1 capsule (20 mg total) by mouth daily. 03/15/15  Yes Peter Menghini, PA-C  potassium chloride SA (K-DUR,KLOR-CON) 20 MEQ tablet Take 1 tablet (20 mEq total) by mouth daily. 10/13/15  Yes Peter Lenis, Beasley  psyllium (METAMUCIL) 58.6 % packet Take 1 packet by mouth daily.   Yes Historical Provider, Beasley   Triage vitals: BP 92/55 mmHg  Pulse 65  Temp(Src) 98.2 F (36.8 C) (Oral)  Resp 20  Ht 5\' 7"  (1.702 m)  Wt 254 lb (115.214 kg)  BMI 39.77 kg/m2  SpO2 100% Physical Exam  Constitutional: He appears well-developed and well-nourished. No distress.  HENT:  Head: Normocephalic and atraumatic.  Mouth/Throat: Oropharynx is clear and moist. No oropharyngeal exudate.  Eyes: Conjunctivae and EOM are normal. Pupils are equal, round, and reactive to  light. Right eye exhibits no discharge. Left eye exhibits no discharge. No scleral icterus.  Neck: Normal range of motion. Neck supple. No JVD present. No thyromegaly present.  Cardiovascular: Normal rate, regular rhythm, normal heart sounds and intact distal pulses.  Exam reveals no gallop and no friction rub.   No murmur heard. Pulmonary/Chest: Effort normal and breath sounds normal. No respiratory distress. He has no wheezes. He has no rales.  Clear lungs, no rales  Abdominal: Soft. Bowel sounds are normal. He exhibits no distension and no mass. There is  no tenderness.  Musculoskeletal: Normal range of motion. He exhibits edema. He exhibits no tenderness.  2+ edema bilaterally symmetrical  Lymphadenopathy:    He has no cervical adenopathy.  Neurological: He is alert. Coordination normal.  Skin: Skin is warm and dry. No rash noted. No erythema.  Psychiatric: He has a normal mood and affect. His behavior is normal.  Nursing note and vitals reviewed.   ED Course  Procedures  DIAGNOSTIC STUDIES: Oxygen Saturation is 100% on RA, normal by my interpretation.  COORDINATION OF CARE:  12:23 PM Discussed treatment plan which includes lasix through IV with pt at bedside and pt agreed to plan.  Labs Review Labs Reviewed  CBC WITH DIFFERENTIAL/PLATELET - Abnormal; Notable for the following:    RBC 3.86 (*)    HCT 37.7 (*)    MCH 34.2 (*)    All other components within normal limits  COMPREHENSIVE METABOLIC PANEL - Abnormal; Notable for the following:    Glucose, Bld 106 (*)    Calcium 8.8 (*)    Albumin 2.9 (*)    AST 44 (*)    Total Bilirubin 1.5 (*)    All other components within normal limits  BRAIN NATRIURETIC PEPTIDE - Abnormal; Notable for the following:    B Natriuretic Peptide 256.0 (*)    All other components within normal limits    Imaging Review No results found. I have personally reviewed and evaluated these images and lab results as part of my medical  decision-making.    MDM   Final diagnoses:  Peripheral edema    BNP, CBC and CMP normal other than low albumin - pt will need close f/u Has had great UOP after lasix -  Double X 3 days, then f/u on Thursday already arranged. Pt in agreement.  I personally performed the services described in this documentation, which was scribed in my presence. The recorded information has been reviewed and is accurate.       Peter Chapel, Beasley 01/14/16 1435

## 2016-01-14 NOTE — ED Notes (Signed)
Patient has hx of CHF. Per wife patient has had 10 pound weight gain in 2 days. Patient not voiding as much despite "doubling lasix" per wife. Patient has increased swelling in feet and legs. Patient denies any chest pain or shortness of breath.

## 2016-01-15 ENCOUNTER — Other Ambulatory Visit: Payer: Self-pay | Admitting: *Deleted

## 2016-01-15 ENCOUNTER — Ambulatory Visit: Payer: Self-pay | Admitting: *Deleted

## 2016-01-15 NOTE — Patient Outreach (Signed)
Message left on RN CM's phone 1/15 to cancel pt's home visit for 1/16- states  sister in law passed away, going to brother's home and will be back by Friday 1/20, requested a call on 1/20 to reschedule home visit.     Plan to call pt 1/20- reschedule home visit.    Zara Chess.   [facility]  [phone]

## 2016-01-19 ENCOUNTER — Ambulatory Visit: Payer: Self-pay | Admitting: *Deleted

## 2016-01-22 ENCOUNTER — Telehealth (HOSPITAL_COMMUNITY): Payer: Self-pay | Admitting: Physical Therapy

## 2016-01-22 ENCOUNTER — Ambulatory Visit (HOSPITAL_COMMUNITY): Payer: Medicare Other | Admitting: Physical Therapy

## 2016-01-22 NOTE — Telephone Encounter (Signed)
Pt has just fallen and she is waiting for the son to get there to get him up and take him to the MD. NF 01/22/16

## 2016-01-23 ENCOUNTER — Other Ambulatory Visit: Payer: Self-pay | Admitting: *Deleted

## 2016-01-23 ENCOUNTER — Ambulatory Visit: Payer: Self-pay | Admitting: *Deleted

## 2016-01-23 NOTE — Patient Outreach (Signed)
Attempt made to contact pt as spouse called [facility] office last week to relay message that both she and pt will not be home yet on 02/15/23 (out of town - death of family member), to f/u week of 1/23.   HIPPA compliant voice message left with contact number.   If no response, will try again.    Zara Chess.   [facility]  [phone]

## 2016-01-24 ENCOUNTER — Ambulatory Visit (HOSPITAL_COMMUNITY): Payer: Medicare Other | Admitting: Physical Therapy

## 2016-01-24 DIAGNOSIS — R29898 Other symptoms and signs involving the musculoskeletal system: Secondary | ICD-10-CM

## 2016-01-24 DIAGNOSIS — R262 Difficulty in walking, not elsewhere classified: Secondary | ICD-10-CM

## 2016-01-24 DIAGNOSIS — R269 Unspecified abnormalities of gait and mobility: Secondary | ICD-10-CM

## 2016-01-24 DIAGNOSIS — R296 Repeated falls: Secondary | ICD-10-CM

## 2016-01-24 NOTE — Therapy (Signed)
[place], [place], [address] Phone: [phone]   Fax:  [phone]  Physical Therapy Treatment  Patient Details  Name: [name] MRN: [id number] Date of Birth: 06/09/1946 Referring Provider: Luan Pulling   Encounter Date: 01/24/2016      PT End of Session - 01/24/16 1205    Visit Number 4   Number of Visits 18   Date for PT Re-Evaluation 02/07/16   Authorization Type Medicare   Authorization - Visit Number 4   Authorization - Number of Visits 10   PT Start Time [id number]   PT Stop Time 1150   PT Time Calculation (min) 45 min   Equipment Utilized During Treatment --   Activity Tolerance Patient tolerated treatment well   Behavior During Therapy [facility] for tasks assessed/performed      Past Medical History  Diagnosis Date  . Varicose veins   . New onset atrial fibrillation ([place]) 01/02/2015  . Morbid obesity ([place]) 01/02/2015  . Hypertension   . Sleep apnea     been tested but has not received the CPAP yet  . GERD (gastroesophageal reflux disease)   . History of gout   . Cancer ([place]) 02/2015    colon  . Family history of colon cancer   . Family history of breast cancer in mother     Past Surgical History  Procedure Laterality Date  . Vein ligation and stripping      left leg  . Colonoscopy N/A 03/28/2015    SLF: 1. Abdominal pain, diarrhea, rectal bleeding due to obstructing colon mass  . Esophagogastroduodenoscopy N/A 03/28/2015    SLF: 1. dysphagia 2. Mild non-erosive gastritis.   . Esophageal dilation N/A 03/28/2015    Procedure: ESOPHAGEAL DILATION;  Surgeon: [name], MD;  Location: [facility] ENDO SU[facility];  Service: Endoscopy;  Laterality: N/A;  . Partial colectomy N/A 04/03/2015    Procedure: PARTIAL COLECTOMY;  Surgeon: [name], MD;  Location: [facility] ORS;  Service: General;  Laterality: N/A;  . Portacath placement N/A 05/10/2015    Procedure: INSERTION PORT-A-CATH;  Surgeon: [name], MD;   Location: [facility] ORS;  Service: General;  Laterality: N/A;  left subclavian  . Skin cancer destruction Left 08/16/15    left side of nose and left back  . Colonoscopy N/A 10/03/2015    SLF: normal anastomosis, fair prep (polyps less than 1cm could be missed), anal fissures and verrucous anal canal lesion with benign biopsy. Next TCS in 09/2016.  . Colonoscopy N/A 10/02/2015    SLF: normal anastomosis, poor bowel prep, three anal fissures    There were no vitals filed for this visit.  Visit Diagnosis:  Abnormality of gait  Difficulty walking  Leg weakness, bilateral  Falls frequently      Subjective Assessment - 01/24/16 1105    Subjective Pt states he fell on 1/23 due to chair sliding out from under him.  STates his son had to come help get him up but didnt hurt himself.  Currently only with a little knee arthritis aching but no pain.   Currently in Pain? No/denies                         [facility] Adult PT Treatment/Exercise - 01/24/16 1111    Knee/Hip Exercises: Standing   Heel Raises 15 reps   Heel Raises Limitations heel and toe raises   Hip Abduction Both;15 reps   Hip Extension  Both;15 reps   Functional Squat 15 reps   SLS 30" with 1 HHA; Lt 6" Rt 6"   Other Standing Knee Exercises tandem 30" each with limited HHA   Other Standing Knee Exercises postural 3 RTB 10 reps each   Knee/Hip Exercises: Seated   Long Arc Quad 10 reps   Sit to [employer] without UE support;10 reps   Knee/Hip Exercises: Supine   Bridges Limitations 15   Straight Leg Raises Both;15 reps                  PT Short Term Goals - 01/10/16 1152    PT SHORT TERM GOAL #1   Title Pt to be I in Hep in order to obtain goals in a timely manner.    Time 3   Period Weeks   Status New   PT SHORT TERM GOAL #2   Title Patient will be able to ambulate 31minutes without resting in order to progress to better health habits.    Time 3   Period Weeks   Status New   PT SHORT TERM GOAL #3   Title  Pt to be able to stand for 10 minutes to be able to complete self grooming activity without fear of falling    Time 3   Period Weeks           PT Long Term Goals - 01/10/16 1154    PT LONG TERM GOAL #1   Title Pt to be I in advance HEP in order to decrease frequency of falling    Time 6   Period Weeks   Status New   PT LONG TERM GOAL #2   Title Pt to have not fallen in the past two weeks for reduced risk of injury    Time 6   Period Weeks   Status New   PT LONG TERM GOAL #3   Title Pt to be able to walk for 15 minutes without stopping for better health habits   Time 6   Period Weeks   Status New   PT LONG TERM GOAL #4   Title Pt to be able to stand for 15 minutes to be safe in shower    Time 6   Period Weeks   PT LONG TERM GOAL #5   Title Pt strength to be increased by one grade to be able to walk in his home with a cane    Time 6   Period Weeks   Status New               Plan - 01/24/16 1207    Clinical Impression Statement PT has not been to clinic X 10 days, however reports compliance with HEP.  PT slipped and fell out his chair while completing sit to stand exercises at home.  Resumed therex today with increase in repetitions without difficulty.  Pt with noted weakness completing SLR in supine.  Added postural 3 exericise to begin working on core staiblity and postural strengthenign.     PT Next Visit Plan Continue with current PT POC for strengthening, balance and standing tolerance.  PRogress standing exercises next session        Problem List Patient Active Problem List   Diagnosis Date Noted  . 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 Altered mental status   . Abnormal LFTs   . Fecal incontinence   . Peripheral edema 10/15/2015  . Acute diastolic CHF (congestive heart failure) ([place]) 10/15/2015  .  Generalized weakness 10/15/2015  . DVT (deep venous thrombosis) ([place]) 10/15/2015  . Atrial fibrillation ([place]) 10/15/2015  . Change in bowel  habits   . Malignant neoplasm of colon ([place])   . Colon cancer ([place])   . Constipation 09/20/2015  . Elevated liver enzymes 09/20/2015  . Cellulitis 08/04/2015  . Family history of colon cancer   . Family history of breast cancer in mother   . Colon carcinoma ([place]) 04/03/2015  . Occult blood in stools   . Dysphagia, pharyngoesophageal phase   . Melena 03/15/2015  . Heme positive stool 03/15/2015  . Esophageal dysphagia 03/15/2015  . Diarrhea 03/15/2015  . New onset atrial fibrillation ([place]) 01/02/2015  . Morbid obesity ([place]) 01/02/2015  . Varicose veins of lower extremities with other complications [id number]    Teena Irani, PTA/CLT [phone]  01/24/2016, 12:20 PM  [place], [place], [address] Phone: [phone]   Fax:  [phone]  Name: [name] MRN: [id number] Date of Birth: [birth year]

## 2016-01-26 ENCOUNTER — Other Ambulatory Visit: Payer: Self-pay | Admitting: *Deleted

## 2016-01-26 ENCOUNTER — Ambulatory Visit (HOSPITAL_COMMUNITY): Payer: Medicare Other

## 2016-01-26 DIAGNOSIS — R262 Difficulty in walking, not elsewhere classified: Secondary | ICD-10-CM

## 2016-01-26 DIAGNOSIS — R296 Repeated falls: Secondary | ICD-10-CM

## 2016-01-26 DIAGNOSIS — R29898 Other symptoms and signs involving the musculoskeletal system: Secondary | ICD-10-CM

## 2016-01-26 DIAGNOSIS — R269 Unspecified abnormalities of gait and mobility: Secondary | ICD-10-CM

## 2016-01-26 DIAGNOSIS — R6889 Other general symptoms and signs: Secondary | ICD-10-CM

## 2016-01-26 NOTE — Therapy (Signed)
[place], [place], [address] Phone: [phone]   Fax:  [phone]  Physical Therapy Treatment  Patient Details  Name: [name] MRN: [id number] Date of Birth: 08-22-1946 Referring Provider: Luan Pulling   Encounter Date: 01/26/2016      PT End of Session - 01/26/16 1345    Visit Number 5   Number of Visits 18   Date for PT Re-Evaluation 02/07/16   Authorization Type Medicare   Authorization - Visit Number 5   Authorization - Number of Visits 10   PT Start Time 1300   PT Stop Time [id number]   PT Time Calculation (min) 43 min   Equipment Utilized During Treatment Gait belt   Activity Tolerance Patient tolerated treatment well;No increased pain   Behavior During Therapy [facility] for tasks assessed/performed      Past Medical History  Diagnosis Date  . Varicose veins   . New onset atrial fibrillation ([place]) 01/02/2015  . Morbid obesity ([place]) 01/02/2015  . Hypertension   . Sleep apnea     been tested but has not received the CPAP yet  . GERD (gastroesophageal reflux disease)   . History of gout   . Cancer ([place]) 02/2015    colon  . Family history of colon cancer   . Family history of breast cancer in mother     Past Surgical History  Procedure Laterality Date  . Vein ligation and stripping      left leg  . Colonoscopy N/A 03/28/2015    SLF: 1. Abdominal pain, diarrhea, rectal bleeding due to obstructing colon mass  . Esophagogastroduodenoscopy N/A 03/28/2015    SLF: 1. dysphagia 2. Mild non-erosive gastritis.   . Esophageal dilation N/A 03/28/2015    Procedure: ESOPHAGEAL DILATION;  Surgeon: [name], MD;  [facility];  Service: Endoscopy;  Laterality: N/A;  . Partial colectomy N/A 04/03/2015    Procedure: PARTIAL COLECTOMY;  Surgeon: [name], MD;  Location: [facility] ORS;  Service: General;  Laterality: N/A;  . Portacath placement N/A 05/10/2015    Procedure: INSERTION PORT-A-CATH;  Surgeon:  [name], MD;  Location: [facility] ORS;  Service: General;  Laterality: N/A;  left subclavian  . Skin cancer destruction Left 08/16/15    left side of nose and left back  . Colonoscopy N/A 10/03/2015    SLF: normal anastomosis, fair prep (polyps less than 1cm could be missed), anal fissures and verrucous anal canal lesion with benign biopsy. Next TCS in 09/2016.  . Colonoscopy N/A 10/02/2015    SLF: normal anastomosis, poor bowel prep, three anal fissures    There were no vitals filed for this visit.  Visit Diagnosis:  Abnormality of gait  Difficulty walking  Leg weakness, bilateral  Falls frequently  Decreased functional activity tolerance      Subjective Assessment - 01/26/16 1304    Subjective Pt reported " I feel like I'm getting a little better" with improved tolerance with walking and less difficulty with transfers. Pt c/o R knee pain that was rated a 5/10 on a VAS upon arrival that ranges between 5-9/10 on a VAS.    Pertinent History Patient had cancer surgery in april 2016 to removed a18inches of colon, mD beleives they removed all of colon, Patient began Chemo 05/17/15. patient had surgery 20 years ago to "strip veins" and has sicne had swelling in both legs. patint [place] shooting pool, patient states limited physical activity due to bad knee  and limited energy levels noting "I can t walk across the road."  "I feel worn out to where i can sit up for a while then have to sit back down and rest.  little pain with sit to stance.    Limitations Walking;Standing   How long can you sit comfortably? no limitations   How long can you stand comfortably? 5-10 minutes    How long can you walk comfortably? 5 minutes    Patient Stated Goals to have have more energy, to be able  to walk further; to get rid of the walker; reduce pain levels of the R knee    Currently in Pain? Yes   Pain Score 5    Pain Location Knee   Pain Orientation Right   Pain Descriptors / Indicators Aching   Pain Type  Chronic pain   Pain Onset More than a month ago   Pain Frequency Intermittent   Aggravating Factors  WB activities   Pain Relieving Factors rest and sitting   Effect of Pain on Daily Activities difficulty with ambulation and standing with ADLs    Multiple Pain Sites No                         [facility] Adult PT Treatment/Exercise - 01/26/16 0001    Knee/Hip Exercises: Standing   Heel Raises 15 reps;1 set   Heel Raises Limitations heel and toe raises   Hip Flexion 1 set;15 reps   Hip Flexion Limitations with UE support   Hip Abduction Both;15 reps;1 set   Hip Extension Both;15 reps;1 set   Functional Squat 1 set;15 reps   Functional Squat Limitations with UE support              Balance Exercises - 01/26/16 1340    Balance Exercises: Standing   Standing Eyes Opened Narrow base of support (BOS);Foam/compliant surface;30 secs;4 reps   Standing Eyes Closed Wide (BOA);Foam/compliant surface;3 reps;30 secs   Sidestepping Upper extremity support;Other (comment)  8 laps   Step Over Hurdles / Cones x8 sets over 3 hurdles  with B UE support between parallel bars           PT Education - 01/26/16 1721    Education provided Yes   Education Details Educated the pt on 5/5 fall precautions, current HEP, and on the importance of remaining active t/o the day including indoor ambulation and repeated sit to stand transfers   Person(s) Educated Patient   Methods Explanation;Demonstration   Comprehension Verbalized understanding;Returned demonstration;Need further instruction          PT Short Term Goals - 01/10/16 1152    PT SHORT TERM GOAL #1   Title Pt to be I in Hep in order to obtain goals in a timely manner.    Time 3   Period Weeks   Status New   PT SHORT TERM GOAL #2   Title Patient will be able to ambulate 46minutes without resting in order to progress to better health habits.    Time 3   Period Weeks   Status New   PT SHORT TERM GOAL #3   Title Pt to  be able to stand for 10 minutes to be able to complete self grooming activity without fear of falling    Time 3   Period Weeks           PT Long Term Goals - 01/10/16 1154    PT LONG TERM GOAL #  1   Title Pt to be I in advance HEP in order to decrease frequency of falling    Time 6   Period Weeks   Status New   PT LONG TERM GOAL #2   Title Pt to have not fallen in the past two weeks for reduced risk of injury    Time 6   Period Weeks   Status New   PT LONG TERM GOAL #3   Title Pt to be able to walk for 15 minutes without stopping for better health habits   Time 6   Period Weeks   Status New   PT LONG TERM GOAL #4   Title Pt to be able to stand for 15 minutes to be safe in shower    Time 6   Period Weeks   PT LONG TERM GOAL #5   Title Pt strength to be increased by one grade to be able to walk in his home with a cane    Time 6   Period Weeks   Status New               Plan - 01/26/16 1345    Clinical Impression Statement PT tx was focused on static/dynamic standing balance activities and functional LE strengthening. Pt presented with posterior directed LOB with static standing on airex pad with EC at 8 and 11 seconds with first two trials completed, respectively. Min assist required on multiple times to regain balance. Instructed the pt to assume a wider BOS with improved performance demo. Verbal and tactile cues were provided during balance training activities in order to improve postural awareness. Pt was able to progress form B UE support to unilateral UE support with side steps and step over/hurdle activity. Occasional seated rest breaks were required secondary to generalized fatigue. R knee pain remained at baseline level at the completion of today's PT visit. Pt is responding well to current PT POC and would benefit from continued skilled PT to address balance and LE strength deficits.    Pt will benefit from skilled therapeutic intervention in order to improve on  the following deficits Abnormal gait;Decreased activity tolerance;Decreased balance;Decreased endurance;Decreased strength;Difficulty walking;Pain;Postural dysfunction   Rehab Potential Good   PT Frequency 3x / week   PT Duration 6 weeks   PT Treatment/Interventions Gait training;Functional mobility training;Therapeutic activities;Therapeutic exercise;Balance training;Patient/family education   PT Next Visit Plan Continue with current PT POC for strengthening, balance and standing tolerance.     PT Home Exercise Plan Reviewed HEP   Consulted and Agree with Plan of Care Patient        Problem List Patient Active Problem List   Diagnosis Date Noted  . 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 Altered mental status   . Abnormal LFTs   . Fecal incontinence   . Peripheral edema 10/15/2015  . Acute diastolic CHF (congestive heart failure) ([place]) 10/15/2015  . Generalized weakness 10/15/2015  . DVT (deep venous thrombosis) ([place]) 10/15/2015  . Atrial fibrillation ([place]) 10/15/2015  . Change in bowel habits   . Malignant neoplasm of colon ([place])   . Colon cancer ([place])   . Constipation 09/20/2015  . Elevated liver enzymes 09/20/2015  . Cellulitis 08/04/2015  . Family history of colon cancer   . Family history of breast cancer in mother   . Colon carcinoma ([place]) 04/03/2015  . Occult blood in stools   . Dysphagia, pharyngoesophageal phase   . Melena 03/15/2015  . Heme positive stool 03/15/2015  .  Esophageal dysphagia 03/15/2015  . Diarrhea 03/15/2015  . New onset atrial fibrillation ([place]) 01/02/2015  . Morbid obesity ([place]) 01/02/2015  . Varicose veins of lower extremities with other complications [id number]    Garen Lah, PT, DPT     01/26/2016, 5:31 PM  [facility] [address] [place], [place], [address] Phone: [phone]   Fax:  [phone]  Name: [name] MRN: [id number] Date of Birth: 07-Feb-1946

## 2016-01-26 NOTE — Patient Outreach (Signed)
Second attempt to contact pt, schedule home visit.   HIPPA compliant voice message left with name, contact number.  If no response, will try again.     Zara Chess.   [facility]  [phone]

## 2016-01-29 ENCOUNTER — Ambulatory Visit (HOSPITAL_COMMUNITY): Payer: Medicare Other

## 2016-01-29 DIAGNOSIS — R269 Unspecified abnormalities of gait and mobility: Secondary | ICD-10-CM

## 2016-01-29 DIAGNOSIS — R6889 Other general symptoms and signs: Secondary | ICD-10-CM

## 2016-01-29 DIAGNOSIS — R29898 Other symptoms and signs involving the musculoskeletal system: Secondary | ICD-10-CM

## 2016-01-29 DIAGNOSIS — R296 Repeated falls: Secondary | ICD-10-CM

## 2016-01-29 DIAGNOSIS — R262 Difficulty in walking, not elsewhere classified: Secondary | ICD-10-CM

## 2016-01-29 NOTE — Therapy (Signed)
[place], [place], [address] Phone: [phone]   Fax:  [phone]  Physical Therapy Treatment  Patient Details  Name: [name] MRN: [id number] Date of Birth: 01/29/1946 Referring Provider: Luan Pulling   Encounter Date: 01/29/2016      PT End of Session - 01/29/16 1439    Visit Number 6   Number of Visits 18   Date for PT Re-Evaluation 02/07/16   Authorization Type Medicare   Authorization - Visit Number 6   Authorization - Number of Visits 10   PT Start Time [id number]   PT Stop Time [id number]   PT Time Calculation (min) 44 min   Equipment Utilized During Treatment Gait belt   Activity Tolerance Patient tolerated treatment well;No increased pain   Behavior During Therapy [facility] for tasks assessed/performed      Past Medical History  Diagnosis Date  . Varicose veins   . New onset atrial fibrillation ([place]) 01/02/2015  . Morbid obesity ([place]) 01/02/2015  . Hypertension   . Sleep apnea     been tested but has not received the CPAP yet  . GERD (gastroesophageal reflux disease)   . History of gout   . Cancer ([place]) 02/2015    colon  . Family history of colon cancer   . Family history of breast cancer in mother     Past Surgical History  Procedure Laterality Date  . Vein ligation and stripping      left leg  . Colonoscopy N/A 03/28/2015    SLF: 1. Abdominal pain, diarrhea, rectal bleeding due to obstructing colon mass  . Esophagogastroduodenoscopy N/A 03/28/2015    SLF: 1. dysphagia 2. Mild non-erosive gastritis.   . Esophageal dilation N/A 03/28/2015    Procedure: ESOPHAGEAL DILATION;  Surgeon: [name], MD;  [facility];  Service: Endoscopy;  Laterality: N/A;  . Partial colectomy N/A 04/03/2015    Procedure: PARTIAL COLECTOMY;  Surgeon: [name], MD;  Location: [facility] ORS;  Service: General;  Laterality: N/A;  . Portacath placement N/A 05/10/2015    Procedure: INSERTION PORT-A-CATH;  Surgeon:  [name], MD;  Location: [facility] ORS;  Service: General;  Laterality: N/A;  left subclavian  . Skin cancer destruction Left 08/16/15    left side of nose and left back  . Colonoscopy N/A 10/03/2015    SLF: normal anastomosis, fair prep (polyps less than 1cm could be missed), anal fissures and verrucous anal canal lesion with benign biopsy. Next TCS in 09/2016.  . Colonoscopy N/A 10/02/2015    SLF: normal anastomosis, poor bowel prep, three anal fissures    There were no vitals filed for this visit.  Visit Diagnosis:  Abnormality of gait  Difficulty walking  Leg weakness, bilateral  Falls frequently  Decreased functional activity tolerance      Subjective Assessment - 01/29/16 1430    Subjective Pt stated he feels balance is improving slowly, no reports of falls over weekend.  Current pain scale for Rt knee 7/10   Pertinent History Patient had cancer surgery in april 2016 to removed a18inches of colon, mD beleives they removed all of colon, Patient began Chemo 05/17/15. patient had surgery 20 years ago to "strip veins" and has sicne had swelling in both legs. pat[place] shooting pool, patient states limited physical activity due to bad knee and limited energy levels noting "I can t walk across the road."  "I feel worn out to where i can sit  up for a while then have to sit back down and rest.  little pain with sit to stance.    Patient Stated Goals to have have more energy, to be able  to walk further; to get rid of the walker; reduce pain levels of the R knee    Currently in Pain? Yes   Pain Score 7    Pain Location Knee   Pain Orientation Right   Pain Descriptors / Indicators Aching           [facility] Adult PT Treatment/Exercise - 01/29/16 0001    Knee/Hip Exercises: Standing   Heel Raises 20 reps;3 seconds   Heel Raises Limitations heel and toe raises   Hip Flexion 15 reps   Hip Flexion Limitations Toe tapping 6in step   Hip Abduction Both;15 reps   Abduction Limitations 2"  holds   Functional Squat 15 reps   Gait Training 6 min walk 487 ft   Knee/Hip Exercises: Seated   Sit to Sand 10 reps;without UE support             Balance Exercises - 01/29/16 1456    Balance Exercises: Standing   Tandem Stance Eyes open;Foam/compliant surface;3 reps;30 secs   Sidestepping 4 reps  no UE HHA 4RT infront of mat   Step Over Hurdles / Cones 3 RT over 4 hurdles             PT Short Term Goals - 01/10/16 1152    PT SHORT TERM GOAL #1   Title Pt to be I in Hep in order to obtain goals in a timely manner.    Time 3   Period Weeks   Status New   PT SHORT TERM GOAL #2   Title Patient will be able to ambulate 43minutes without resting in order to progress to better health habits.    Time 3   Period Weeks   Status New   PT SHORT TERM GOAL #3   Title Pt to be able to stand for 10 minutes to be able to complete self grooming activity without fear of falling    Time 3   Period Weeks           PT Long Term Goals - 01/10/16 1154    PT LONG TERM GOAL #1   Title Pt to be I in advance HEP in order to decrease frequency of falling    Time 6   Period Weeks   Status New   PT LONG TERM GOAL #2   Title Pt to have not fallen in the past two weeks for reduced risk of injury    Time 6   Period Weeks   Status New   PT LONG TERM GOAL #3   Title Pt to be able to walk for 15 minutes without stopping for better health habits   Time 6   Period Weeks   Status New   PT LONG TERM GOAL #4   Title Pt to be able to stand for 15 minutes to be safe in shower    Time 6   Period Weeks   PT LONG TERM GOAL #5   Title Pt strength to be increased by one grade to be able to walk in his home with a cane    Time 6   Period Weeks   Status New               Plan - 01/29/16 1801    Clinical Impression Statement  Session focus on improving functional strengthening and balance activities.  Progressed balance activities with less HHA with activities with min A required to  improve awareness of COG to reduce LOB episodes, usually posterior leans.  Began sidestepping with no HHA and toe tapping with intermitternt HHA PRN.  Pt limited by fatigue required several seated rest breaks due to weakness.  No reports of increased pain through session.     PT Next Visit Plan Continue with current PT POC for strengthening, balance and standing tolerance.          Problem List Patient Active Problem List   Diagnosis Date Noted  . 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 Altered mental status   . Abnormal LFTs   . Fecal incontinence   . Peripheral edema 10/15/2015  . Acute diastolic CHF (congestive heart failure) ([place]) 10/15/2015  . Generalized weakness 10/15/2015  . DVT (deep venous thrombosis) ([place]) 10/15/2015  . Atrial fibrillation ([place]) 10/15/2015  . Change in bowel habits   . Malignant neoplasm of colon ([place])   . Colon cancer ([place])   . Constipation 09/20/2015  . Elevated liver enzymes 09/20/2015  . Cellulitis 08/04/2015  . Family history of colon cancer   . Family history of breast cancer in mother   . Colon carcinoma ([place]) 04/03/2015  . Occult blood in stools   . Dysphagia, pharyngoesophageal phase   . Melena 03/15/2015  . Heme positive stool 03/15/2015  . Esophageal dysphagia 03/15/2015  . Diarrhea 03/15/2015  . New onset atrial fibrillation ([place]) 01/02/2015  . Morbid obesity ([place]) 01/02/2015  . Varicose veins of lower extremities with other complications [id number]   Ihor Austin, LPTA; CBIS [phone]  Aldona Lento 01/29/2016, 6:29 PM  [facility] [address] [place], [place], [address] Phone: [phone]   Fax:  [phone]  Name: [name] MRN: [id number] Date of Birth: 1946-04-22

## 2016-01-30 ENCOUNTER — Ambulatory Visit (INDEPENDENT_AMBULATORY_CARE_PROVIDER_SITE_OTHER): Payer: Medicare Other | Admitting: Adult Health

## 2016-01-30 ENCOUNTER — Encounter: Payer: Self-pay | Admitting: Adult Health

## 2016-01-30 VITALS — BP 98/58 | HR 93 | Ht 67.0 in | Wt 258.0 lb

## 2016-01-30 DIAGNOSIS — I5032 Chronic diastolic (congestive) heart failure: Secondary | ICD-10-CM

## 2016-01-30 DIAGNOSIS — I481 Persistent atrial fibrillation: Secondary | ICD-10-CM

## 2016-01-30 DIAGNOSIS — I4819 Other persistent atrial fibrillation: Secondary | ICD-10-CM

## 2016-01-30 NOTE — Patient Instructions (Signed)
Your physician recommends that you schedule a follow-up appointment in: 1 Month with Jory Sims, NP.  Your physician recommends that you continue on your current medications as directed. Please refer to the Current Medication list given to you today.  You have been given a Rx for compression socks today  If you need a refill on your cardiac medications before your next appointment, please call your pharmacy.  Thank you for choosing [facility]!   Fluid Restriction Some health conditions may require you to restrict your fluid intake. This means that you need to limit the amount of fluid you drink each day. When you have a fluid restriction, you must carefully measure and keep track of the amount of fluid you drink. Your health care provider will identify the specific amount of fluid you are allowed each day. This amount may depend on several things, such as:  The amount of urine you produce in a day.  How much fluid you are keeping (retaining) in your body.  Your blood pressure. WHAT IS MY PLAN? Your health care provider recommends that you limit your fluid intake to ____1500 ml______ per day. WHAT COUNTS TOWARD MY FLUID INTAKE? Your fluid intake includes all liquids that you drink, as well as any foods that become liquid at room temperature.  The following are examples of some fluids that you will have to restrict:  Tea, coffee, soda, lemonade, milk, water, juice, sport drinks, and nutritional supplement beverages.  Alcoholic beverages.  Cream.  Gravy.  Ice cubes.  Soup and broth. The following are examples of foods that become liquid at room temperature. These foods will also count toward your fluid intake.  Ice cream and ice milk.  Frozen yogurt and sherbet.  Frozen ice pops.  Flavored gelatin. HOW DO I KEEP TRACK OF MY FLUID INTAKE? Each morning, fill a jug with the amount of water that equals the amount of fluid you are allowed for the day. You can  use this water as a guideline for fluid allowance. Each time you take in any form of fluid, including ice cubes and foods that become liquid at room temperature, pour an equal amount of water out of the container. This helps you to see how much fluid you are taking in. It also helps you to see how much of your fluid intake is left for the rest of the day. The following conversions may also be helpful in measuring your fluid intake:  1 cup equals 8 oz (240 mL).   cup equals 6 oz (180 mL).   cup equals 5 oz (160 mL).   cup equals 4 oz (120 mL).   cup equals 2 oz (80 mL).   cup equals 2 oz (60 mL).  2 Tbsp equals 1 oz (30 mL). WHAT HOME CARE INSTRUCTIONS SHOULD I FOLLOW WHILE RESTRICTING FLUIDS?  Make sure that you stay within the recommended limit each day. Always measure and keep track of your fluids, as well as any foods that turn liquid at room temperature.  Use small cups and glasses and learn to sip fluids slowly.  Add a slice of fresh lemon or lemon juice to water or ice. This helps to satisfy your thirst.  Freeze fruit juice or water in an ice cube tray. Use this as part of your fluid allowance. These cubes are useful for quenching your thirst. Measure the amount of liquid in each ice cube prior to freezing so you can subtract this amount from your day's allowance when  you consume each frozen cube.  Try frozen fruits between meals, such as grapes or strawberries.  Swallow your pills along with meals or soft foods, such as applesauce or mashed potatoes. This helps you to save your fluid allowance for something that you enjoy.  Weigh yourself every day. Keeping track of your daily weight can help you and your health care provider to notice as soon as possible if you are retaining too much fluid in your body.  Weigh yourself every morning after you urinate but before you eat breakfast.  Wear the same amount of clothing each time you weigh yourself.  Write down your daily  weight. Give this weight record to your health care provider. If your weight is going up, you may be retaining too much fluid. Every 2 cups (480 mL) of fluid retained in the body becomes an extra 1 lb (0.45 kg) of body weight.  Avoid salty foods. These foods make you thirsty and make fluid control more difficult.  Brush your teeth often or rinse your mouth with mouthwash to help your dry mouth. Lemon wedges, hard sour candies, chewing gum, or breath spray may also help to moisten your mouth.  Keep the temperature in your home at a cooler level. Dry air increases thirst, so keep the air in your home as humid as possible.  Avoid being out in the hot sun, which can cause you to sweat and become thirsty. WHAT ARE SOME SIGNS THAT I MAY BE TAKING IN TOO MUCH FLUID? You may be taking in too much fluid if:  Your weight increases. Contact your health care provider if your weight increases 3 lb or more in a day or if it increases 5 lb or more in a week.  Your face, hands, legs, feet, and belly (abdomen) start to swell.  You have trouble breathing.   This information is not intended to replace advice given to you by your health care provider. Make sure you discuss any questions you have with your health care provider.   Document Released: 10/13/2007 Document Revised: 01/06/2015 Document Reviewed: 05/17/2014 Elsevier Interactive Patient Education [employer].

## 2016-01-30 NOTE — Progress Notes (Signed)
Cardiology Office Note   Date:  01/30/2016   ID:  [name], DOB 07/02/1946, MRN [id number]  PCP:  [name], MD  Cardiologist: Cloria Spring, NP   Chief Complaint  Patient presents with  . Congestive Heart Failure      History of Present Illness: [name] is a 70 y.o. male who presents for ongoing assessment and management of chronic diastolic CHF, history of right leg DVT, history of atrial fib CHADS VASC Score of 2 on Eliquis, hypertension, with multiple medical problems.  He was last seen in the emergency room on 01/14/2016 for weight gain and lower extremity edema.his Lasix was doubled for 3 days with a followup appointment to be scheduled with cardiology.  The patient diuresed well after being given Lasix in the ER.  He has followup appointment with Dr. Luan Pulling, primary care physician, on 01/26/2016, labs were completed, which revealed a sodium 138, potassium 3.6, BUN 23, creatinine 1.21.  He is here for ongoing followup for medication adjustments as his Lasix had not been working well.  Dr. Luan Pulling , placed  him on torsemide 40 mg 3 times a day.  He states that he is not getting very good response from the medicine.on further questioning, the patient is drinking good bit of fluid.  He also is taking the medication with food.  He continues with physical therapy.  He states his gait is some better, but he does not have his full strength back.  His wife states that he has gained approximately 10 pounds this month.  Past Medical History  Diagnosis Date  . Varicose veins   . New onset atrial fibrillation ([place]) 01/02/2015  . Morbid obesity ([place]) 01/02/2015  . Hypertension   . Sleep apnea     been tested but has not received the CPAP yet  . GERD (gastroesophageal reflux disease)   . History of gout   . Cancer ([place]) 02/2015    colon  . Family history of colon cancer   . Family history of breast cancer in mother     Past Surgical History   Procedure Laterality Date  . Vein ligation and stripping      left leg  . Colonoscopy N/A 03/28/2015    SLF: 1. Abdominal pain, diarrhea, rectal bleeding due to obstructing colon mass  . Esophagogastroduodenoscopy N/A 03/28/2015    SLF: 1. dysphagia 2. Mild non-erosive gastritis.   . Esophageal dilation N/A 03/28/2015    Procedure: ESOPHAGEAL DILATION;  Surgeon: [name], MD;  Location: [facility];  Service: Endoscopy;  Laterality: N/A;  . Partial colectomy N/A 04/03/2015    Procedure: PARTIAL COLECTOMY;  Surgeon: [name], MD;  Location: [facility] ORS;  Service: General;  Laterality: N/A;  . Portacath placement N/A 05/10/2015    Procedure: INSERTION PORT-A-CATH;  Surgeon: [name], MD;  Location: [facility];  Service: General;  Laterality: N/A;  left subclavian  . Skin cancer destruction Left 08/16/15    left side of nose and left back  . Colonoscopy N/A 10/03/2015    SLF: normal anastomosis, fair prep (polyps less than 1cm could be missed), anal fissures and verrucous anal canal lesion with benign biopsy. Next TCS in 09/2016.  . Colonoscopy N/A 10/02/2015    SLF: normal anastomosis, poor bowel prep, three anal fissures     Current Outpatient Prescriptions  Medication Sig Dispense Refill  . albuterol (PROVENTIL HFA;VENTOLIN HFA) 108 (90 Base) MCG/ACT inhaler Inhale 1-2 puffs into the  lungs every 6 (six) hours as needed for wheezing or shortness of breath.    . dicyclomine (BENTYL) 10 MG capsule 1 po 30 MINS BEFORE BREAKFAST AND MAY REPEAT BEFORE LUNCH. (Patient taking differently: Take 10 mg by mouth 2 (two) times daily. 1 po 30 MINS BEFORE BREAKFAST AND MAY REPEAT BEFORE LUNCH.) 60 capsule 11  . ELIQUIS 5 MG TABS tablet TAKE 1 TABLET BY MOUTH TWICE A DAY  12  . escitalopram (LEXAPRO) 20 MG tablet Take 1 tablet (20 mg total) by mouth daily. 30 tablet 5  . levETIRAcetam (KEPPRA) 500 MG tablet Take 1 tablet (500 mg total) by mouth 2 (two) times daily. 60 tablet 12  .  lidocaine-prilocaine (EMLA) cream Apply a quarter size amount to port site 1 hour prior to chemo. Do not rub in. Cover with plastic wrap. 30 g 3  . lisinopril (PRINIVIL,ZESTRIL) 10 MG tablet Take 1 tablet (10 mg total) by mouth daily. 30 tablet 12  . nitroGLYCERIN (NITROGLYN) 2 % ointment Apply 0.5 inches topically as needed for chest pain. For rectal area    . omeprazole (PRILOSEC) 20 MG capsule Take 1 capsule (20 mg total) by mouth daily. 30 capsule 3  . potassium chloride SA (K-DUR,KLOR-CON) 20 MEQ tablet Take 1 tablet (20 mEq total) by mouth daily. (Patient taking differently: Take 20 mEq by mouth 2 (two) times daily. ) 90 tablet 3  . psyllium (METAMUCIL) 58.6 % packet Take 1 packet by mouth daily.    [name] torsemide (DEMADEX) 20 MG tablet TK 2 TS PO BID  5   No current facility-administered medications for this visit.    Allergies:   Review of patient's allergies indicates no known allergies.    Social History:  The patient  reports that he has never smoked. He has never used smokeless tobacco. He reports that he does not drink alcohol or use illicit drugs.   Family History:  The patient's family history includes Breast cancer in his mother; Cancer in his maternal aunt, paternal grandmother, and sister; Colon cancer in his mother; Diabetes in his brother and sister; Heart disease in his father and sister; Hyperlipidemia in his father; Liver cancer in his father; Lung cancer in his paternal uncle.    ROS: All other systems are reviewed and negative. Unless otherwise mentioned in H&P    PHYSICAL EXAM: VS:  BP 98/58 mmHg  Pulse 93  Ht 5\' 7"  (1.702 m)  Wt 258 lb (117.028 kg)  BMI 40.40 kg/m2  SpO2 96% , BMI Body mass index is 40.4 kg/(m^2). GEN: Well nourished, well developed, in no acute distress HEENT: normal Neck: no JVD, carotid bruits, or masses Cardiac: IRRR; no murmurs, rubs, or gallops,Respiratory:  Essentially clear to auscultation, without any Rales, rhonchi or wheezing.  He  has no cough. GI: soft, nontender, nondistended, + BS MS: no deformity or atrophyhe does have bilateral 1+ pitting edema, greater on the left. Skin: warm and dry, no rash Neuro:  Strength and sensation are intact Psych: euthymic mood, full affect   Recent Labs: 10/17/2015: TSH 3.085 10/21/2015: Magnesium 2.1 01/14/2016: ALT 19; B Natriuretic Peptide 256.0*; BUN 14; Creatinine, Ser 0.94; Hemoglobin 13.2; Platelets PLATELET CLUMPS NOTED ON SMEAR, COUNT APPEARS ADEQUATE; Potassium 4.3; Sodium 139    Lipid Panel    Component Value Date/Time   CHOL 113 01/03/2015 0631   TRIG 104 01/03/2015 0631   HDL 37* 01/03/2015 0631   CHOLHDL 3.1 01/03/2015 0631   VLDL 21 01/03/2015 0631   LDLCALC  55 01/03/2015 0631      Wt Readings from Last 3 Encounters:  01/30/16 258 lb (117.028 kg)  01/14/16 254 lb (115.214 kg)  12/14/15 244 lb (110.678 kg)     ASSESSMENT AND PLAN:  1. Acute on chronic diastolic CHF: He continues to have some lower extremity edema.  He is quite sedentary.he is working with physical therapy, and uses a walker for ambulation.  We have spoken about fluid restriction.  He appears to be drinking a lot more fluid.  Because the torsemide is made him thirsty.  I have advised him to cut back to a 1500 cc fluid restricted diet.  He is also avoiding salt.  Due to venous insufficiency, I have advised him to have knee-high TED hose This will help with venous return and edema.  I also advised him to take torsemide on an empty stomach waiting 30 minutes prior to eating food for better bioavailability.  He is due to see Dr. Luan Pulling in a couple days.  If these measures are not helpful we may need to consider giving him one dose of metolazone 2.5 mg times one as a rescue dose to get things moving again.  I have given him a copy of his recent labs and they are all within normal limits.  Currently.  He is due to have repeat labs with Dr. Luan Pulling, tomorrow.   2. Atrial fibrillation: heart rate is  well controlled, currently, and blood pressure is stable.he denies any bleeding issues.  We will continue his current medication regimen.  3. Obesity, with generalized deconditioning: I am hopeful increase activity with physical therapy will help him with his generalized fatigue and deconditioning.  Use of the support stockings will also help to keep his legs from feeling so tired and sore, which may assist in increasing his willingness to walk more.  Current medicines are reviewed at length with the patient today.    Labs/ tests ordered today include: No orders of the defined types were placed in this encounter.     Disposition:   FU with 1 month. Signed, Jory Sims, NP  01/30/2016 2:56 PM    [facility] [address] [place], [place] [address] Phone: [phone]; Fax: [phone]

## 2016-01-30 NOTE — Progress Notes (Signed)
Name: [name]    DOB: 1946/08/18  Age: 70 y.o.  MR#: [id number]       PCP:  [name], MD      Insurance: Payor: MEDICARE / Plan: MEDICARE PART A AND B / Product Type: *No Product type* /   CC:   No chief complaint on file.   VS Filed Vitals:   01/30/16 1403  BP: 98/58  Pulse: 93  Height: 5\' 7"  (1.702 m)  Weight: 258 lb (117.028 kg)  SpO2: 96%    Weights Current Weight  01/30/16 258 lb (117.028 kg)  01/14/16 254 lb (115.214 kg)  12/14/15 244 lb (110.678 kg)    Blood Pressure  BP Readings from Last 3 Encounters:  01/30/16 98/58  01/14/16 107/66  12/14/15 103/61     Admit date:  (Not on file) Last encounter with RMR:  Visit date not found   Allergy Review of patient's allergies indicates no known allergies.  Current Outpatient Prescriptions  Medication Sig Dispense Refill  . albuterol (PROVENTIL HFA;VENTOLIN HFA) 108 (90 Base) MCG/ACT inhaler Inhale 1-2 puffs into the lungs every 6 (six) hours as needed for wheezing or shortness of breath.    . dicyclomine (BENTYL) 10 MG capsule 1 po 30 MINS BEFORE BREAKFAST AND MAY REPEAT BEFORE LUNCH. (Patient taking differently: Take 10 mg by mouth 2 (two) times daily. 1 po 30 MINS BEFORE BREAKFAST AND MAY REPEAT BEFORE LUNCH.) 60 capsule 11  . ELIQUIS 5 MG TABS tablet TAKE 1 TABLET BY MOUTH TWICE A DAY  12  . escitalopram (LEXAPRO) 20 MG tablet Take 1 tablet (20 mg total) by mouth daily. 30 tablet 5  . levETIRAcetam (KEPPRA) 500 MG tablet Take 1 tablet (500 mg total) by mouth 2 (two) times daily. 60 tablet 12  . lidocaine-prilocaine (EMLA) cream Apply a quarter size amount to port site 1 hour prior to chemo. Do not rub in. Cover with plastic wrap. 30 g 3  . lisinopril (PRINIVIL,ZESTRIL) 10 MG tablet Take 1 tablet (10 mg total) by mouth daily. 30 tablet 12  . nitroGLYCERIN (NITROGLYN) 2 % ointment Apply 0.5 inches topically as needed for chest pain. For rectal area    . omeprazole (PRILOSEC) 20 MG capsule Take 1 capsule  (20 mg total) by mouth daily. 30 capsule 3  . potassium chloride SA (K-DUR,KLOR-CON) 20 MEQ tablet Take 1 tablet (20 mEq total) by mouth daily. (Patient taking differently: Take 20 mEq by mouth 2 (two) times daily. ) 90 tablet 3  . psyllium (METAMUCIL) 58.6 % packet Take 1 packet by mouth daily.    [name] torsemide (DEMADEX) 20 MG tablet TK 2 TS PO BID  5   No current facility-administered medications for this visit.    Discontinued Meds:    Medications Discontinued During This Encounter  Medication Reason  . furosemide (LASIX) 40 MG tablet Error    Patient Active Problem List   Diagnosis Date Noted  . 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 Altered mental status   . Abnormal LFTs   . Fecal incontinence   . Peripheral edema 10/15/2015  . Acute diastolic CHF (congestive heart failure) ([place]) 10/15/2015  . Generalized weakness 10/15/2015  . DVT (deep venous thrombosis) ([place]) 10/15/2015  . Atrial fibrillation ([place]) 10/15/2015  . Change in bowel habits   . Malignant neoplasm of colon ([place])   . Colon cancer ([place])   . Constipation 09/20/2015  . Elevated liver enzymes 09/20/2015  . Cellulitis 08/04/2015  . Family history of  colon cancer   . Family history of breast cancer in mother   . Colon carcinoma ([place]) 04/03/2015  . Occult blood in stools   . Dysphagia, pharyngoesophageal phase   . Melena 03/15/2015  . Heme positive stool 03/15/2015  . Esophageal dysphagia 03/15/2015  . Diarrhea 03/15/2015  . New onset atrial fibrillation ([place]) 01/02/2015  . Morbid obesity ([place]) 01/02/2015  . Varicose veins of lower extremities with other complications [id number]    LABS    Component Value Date/Time   NA 139 01/14/2016 1200   NA 138 12/27/2015 1038   NA 135 12/14/2015 1813   K 4.3 01/14/2016 1200   K 3.7 12/27/2015 1038   K 4.3 12/14/2015 1813   CL 103 01/14/2016 1200   CL 103 12/27/2015 1038   CL 103 12/14/2015 1813   CO2 29 01/14/2016 1200   CO2 30 12/27/2015 1038    CO2 28 12/14/2015 1813   GLUCOSE 106* 01/14/2016 1200   GLUCOSE 98 12/27/2015 1038   GLUCOSE 112* 12/14/2015 1813   BUN 14 01/14/2016 1200   BUN 14 12/27/2015 1038   BUN 14 12/14/2015 1813   CREATININE 0.94 01/14/2016 1200   CREATININE 0.85 12/27/2015 1038   CREATININE 0.89 12/14/2015 1813   CALCIUM 8.8* 01/14/2016 1200   CALCIUM 8.9 12/27/2015 1038   CALCIUM 8.7* 12/14/2015 1813   GFRNONAA >60 01/14/2016 1200   GFRNONAA >60 12/27/2015 1038   GFRNONAA >60 12/14/2015 1813   GFRAA >60 01/14/2016 1200   GFRAA >60 12/27/2015 1038   GFRAA >60 12/14/2015 1813   CMP     Component Value Date/Time   NA 139 01/14/2016 1200   K 4.3 01/14/2016 1200   CL 103 01/14/2016 1200   CO2 29 01/14/2016 1200   GLUCOSE 106* 01/14/2016 1200   BUN 14 01/14/2016 1200   CREATININE 0.94 01/14/2016 1200   CALCIUM 8.8* 01/14/2016 1200   PROT 6.5 01/14/2016 1200   ALBUMIN 2.9* 01/14/2016 1200   AST 44* 01/14/2016 1200   ALT 19 01/14/2016 1200   ALKPHOS 115 01/14/2016 1200   BILITOT 1.5* 01/14/2016 1200   GFRNONAA >60 01/14/2016 1200   GFRAA >60 01/14/2016 1200       Component Value Date/Time   WBC 5.7 01/14/2016 1200   WBC 5.5 12/14/2015 1813   WBC 6.2 12/12/2015 1251   HGB 13.2 01/14/2016 1200   HGB 12.2* 12/14/2015 1813   HGB 11.5* 12/12/2015 1251   HCT 37.7* 01/14/2016 1200   HCT 35.0* 12/14/2015 1813   HCT 32.4* 12/12/2015 1251   MCV 97.7 01/14/2016 1200   MCV 97.8 12/14/2015 1813   MCV 97.0 12/12/2015 1251    Lipid Panel     Component Value Date/Time   CHOL 113 01/03/2015 0631   TRIG 104 01/03/2015 0631   HDL 37* 01/03/2015 0631   CHOLHDL 3.1 01/03/2015 0631   VLDL 21 01/03/2015 0631   LDLCALC 55 01/03/2015 0631    ABG    Component Value Date/Time   PHART 7.512* 10/19/2015 0750   PCO2ART 36.9 10/19/2015 0750   PO2ART 94.6 10/19/2015 0750   HCO3 30.2* 10/19/2015 0750   O2SAT 97.7 10/19/2015 0750     Lab Results  Component Value Date   TSH 3.085 10/17/2015   BNP  (last 3 results)  Recent Labs  12/14/15 1813 12/27/15 1039 01/14/16 1200  BNP 239.0* 254.0* 256.0*    ProBNP (last 3 results) No results for input(s): PROBNP in the last 8760 hours.  Cardiac Panel (last  3 results) No results for input(s): CKTOTAL, CKMB, TROPONINI, RELINDX in the last 72 hours.  Iron/TIBC/Ferritin/ %Sat    Component Value Date/Time   IRON 67 04/27/2015 1649   TIBC 263 04/27/2015 1649   FERRITIN 298 09/19/2015 1044   IRONPCTSAT 25 04/27/2015 1649     EKG Orders placed or performed during the hospital encounter of 01/14/16  . EKG     Prior Assessment and Plan Problem List as of 01/30/2016      Cardiovascular and Mediastinum   Varicose veins of lower extremities with other complications   New onset atrial fibrillation [facility])   Last Assessment & Plan 01/17/2015 Office Visit Edited 01/17/2015  2:50 PM by Lendon Colonel, NP    He is not on any AV nodal blocking agents as his heart rate is well controlled. He does not have any melena or over bleeding but has noticed that his stool has become darker. I will have a hemoccult of the stool completed for evidence of blood. Eliquis has low risk for GI bleeding but with changes in stool color, will check   I will also have him scheduled for sleep study to evaluate for OSA. He has the body habitus for this. This can be etiology of atrial fib as he was ruled out for ischemia y NM stress test on 01/11/2015. He will be referred to Dr. Luan Pulling once test is completed, if positive,  for further management      Acute diastolic CHF (congestive heart failure) ([place])   DVT (deep venous thrombosis) ([facility])   Atrial fibrillation [facility])     Digestive   Melena   Esophageal dysphagia   Dysphagia, pharyngoesophageal phase   Colon carcinoma [facility])   Last Assessment & Plan 09/20/2015 Office Visit Written 09/20/2015  9:52 AM by [name], MD    S/P CHEMO/NO XRT. INCOMPLETE TCS INMAR 2016.  COMPLETE TCS OCT 3-SUPREP SAMPLE GIVEN.  DISCUSSED PROCEDURE, BENEFITS, & RISKS: < 1% chance of medication reaction, bleeding, perforation, or rupture of spleen/liver.      Constipation   Last Assessment & Plan 09/20/2015 Office Visit Edited 09/20/2015  2:10 PM by [name], MD    SYMPTOMS NOT CONTROLLED AND LIKELY DUE TO DECREASED PO INTAKE, LESS LIKELY RECURRENT RECTAL CANCER, OR ANASTOMOTIC STRICTURE.  DRINK WATER TO KEEP YOUR URINE LIGHT YELLOW. FOLLOW A LOW FAT DIET. MEATS SHOULD BE BAKED, BROILED, OR BOILED. AVOID FRIED FOODS. SEE INFO BELOW. USE FIBER POWDER 1 PACKET ONCE DAILY FOR 3 DAYS THEN TWICE DAILY FOR 3 DAYS THEN THREE TIMES A DAY. TAKE SUPREP OCT 3 AND FOLLOW A CLEAR LIQUID DIET. YOU MAY HAVE CREAMY SOUPS WITHOUT CHUNKS OF FOOD, SCRAMBLED EGGS OR MASHED POTATOES IF YOU GET HUNGRY. WILL DISCUSS HOW LONG TO HOLD ELIQUIS WITH CARDIOLOGY-24 OR 48 HRS. COMPLETE COLONOSCOPY OCT 3. RESCHEDULE  MRI.  FOLLOW UP IN 6 MOS.          Colon cancer ([place])   Malignant neoplasm of colon ([place])     Nervous and Auditory   Spondylosis, cervical, with myelopathy     Other   Morbid obesity [facility])   Last Assessment & Plan 01/17/2015 Office Visit Written 01/17/2015  2:51 PM by Lendon Colonel, NP    If he indeed does have OSA, wearing CPAP at night may help him to increase his energy, therefore increasing his activity. I would like to see him on an exercise program, to increase his stamina and lose weight.  Heme positive stool   Last Assessment & Plan 03/15/2015 Office Visit Written 03/15/2015  1:11 PM by [name], PA-C    70 y/o male with h/o "black" stool with dark red blood several weeks ago while on Eliquis. Subsequently has returned multiple positive hemoccults. Eliquis held pending GI work up. He takes ibuprofen regularly making PUD a possibility. Recommend EGD+/-ED in the near future. He has some vague esophageal dysphagia.  I have discussed the risks, alternatives, benefits with regards to but not limited to the  risk of reaction to medication, bleeding, infection, perforation and the patient is agreeable to proceed. Written consent to be obtained.  Start omeprazole 20mg  daily empirically for PUD. Can stop if EGD unremarkable.      Diarrhea   Last Assessment & Plan 03/15/2015 Office Visit Written 03/15/2015  1:12 PM by [name], PA-C    Several week h/o diarrhea appears to have occurred since his hospitalization. Will check for CDiff. Plan for first ever colonoscopy for h/o heme + stool and FH of colon cancer.  I have discussed the risks, alternatives, benefits with regards to but not limited to the risk of reaction to medication, bleeding, infection, perforation and the patient is agreeable to proceed. Written consent to be obtained.        Occult blood in stools   Family history of colon cancer   Family history of breast cancer in mother   Cellulitis   Elevated liver enzymes   Last Assessment & Plan 09/20/2015 Office Visit Written 09/20/2015  9:54 AM by [name], MD    NEW FINDING ON EVALUATION-RECENT ECHO/CT ABD: FATTY LIVER, NL RA/RV, MILDLY ELEVATED PA PRESSURE.  COMPLETE SEROLOGIES FOR AIH/PBC.      Change in bowel habits   Peripheral edema   Generalized weakness   Abnormal LFTs   Fecal incontinence   Altered mental status       Imaging: No results found.

## 2016-01-31 ENCOUNTER — Ambulatory Visit (HOSPITAL_COMMUNITY): Payer: Medicare Other | Attending: Pulmonary Disease | Admitting: Physical Therapy

## 2016-01-31 DIAGNOSIS — R269 Unspecified abnormalities of gait and mobility: Secondary | ICD-10-CM | POA: Diagnosis not present

## 2016-01-31 DIAGNOSIS — R29898 Other symptoms and signs involving the musculoskeletal system: Secondary | ICD-10-CM | POA: Diagnosis present

## 2016-01-31 DIAGNOSIS — R6889 Other general symptoms and signs: Secondary | ICD-10-CM | POA: Diagnosis present

## 2016-01-31 DIAGNOSIS — R296 Repeated falls: Secondary | ICD-10-CM | POA: Diagnosis present

## 2016-01-31 DIAGNOSIS — R262 Difficulty in walking, not elsewhere classified: Secondary | ICD-10-CM | POA: Insufficient documentation

## 2016-01-31 NOTE — Therapy (Signed)
[place], [place], [address] Phone: [phone]   Fax:  [phone]  Physical Therapy Treatment  Patient Details  Name: [name] MRN: [id number] Date of Birth: July 03, 1946 Referring Provider: Luan Pulling   Encounter Date: 01/31/2016      PT End of Session - 01/31/16 1533    Visit Number 7   Number of Visits 18   Date for PT Re-Evaluation 02/07/16   Authorization Type Medicare   Authorization - Visit Number 7   Authorization - Number of Visits 10   PT Start Time 1300   PT Stop Time 1345   PT Time Calculation (min) 45 min   Equipment Utilized During Treatment Gait belt   Activity Tolerance Patient tolerated treatment well;No increased pain   Behavior During Therapy [facility] for tasks assessed/performed      Past Medical History  Diagnosis Date  . Varicose veins   . New onset atrial fibrillation ([place]) 01/02/2015  . Morbid obesity ([place]) 01/02/2015  . Hypertension   . Sleep apnea     been tested but has not received the CPAP yet  . GERD (gastroesophageal reflux disease)   . History of gout   . Cancer ([place]) 02/2015    colon  . Family history of colon cancer   . Family history of breast cancer in mother     Past Surgical History  Procedure Laterality Date  . Vein ligation and stripping      left leg  . Colonoscopy N/A 03/28/2015    SLF: 1. Abdominal pain, diarrhea, rectal bleeding due to obstructing colon mass  . Esophagogastroduodenoscopy N/A 03/28/2015    SLF: 1. dysphagia 2. Mild non-erosive gastritis.   . Esophageal dilation N/A 03/28/2015    Procedure: ESOPHAGEAL DILATION;  Surgeon: [name], MD;  [facility];  Service: Endoscopy;  Laterality: N/A;  . Partial colectomy N/A 04/03/2015    Procedure: PARTIAL COLECTOMY;  Surgeon: [name], MD;  Location: [facility] ORS;  Service: General;  Laterality: N/A;  . Portacath placement N/A 05/10/2015    Procedure: INSERTION PORT-A-CATH;  Surgeon:  [name], MD;  Location: [facility] ORS;  Service: General;  Laterality: N/A;  left subclavian  . Skin cancer destruction Left 08/16/15    left side of nose and left back  . Colonoscopy N/A 10/03/2015    SLF: normal anastomosis, fair prep (polyps less than 1cm could be missed), anal fissures and verrucous anal canal lesion with benign biopsy. Next TCS in 09/2016.  . Colonoscopy N/A 10/02/2015    SLF: normal anastomosis, poor bowel prep, three anal fissures    There were no vitals filed for this visit.  Visit Diagnosis:  Abnormality of gait  Difficulty walking  Leg weakness, bilateral  Falls frequently  Decreased functional activity tolerance      Subjective Assessment - 01/31/16 1310    Subjective Pt states his pain is the same in his Rt knee, 7/10.  Reports no other problems.   Currently in Pain? Yes   Pain Score 7    Pain Location Knee   Pain Orientation Right   Pain Descriptors / Indicators Aching   Pain Type Chronic pain                         [facility] Adult PT Treatment/Exercise - 01/31/16 1311    Knee/Hip Exercises: Standing   Heel Raises 20 reps;3 seconds   Heel Raises Limitations  heel and toe raises   Hip Flexion 15 reps   Hip Flexion Limitations Toe tapping 6in step   Hip Abduction 20 reps   Abduction Limitations 2" holds   Hip Extension 20 reps   Extension Limitations 2" holds   Functional Squat 15 reps   Functional Squat Limitations 1 UE   Rocker Board 2 minutes   Rocker Board Limitations R/L and A/P with HHA   SLS 30" with 1 HHA; Lt 6" Rt 6"   Gait Training 226' with SPC in 2:41   Knee/Hip Exercises: Seated   Sit to Sand 10 reps;without UE support                  PT Short Term Goals - 01/10/16 1152    PT SHORT TERM GOAL #1   Title Pt to be I in Hep in order to obtain goals in a timely manner.    Time 3   Period Weeks   Status New   PT SHORT TERM GOAL #2   Title Patient will be able to ambulate 34minutes without resting in  order to progress to better health habits.    Time 3   Period Weeks   Status New   PT SHORT TERM GOAL #3   Title Pt to be able to stand for 10 minutes to be able to complete self grooming activity without fear of falling    Time 3   Period Weeks           PT Long Term Goals - 01/10/16 1154    PT LONG TERM GOAL #1   Title Pt to be I in advance HEP in order to decrease frequency of falling    Time 6   Period Weeks   Status New   PT LONG TERM GOAL #2   Title Pt to have not fallen in the past two weeks for reduced risk of injury    Time 6   Period Weeks   Status New   PT LONG TERM GOAL #3   Title Pt to be able to walk for 15 minutes without stopping for better health habits   Time 6   Period Weeks   Status New   PT LONG TERM GOAL #4   Title Pt to be able to stand for 15 minutes to be safe in shower    Time 6   Period Weeks   PT LONG TERM GOAL #5   Title Pt strength to be increased by one grade to be able to walk in his home with a cane    Time 6   Period Weeks   Status New               Plan - 01/31/16 1533    Clinical Impression Statement Focused today on functional mobilty, strengthening and gait with SPC.  Pt able to complete 1 RT in clinic (226 feet) and reported fatigue needing to rest.  PT required cues to increase step length with Lt LE and increase stance time on Rt LE.  Otherwise, good stability noted just with fatigue. PT with continued knee pain having to take frequent breaks.  Unable to complete balance actvities today due to time.    PT Next Visit Plan Continue with current PT POC for strengthening, balance and standing tolerance.  Resume balance activities and continue gait wtih SPC.        Problem List Patient Active Problem List   Diagnosis Date Noted  . Spondylosis,  cervical, with myelopathy 10/24/2015  . Altered mental status   . Abnormal LFTs   . Fecal incontinence   . Peripheral edema 10/15/2015  . Acute diastolic CHF (congestive heart  failure) ([place]) 10/15/2015  . Generalized weakness 10/15/2015  . DVT (deep venous thrombosis) ([place]) 10/15/2015  . Atrial fibrillation ([place]) 10/15/2015  . Change in bowel habits   . Malignant neoplasm of colon ([place])   . Colon cancer ([place])   . Constipation 09/20/2015  . Elevated liver enzymes 09/20/2015  . Cellulitis 08/04/2015  . Family history of colon cancer   . Family history of breast cancer in mother   . Colon carcinoma ([place]) 04/03/2015  . Occult blood in stools   . Dysphagia, pharyngoesophageal phase   . Melena 03/15/2015  . Heme positive stool 03/15/2015  . Esophageal dysphagia 03/15/2015  . Diarrhea 03/15/2015  . New onset atrial fibrillation ([place]) 01/02/2015  . Morbid obesity ([place]) 01/02/2015  . Varicose veins of lower extremities with other complications [id number]    Teena Irani, PTA/CLT [phone]  01/31/2016, 3:42 PM  [facility] [address] [place], [place], [address] Phone: [phone]   Fax:  [phone]  Name: [name] MRN: [id number] Date of Birth: [birth year]

## 2016-02-02 ENCOUNTER — Other Ambulatory Visit: Payer: Self-pay | Admitting: *Deleted

## 2016-02-02 ENCOUNTER — Encounter (HOSPITAL_COMMUNITY): Payer: Medicare Other | Admitting: Physical Therapy

## 2016-02-02 NOTE — Patient Outreach (Signed)
F/u phone call: Spoke with spouse who reports pt is doing well at outpatient therapy, going three times a week, has 3 more weeks left.  Spouse reports pt has been gaining fluid,going back and forth to MD every week, edema in legs not going down.  Spouse reports pt f/u with Bunnie Domino NP on 2/1, told pt Metolazone was last medicine she would put him on.   Spouse reports pt f/u with Dr. Luan Pulling 2/2, MD stopped Lasix and To[place]mide- called in rx for Metolazone (take one a day for 3 days).   Spouse states she has a call out to [employer], wants to make sure it is okay for pt to take the medication.   As discussed, plan to f/u with pt 2/7- home visit.      Zara Chess.   [place] [facility]  [phone]

## 2016-02-05 ENCOUNTER — Ambulatory Visit (HOSPITAL_COMMUNITY): Payer: Medicare Other

## 2016-02-05 ENCOUNTER — Encounter (HOSPITAL_COMMUNITY): Payer: Self-pay

## 2016-02-05 DIAGNOSIS — R29898 Other symptoms and signs involving the musculoskeletal system: Secondary | ICD-10-CM

## 2016-02-05 DIAGNOSIS — R6889 Other general symptoms and signs: Secondary | ICD-10-CM

## 2016-02-05 DIAGNOSIS — R269 Unspecified abnormalities of gait and mobility: Secondary | ICD-10-CM | POA: Diagnosis not present

## 2016-02-05 DIAGNOSIS — R296 Repeated falls: Secondary | ICD-10-CM

## 2016-02-05 DIAGNOSIS — R262 Difficulty in walking, not elsewhere classified: Secondary | ICD-10-CM

## 2016-02-05 NOTE — Therapy (Signed)
[place], [place], [address] Phone: [phone]   Fax:  [phone]  Physical Therapy Treatment  Patient Details  Name: [name] MRN: [id number] Date of Birth: August 08, 1946 Referring Provider: Luan Pulling   Encounter Date: 02/05/2016      PT End of Session - 02/05/16 1109    Visit Number 8   Number of Visits 18   Date for PT Re-Evaluation 02/07/16   Authorization Type Medicare   Authorization - Visit Number 8   Authorization - Number of Visits 10   PT Start Time 1102   PT Stop Time 1147   PT Time Calculation (min) 45 min   Equipment Utilized During Treatment Gait belt   Activity Tolerance Patient tolerated treatment well   Behavior During Therapy [facility] for tasks assessed/performed      Past Medical History  Diagnosis Date  . Varicose veins   . New onset atrial fibrillation ([place]) 01/02/2015  . Morbid obesity ([place]) 01/02/2015  . Hypertension   . Sleep apnea     been tested but has not received the CPAP yet  . GERD (gastroesophageal reflux disease)   . History of gout   . Cancer ([place]) 02/2015    colon  . Family history of colon cancer   . Family history of breast cancer in mother     Past Surgical History  Procedure Laterality Date  . Vein ligation and stripping      left leg  . Colonoscopy N/A 03/28/2015    SLF: 1. Abdominal pain, diarrhea, rectal bleeding due to obstructing colon mass  . Esophagogastroduodenoscopy N/A 03/28/2015    SLF: 1. dysphagia 2. Mild non-erosive gastritis.   . Esophageal dilation N/A 03/28/2015    Procedure: ESOPHAGEAL DILATION;  Surgeon: [name], MD;  [facility];  Service: Endoscopy;  Laterality: N/A;  . Partial colectomy N/A 04/03/2015    Procedure: PARTIAL COLECTOMY;  Surgeon: [name], MD;  Location: [facility] ORS;  Service: General;  Laterality: N/A;  . Portacath placement N/A 05/10/2015    Procedure: INSERTION PORT-A-CATH;  Surgeon: [name],  MD;  Location: [facility] ORS;  Service: General;  Laterality: N/A;  left subclavian  . Skin cancer destruction Left 08/16/15    left side of nose and left back  . Colonoscopy N/A 10/03/2015    SLF: normal anastomosis, fair prep (polyps less than 1cm could be missed), anal fissures and verrucous anal canal lesion with benign biopsy. Next TCS in 09/2016.  . Colonoscopy N/A 10/02/2015    SLF: normal anastomosis, poor bowel prep, three anal fissures    There were no vitals filed for this visit.  Visit Diagnosis:  Abnormality of gait  Difficulty walking  Leg weakness, bilateral  Falls frequently  Decreased functional activity tolerance      Subjective Assessment - 02/05/16 1107    Subjective Pt reported continued complaints of R knee pain that was rated a 6/10 on a VAS. Pt noted that his balance has improved since begining with PT, but continues to be unsteady, Pt denied falls since his last PT visit.    Patient is accompained by: Family member   Pertinent History Patient had cancer surgery in april 2016 to removed a18inches of colon, mD beleives they removed all of colon, Patient began Chemo 05/17/15. patient had surgery 20 years ago to "strip veins" and has sicne had swelling in both legs. patint [place] shooting pool, patient states limited physical activity due  to bad knee and limited energy levels noting "I can t walk across the road."  "I feel worn out to where i can sit up for a while then have to sit back down and rest.  little pain with sit to stance.    Limitations Walking;Standing   Patient Stated Goals to have have more energy, to be able  to walk further; to get rid of the walker; reduce pain levels of the R knee    Currently in Pain? Yes   Pain Score 6    Pain Location Knee   Pain Orientation Right   Pain Descriptors / Indicators Aching   Pain Type Chronic pain   Pain Onset More than a month ago   Pain Frequency Intermittent   Aggravating Factors  WB activities    Pain Relieving  Factors rest and sitting    Effect of Pain on Daily Activities difficulty with long distance ambulation and standing ADLs    Multiple Pain Sites No                         [facility] Adult PT Treatment/Exercise - 02/05/16 0001    Knee/Hip Exercises: Standing   Heel Raises 20 reps;3 seconds   Heel Raises Limitations heel and toe raises   Hip Flexion 15 reps;Stengthening;Other (comment)  with UE support on FWW with close supervision    Hip Abduction Stengthening;Both;2 sets;10 reps;Other (comment)  with UE support on FWW and close supervision    Abduction Limitations 2" holds   Knee/Hip Exercises: Seated   Sit to Sand 10 reps;without UE support;Other (comment)  with focus on eccentric control                     Balance Exercises - 02/05/16 1156    Balance Exercises: Standing   Standing Eyes Opened Narrow base of support (BOS);Foam/compliant surface;4 reps;Other (comment)  1 minute, completed bilaterally with close supervision    Standing Eyes Closed Wide (BOA);4 reps;Other (comment)  1 minute, completed bilaterally with close supervision    Tandem Stance Eyes open;4 reps;Other (comment)  1 minute, completed bilaterally with close supervision    Sidestepping Upper extremity support;5 reps;Other (comment)  between parallel bars    Step Over Hurdles / Cones x 8 sets over 3 hurdles with unilateral UE support   with close supervision            PT Education - 02/05/16 1118    Education Details Educated pt on 5/5 fall precautions and current HEP   Person(s) Educated Patient   Methods Explanation   Comprehension Verbalized understanding;Returned demonstration          PT Short Term Goals - 01/10/16 1152    PT SHORT TERM GOAL #1   Title Pt to be I in Hep in order to obtain goals in a timely manner.    Time 3   Period Weeks   Status New   PT SHORT TERM GOAL #2   Title Patient will be able to ambulate 81minutes without resting in order to progress to  better health habits.    Time 3   Period Weeks   Status New   PT SHORT TERM GOAL #3   Title Pt to be able to stand for 10 minutes to be able to complete self grooming activity without fear of falling    Time 3   Period Weeks           PT  Long Term Goals - 01/10/16 1154    PT LONG TERM GOAL #1   Title Pt to be I in advance HEP in order to decrease frequency of falling    Time 6   Period Weeks   Status New   PT LONG TERM GOAL #2   Title Pt to have not fallen in the past two weeks for reduced risk of injury    Time 6   Period Weeks   Status New   PT LONG TERM GOAL #3   Title Pt to be able to walk for 15 minutes without stopping for better health habits   Time 6   Period Weeks   Status New   PT LONG TERM GOAL #4   Title Pt to be able to stand for 15 minutes to be safe in shower    Time 6   Period Weeks   PT LONG TERM GOAL #5   Title Pt strength to be increased by one grade to be able to walk in his home with a cane    Time 6   Period Weeks   Status New               Plan - 02/05/16 1110    Clinical Impression Statement PT tx was focused on functional LE strengthening and standing static/dynamic balance training activities as documented. Pt required occasional seated rest breaks during PT tx secondary to LE fatigue with prolonged standing balance activities. Pt presented with mulit-directional sway with EC with static standing balance activity on airex pad with LOB assessed in a posterior and anterior direction with min assist required to regain balance on a few occasions. Improved balance once BOS was widened. Provided tactile cues to improve postural awareness with improved balance assessed with subsequent trials. Improved weight shifting assessed with hurdle step-overs, but pt requires at least one UE support on hand rail to maintain balance. R knee pain remained at a 6/10 on a VAS at the completion of PT tx. Pt is responding well to current PT tx and is making steady  progress towards stated PT goals.  Pt would benefit from continued skilled PT to address LE strength deficits and balance deficits.    Pt will benefit from skilled therapeutic intervention in order to improve on the following deficits Abnormal gait;Decreased activity tolerance;Decreased balance;Decreased endurance;Decreased strength;Difficulty walking;Pain;Postural dysfunction   Rehab Potential Good   PT Frequency 3x / week   PT Treatment/Interventions Gait training;Functional mobility training;Therapeutic activities;Therapeutic exercise;Balance training;Patient/family education;Neuromuscular re-education   PT Next Visit Plan Continue with current PT POC for strengthening, balance, and standing tolerance.  Complete 30-day/10-day progress note at next visit.    PT Home Exercise Plan Reviewed HEP   Consulted and Agree with Plan of Care Patient        Problem List Patient Active Problem List   Diagnosis Date Noted  . 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 Altered mental status   . Abnormal LFTs   . Fecal incontinence   . Peripheral edema 10/15/2015  . Acute diastolic CHF (congestive heart failure) ([place]) 10/15/2015  . Generalized weakness 10/15/2015  . DVT (deep venous thrombosis) ([place]) 10/15/2015  . Atrial fibrillation ([place]) 10/15/2015  . Change in bowel habits   . Malignant neoplasm of colon ([place])   . Colon cancer ([place])   . Constipation 09/20/2015  . Elevated liver enzymes 09/20/2015  . Cellulitis 08/04/2015  . Family history of colon cancer   . Family history of breast cancer in  mother   . Colon carcinoma ([place]) 04/03/2015  . Occult blood in stools   . Dysphagia, pharyngoesophageal phase   . Melena 03/15/2015  . Heme positive stool 03/15/2015  . Esophageal dysphagia 03/15/2015  . Diarrhea 03/15/2015  . New onset atrial fibrillation ([place]) 01/02/2015  . Morbid obesity ([place]) 01/02/2015  . Varicose veins of lower extremities with other complications [id number]     Garen Lah, PT, DPT  02/05/2016, 12:02 PM  [facility] [address] [place], [place], [address] Phone: [phone]   Fax:  [phone]  Name: [name] MRN: [id number] Date of Birth: [birth year]

## 2016-02-06 ENCOUNTER — Ambulatory Visit (INDEPENDENT_AMBULATORY_CARE_PROVIDER_SITE_OTHER): Payer: Medicare Other | Admitting: Gastroenterology

## 2016-02-06 ENCOUNTER — Other Ambulatory Visit: Payer: Self-pay | Admitting: *Deleted

## 2016-02-06 ENCOUNTER — Other Ambulatory Visit: Payer: Self-pay

## 2016-02-06 ENCOUNTER — Encounter: Payer: Self-pay | Admitting: Gastroenterology

## 2016-02-06 ENCOUNTER — Ambulatory Visit: Payer: Self-pay | Admitting: *Deleted

## 2016-02-06 ENCOUNTER — Encounter (HOSPITAL_COMMUNITY): Payer: Medicare Other | Attending: Hematology & Oncology

## 2016-02-06 VITALS — BP 93/62 | HR 68 | Temp 98.1°F | Ht 67.0 in | Wt 260.4 lb

## 2016-02-06 VITALS — BP 97/53 | HR 66 | Temp 97.4°F | Resp 18

## 2016-02-06 DIAGNOSIS — C19 Malignant neoplasm of rectosigmoid junction: Secondary | ICD-10-CM | POA: Diagnosis not present

## 2016-02-06 DIAGNOSIS — Z95828 Presence of other vascular implants and grafts: Secondary | ICD-10-CM

## 2016-02-06 DIAGNOSIS — K760 Fatty (change of) liver, not elsewhere classified: Secondary | ICD-10-CM

## 2016-02-06 DIAGNOSIS — R195 Other fecal abnormalities: Secondary | ICD-10-CM | POA: Insufficient documentation

## 2016-02-06 DIAGNOSIS — C187 Malignant neoplasm of sigmoid colon: Secondary | ICD-10-CM | POA: Insufficient documentation

## 2016-02-06 DIAGNOSIS — C189 Malignant neoplasm of colon, unspecified: Secondary | ICD-10-CM

## 2016-02-06 DIAGNOSIS — Z452 Encounter for adjustment and management of vascular access device: Secondary | ICD-10-CM

## 2016-02-06 MED ORDER — SODIUM CHLORIDE 0.9% FLUSH
10.0000 mL | Freq: Once | INTRAVENOUS | Status: AC
  Administered 2016-02-06: 10 mL via INTRAVENOUS

## 2016-02-06 MED ORDER — HEPARIN SOD (PORK) LOCK FLUSH 100 UNIT/ML IV SOLN
500.0000 [IU] | Freq: Once | INTRAVENOUS | Status: AC
  Administered 2016-02-06: 500 [IU] via INTRAVENOUS
  Filled 2016-02-06: qty 5

## 2016-02-06 NOTE — Patient Outreach (Signed)
Received a call from pt's spouse, cancel pt's appointment today with RN CM, appointment to cancer center changed.   Spouse reports pt still retaining fluid in legs/right foot shiny- gained 12 lbs in 3 weeks, reason for pt going to ED (10 lb weight gain in short time).  Spouse reports pt's urine output has been down for past 3 weeks, third week following up with Dr. Luan Pulling - to see 2/9.  RN CM reinforced with spouse to call MD prior to office visit if needed (weight gain of 3 lbs in a day, 5 lbs in a week) to which she said she would.  Spouse reports concern over pt's memory, one day did not recognize grandson, memory not  the same since hospitalization.   Spouse also reports pt's speech is not like it was.  RN CM encouraged spouse to discuss this with Dr. Luan Pulling at upcoming appointment.   As discussed with spouse, plan to f/u with pt 2/14- home visit.    Zara Chess.   [facility]  [phone]

## 2016-02-06 NOTE — Progress Notes (Signed)
[name] presented for Portacath access and flush. Proper placement of portacath confirmed by CXR. Portacath located left chest wall accessed with  H 20 needle. Good blood return present. Portacath flushed with 19ml NS and 500U/22ml Heparin and needle removed intact. Procedure without incident. Patient tolerated procedure well.

## 2016-02-06 NOTE — Patient Instructions (Signed)
I have ordered a special ultrasound to get a baseline of your liver.   We will see you in 6 months.  Your next colonoscopy is in October 2017!

## 2016-02-06 NOTE — Progress Notes (Signed)
Referring Provider: [name], MD Primary Care Physician:  [name], MD  Primary GI: Dr. Oneida Alar   Chief Complaint  Patient presents with  . Follow-up    HPI:   [name] is a 70 y.o. male presenting today with a history of stage IIA colon cancer, s/p colectomy April 2016 by Dr. Arnoldo Morale. He had a colonoscopy in Oct 2016 with normal anastomosis, anal fissures, verrucous anal canal lesion that was benign. He is due for surveillance colonoscopy Oct 2017.    No constipation.Taking  Metamucil tablets. Taking dicyclomine BID but no cramping or loose stool. Wife would like to keep this as it is. Inpatient Oct 2016 with heart failure, right DVT, mental status changes, loose stool. Elevated LFTs in setting of fatty liver. ASMA weakly positive, viral markers negative. Thought to be elevated in setting of fatty liver. Most recent LFTs with AST 44, Tbili 1.5.   Past Medical History  Diagnosis Date  . Varicose veins   . New onset atrial fibrillation ([place]) 01/02/2015  . Morbid obesity ([place]) 01/02/2015  . Hypertension   . Sleep apnea     been tested but has not received the CPAP yet  . GERD (gastroesophageal reflux disease)   . History of gout   . Cancer ([place]) 02/2015    colon  . Family history of colon cancer   . Family history of breast cancer in mother   . Colon cancer [facility])     March 2016    Past Surgical History  Procedure Laterality Date  . Vein ligation and stripping      left leg  . Colonoscopy N/A 03/28/2015    SLF: 1. Abdominal pain, diarrhea, rectal bleeding due to obstructing colon mass  . Esophagogastroduodenoscopy N/A 03/28/2015    SLF: 1. dysphagia 2. Mild non-erosive gastritis.   . Esophageal dilation N/A 03/28/2015    Procedure: ESOPHAGEAL DILATION;  Surgeon: [name], MD;  Location: [facility] ENDO [facility];  Service: Endoscopy;  Laterality: N/A;  . Partial colectomy N/A 04/03/2015    Procedure: PARTIAL COLECTOMY;  Surgeon: [name], MD;  Location:  [facility] ORS;  Service: General;  Laterality: N/A;  . Portacath placement N/A 05/10/2015    Procedure: INSERTION PORT-A-CATH;  Surgeon: [name], MD;  Location: [facility] ORS;  Service: General;  Laterality: N/A;  left subclavian  . Skin cancer destruction Left 08/16/15    left side of nose and left back  . Colonoscopy N/A 10/03/2015    SLF: normal anastomosis, fair prep (polyps less than 1cm could be missed), anal fissures and verrucous anal canal lesion with benign biopsy. Next TCS in 09/2016.  . Colonoscopy N/A 10/02/2015    SLF: normal anastomosis, poor bowel prep, three anal fissures    Current Outpatient Prescriptions  Medication Sig Dispense Refill  . albuterol (PROVENTIL HFA;VENTOLIN HFA) 108 (90 Base) MCG/ACT inhaler Inhale 1-2 puffs into the lungs every 6 (six) hours as needed for wheezing or shortness of breath.    . dicyclomine (BENTYL) 10 MG capsule 1 po 30 MINS BEFORE BREAKFAST AND MAY REPEAT BEFORE LUNCH. (Patient taking differently: Take 10 mg by mouth 2 (two) times daily. 1 po 30 MINS BEFORE BREAKFAST AND MAY REPEAT BEFORE LUNCH.) 60 capsule 11  . ELIQUIS 5 MG TABS tablet TAKE 1 TABLET BY MOUTH TWICE A DAY  12  . escitalopram (LEXAPRO) 20 MG tablet Take 1 tablet (20 mg total) by mouth daily. 30 tablet 5  . levETIRAcetam (KEPPRA) 500 MG  tablet Take 1 tablet (500 mg total) by mouth 2 (two) times daily. 60 tablet 12  . lidocaine-prilocaine (EMLA) cream Apply a quarter size amount to port site 1 hour prior to chemo. Do not rub in. Cover with plastic wrap. 30 g 3  . lisinopril (PRINIVIL,ZESTRIL) 10 MG tablet Take 1 tablet (10 mg total) by mouth daily. 30 tablet 12  . nitroGLYCERIN (NITROGLYN) 2 % ointment Apply 0.5 inches topically as needed for chest pain. For rectal area    . omeprazole (PRILOSEC) 20 MG capsule Take 1 capsule (20 mg total) by mouth daily. 30 capsule 3  . potassium chloride SA (K-DUR,KLOR-CON) 20 MEQ tablet Take 1 tablet (20 mEq total) by mouth daily. (Patient taking  differently: Take 20 mEq by mouth 2 (two) times daily. ) 90 tablet 3  . psyllium (METAMUCIL) 58.6 % packet Take 1 packet by mouth daily.    [name] torsemide (DEMADEX) 20 MG tablet TK 2 TS PO BID  5   No current facility-administered medications for this visit.    Allergies as of 02/06/2016  . (No Known Allergies)    Family History  Problem Relation Age of Onset  . Breast cancer Mother     dx under 42  . Colon cancer Mother     dx in her mid 49s  . Heart disease Father   . Hyperlipidemia Father   . Liver cancer Father     heavy ETOH user when young  . Cancer Sister     slow "blood" cancer  . Diabetes Sister   . Heart disease Sister   . Diabetes Brother   . Cancer Maternal Aunt     2-3 maternal aunts with Cancer NOS  . Lung cancer Paternal Uncle     three uncles with lung cancer - all smokers  . Cancer Paternal Grandmother     possible bone cancer    Social History   Social History  . Marital Status: Married    Spouse Name: [name]  . Number of Children: 2  . Years of Education: N/A   Social History Main Topics  . Smoking status: Never Smoker   . Smokeless tobacco: Never Used  . Alcohol Use: No  . Drug Use: No  . Sexual Activity: No   Other Topics Concern  . None   Social History Narrative    Review of Systems: As mentioned in HPI.   Physical Exam: BP 93/62 mmHg  Pulse 68  Temp(Src) 98.1 F (36.7 C) (Oral)  Ht 5\' 7"  (1.702 m)  Wt 260 lb 6.4 oz (118.117 kg)  BMI 40.77 kg/m2 General:   Alert and oriented. No distress noted. Pleasant and cooperative.  Head:  Normocephalic and atraumatic. Eyes:  Conjuctiva clear without scleral icterus. Abdomen:  +BS, soft, non-tender and non-distended. No rebound or guarding. Obese.  Msk:  Using walker but ambulates without assistance to table.  Extremities:  Without edema. Neurologic:  Alert and  oriented x4;  grossly normal neurologically. Psych:  Alert and cooperative. Normal mood and affect.  Lab Results  Component  Value Date   ALT 19 01/14/2016   AST 44* 01/14/2016   ALKPHOS 115 01/14/2016   BILITOT 1.5* 01/14/2016

## 2016-02-06 NOTE — Patient Instructions (Signed)
[facility] Discharge Instructions  RECOMMENDATIONS MADE BY THE CONSULTANT AND ANY TEST RESULTS WILL BE SENT TO YOUR REFERRING PHYSICIAN.  Port flush today as scheduled. Return as scheduled.  Thank you for choosing [facility] at [facility] to provide your oncology and hematology care.  To afford each patient quality time with our provider, please arrive at least 15 minutes before your scheduled appointment time.   Beginning January 23rd 2017 lab work for the Cancer Center will be done in the  Main lab at [facility] on 1st floor. If you have a lab appointment with the Cancer Center please come in thru the  Main Entrance and check in at the main information desk  You need to re-schedule your appointment should you arrive 10 or more minutes late.  We strive to give you quality time with our providers, and arriving late affects you and other patients whose appointments are after yours.  Also, if you no show three or more times for appointments you may be dismissed from the clinic at the providers discretion.     Again, thank you for choosing [facility].  Our hope is that these requests will decrease the amount of time that you wait before being seen by our physicians.       _____________________________________________________________  Should you have questions after your visit to [facility], please contact our office at [phone] between the hours of 8:30 a.m. and 4:30 p.m.  Voicemails left after 4:30 p.m. will not be returned until the following business day.  For prescription refill requests, have your pharmacy contact our office.    

## 2016-02-07 ENCOUNTER — Encounter (HOSPITAL_COMMUNITY): Payer: Medicare Other

## 2016-02-07 ENCOUNTER — Ambulatory Visit (HOSPITAL_COMMUNITY): Payer: Medicare Other

## 2016-02-07 ENCOUNTER — Encounter (HOSPITAL_COMMUNITY): Payer: Self-pay

## 2016-02-07 DIAGNOSIS — R29898 Other symptoms and signs involving the musculoskeletal system: Secondary | ICD-10-CM

## 2016-02-07 DIAGNOSIS — R6889 Other general symptoms and signs: Secondary | ICD-10-CM

## 2016-02-07 DIAGNOSIS — R269 Unspecified abnormalities of gait and mobility: Secondary | ICD-10-CM

## 2016-02-07 DIAGNOSIS — R262 Difficulty in walking, not elsewhere classified: Secondary | ICD-10-CM

## 2016-02-07 DIAGNOSIS — R296 Repeated falls: Secondary | ICD-10-CM

## 2016-02-07 NOTE — Patient Instructions (Signed)
ABDUCTION: Sitting - Resistance Band (Active)  ADDUCTION: Sitting - Resistance Band (Active)   Sit, right leg out to side as far as possible. Against yellow resistance band, draw leg inward. Complete 3 sets of 10 repetitions. Perform 1 session, every other day     Sit with feet flat. Lift right leg slightly and, against yellow resistance band, draw it out to side. Complete 3 sets of 10 repetitions. Perform 1 session, every other day   Copyright  VHI. All rights reserved.

## 2016-02-07 NOTE — Therapy (Signed)
[facility] [place], [place], [address] Phone: [phone]   Fax:  [phone]  Physical Therapy Treatment/ 30-day Progress Note  Patient Details  Name: [name] MRN: [id number] Date of Birth: [birth year] Referring Provider: Dr. [name]   Encounter Date: 03/04/2016      PT End of Session - 2016-03-04 1525    Visit Number 9   Number of Visits 18   Date for PT Re-Evaluation 03/08/16   Authorization Type Medicare   Authorization Time Period 01/10/2016 to 04-04-2016;  30-day PN and G-codes completed on March 04, 2016   Authorization - Visit Number 9   Authorization - Number of Visits 10   PT Start Time [id number]   PT Stop Time 1610   PT Time Calculation (min) 53 min   Equipment Utilized During Treatment Gait belt   Activity Tolerance Patient tolerated treatment well   Behavior During Therapy [facility] for tasks assessed/performed      Past Medical History  Diagnosis Date  . Varicose veins   . New onset atrial fibrillation ([place]) 01/02/2015  . Morbid obesity ([place]) 01/02/2015  . Hypertension   . Sleep apnea     been tested but has not received the CPAP yet  . GERD (gastroesophageal reflux disease)   . History of gout   . Cancer ([place]) 02/2015    colon  . Family history of colon cancer   . Family history of breast cancer in mother     Past Surgical History  Procedure Laterality Date  . Vein ligation and stripping      left leg  . Colonoscopy N/A 03/28/2015    SLF: 1. Abdominal pain, diarrhea, rectal bleeding due to obstructing colon mass  . Esophagogastroduodenoscopy N/A 03/28/2015    SLF: 1. dysphagia 2. Mild non-erosive gastritis.   . Esophageal dilation N/A 03/28/2015    Procedure: ESOPHAGEAL DILATION;  Surgeon: [name], MD;  Location[facility];  Service: Endoscopy;  Laterality: N/A;  . Partial colectomy N/A 04/03/2015    Procedure: PARTIAL COLECTOMY;  Surgeon: [name], MD;  Location: [facility] ORS;  Service:  General;  Laterality: N/A;  . Portacath placement N/A 05/10/2015    Procedure: INSERTION PORT-A-CATH;  Surgeon: [name], MD;  Location: [facility] ORS;  Service: General;  Laterality: N/A;  left subclavian  . Skin cancer destruction Left 08/16/15    left side of nose and left back  . Colonoscopy N/A 10/03/2015    SLF: normal anastomosis, fair prep (polyps less than 1cm could be missed), anal fissures and verrucous anal canal lesion with benign biopsy. Next TCS in 09/2016.  . Colonoscopy N/A 10/02/2015    SLF: normal anastomosis, poor bowel prep, three anal fissures    There were no vitals filed for this visit.  Visit Diagnosis:  Abnormality of gait  Difficulty walking  Leg weakness, bilateral  Falls frequently  Decreased functional activity tolerance      Subjective Assessment - 03-04-16 1523    Subjective Pt reported continued complaints of R knee pain that was rated a 5/10 on a VAS. Pt noted that his balance has improved since begining with PT. Pt further stated " I can lift my legs better than I was". Pt reported a 80% improvement since beginning with PT. Pt denied falls since last PT visit.    Patient is accompained by: Family member   Pertinent History Patient had cancer surgery in april 2016 to removed a18inches of colon, mD  beleives they removed all of colon, Patient began Chemo 05/17/15. patient had surgery 20 years ago to "strip veins" and has sicne had swelling in both legs. patint [place] shooting pool, patient states limited physical activity due to bad knee and limited energy levels noting "I can t walk across the road."  "I feel worn out to where i can sit up for a while then have to sit back down and rest.  little pain with sit to stance.    Limitations Walking;Standing   How long can you sit comfortably? 02/07/2016=no limitations   How long can you stand comfortably? 02/07/2016=10 minutes    How long can you walk comfortably? 02/07/2016=10 minutes    Patient Stated Goals to have  have more energy, to be able  to walk further; to get rid of the walker; reduce pain levels of the R knee    Currently in Pain? Yes   Pain Score 5   R knee pain ranges between 4-8/10 on a VAS   Pain Location Knee   Pain Orientation Right   Pain Descriptors / Indicators Aching   Pain Type Chronic pain   Pain Onset More than a month ago   Aggravating Factors  WB activities    Pain Relieving Factors rest and sitting    Effect of Pain on Daily Activities difficulty with long distance ambulation and standing ADLs    Multiple Pain Sites No            [facility] PT Assessment - 02/07/16 0001    Assessment   Medical Diagnosis balance disorder   Referring Provider Dr. [name]    Onset Date/Surgical Date 10/19/15   Hand Dominance Right   Next MD Visit 03/01/2016    Precautions   Precautions Fall   Restrictions   Weight Bearing Restrictions No   Balance Screen   Has the patient fallen in the past 6 months Yes   How many times? 1 additional time since his PT eval    Observation/Other Assessments   Focus on Therapeutic Outcomes (FOTO)  51% ( 49% limited)   Single Leg Stance   Comments SLS (L/R)= 1 sec/ 0 sec   Sit to Stand   Comments 5 sit to stand= 16.25   Posture/Postural Control   Posture/Postural Control Postural limitations   Postural Limitations Rounded Shoulders;Forward head;Increased thoracic kyphosis;Flexed trunk   Strength   Right Hip Flexion 4-/5   Right Hip Extension 3-/5   Right Hip ABduction 3+/5   Left Hip Flexion 4+/5   Left Hip Extension 3-/5   Left Hip ABduction 4-/5   Right Knee Flexion 4+/5   Right Knee Extension 4+/5   Left Knee Flexion 4+/5   Left Knee Extension 5/5   Right Ankle Dorsiflexion 5/5   Left Ankle Dorsiflexion 5/5   Timed Up and Go Test   Normal TUG (seconds) 18  FWW                     [facility] Adult PT Treatment/Exercise - 02/07/16 0001    Knee/Hip Exercises: Standing   Functional Squat 2 sets;10 reps   Functional Squat  Limitations 1 UE   SLS 4 sets, bilateral with B UE support    Knee/Hip Exercises: Seated   Abduction/Adduction  Strengthening;Both;3 sets;10 reps;Other (comment)  Bilateral; abduction/adduction with blue thera-band    Sit to Sand 10 reps;without UE support;Other (comment)             Balance Exercises - 02/07/16  1810    Balance Exercises: Standing   Standing Eyes Opened Narrow base of support (BOS);2 reps;Solid surface   Standing Eyes Closed Wide (BOA);2 reps;Solid surface           PT Education - 02/07/16 1558    Education provided Yes   Education Details Educated pt on 5/5 fall precautions and updated HEP with thera-band provided    Person(s) Educated Patient and spouse   Methods Explanation   Comprehension Verbalized understanding;Returned demonstration          PT Short Term Goals - 02/07/16 1550    PT SHORT TERM GOAL #1   Title Pt to be I in Hep in order to obtain goals in a timely manner.    Time 3   Period Weeks   Status On-going   PT SHORT TERM GOAL #2   Title Patient will be able to ambulate 78minutes without resting in order to progress to better health habits.    Time 3   Period Weeks   Status Achieved   PT SHORT TERM GOAL #3   Title Pt to be able to stand for 10 minutes to be able to complete self grooming activity without fear of falling    Time 3   Period Weeks   Status Achieved           PT Long Term Goals - 02/07/16 1552    PT LONG TERM GOAL #1   Title Pt to be I in advance HEP in order to decrease frequency of falling    Time 6   Period Weeks   Status On-going   PT LONG TERM GOAL #2   Title Pt to have not fallen in the past two weeks for reduced risk of injury    Baseline pt sustained a fall 2 weeks ago from 02/07/16   Time 6   Period Weeks   Status On-going   PT LONG TERM GOAL #3   Title Pt to be able to walk for 15 minutes without stopping for better health habits   Time 6   Period Weeks   Status On-going   PT LONG TERM GOAL  #4   Title Pt to be able to stand for 15 minutes to be safe in shower    Time 6   Period Weeks   Status On-going   PT LONG TERM GOAL #5   Title Pt strength to be increased by one grade to be able to walk in his home with a cane    Baseline R quad strength improved from 4/5 MMT to 5/5 MMT    Time 6   Period Weeks   Status On-going   Additional Long Term Goals   Additional Long Term Goals Yes   PT LONG TERM GOAL #6   Title Patient will improve TUG time to <14 sec in order to reduce the risk for falls with community ambulation.    Baseline Created on 02/07/2016   Time 3   Period Weeks   Status New               Plan - 02/07/16 1748    Clinical Impression Statement Mr. Than is a 70 yo male who has been seen for a total of 9 skilled PT visit with initial complaints of unsteady gait and balance. The pt is making slow, but steady progress towards stated PT goals with improved B LE strength and improved balance. Pt was able to achieve his short-term walking and standing  goals. The pt was able to demo improved sit to stand x 5 with less time required compared to PT eval time. B hamstring and quad strength have improved to WNL since his PT eval. Pt continues to present with balance deficits and B hip weakness with main deficits assessed with hip extensors and abductors. TUG was assessed this visit, which indicated a risk for falls (18 seconds with use of a FWW). Goals continue to be appropriate for desired outcomes with a TUG goal created this visit. The pt would continue to benefit from skilled PT in order to address current strength and balance deficits. Pt is in agreement with continued skilled PT per POC. HEP was reviewed and updated this visit including addition of seated resisted hip abduction and adduction.   Pt will benefit from skilled therapeutic intervention in order to improve on the following deficits Abnormal gait;Decreased activity tolerance;Decreased balance;Decreased  endurance;Decreased strength;Difficulty walking;Pain;Postural dysfunction   Rehab Potential Good   PT Frequency 3x / week   PT Duration 6 weeks   PT Treatment/Interventions Gait training;Functional mobility training;Therapeutic activities;Therapeutic exercise;Balance training;Patient/family education;Neuromuscular re-education   PT Next Visit Plan Continue with current PT POC for strengthening, balance, and standing tolerance.  Complete 30-day/10-day progress note.    PT Home Exercise Plan Reviewed current HEP with addition of resisted seated hip abduction and adduction, 3 sets of 10 reps, every other day with use of green thera-band; green and blue thera-band provided this visit   Consulted and Agree with Plan of Care Patient          G-Codes - February 27, 2016 1749    Functional Assessment Tool Used Foto   Functional Limitation Mobility: Walking and moving around   Mobility: Walking and Moving Around Current Status [phone]) At least 40 percent but less than 60 percent impaired, limited or restricted   Mobility: Walking and Moving Around Goal Status [phone]) At least 40 percent but less than 60 percent impaired, limited or restricted      Problem List Patient Active Problem List   Diagnosis Date Noted  . Fatty liver 02/06/2016  . 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 Altered mental status   . Abnormal LFTs   . Fecal incontinence   . Peripheral edema 10/15/2015  . Acute diastolic CHF (congestive heart failure) ([place]) 10/15/2015  . Generalized weakness 10/15/2015  . DVT (deep venous thrombosis) ([place]) 10/15/2015  . Atrial fibrillation ([place]) 10/15/2015  . Change in bowel habits   . Malignant neoplasm of colon ([place])   . Colon cancer ([place])   . Constipation 09/20/2015  . Elevated liver enzymes 09/20/2015  . Cellulitis 08/04/2015  . Family history of colon cancer   . Family history of breast cancer in mother   . Colon carcinoma ([place]) 04/03/2015  . Occult blood in stools   .  Dysphagia, pharyngoesophageal phase   . Melena 03/15/2015  . Heme positive stool 03/15/2015  . Esophageal dysphagia 03/15/2015  . Diarrhea 03/15/2015  . New onset atrial fibrillation ([place]) 01/02/2015  . Morbid obesity ([place]) 01/02/2015  . Varicose veins of lower extremities with other complications [id number]    Garen Lah, PT, DPT  02-27-2016, 6:15 PM  [facility] [address] [place], [place], [address] Phone: [phone]   Fax:  [phone]  Name: [name] MRN: [id number] Date of Birth: Mar 23, 1946

## 2016-02-09 ENCOUNTER — Ambulatory Visit (HOSPITAL_COMMUNITY): Payer: Medicare Other | Admitting: Physical Therapy

## 2016-02-09 DIAGNOSIS — R296 Repeated falls: Secondary | ICD-10-CM

## 2016-02-09 DIAGNOSIS — R269 Unspecified abnormalities of gait and mobility: Secondary | ICD-10-CM

## 2016-02-09 DIAGNOSIS — R29898 Other symptoms and signs involving the musculoskeletal system: Secondary | ICD-10-CM

## 2016-02-09 DIAGNOSIS — R6889 Other general symptoms and signs: Secondary | ICD-10-CM

## 2016-02-09 DIAGNOSIS — R262 Difficulty in walking, not elsewhere classified: Secondary | ICD-10-CM

## 2016-02-09 NOTE — Therapy (Signed)
[place], [place], [address] Phone: [phone]   Fax:  [phone]  Physical Therapy Treatment  Patient Details  Name: [name] MRN: [id number] Date of Birth: 08-17-1946 Referring Provider: Dr. [name]   Encounter Date: 02/09/2016      PT End of Session - 02/09/16 1332    Visit Number 10   Number of Visits 18   Authorization Type Medicare   Authorization Time Period 01/10/2016 to Mar 24, 2016;  30-day PN and G-codes completed on 02/22/2016   Authorization - Visit Number 9   Authorization - Number of Visits 18   PT Start Time [id number]   PT Stop Time 1346   PT Time Calculation (min) 44 min   Activity Tolerance Patient tolerated treatment well   Behavior During Therapy [facility] for tasks assessed/performed      Past Medical History  Diagnosis Date  . Varicose veins   . New onset atrial fibrillation ([place]) 01/02/2015  . Morbid obesity ([place]) 01/02/2015  . Hypertension   . Sleep apnea     been tested but has not received the CPAP yet  . GERD (gastroesophageal reflux disease)   . History of gout   . Cancer ([place]) 02/2015    colon  . Family history of colon cancer   . Family history of breast cancer in mother     Past Surgical History  Procedure Laterality Date  . Vein ligation and stripping      left leg  . Colonoscopy N/A 03/28/2015    SLF: 1. Abdominal pain, diarrhea, rectal bleeding due to obstructing colon mass  . Esophagogastroduodenoscopy N/A 03/28/2015    SLF: 1. dysphagia 2. Mild non-erosive gastritis.   . Esophageal dilation N/A 03/28/2015    Procedure: ESOPHAGEAL DILATION;  Surgeon: [name], MD;  [facility];  Service: Endoscopy;  Laterality: N/A;  . Partial colectomy N/A 04/03/2015    Procedure: PARTIAL COLECTOMY;  Surgeon: [name], MD;  Location: [facility] ORS;  Service: General;  Laterality: N/A;  . Portacath placement N/A 05/10/2015    Procedure: INSERTION PORT-A-CATH;   Surgeon: [name], MD;  Location: [facility] ORS;  Service: General;  Laterality: N/A;  left subclavian  . Skin cancer destruction Left 08/16/15    left side of nose and left back  . Colonoscopy N/A 10/03/2015    SLF: normal anastomosis, fair prep (polyps less than 1cm could be missed), anal fissures and verrucous anal canal lesion with benign biopsy. Next TCS in 09/2016.  . Colonoscopy N/A 10/02/2015    SLF: normal anastomosis, poor bowel prep, three anal fissures    There were no vitals filed for this visit.  Visit Diagnosis:  Abnormality of gait  Difficulty walking  Leg weakness, bilateral  Falls frequently  Decreased functional activity tolerance      Subjective Assessment - 02/09/16 1301    Subjective Pt states that his knees are  really hurting him today; stating the right bothers him more than the left.  Pt wife states that the new diuretic medication he his on is making him a little more off balance than usual.    Patient Stated Goals to have have more energy, to be able  to walk further; to get rid of the walker; reduce pain levels of the Rt knee    Currently in Pain? Yes   Pain Score 8    Pain Location Knee   Pain Orientation Right   Pain Descriptors /  Indicators Aching;Throbbing   Pain Type Chronic pain   Pain Onset More than a month ago   Pain Frequency Constant   Aggravating Factors  weight bearing    Pain Relieving Factors sitting    Multiple Pain Sites Yes   Pain Score 5   Pain Location Knee   Pain Orientation Left   Pain Descriptors / Indicators Aching;Throbbing   Pain Type Chronic pain   Pain Onset More than a month ago   Pain Frequency Constant   Aggravating Factors  weight bearing    Pain Relieving Factors rest                         [facility] Adult PT Treatment/Exercise - 02/09/16 1312    Exercises   Exercises Knee/Hip   Lumbar Exercises: Seated   Long Arc Quad on Chair Strengthening;Right;Left;15 reps;Weights   LAQ on Chair Weights  (lbs) 4   Lumbar Exercises: Supine   Bridge 15 reps   Straight Leg Raise 15 reps  Rt LE no weight; Lt 4#    Knee/Hip Exercises: Stretches   Passive Hamstring Stretch Right;2 reps;60 seconds   Knee/Hip Exercises: Standing   Heel Raises Both;10 reps   Functional Squat 10 reps   Knee/Hip Exercises: Supine   Heel Slides Both;10 reps;Limitations   Heel Slides Limitations 4#   Bridges 15 reps   Knee/Hip Exercises: Sidelying   Hip ABduction Strengthening;Both;10 reps   Hip ABduction Limitations 2.5 # RT'' 4# LT    Knee/Hip Exercises: Prone   Hamstring Curl 15 reps   Hamstring Curl Limitations 2.5 #   Hip Extension AAROM;Both;10 reps  therapist stabilizes low back                 PT Education - 02/09/16 1340    Education provided Yes   Education Details updated HEP    Person(s) Educated Patient   Methods Explanation   Comprehension Verbalized understanding;Returned demonstration          PT Short Term Goals - 02/07/16 1550    PT SHORT TERM GOAL #1   Title Pt to be Independent  in Hep in order to obtain goals in a timely manner.    Time 3   Period Weeks   Status On-going   PT SHORT TERM GOAL #2   Title Patient will be able to ambulate 63minutes without resting in order to progress to better health habits.    Time 3   Period Weeks   Status Achieved   PT SHORT TERM GOAL #3   Title Pt to be able to stand for 10 minutes to be able to complete self grooming activity without fear of falling    Time 3   Period Weeks   Status Achieved           PT Long Term Goals - 02/07/16 1552    PT LONG TERM GOAL #1   Title Pt to be Independent in advance HEP in order to decrease frequency of falling    Time 6   Period Weeks   Status On-going   PT LONG TERM GOAL #2   Title Pt to have not fallen in the past two weeks for reduced risk of injury    Baseline pt sustained a fall 2 weeks ago from 02/07/16   Time 6   Period Weeks   Status On-going   PT LONG TERM GOAL #3   Title  Pt to be able to walk  for 15 minutes without stopping for better health habits   Time 6   Period Weeks   Status On-going   PT LONG TERM GOAL #4   Title Pt to be able to stand for 15 minutes to be safe in shower    Time 6   Period Weeks   Status On-going   PT LONG TERM GOAL #5   Title Pt strength to be increased by one grade to be able to walk in his home with a cane    Baseline R quad strength improved from 4/5 MMT to 5/5 MMT    Time 6   Period Weeks   Status On-going   Additional Long Term Goals   Additional Long Term Goals Yes   PT LONG TERM GOAL #6   Title Patient will improve TUG time to <14 sec in order to reduce the risk for falls with community ambulation.    Baseline Created on 02/07/2016   Time 3   Period Weeks   Status New               Plan - 02/09/16 1341    Clinical Impression Statement The majority of todays session was completed in an open chain manner due to complaint of high pain.  Therapist adds weight to various exercises as tolerated by patient for improved strengthening.  Pt given update for Home exercise program.  Pt completed exercises with verbal cuing for proper technique.    Pt will benefit from skilled therapeutic intervention in order to improve on the following deficits Abnormal gait;Decreased activity tolerance;Decreased balance;Decreased endurance;Decreased strength;Difficulty walking;Pain;Postural dysfunction   Rehab Potential Good   PT Frequency 3x / week   PT Duration 6 weeks   PT Treatment/Interventions Gait training;Functional mobility training;Therapeutic activities;Therapeutic exercise;Balance training;Patient/family education;Neuromuscular re-education   PT Next Visit Plan progress to weight bearing activites as pt knee pain can tolerate.         Problem List Patient Active Problem List   Diagnosis Date Noted  . Fatty liver 02/06/2016  . 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 Altered mental status   . Abnormal LFTs    . Fecal incontinence   . Peripheral edema 10/15/2015  . Acute diastolic CHF (congestive heart failure) ([place]) 10/15/2015  . Generalized weakness 10/15/2015  . DVT (deep venous thrombosis) ([place]) 10/15/2015  . Atrial fibrillation ([place]) 10/15/2015  . Change in bowel habits   . Malignant neoplasm of colon ([place])   . Colon cancer ([place])   . Constipation 09/20/2015  . Elevated liver enzymes 09/20/2015  . Cellulitis 08/04/2015  . Family history of colon cancer   . Family history of breast cancer in mother   . Colon carcinoma ([place]) 04/03/2015  . Occult blood in stools   . Dysphagia, pharyngoesophageal phase   . Melena 03/15/2015  . Heme positive stool 03/15/2015  . Esophageal dysphagia 03/15/2015  . Diarrhea 03/15/2015  . New onset atrial fibrillation ([place]) 01/02/2015  . Morbid obesity ([place]) 01/02/2015  . Varicose veins of lower extremities with other complications [id number]    Rayetta Humphrey, PT CLT [phone] 02/09/2016, 1:46 PM  [address] [place], [place], [address] Phone: [phone]   Fax:  [phone]  Name: [name] MRN: [id number] Date of Birth: [birth year]

## 2016-02-09 NOTE — Patient Instructions (Signed)
Strengthening: Hip Abduction (Side-Lying)    Tighten muscles on front of left thigh, then lift leg _15___ inches from surface, keeping knee locked.  Repeat __15__ times per set. Do 1____ sets per session. Do __2__ sessions per day.  http://orth.exer.us/622   Copyright  VHI. All rights reserved.  Strengthening: Hip Extension (Prone)    Tighten muscles on front of left thigh, then lift leg __2__ inches from surface, keeping knee locked. Repeat __10__ times per set. Do _1___ sets per session. Do ____ sessions per day.  http://orth.exer.us/620   Copyright  VHI. All rights reserved.  Self-Mobilization: Knee Flexion (Prone)    Bring left heel toward buttocks as close as possible. Hold _3___ seconds. Relax. Repeat _10___ times per set. Do ____ sets per session. Do __2__ sessions per day. 1 http://orth.exer.us/596   Copyright  VHI. All rights reserved.  Functional Quadriceps: Chair Squat    Keeping feet flat on floor, shoulder width apart, squat as low as is comfortable. Use support as necessary. Repeat __10__ times per set. Do 1____ sets per session. Do __2__ sessions per day.  http://orth.exer.us/736   Copyright  VHI. All rights reserved.

## 2016-02-12 ENCOUNTER — Ambulatory Visit (HOSPITAL_COMMUNITY): Payer: Medicare Other | Admitting: Physical Therapy

## 2016-02-12 DIAGNOSIS — R262 Difficulty in walking, not elsewhere classified: Secondary | ICD-10-CM

## 2016-02-12 DIAGNOSIS — R269 Unspecified abnormalities of gait and mobility: Secondary | ICD-10-CM | POA: Diagnosis not present

## 2016-02-12 DIAGNOSIS — R6889 Other general symptoms and signs: Secondary | ICD-10-CM

## 2016-02-12 DIAGNOSIS — R29898 Other symptoms and signs involving the musculoskeletal system: Secondary | ICD-10-CM

## 2016-02-12 DIAGNOSIS — R296 Repeated falls: Secondary | ICD-10-CM

## 2016-02-12 NOTE — Therapy (Signed)
[place], [place], [address] Phone: [phone]   Fax:  [phone]  Physical Therapy Treatment  Patient Details  Name: [name] MRN: [id number] Date of Birth: 07-24-1946 Referring Provider: Dr. [name]   Encounter Date: 02/12/2016      PT End of Session - 02/12/16 1707    Visit Number 11   Number of Visits 18   Date for PT Re-Evaluation 03/08/16   Authorization Type Medicare   Authorization Time Period 01/10/2016 to 03/16/16;  30-day PN and G-codes completed on 02/14/2016   Authorization - Visit Number 10   Authorization - Number of Visits 18   PT Start Time 1100   PT Stop Time 1148   PT Time Calculation (min) 48 min   Activity Tolerance Patient tolerated treatment well   Behavior During Therapy [facility] for tasks assessed/performed      Past Medical History  Diagnosis Date  . Varicose veins   . New onset atrial fibrillation ([place]) 01/02/2015  . Morbid obesity ([place]) 01/02/2015  . Hypertension   . Sleep apnea     been tested but has not received the CPAP yet  . GERD (gastroesophageal reflux disease)   . History of gout   . Cancer ([place]) 02/2015    colon  . Family history of colon cancer   . Family history of breast cancer in mother     Past Surgical History  Procedure Laterality Date  . Vein ligation and stripping      left leg  . Colonoscopy N/A 03/28/2015    SLF: 1. Abdominal pain, diarrhea, rectal bleeding due to obstructing colon mass  . Esophagogastroduodenoscopy N/A 03/28/2015    SLF: 1. dysphagia 2. Mild non-erosive gastritis.   . Esophageal dilation N/A 03/28/2015    Procedure: ESOPHAGEAL DILATION;  Surgeon: [name], MD;  [facility];  Service: Endoscopy;  Laterality: N/A;  . Partial colectomy N/A 04/03/2015    Procedure: PARTIAL COLECTOMY;  Surgeon: [name], MD;  Location: [facility];  Service: General;  Laterality: N/A;  . Portacath placement N/A 05/10/2015     Procedure: INSERTION PORT-A-CATH;  Surgeon: [name], MD;  Location: [facility];  Service: General;  Laterality: N/A;  left subclavian  . Skin cancer destruction Left 08/16/15    left side of nose and left back  . Colonoscopy N/A 10/03/2015    SLF: normal anastomosis, fair prep (polyps less than 1cm could be missed), anal fissures and verrucous anal canal lesion with benign biopsy. Next TCS in 09/2016.  . Colonoscopy N/A 10/02/2015    SLF: normal anastomosis, poor bowel prep, three anal fissures    There were no vitals filed for this visit.  Visit Diagnosis:  Abnormality of gait  Difficulty walking  Leg weakness, bilateral  Falls frequently  Decreased functional activity tolerance      Subjective Assessment - 02/12/16 1108    Subjective Pt states his knees are still hurting badly 8/10 and last session helped keep his pain minimal.   Currently in Pain? Yes   Pain Score 8    Pain Location Knee   Pain Orientation Right   Pain Descriptors / Indicators Aching   Aggravating Factors  weight bearing                         [facility] Adult PT Treatment/Exercise - 02/12/16 1110    Lumbar Exercises: Seated   Long  Arc [employer] on Chair --   LAQ on [employer] (lbs) --   Knee/Hip Exercises: Hydrologist Both;2 reps;30 seconds   Knee/Hip Exercises: Seated   Long Arc Quad 15 reps   Long Arc Quad Weight 4 lbs.   Knee/Hip Exercises: Supine   Heel Slides Both;10 reps;Limitations   Heel Slides Limitations no weight   Bridges 15 reps   Straight Leg Raises Both;15 reps   Knee/Hip Exercises: Sidelying   Hip ABduction Strengthening;Both;10 reps   Hip ABduction Limitations 2.5 # RT'' 4# LT    Knee/Hip Exercises: Prone   Hamstring Curl 15 reps   Hamstring Curl Limitations 4#   Hip Extension Both;15 reps                  PT Short Term Goals - 02/07/16 1550    PT SHORT TERM GOAL #1   Title Pt to be I in Hep in order to obtain goals in a  timely manner.    Time 3   Period Weeks   Status On-going   PT SHORT TERM GOAL #2   Title Patient will be able to ambulate 66minutes without resting in order to progress to better health habits.    Time 3   Period Weeks   Status Achieved   PT SHORT TERM GOAL #3   Title Pt to be able to stand for 10 minutes to be able to complete self grooming activity without fear of falling    Time 3   Period Weeks   Status Achieved           PT Long Term Goals - 02/07/16 1552    PT LONG TERM GOAL #1   Title Pt to be I in advance HEP in order to decrease frequency of falling    Time 6   Period Weeks   Status On-going   PT LONG TERM GOAL #2   Title Pt to have not fallen in the past two weeks for reduced risk of injury    Baseline pt sustained a fall 2 weeks ago from 02/07/16   Time 6   Period Weeks   Status On-going   PT LONG TERM GOAL #3   Title Pt to be able to walk for 15 minutes without stopping for better health habits   Time 6   Period Weeks   Status On-going   PT LONG TERM GOAL #4   Title Pt to be able to stand for 15 minutes to be safe in shower    Time 6   Period Weeks   Status On-going   PT LONG TERM GOAL #5   Title Pt strength to be increased by one grade to be able to walk in his home with a cane    Baseline R quad strength improved from 4/5 MMT to 5/5 MMT    Time 6   Period Weeks   Status On-going   Additional Long Term Goals   Additional Long Term Goals Yes   PT LONG TERM GOAL #6   Title Patient will improve TUG time to <14 sec in order to reduce the risk for falls with community ambulation.    Baseline Created on 02/07/2016   Time 3   Period Weeks   Status New               Plan - 02/12/16 1708    Clinical Impression Statement Pt with continued high complaints of pain in his knees.  Focused session on non-weight bearing therex per patient request and increased pain.  Pt with less assistance needed today with hip extension and able to increase weight with  hamstring curls.  Encouraged patient to continue completing these exercises at home for optimal benefits.  Also urged patient to return to MD if pain persists as it is hindering progress.     Pt will benefit from skilled therapeutic intervention in order to improve on the following deficits Abnormal gait;Decreased activity tolerance;Decreased balance;Decreased endurance;Decreased strength;Difficulty walking;Pain;Postural dysfunction   Rehab Potential Good   PT Frequency 3x / week   PT Duration 6 weeks   PT Treatment/Interventions Gait training;Functional mobility training;Therapeutic activities;Therapeutic exercise;Balance training;Patient/family education;Neuromuscular re-education   PT Next Visit Plan Resume weight bearing activites as pain decreases. Continue to progress towards goals.         Problem List Patient Active Problem List   Diagnosis Date Noted  . Fatty liver 02/06/2016  . 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 Altered mental status   . Abnormal LFTs   . Fecal incontinence   . Peripheral edema 10/15/2015  . Acute diastolic CHF (congestive heart failure) ([place]) 10/15/2015  . Generalized weakness 10/15/2015  . DVT (deep venous thrombosis) ([place]) 10/15/2015  . Atrial fibrillation ([place]) 10/15/2015  . Change in bowel habits   . Malignant neoplasm of colon ([place])   . Colon cancer ([place])   . Constipation 09/20/2015  . Elevated liver enzymes 09/20/2015  . Cellulitis 08/04/2015  . Family history of colon cancer   . Family history of breast cancer in mother   . Colon carcinoma ([place]) 04/03/2015  . Occult blood in stools   . Dysphagia, pharyngoesophageal phase   . Melena 03/15/2015  . Heme positive stool 03/15/2015  . Esophageal dysphagia 03/15/2015  . Diarrhea 03/15/2015  . New onset atrial fibrillation ([place]) 01/02/2015  . Morbid obesity ([place]) 01/02/2015  . Varicose veins of lower extremities with other complications [id number]   Teena Irani,  PTA/CLT [phone] 02/12/2016, 5:13 PM  [place] [address] [place], [place], [address] Phone: [phone]   Fax:  [phone]  Name: [name] MRN: [id number] Date of Birth: 1946-03-28

## 2016-02-13 ENCOUNTER — Encounter: Payer: Self-pay | Admitting: Gastroenterology

## 2016-02-13 ENCOUNTER — Ambulatory Visit: Payer: Self-pay | Admitting: *Deleted

## 2016-02-13 NOTE — Assessment & Plan Note (Signed)
Due for surveillance Oct 2017. Return in Sept to arrange.

## 2016-02-13 NOTE — Assessment & Plan Note (Signed)
70 year old with fatty liver. Check elastography for baseline. LFTs every 6 months. Return in Sept 2017.

## 2016-02-14 ENCOUNTER — Ambulatory Visit (HOSPITAL_COMMUNITY): Payer: Medicare Other

## 2016-02-14 DIAGNOSIS — R29898 Other symptoms and signs involving the musculoskeletal system: Secondary | ICD-10-CM

## 2016-02-14 DIAGNOSIS — R296 Repeated falls: Secondary | ICD-10-CM

## 2016-02-14 DIAGNOSIS — R262 Difficulty in walking, not elsewhere classified: Secondary | ICD-10-CM

## 2016-02-14 DIAGNOSIS — R269 Unspecified abnormalities of gait and mobility: Secondary | ICD-10-CM

## 2016-02-14 DIAGNOSIS — R6889 Other general symptoms and signs: Secondary | ICD-10-CM

## 2016-02-14 NOTE — Therapy (Signed)
[facility] [place], [place], [address] Phone: [phone]   Fax:  [phone]  Physical Therapy Treatment  Patient Details  Name: [name] MRN: [id number] Date of Birth: 1946/01/15 Referring Provider: Dr. [name]   Encounter Date: 02/14/2016      PT End of Session - 02/14/16 1102    Visit Number 12   Number of Visits 18   Date for PT Re-Evaluation 03/08/16   Authorization Type Medicare   Authorization Time Period 01/10/2016 to 2016/04/06;  30-day PN and G-codes completed on 06-Mar-2016   Authorization - Visit Number 12   Authorization - Number of Visits 18   PT Start Time 1056   PT Stop Time 1147   PT Time Calculation (min) 51 min   Equipment Utilized During Treatment Gait belt   Activity Tolerance Patient tolerated treatment well   Behavior During Therapy [facility] for tasks assessed/performed      Past Medical History  Diagnosis Date  . Varicose veins   . New onset atrial fibrillation ([place]) 01/02/2015  . Morbid obesity ([place]) 01/02/2015  . Hypertension   . Sleep apnea     been tested but has not received the CPAP yet  . GERD (gastroesophageal reflux disease)   . History of gout   . Cancer ([place]) 02/2015    colon  . Family history of colon cancer   . Family history of breast cancer in mother   . Colon cancer [facility])     March 2016    Past Surgical History  Procedure Laterality Date  . Vein ligation and stripping      left leg  . Colonoscopy N/A 03/28/2015    SLF: 1. Abdominal pain, diarrhea, rectal bleeding due to obstructing colon mass  . Esophagogastroduodenoscopy N/A 03/28/2015    SLF: 1. dysphagia 2. Mild non-erosive gastritis.   . Esophageal dilation N/A 03/28/2015    Procedure: ESOPHAGEAL DILATION;  Surgeon: [name], MD;  Location: [facility]O SU[facility];  Service: Endoscopy;  Laterality: N/A;  . Partial colectomy N/A 04/03/2015    Procedure: PARTIAL COLECTOMY;  Surgeon: [name], MD;  Location:  [facility] ORS;  Service: General;  Laterality: N/A;  . Portacath placement N/A 05/10/2015    Procedure: INSERTION PORT-A-CATH;  Surgeon: [name], MD;  Location: [facility] ORS;  Service: General;  Laterality: N/A;  left subclavian  . Skin cancer destruction Left 08/16/15    left side of nose and left back  . Colonoscopy N/A 10/03/2015    SLF: normal anastomosis, fair prep (polyps less than 1cm could be missed), anal fissures and verrucous anal canal lesion with benign biopsy. Next TCS in 09/2016.  . Colonoscopy N/A 10/02/2015    SLF: normal anastomosis, poor bowel prep, three anal fissures    There were no vitals filed for this visit.  Visit Diagnosis:  Abnormality of gait  Difficulty walking  Leg weakness, bilateral  Falls frequently  Decreased functional activity tolerance      Subjective Assessment - 02/14/16 1101    Subjective Pt stated knee is feeling a little better today, mainly soreness pain scale 6/10   Pertinent History Patient had cancer surgery in april 2016 to removed a18inches of colon, mD beleives they removed all of colon, Patient began Chemo 05/17/15. patient had surgery 20 years ago to "strip veins" and has sicne had swelling in both legs. patint enjos shooting pool, patient states limited physical activity due to bad knee and limited energy  levels noting "I can t walk across the road."  "I feel worn out to where i can sit up for a while then have to sit back down and rest.  little pain with sit to stance.    Patient Stated Goals to have have more energy, to be able  to walk further; to get rid of the walker; reduce pain levels of the Rt knee    Currently in Pain? Yes   Pain Score 6    Pain Location Knee   Pain Orientation Right   Pain Descriptors / Indicators Sore   Pain Type Chronic pain   Pain Onset More than a month ago   Pain Frequency Constant   Aggravating Factors  weight bearing   Pain Relieving Factors sitting   Effect of Pain on Daily Activities difficulty with  long distance ambulation and standing ADLs              [facility] Adult PT Treatment/Exercise - 02/14/16 0001    Knee/Hip Exercises: Aerobic   [place] L2 resistance L3 x 8 min at end of session    Knee/Hip Exercises: Standing   Heel Raises 15 reps   Heel Raises Limitations heel and toe raises   Functional Squat 10 reps   Knee/Hip Exercises: Seated   Long Arc Quad 15 reps   Long Arc Quad Weight 4 lbs.   Sit to Sand 10 reps;without UE support;Other (comment)   Knee/Hip Exercises: Supine   Heel Slides Both;10 reps;Limitations   Heel Slides Limitations no weight   Bridges 15 reps   Straight Leg Raises Both;15 reps  2#   Knee/Hip Exercises: Sidelying   Hip ABduction Strengthening;Both;15 reps   Hip ABduction Limitations 2.5 # RT'' 4# LT    Knee/Hip Exercises: Prone   Hamstring Curl 15 reps   Hamstring Curl Limitations 4#   Hip Extension Both;15 reps                  PT Short Term Goals - 02/07/16 1550    PT SHORT TERM GOAL #1   Title Pt to be I in Hep in order to obtain goals in a timely manner.    Time 3   Period Weeks   Status On-going   PT SHORT TERM GOAL #2   Title Patient will be able to ambulate 12minutes without resting in order to progress to better health habits.    Time 3   Period Weeks   Status Achieved   PT SHORT TERM GOAL #3   Title Pt to be able to stand for 10 minutes to be able to complete self grooming activity without fear of falling    Time 3   Period Weeks   Status Achieved           PT Long Term Goals - 02/07/16 1552    PT LONG TERM GOAL #1   Title Pt to be I in advance HEP in order to decrease frequency of falling    Time 6   Period Weeks   Status On-going   PT LONG TERM GOAL #2   Title Pt to have not fallen in the past two weeks for reduced risk of injury    Baseline pt sustained a fall 2 weeks ago from 02/07/16   Time 6   Period Weeks   Status On-going   PT LONG TERM GOAL #3   Title Pt to be able to walk for 15 minutes  without stopping for better health  habits   Time 6   Period Weeks   Status On-going   PT LONG TERM GOAL #4   Title Pt to be able to stand for 15 minutes to be safe in shower    Time 6   Period Weeks   Status On-going   PT LONG TERM GOAL #5   Title Pt strength to be increased by one grade to be able to walk in his home with a cane    Baseline R quad strength improved from 4/5 MMT to 5/5 MMT    Time 6   Period Weeks   Status On-going   Additional Long Term Goals   Additional Long Term Goals Yes   PT LONG TERM GOAL #6   Title Patient will improve TUG time to <14 sec in order to reduce the risk for falls with community ambulation.    Baseline Created on 02/07/2016   Time 3   Period Weeks   Status New               Plan - 02/14/16 1148    Clinical Impression Statement Pt continues to be limited with high levels of pain knee especially with weight bearing activiites.  Able to complete minimal weight bearing activitiesdue to pain this session so session focus on open chain kinetic exercises for LE strengthening.  Pt continues to demonstrate weak gluteal/hip musculature with visible musculature fatigiue at 15 reps for all exercises.  Ended session with Nustep to improve activity tolerance and strengthening, no reprts of increased pain with machine.     PT Next Visit Plan Resume weight bearing activites as pain decreases. Continue to progress towards goals.         Problem List Patient Active Problem List   Diagnosis Date Noted  . Fatty liver 02/06/2016  . 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 Altered mental status   . Abnormal LFTs   . Fecal incontinence   . Peripheral edema 10/15/2015  . Acute diastolic CHF (congestive heart failure) ([place]) 10/15/2015  . Generalized weakness 10/15/2015  . DVT (deep venous thrombosis) ([place]) 10/15/2015  . Atrial fibrillation ([place]) 10/15/2015  . Change in bowel habits   . Malignant neoplasm of colon ([place])   . Colon cancer  ([place])   . Constipation 09/20/2015  . Elevated liver enzymes 09/20/2015  . Cellulitis 08/04/2015  . Family history of colon cancer   . Family history of breast cancer in mother   . Colon carcinoma ([place]) 04/03/2015  . Occult blood in stools   . Dysphagia, pharyngoesophageal phase   . Melena 03/15/2015  . Heme positive stool 03/15/2015  . Esophageal dysphagia 03/15/2015  . Diarrhea 03/15/2015  . New onset atrial fibrillation ([place]) 01/02/2015  . Morbid obesity ([place]) 01/02/2015  . Varicose veins of lower extremities with other complications [id number]  Ihor Austin, LPTA; CBIS [phone]   Aldona Lento 02/14/2016, 11:57 AM  [place], [place], [address] Phone: [phone]   Fax:  [phone]  Name: [name] MRN: [id number] Date of Birth: Dec 30, 1946

## 2016-02-14 NOTE — Progress Notes (Signed)
cc'ed to pcp °

## 2016-02-15 ENCOUNTER — Ambulatory Visit (HOSPITAL_COMMUNITY)
Admission: RE | Admit: 2016-02-15 | Discharge: 2016-02-15 | Disposition: A | Discharge status: Home / Self Care | Payer: Medicare Other | Source: Ambulatory Visit | Attending: Gastroenterology | Admitting: Gastroenterology

## 2016-02-15 DIAGNOSIS — K760 Fatty (change of) liver, not elsewhere classified: Secondary | ICD-10-CM | POA: Diagnosis not present

## 2016-02-15 DIAGNOSIS — I4891 Unspecified atrial fibrillation: Secondary | ICD-10-CM | POA: Diagnosis not present

## 2016-02-16 ENCOUNTER — Ambulatory Visit (HOSPITAL_COMMUNITY): Payer: Medicare Other | Admitting: Physical Therapy

## 2016-02-16 DIAGNOSIS — R262 Difficulty in walking, not elsewhere classified: Secondary | ICD-10-CM

## 2016-02-16 DIAGNOSIS — R6889 Other general symptoms and signs: Secondary | ICD-10-CM

## 2016-02-16 DIAGNOSIS — R29898 Other symptoms and signs involving the musculoskeletal system: Secondary | ICD-10-CM

## 2016-02-16 DIAGNOSIS — R269 Unspecified abnormalities of gait and mobility: Secondary | ICD-10-CM | POA: Diagnosis not present

## 2016-02-16 DIAGNOSIS — R296 Repeated falls: Secondary | ICD-10-CM

## 2016-02-16 NOTE — Therapy (Signed)
[facility], [place], [address] Phone: [phone]   Fax:  [phone]  Physical Therapy Treatment  Patient Details  Name: [name] MRN: [id number] Date of Birth: 10/03/1946 Referring Provider: Dr. [name]   Encounter Date: 02/16/2016      PT End of Session - 02/16/16 1126    Visit Number 13   Number of Visits 18   Authorization Type Medicare   Authorization Time Period 01/10/2016 to 2016-03-28;  30-day PN and G-codes completed on 02-26-16   Authorization - Visit Number 13   Authorization - Number of Visits 18   PT Start Time 1100   PT Stop Time 1145   PT Time Calculation (min) 45 min   Equipment Utilized During Treatment Gait belt   Activity Tolerance Patient tolerated treatment well      Past Medical History  Diagnosis Date  . Varicose veins   . New onset atrial fibrillation ([place]) 01/02/2015  . Morbid obesity ([place]) 01/02/2015  . Hypertension   . Sleep apnea     been tested but has not received the CPAP yet  . GERD (gastroesophageal reflux disease)   . History of gout   . Cancer ([place]) 02/2015    colon  . Family history of colon cancer   . Family history of breast cancer in mother   . Colon cancer [facility])     March 2016    Past Surgical History  Procedure Laterality Date  . Vein ligation and stripping      left leg  . Colonoscopy N/A 03/28/2015    SLF: 1. Abdominal pain, diarrhea, rectal bleeding due to obstructing colon mass  . Esophagogastroduodenoscopy N/A 03/28/2015    SLF: 1. dysphagia 2. Mild non-erosive gastritis.   . Esophageal dilation N/A 03/28/2015    Procedure: ESOPHAGEAL DILATION;  Surgeon: [name], MD;  Location[facility];  Service: Endoscopy;  Laterality: N/A;  . Partial colectomy N/A 04/03/2015    Procedure: PARTIAL COLECTOMY;  Surgeon: [name], MD;  Location: [facility] ORS;  Service: General;  Laterality: N/A;  . Portacath placement N/A 05/10/2015    Procedure:  INSERTION PORT-A-CATH;  Surgeon: [name], MD;  Location: [facility] ORS;  Service: General;  Laterality: N/A;  left subclavian  . Skin cancer destruction Left 08/16/15    left side of nose and left back  . Colonoscopy N/A 10/03/2015    SLF: normal anastomosis, fair prep (polyps less than 1cm could be missed), anal fissures and verrucous anal canal lesion with benign biopsy. Next TCS in 09/2016.  . Colonoscopy N/A 10/02/2015    SLF: normal anastomosis, poor bowel prep, three anal fissures    There were no vitals filed for this visit.  Visit Diagnosis:  Abnormality of gait  Difficulty walking  Leg weakness, bilateral  Falls frequently  Decreased functional activity tolerance      Subjective Assessment - 02/16/16 1105    Subjective The patient states that his right knee has really been bothering him.     Pertinent History Patient had cancer surgery in april 2016 to removed a18inches of colon, mD beleives they removed all of colon, Patient began Chemo 05/17/15. patient had surgery 20 years ago to "strip veins" and has sicne had swelling in both legs. patint [place] shooting pool, patient states limited physical activity due to bad knee and limited energy levels noting "I can t walk across the road."  "I feel worn out to where  i can sit up for a while then have to sit back down and rest.  little pain with sit to stance.    Currently in Pain? Yes   Pain Score 8    Pain Location Knee   Pain Orientation Right   Pain Type Chronic pain   Pain Onset More than a month ago   Pain Frequency Constant   Aggravating Factors  weightbearing    Pain Relieving Factors sitting                          [facility] Adult PT Treatment/Exercise - 02/16/16 1117    Exercises   Exercises Lumbar   Lumbar Exercises: Standing   Other Standing Lumbar Exercises 3 D hip excursion x 3    Lumbar Exercises: Seated   Other Seated Lumbar Exercises scapular retraction x 10   Lumbar Exercises: Supine   Other  Supine Lumbar Exercises decompression exercises 1-5; t-band decompression              Balance Exercises - 02/16/16 1118    Balance Exercises: Standing   Standing Eyes Opened Narrow base of support (BOS);5 reps  eyes closed for 30 "; repeat eyes open with head turns x 5.   Tandem Stance Eyes open;3 reps;30 secs               PT Education - 02/16/16 1132    Education provided Yes   Education Details decompression exercises/ hip excursion    Person(s) Educated Patient   Methods Explanation;Tactile cues;Verbal cues;Handout   Comprehension Verbalized understanding;Returned demonstration          PT Short Term Goals - 02/16/16 1132    PT SHORT TERM GOAL #1   Title Pt to be independent  in Hep in order to obtain goals in a timely manner.    Time 3   Period Weeks   Status Achieved   PT SHORT TERM GOAL #2   Title Patient will be able to ambulate 76mnutes without resting in order to progress to better health habits.    Baseline 6/29- patient states he is able to ambulate around 4 minutes before fatiguing    Time 3   Period Weeks   Status On-going   PT SHORT TERM GOAL #3   Title Pt to be able to stand for 10 minutes to be able to complete self grooming activity without fear of falling    Time 3   Period Weeks   Status Achieved   PT SHORT TERM GOAL #4   Title Patient will average > 4000 steps a day   Baseline 6/29- 1500 steps on average    Time 4   Period Weeks   Status Not Met           PT Long Term Goals - 02/16/16 1134    PT LONG TERM GOAL #1   Title Pt to be Independent  in advance HEP in order to decrease frequency of falling    Time 6   Period Weeks   Status On-going   PT LONG TERM GOAL #2   Title Pt to have not fallen in the past two weeks for reduced risk of injury    Baseline pt sustained a fall 2 weeks ago from 02/07/16   Time 6   Period Weeks   Status On-going   PT LONG TERM GOAL #3   Title Pt to be able to walk for 15 minutes without stopping  for better health habits   Time 6   Period Weeks   Status On-going   PT LONG TERM GOAL #4   Title Pt to be able to stand for 15 minutes to be safe in shower    Time 6   Period Weeks   Status On-going   PT LONG TERM GOAL #5   Title Pt strength to be increased by one grade to be able to walk in his home with a cane    Baseline R quad strength improved from 4/5 MMT to 5/5 MMT    Time 6   Period Weeks   Status On-going   PT LONG TERM GOAL #6   Title Patient will improve TUG time to <14 sec in order to reduce the risk for falls with community ambulation.    Baseline Created on 02/07/2016   Time 3   Period Weeks   Status On-going               Plan - 02/16/16 1126    Clinical Impression Statement Pt Rt knee gave way during standing activity.  PT needed maximum assist to avoid falling.  Pt lowered down to the mat which was behind him without injury or incident.    All other exercises completed non weight bearing.  Pt states that this has occured a couple of times. Therapist encouraged pt to speak to his MD about this.   Pt instructed in decompression exercises both with and without theraband.          Problem List Patient Active Problem List   Diagnosis Date Noted  . Fatty liver 02/06/2016  . 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 Altered mental status   . Abnormal LFTs   . Fecal incontinence   . Peripheral edema 10/15/2015  . Acute diastolic CHF (congestive heart failure) ([place]) 10/15/2015  . Generalized weakness 10/15/2015  . DVT (deep venous thrombosis) ([place]) 10/15/2015  . Atrial fibrillation ([place]) 10/15/2015  . Change in bowel habits   . Malignant neoplasm of colon ([place])   . Colon cancer ([place])   . Constipation 09/20/2015  . Elevated liver enzymes 09/20/2015  . Cellulitis 08/04/2015  . Family history of colon cancer   . Family history of breast cancer in mother   . Colon carcinoma ([place]) 04/03/2015  . Occult blood in stools   . Dysphagia,  pharyngoesophageal phase   . Melena 03/15/2015  . Heme positive stool 03/15/2015  . Esophageal dysphagia 03/15/2015  . Diarrhea 03/15/2015  . New onset atrial fibrillation ([place]) 01/02/2015  . Morbid obesity ([place]) 01/02/2015  . Varicose veins of lower extremities with other complications 09/31/1216  Rayetta Humphrey, PT CLT [phone] 02/16/2016, 11:45 AM  [place], [place], [address] Phone: [phone]   Fax:  [phone]  Name: [name] MRN: [id number] Date of Birth: 1946/01/06

## 2016-02-19 ENCOUNTER — Ambulatory Visit (HOSPITAL_COMMUNITY): Payer: Medicare Other | Admitting: Physical Therapy

## 2016-02-19 DIAGNOSIS — R269 Unspecified abnormalities of gait and mobility: Secondary | ICD-10-CM

## 2016-02-19 DIAGNOSIS — R296 Repeated falls: Secondary | ICD-10-CM

## 2016-02-19 DIAGNOSIS — R29898 Other symptoms and signs involving the musculoskeletal system: Secondary | ICD-10-CM

## 2016-02-19 DIAGNOSIS — R6889 Other general symptoms and signs: Secondary | ICD-10-CM

## 2016-02-19 DIAGNOSIS — R262 Difficulty in walking, not elsewhere classified: Secondary | ICD-10-CM

## 2016-02-19 NOTE — Therapy (Signed)
[place], [place], [address] Phone: [phone]   Fax:  [phone]  Physical Therapy Treatment  Patient Details  Name: [name] MRN: [id number] Date of Birth: Dec 18, 1946 Referring Provider: Dr. [name]   Encounter Date: 02/19/2016      PT End of Session - 02/19/16 1223    Visit Number 14   Number of Visits 18   Date for PT Re-Evaluation 03/08/16   Authorization - Visit Number 14   Authorization - Number of Visits 18   PT Start Time 1108   PT Stop Time 1150   PT Time Calculation (min) 42 min   Equipment Utilized During Treatment Gait belt   Activity Tolerance Patient tolerated treatment well      Past Medical History  Diagnosis Date  . Varicose veins   . New onset atrial fibrillation ([place]) 01/02/2015  . Morbid obesity ([place]) 01/02/2015  . Hypertension   . Sleep apnea     been tested but has not received the CPAP yet  . GERD (gastroesophageal reflux disease)   . History of gout   . Cancer ([place]) 02/2015    colon  . Family history of colon cancer   . Family history of breast cancer in mother   . Colon cancer [facility])     March 2016    Past Surgical History  Procedure Laterality Date  . Vein ligation and stripping      left leg  . Colonoscopy N/A 03/28/2015    SLF: 1. Abdominal pain, diarrhea, rectal bleeding due to obstructing colon mass  . Esophagogastroduodenoscopy N/A 03/28/2015    SLF: 1. dysphagia 2. Mild non-erosive gastritis.   . Esophageal dilation N/A 03/28/2015    Procedure: ESOPHAGEAL DILATION;  Surgeon: [name], MD;  Location: [facility];  Service: Endoscopy;  Laterality: N/A;  . Partial colectomy N/A 04/03/2015    Procedure: PARTIAL COLECTOMY;  Surgeon: [name], MD;  Location: [facility] ORS;  Service: General;  Laterality: N/A;  . Portacath placement N/A 05/10/2015    Procedure: INSERTION PORT-A-CATH;  Surgeon: [name], MD;  Location: [facility] ORS;  Service:  General;  Laterality: N/A;  left subclavian  . Skin cancer destruction Left 08/16/15    left side of nose and left back  . Colonoscopy N/A 10/03/2015    SLF: normal anastomosis, fair prep (polyps less than 1cm could be missed), anal fissures and verrucous anal canal lesion with benign biopsy. Next TCS in 09/2016.  . Colonoscopy N/A 10/02/2015    SLF: normal anastomosis, poor bowel prep, three anal fissures    There were no vitals filed for this visit.  Visit Diagnosis:  Abnormality of gait  Difficulty walking  Leg weakness, bilateral  Falls frequently  Decreased functional activity tolerance      Subjective Assessment - 02/19/16 1113    Subjective Pt states that he almost fell into his mirror this morning due to losing his balance.    Pertinent History Patient had cancer surgery in april 2016 to removed a18inches of colon, mD beleives they removed all of colon, Patient began Chemo 05/17/15. patient had surgery 20 years ago to "strip veins" and has sicne had swelling in both legs. patint enjos shooting pool, patient states limited physical activity due to bad knee and limited energy levels noting "I can t walk across the road."  "I feel worn out to where i can sit up for a while then have to sit  back down and rest.  little pain with sit to stance.    Patient Stated Goals to have have more energy, to be able  to walk further; to get rid of the walker; reduce pain levels of the Rt knee    Currently in Pain? Yes   Pain Score 7    Pain Location Knee   Pain Orientation Right   Pain Descriptors / Indicators Aching;Sore   Pain Type Chronic pain   Pain Onset More than a month ago   Pain Frequency Constant                         [facility] Adult PT Treatment/Exercise - 02/19/16 0001    Lumbar Exercises: Standing   Wall Slides 10 reps   Wall Slides Limitations ball on wall   Other Standing Lumbar Exercises 3 D hip excursion x 3    Lumbar Exercises: Seated   Long Arc Quad on  Chair Strengthening;Both;15 reps   LAQ on Chair Weights (lbs) 4   Sit to Stand 10 reps   Other Seated Lumbar Exercises scapular retraction;ab and gluteal set  x 10             Balance Exercises - 02/19/16 1125    Balance Exercises: Standing   Standing Eyes Opened Narrow base of support (BOS);5 reps  eyes closed for 30 "; repeat eyes open with head turns x 5.   Tandem Stance Eyes open;2 reps;Time  60"   Sidestepping 1 rep  at mat   Marching Limitations --  attempted too much knee pain.              PT Short Term Goals - 02/16/16 1132    PT SHORT TERM GOAL #1   Title Pt to be independent  in Hep in order to obtain goals in a timely manner.    Time 3   Period Weeks   Status Achieved   PT SHORT TERM GOAL #2   Title Patient will be able to ambulate 73mnutes without resting in order to progress to better health habits.    Baseline 6/29- patient states he is able to ambulate around 4 minutes before fatiguing    Time 3   Period Weeks   Status On-going   PT SHORT TERM GOAL #3   Title Pt to be able to stand for 10 minutes to be able to complete self grooming activity without fear of falling    Time 3   Period Weeks   Status Achieved   PT SHORT TERM GOAL #4   Title Patient will average > 4000 steps a day   Baseline 6/29- 1500 steps on average    Time 4   Period Weeks   Status Not Met           PT Long Term Goals - 02/16/16 1134    PT LONG TERM GOAL #1   Title Pt to be Independent  in advance HEP in order to decrease frequency of falling    Time 6   Period Weeks   Status On-going   PT LONG TERM GOAL #2   Title Pt to have not fallen in the past two weeks for reduced risk of injury    Baseline pt sustained a fall 2 weeks ago from 02/07/16   Time 6   Period Weeks   Status On-going   PT LONG TERM GOAL #3   Title Pt to be able to walk for  15 minutes without stopping for better health habits   Time 6   Period Weeks   Status On-going   PT LONG TERM GOAL #4    Title Pt to be able to stand for 15 minutes to be safe in shower    Time 6   Period Weeks   Status On-going   PT LONG TERM GOAL #5   Title Pt strength to be increased by one grade to be able to walk in his home with a cane    Baseline R quad strength improved from 4/5 MMT to 5/5 MMT    Time 6   Period Weeks   Status On-going   PT LONG TERM GOAL #6   Title Patient will improve TUG time to <14 sec in order to reduce the risk for falls with community ambulation.    Baseline Created on 02/07/2016   Time 3   Period Weeks   Status On-going               Plan - 02/19/16 1224    Clinical Impression Statement Pt needed multiple breaks throughout treatment today.  Pt continues to need min guard assist with balance activities.  Pt returns to MD next week will reassess in the next two visits for MD update.      PT Next Visit Plan reassess in one to two visits.  No single leg activity due to increasing OA pain.         Problem List Patient Active Problem List   Diagnosis Date Noted  . Fatty liver 02/06/2016  . 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 Altered mental status   . Abnormal LFTs   . Fecal incontinence   . Peripheral edema 10/15/2015  . Acute diastolic CHF (congestive heart failure) ([place]) 10/15/2015  . Generalized weakness 10/15/2015  . DVT (deep venous thrombosis) ([place]) 10/15/2015  . Atrial fibrillation ([place]) 10/15/2015  . Change in bowel habits   . Malignant neoplasm of colon ([place])   . Colon cancer ([place])   . Constipation 09/20/2015  . Elevated liver enzymes 09/20/2015  . Cellulitis 08/04/2015  . Family history of colon cancer   . Family history of breast cancer in mother   . Colon carcinoma ([place]) 04/03/2015  . Occult blood in stools   . Dysphagia, pharyngoesophageal phase   . Melena 03/15/2015  . Heme positive stool 03/15/2015  . Esophageal dysphagia 03/15/2015  . Diarrhea 03/15/2015  . New onset atrial fibrillation ([place]) 01/02/2015  . Morbid  obesity ([place]) 01/02/2015  . Varicose veins of lower extremities with other complications 49/97/1820    Rayetta Humphrey, PT CLT [phone] 02/19/2016, 12:28 PM  [place] [address] [place], [place], [address] Phone: [phone]   Fax:  [phone]  Name: TIMUR NIBERT MRN: [id number] Date of Birth: Nov 01, 1946

## 2016-02-20 ENCOUNTER — Other Ambulatory Visit: Payer: Self-pay | Admitting: *Deleted

## 2016-02-20 ENCOUNTER — Encounter: Payer: Self-pay | Admitting: *Deleted

## 2016-02-20 NOTE — Patient Outreach (Signed)
[facility]) Care Management   02/20/2016  [name] 01-25-1946 [id number]  [name] is an 70 y.o. male  Subjective:  Pt reports being stronger with going to  outpatient rehab, to end 2/24,  Pt reports was told at therapy issue is more with his right knee than his balance, suggested f/u with Orthopedic MD.  Spouse reports  swelling in pt's legs did go down after taking the 3 days of Metolazone,pt watching his salt intake.  Pt reports weights ranging 244-246 lbs , today 246 lbs.  Spouse reports pt to f/u with Heart MD 2/28, Dr. Luan Pulling 3/3.  Objective:   Filed Vitals:   02/20/16 1558  BP: 102/60  Pulse: 54  Resp: 24   ROS  Physical Exam  Constitutional: He is oriented to person, place, and time. He appears well-developed and well-nourished.  Cardiovascular: Regular rhythm.   HR 54.   Respiratory: Effort normal and breath sounds normal.  GI: Soft. Bowel sounds are normal.  Musculoskeletal: Normal range of motion. He exhibits edema.  Trace edema LLE, bilateral ankles. Uses walker when ambulating   Neurological: He is alert and oriented to person, place, and time.  Skin: Skin is warm and dry.  Psychiatric: He has a normal mood and affect. His behavior is normal. Judgment and thought content normal.    Current Medications:  Reviewed with  Spouse  Current Outpatient Prescriptions  Medication Sig Dispense Refill  . bumetanide (BUMEX) 2 MG tablet Take by mouth 2 (two) times daily.    . dabigatran (PRADAXA) 150 MG CAPS capsule Take 150 mg by mouth 2 (two) times daily.    [name] dicyclomine (BENTYL) 10 MG capsule 1 po 30 MINS BEFORE BREAKFAST AND MAY REPEAT BEFORE LUNCH. (Patient taking differently: Take 10 mg by mouth 2 (two) times daily. 1 po 30 MINS BEFORE BREAKFAST AND MAY REPEAT BEFORE LUNCH.) 60 capsule 11  . escitalopram (LEXAPRO) 20 MG tablet Take 1 tablet (20 mg total) by mouth daily. 30 tablet 5  . levETIRAcetam (KEPPRA) 500 MG tablet Take 1  tablet (500 mg total) by mouth 2 (two) times daily. 60 tablet 12  . lisinopril (PRINIVIL,ZESTRIL) 10 MG tablet Take 1 tablet (10 mg total) by mouth daily. 30 tablet 12  . omeprazole (PRILOSEC) 20 MG capsule Take 1 capsule (20 mg total) by mouth daily. 30 capsule 3  . potassium chloride SA (K-DUR,KLOR-CON) 20 MEQ tablet Take 1 tablet (20 mEq total) by mouth daily. (Patient taking differently: Take 20 mEq by mouth 2 (two) times daily. ) 90 tablet 3  . psyllium (METAMUCIL) 58.6 % packet Take 1 packet by mouth daily.    [name] albuterol (PROVENTIL HFA;VENTOLIN HFA) 108 (90 Base) MCG/ACT inhaler Inhale 1-2 puffs into the lungs every 6 (six) hours as needed for wheezing or shortness of breath. Reported on 02/20/2016    . ELIQUIS 5 MG TABS tablet Reported on 02/20/2016  12  . lidocaine-prilocaine (EMLA) cream Apply a quarter size amount to port site 1 hour prior to chemo. Do not rub in. Cover with plastic wrap. (Patient not taking: Reported on 02/20/2016) 30 g 3  . nitroGLYCERIN (NITROGLYN) 2 % ointment Apply 0.5 inches topically as needed for chest pain. Reported on 02/20/2016    . torsemide (DEMADEX) 20 MG tablet Reported on 02/20/2016  5   No current facility-administered medications for this visit.    Functional Status:   In your present state of health, do you have any difficulty performing the following  activities: 12/14/2015 11/06/2015  Hearing? N -  Vision? N -  Difficulty concentrating or making decisions? N -  Walking or climbing stairs? Y -  Dressing or bathing? N -  Doing errands, shopping? N -  Preparing Food and eating ? - Y  Using the Toilet? - N  In the past six months, have you accidently leaked urine? - N  Do you have problems with loss of bowel control? - Y  Managing your Medications? - N  Managing your Finances? - N  Housekeeping or managing your Housekeeping? - Y    Fall/Depression Screening:    PHQ 2/9 Scores 11/06/2015  PHQ - 2 Score 0    Assessment:  HF- As reported, pt's  weight stable, trace edema in left lower leg/bilateral legs.  Lungs clear.                          No c/o sob.    Plan:  Plan to discharge pt from RN CM services, goals met.            Pt to f/u with Heart MD 2/28, Dr Luan Pulling 3/3.             As discussed, pt to report to MD dry cough.              Plan to inform Dr. Luan Pulling in Epic of case closure ( fax letter, route encounter).            Plan to inform [place] management assistant to close case.    [facility] CM Care Plan Problem Two        Most Recent Value   Care Plan Problem Two  HF- issues with bilateral leg edema    Role Documenting the Problem Two  Care Management [place] for Problem Two  Active   Interventions for Problem Two Long Term Goal   Discussed with spouse pt's ongoing compliance with low Na+ diet, elevate legs, benefit of compression stockings.    [facility]N Long Term Goal (31-90) days  Pt's leg edema would be managed with medication in the next 31 days    [facility]N Long Term Goal Start Date  02/02/16   [facility] Long Term Goal Met Date  02/20/16   [facility] CM Short Term Goal #2 (0-30 days)  pt's urine output would increase in the next 7-10 days, no further weight gain    [facility] CM Short Term Goal #2 Start Date  02/06/16   [facility] CM Short Term Goal #2 Met Date  02/13/16 [met= spouse reports resolved in 7 days ]   Interventions for Short Term Goal #2  Reinforced with spouse for pt to continue to weigh, take diurectic as ordered, elevate legs, call MD if needed.       Zara Chess.   [place] Care Management  [phone]

## 2016-02-21 ENCOUNTER — Ambulatory Visit (HOSPITAL_COMMUNITY): Payer: Medicare Other

## 2016-02-21 DIAGNOSIS — R269 Unspecified abnormalities of gait and mobility: Secondary | ICD-10-CM | POA: Diagnosis not present

## 2016-02-21 DIAGNOSIS — R29898 Other symptoms and signs involving the musculoskeletal system: Secondary | ICD-10-CM

## 2016-02-21 DIAGNOSIS — R296 Repeated falls: Secondary | ICD-10-CM

## 2016-02-21 DIAGNOSIS — R6889 Other general symptoms and signs: Secondary | ICD-10-CM

## 2016-02-21 DIAGNOSIS — R262 Difficulty in walking, not elsewhere classified: Secondary | ICD-10-CM

## 2016-02-21 NOTE — Therapy (Signed)
[place], [place], [address] Phone: [phone]   Fax:  [phone]  Physical Therapy Treatment  Patient Details  Name: [name] MRN: [id number] Date of Birth: 16-Apr-1946 Referring Provider: Dr. [name]   Encounter Date: 02/21/2016      PT End of Session - 02/21/16 1107    Visit Number 15   Number of Visits 18   Date for PT Re-Evaluation 03/08/16   Authorization Type Medicare   Authorization Time Period 01/10/2016 to 04/02/16;  30-day PN and G-codes completed on 2016/03/02   Authorization - Visit Number 15   Authorization - Number of Visits 18   PT Start Time 6384   PT Stop Time 1148   PT Time Calculation (min) 43 min   Equipment Utilized During Treatment Gait belt   Activity Tolerance Patient tolerated treatment well   Behavior During Therapy [facility] for tasks assessed/performed      Past Medical History  Diagnosis Date  . Varicose veins   . New onset atrial fibrillation ([place]) 01/02/2015  . Morbid obesity ([place]) 01/02/2015  . Hypertension   . Sleep apnea     been tested but has not received the CPAP yet  . GERD (gastroesophageal reflux disease)   . History of gout   . Cancer ([place]) 02/2015    colon  . Family history of colon cancer   . Family history of breast cancer in mother   . Colon cancer [facility])     March 2016    Past Surgical History  Procedure Laterality Date  . Vein ligation and stripping      left leg  . Colonoscopy N/A 03/28/2015    SLF: 1. Abdominal pain, diarrhea, rectal bleeding due to obstructing colon mass  . Esophagogastroduodenoscopy N/A 03/28/2015    SLF: 1. dysphagia 2. Mild non-erosive gastritis.   . Esophageal dilation N/A 03/28/2015    Procedure: ESOPHAGEAL DILATION;  Surgeon: [name], MD;  Location: [facility];  Service: Endoscopy;  Laterality: N/A;  . Partial colectomy N/A 04/03/2015    Procedure: PARTIAL COLECTOMY;  Surgeon: [name], MD;  Location:  [facility] ORS;  Service: General;  Laterality: N/A;  . Portacath placement N/A 05/10/2015    Procedure: INSERTION PORT-A-CATH;  Surgeon: [name], MD;  Location: [facility] ORS;  Service: General;  Laterality: N/A;  left subclavian  . Skin cancer destruction Left 08/16/15    left side of nose and left back  . Colonoscopy N/A 10/03/2015    SLF: normal anastomosis, fair prep (polyps less than 1cm could be missed), anal fissures and verrucous anal canal lesion with benign biopsy. Next TCS in 09/2016.  . Colonoscopy N/A 10/02/2015    SLF: normal anastomosis, poor bowel prep, three anal fissures    There were no vitals filed for this visit.  Visit Diagnosis:  Abnormality of gait  Leg weakness, bilateral  Difficulty walking  Falls frequently  Decreased functional activity tolerance      Subjective Assessment - 02/21/16 1108    Subjective Pt denied pain at rest, but noted R knee pain while ambulating into the clinic 10 minutes earlier that was rated a 5/10 on a VAS. Pt denied falls since last PT visit. No new complaints reported upon arrival.    Patient is accompained by: Family member   Pertinent History Patient had cancer surgery in april 2016 to removed a18inches of colon, mD beleives they removed all of colon, Patient began Chemo  05/17/15. patient had surgery 20 years ago to "strip veins" and has sicne had swelling in both legs. patint enjos shooting pool, patient states limited physical activity due to bad knee and limited energy levels noting "I can t walk across the road."  "I feel worn out to where i can sit up for a while then have to sit back down and rest.  little pain with sit to stance.    Limitations Walking;Standing   Patient Stated Goals to have have more energy, to be able  to walk further; to get rid of the walker; reduce pain levels of the Rt knee    Currently in Pain? No/denies                         [facility] Adult PT Treatment/Exercise - 02/21/16 0001    Knee/Hip  Exercises: Standing   Functional Squat 2 sets;10 reps;Other (comment)  with UE support   Knee/Hip Exercises: Seated   Long Arc Quad Strengthening;2 sets;15 reps   Long Arc Quad Weight 4 lbs.   Marching Limitations 2 sets of 15 reps with blue thera-band   Hamstring Curl Strengthening;2 sets;15 reps;Both   Hamstring Limitations green thera-band    Abduction/Adduction  Strengthening;Both;3 sets;10 reps;Other (comment)   Sit to Sand 10 reps;without UE support;Other (comment)             Balance Exercises - 02/21/16 1136    Balance Exercises: Standing   Standing Eyes Opened Head turns;Foam/compliant surface;3 reps;Wide (BOA);Other (comment)  x 1 minute each trial   Tandem Stance Eyes open;2 reps;Other (comment);30 secs  bilateral    Other Standing Exercises Unilateral UE funtional reaching across midline, left, and right x 2 sets of 10 reps, bilaterally            PT Education - 02/21/16 1114    Education provided Yes   Education Details Current HEP    Person(s) Educated Patient   Methods Explanation;Demonstration   Comprehension Verbalized understanding;Returned demonstration          PT Short Term Goals - 02/16/16 1132    PT SHORT TERM GOAL #1   Title Pt to be independent  in Hep in order to obtain goals in a timely manner.    Time 3   Period Weeks   Status Achieved   PT SHORT TERM GOAL #2   Title Patient will be able to ambulate 76mnutes without resting in order to progress to better health habits.    Baseline 6/29- patient states he is able to ambulate around 4 minutes before fatiguing    Time 3   Period Weeks   Status On-going   PT SHORT TERM GOAL #3   Title Pt to be able to stand for 10 minutes to be able to complete self grooming activity without fear of falling    Time 3   Period Weeks   Status Achieved   PT SHORT TERM GOAL #4   Title Patient will average > 4000 steps a day   Baseline 6/29- 1500 steps on average    Time 4   Period Weeks   Status  Not Met           PT Long Term Goals - 02/16/16 1134    PT LONG TERM GOAL #1   Title Pt to be Independent  in advance HEP in order to decrease frequency of falling    Time 6   Period Weeks   Status On-going  PT LONG TERM GOAL #2   Title Pt to have not fallen in the past two weeks for reduced risk of injury    Baseline pt sustained a fall 2 weeks ago from 02/07/16   Time 6   Period Weeks   Status On-going   PT LONG TERM GOAL #3   Title Pt to be able to walk for 15 minutes without stopping for better health habits   Time 6   Period Weeks   Status On-going   PT LONG TERM GOAL #4   Title Pt to be able to stand for 15 minutes to be safe in shower    Time 6   Period Weeks   Status On-going   PT LONG TERM GOAL #5   Title Pt strength to be increased by one grade to be able to walk in his home with a cane    Baseline R quad strength improved from 4/5 MMT to 5/5 MMT    Time 6   Period Weeks   Status On-going   PT LONG TERM GOAL #6   Title Patient will improve TUG time to <14 sec in order to reduce the risk for falls with community ambulation.    Baseline Created on 02/07/2016   Time 3   Period Weeks   Status On-going               Plan - 02/21/16 1115    Clinical Impression Statement PT tx focused on B LE strengthening ther ex and standing balance training activities. Good tolerance reported and assessed with resisted LE ther ex without exacerbating R knee pain. Verbal cues required for proper technique and reps with all resisted ther ex. Progressed to squats with B UE support with focus on eccentric control. Pt denied pain during squats, but reported B LE fatigue. Progressed to dynamic standing balance training with addition of head turns while standing on airex pad and functional reaching while standing on airex. Minimal weight shifting assessed with initial trials with head turns and functional reaching. Improved weight shifting assessed once cues provided. Pt remained  pain-free at the end of PT tx. Continue with current POC with focus on B LE strengthening and standing balance activities.    Pt will benefit from skilled therapeutic intervention in order to improve on the following deficits Abnormal gait;Decreased activity tolerance;Decreased balance;Decreased endurance;Decreased strength;Difficulty walking;Pain;Postural dysfunction   Rehab Potential Good   PT Frequency 3x / week   PT Duration 6 weeks   PT Treatment/Interventions Gait training;Functional mobility training;Therapeutic activities;Therapeutic exercise;Balance training;Patient/family education;Neuromuscular re-education   PT Next Visit Plan Reassess in one to two visits.  No single leg activity due to increasing OA pain.    PT Home Exercise Plan Reviewed current HEP with no changes made this visit   Consulted and Agree with Plan of Care Patient        Problem List Patient Active Problem List   Diagnosis Date Noted  . Fatty liver 02/06/2016  . 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 Altered mental status   . Abnormal LFTs   . Fecal incontinence   . Peripheral edema 10/15/2015  . Acute diastolic CHF (congestive heart failure) ([place]) 10/15/2015  . Generalized weakness 10/15/2015  . DVT (deep venous thrombosis) ([place]) 10/15/2015  . Atrial fibrillation ([place]) 10/15/2015  . Change in bowel habits   . Malignant neoplasm of colon ([place])   . Colon cancer ([place])   . Constipation 09/20/2015  . Elevated liver enzymes 09/20/2015  .  Cellulitis 08/04/2015  . Family history of colon cancer   . Family history of breast cancer in mother   . Colon carcinoma ([place]) 04/03/2015  . Occult blood in stools   . Dysphagia, pharyngoesophageal phase   . Melena 03/15/2015  . Heme positive stool 03/15/2015  . Esophageal dysphagia 03/15/2015  . Diarrhea 03/15/2015  . New onset atrial fibrillation ([place]) 01/02/2015  . Morbid obesity ([place]) 01/02/2015  . Varicose veins of lower extremities with other  complications 47/06/6150    Garen Lah, PT, DPT  02/21/2016, 12:01 PM  [facility] [address] [place], [place], [address] Phone: [phone]   Fax:  [phone]  Name: [name] MRN: [id number] Date of Birth: [birth year]

## 2016-02-23 ENCOUNTER — Encounter (HOSPITAL_COMMUNITY): Payer: Medicare Other | Admitting: Physical Therapy

## 2016-02-27 ENCOUNTER — Ambulatory Visit (INDEPENDENT_AMBULATORY_CARE_PROVIDER_SITE_OTHER): Payer: Medicare Other | Admitting: Adult Health

## 2016-02-27 ENCOUNTER — Encounter (HOSPITAL_COMMUNITY): Payer: Self-pay

## 2016-02-27 ENCOUNTER — Ambulatory Visit (HOSPITAL_COMMUNITY): Payer: Medicare Other

## 2016-02-27 ENCOUNTER — Encounter: Payer: Self-pay | Admitting: Adult Health

## 2016-02-27 VITALS — BP 96/58 | HR 90 | Ht 67.0 in | Wt 256.0 lb

## 2016-02-27 DIAGNOSIS — R6889 Other general symptoms and signs: Secondary | ICD-10-CM

## 2016-02-27 DIAGNOSIS — I4819 Other persistent atrial fibrillation: Secondary | ICD-10-CM

## 2016-02-27 DIAGNOSIS — R29898 Other symptoms and signs involving the musculoskeletal system: Secondary | ICD-10-CM

## 2016-02-27 DIAGNOSIS — R269 Unspecified abnormalities of gait and mobility: Secondary | ICD-10-CM | POA: Diagnosis not present

## 2016-02-27 DIAGNOSIS — I5032 Chronic diastolic (congestive) heart failure: Secondary | ICD-10-CM

## 2016-02-27 DIAGNOSIS — R296 Repeated falls: Secondary | ICD-10-CM

## 2016-02-27 DIAGNOSIS — I481 Persistent atrial fibrillation: Secondary | ICD-10-CM | POA: Diagnosis not present

## 2016-02-27 DIAGNOSIS — R262 Difficulty in walking, not elsewhere classified: Secondary | ICD-10-CM

## 2016-02-27 NOTE — Progress Notes (Signed)
Name: [name]    DOB: [birth year]  Age: 70 y.o.  MR#: [id number]       PCP:  [name], MD      Insurance: Payor: MEDICARE / Plan: MEDICARE PART A AND B / Product Type: *No Product type* /   CC:   No chief complaint on file.   VS Filed Vitals:   02/27/16 1409  BP: 96/58  Pulse: 90  Height: 5\' 7"  (1.702 m)  Weight: 256 lb (116.121 kg)  SpO2: 96%    Weights Current Weight  02/27/16 256 lb (116.121 kg)  02/20/16 246 lb (111.585 kg)  02/06/16 260 lb 6.4 oz (118.117 kg)    Blood Pressure  BP Readings from Last 3 Encounters:  02/27/16 96/58  02/20/16 102/60  02/06/16 97/53     Admit date:  (Not on file) Last encounter with RMR:  01/30/2016   Allergy Review of patient's allergies indicates no known allergies.  Current Outpatient Prescriptions  Medication Sig Dispense Refill  . albuterol (PROVENTIL HFA;VENTOLIN HFA) 108 (90 Base) MCG/ACT inhaler Inhale 1-2 puffs into the lungs every 6 (six) hours as needed for wheezing or shortness of breath. Reported on 02/20/2016    . bumetanide (BUMEX) 2 MG tablet Take by mouth 2 (two) times daily.    . dabigatran (PRADAXA) 150 MG CAPS capsule Take 150 mg by mouth 2 (two) times daily.    [name] dicyclomine (BENTYL) 10 MG capsule 1 po 30 MINS BEFORE BREAKFAST AND MAY REPEAT BEFORE LUNCH. (Patient taking differently: Take 10 mg by mouth 2 (two) times daily. 1 po 30 MINS BEFORE BREAKFAST AND MAY REPEAT BEFORE LUNCH.) 60 capsule 11  . ELIQUIS 5 MG TABS tablet Reported on 02/20/2016  12  . escitalopram (LEXAPRO) 20 MG tablet Take 1 tablet (20 mg total) by mouth daily. 30 tablet 5  . levETIRAcetam (KEPPRA) 500 MG tablet Take 1 tablet (500 mg total) by mouth 2 (two) times daily. 60 tablet 12  . lidocaine-prilocaine (EMLA) cream Apply a quarter size amount to port site 1 hour prior to chemo. Do not rub in. Cover with plastic wrap. 30 g 3  . lisinopril (PRINIVIL,ZESTRIL) 10 MG tablet Take 1 tablet (10 mg total) by mouth daily. 30 tablet 12  .  nitroGLYCERIN (NITROGLYN) 2 % ointment Apply 0.5 inches topically as needed for chest pain. Reported on 02/20/2016    . omeprazole (PRILOSEC) 20 MG capsule Take 1 capsule (20 mg total) by mouth daily. 30 capsule 3  . potassium chloride SA (K-DUR,KLOR-CON) 20 MEQ tablet Take 1 tablet (20 mEq total) by mouth daily. (Patient taking differently: Take 20 mEq by mouth 2 (two) times daily. ) 90 tablet 3  . psyllium (METAMUCIL) 58.6 % packet Take 1 packet by mouth daily.    [name] torsemide (DEMADEX) 20 MG tablet Reported on 02/20/2016  5   No current facility-administered medications for this visit.    Discontinued Meds:   There are no discontinued medications.  Patient Active Problem List   Diagnosis Date Noted  . Fatty liver 02/06/2016  . 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 Altered mental status   . Abnormal LFTs   . Fecal incontinence   . Peripheral edema 10/15/2015  . Acute diastolic CHF (congestive heart failure) ([place]) 10/15/2015  . Generalized weakness 10/15/2015  . DVT (deep venous thrombosis) ([place]) 10/15/2015  . Atrial fibrillation ([place]) 10/15/2015  . Change in bowel habits   . Malignant neoplasm of colon ([place])   . Colon cancer (  [place])   . Constipation 09/20/2015  . Elevated liver enzymes 09/20/2015  . Cellulitis 08/04/2015  . Family history of colon cancer   . Family history of breast cancer in mother   . Colon carcinoma ([place]) 04/03/2015  . Occult blood in stools   . Dysphagia, pharyngoesophageal phase   . Melena 03/15/2015  . Heme positive stool 03/15/2015  . Esophageal dysphagia 03/15/2015  . Diarrhea 03/15/2015  . New onset atrial fibrillation ([place]) 01/02/2015  . Morbid obesity ([place]) 01/02/2015  . Varicose veins of lower extremities with other complications [id number]    LABS    Component Value Date/Time   NA 139 01/14/2016 1200   NA 138 12/27/2015 1038   NA 135 12/14/2015 1813   K 4.3 01/14/2016 1200   K 3.7 12/27/2015 1038   K 4.3 12/14/2015 1813    CL 103 01/14/2016 1200   CL 103 12/27/2015 1038   CL 103 12/14/2015 1813   CO2 29 01/14/2016 1200   CO2 30 12/27/2015 1038   CO2 28 12/14/2015 1813   GLUCOSE 106* 01/14/2016 1200   GLUCOSE 98 12/27/2015 1038   GLUCOSE 112* 12/14/2015 1813   BUN 14 01/14/2016 1200   BUN 14 12/27/2015 1038   BUN 14 12/14/2015 1813   CREATININE 0.94 01/14/2016 1200   CREATININE 0.85 12/27/2015 1038   CREATININE 0.89 12/14/2015 1813   CALCIUM 8.8* 01/14/2016 1200   CALCIUM 8.9 12/27/2015 1038   CALCIUM 8.7* 12/14/2015 1813   GFRNONAA >60 01/14/2016 1200   GFRNONAA >60 12/27/2015 1038   GFRNONAA >60 12/14/2015 1813   GFRAA >60 01/14/2016 1200   GFRAA >60 12/27/2015 1038   GFRAA >60 12/14/2015 1813   CMP     Component Value Date/Time   NA 139 01/14/2016 1200   K 4.3 01/14/2016 1200   CL 103 01/14/2016 1200   CO2 29 01/14/2016 1200   GLUCOSE 106* 01/14/2016 1200   BUN 14 01/14/2016 1200   CREATININE 0.94 01/14/2016 1200   CALCIUM 8.8* 01/14/2016 1200   PROT 6.5 01/14/2016 1200   ALBUMIN 2.9* 01/14/2016 1200   AST 44* 01/14/2016 1200   ALT 19 01/14/2016 1200   ALKPHOS 115 01/14/2016 1200   BILITOT 1.5* 01/14/2016 1200   GFRNONAA >60 01/14/2016 1200   GFRAA >60 01/14/2016 1200       Component Value Date/Time   WBC 5.7 01/14/2016 1200   WBC 5.5 12/14/2015 1813   WBC 6.2 12/12/2015 1251   HGB 13.2 01/14/2016 1200   HGB 12.2* 12/14/2015 1813   HGB 11.5* 12/12/2015 1251   HCT 37.7* 01/14/2016 1200   HCT 35.0* 12/14/2015 1813   HCT 32.4* 12/12/2015 1251   MCV 97.7 01/14/2016 1200   MCV 97.8 12/14/2015 1813   MCV 97.0 12/12/2015 1251    Lipid Panel     Component Value Date/Time   CHOL 113 01/03/2015 0631   TRIG 104 01/03/2015 0631   HDL 37* 01/03/2015 0631   CHOLHDL 3.1 01/03/2015 0631   VLDL 21 01/03/2015 0631   LDLCALC 55 01/03/2015 0631    ABG    Component Value Date/Time   PHART 7.512* 10/19/2015 0750   PCO2ART 36.9 10/19/2015 0750   PO2ART 94.6 10/19/2015 0750    HCO3 30.2* 10/19/2015 0750   O2SAT 97.7 10/19/2015 0750     Lab Results  Component Value Date   TSH 3.085 10/17/2015   BNP (last 3 results)  Recent Labs  12/14/15 1813 12/27/15 1039 01/14/16 1200  BNP 239.0* 254.0* 256.0*  ProBNP (last 3 results) No results for input(s): PROBNP in the last 8760 hours.  Cardiac Panel (last 3 results) No results for input(s): CKTOTAL, CKMB, TROPONINI, RELINDX in the last 72 hours.  Iron/TIBC/Ferritin/ %Sat    Component Value Date/Time   IRON 67 04/27/2015 1649   TIBC 263 04/27/2015 1649   FERRITIN 298 09/19/2015 1044   IRONPCTSAT 25 04/27/2015 1649     EKG Orders placed or performed during the hospital encounter of 01/14/16  . EKG     Prior Assessment and Plan Problem List as of 02/27/2016      Cardiovascular and Mediastinum   Varicose veins of lower extremities with other complications   New onset atrial fibrillation [facility])   Last Assessment & Plan 01/17/2015 Office Visit Edited 01/17/2015  2:50 PM by Lendon Colonel, NP    He is not on any AV nodal blocking agents as his heart rate is well controlled. He does not have any melena or over bleeding but has noticed that his stool has become darker. I will have a hemoccult of the stool completed for evidence of blood. Eliquis has low risk for GI bleeding but with changes in stool color, will check   I will also have him scheduled for sleep study to evaluate for OSA. He has the body habitus for this. This can be etiology of atrial fib as he was ruled out for ischemia y NM stress test on 01/11/2015. He will be referred to Dr. Luan Pulling once test is completed, if positive,  for further management      Acute diastolic CHF (congestive heart failure) ([place])   DVT (deep venous thrombosis) ([facility])   Atrial fibrillation [facility])     Digestive   Melena   Esophageal dysphagia   Dysphagia, pharyngoesophageal phase   Colon carcinoma [facility])   Last Assessment & Plan 09/20/2015 Office Visit Written  09/20/2015  9:52 AM by [name], MD    S/P CHEMO/NO XRT. INCOMPLETE TCS INMAR 2016.  COMPLETE TCS OCT 3-SUPREP SAMPLE GIVEN. DISCUSSED PROCEDURE, BENEFITS, & RISKS: < 1% chance of medication reaction, bleeding, perforation, or rupture of spleen/liver.      Constipation   Last Assessment & Plan 09/20/2015 Office Visit Edited 09/20/2015  2:10 PM by [name], MD    SYMPTOMS NOT CONTROLLED AND LIKELY DUE TO DECREASED PO INTAKE, LESS LIKELY RECURRENT RECTAL CANCER, OR ANASTOMOTIC STRICTURE.  DRINK WATER TO KEEP YOUR URINE LIGHT YELLOW. FOLLOW A LOW FAT DIET. MEATS SHOULD BE BAKED, BROILED, OR BOILED. AVOID FRIED FOODS. SEE INFO BELOW. USE FIBER POWDER 1 PACKET ONCE DAILY FOR 3 DAYS THEN TWICE DAILY FOR 3 DAYS THEN THREE TIMES A DAY. TAKE SUPREP OCT 3 AND FOLLOW A CLEAR LIQUID DIET. YOU MAY HAVE CREAMY SOUPS WITHOUT CHUNKS OF FOOD, SCRAMBLED EGGS OR MASHED POTATOES IF YOU GET HUNGRY. WILL DISCUSS HOW LONG TO HOLD ELIQUIS WITH CARDIOLOGY-24 OR 48 HRS. COMPLETE COLONOSCOPY OCT 3. RESCHEDULE  MRI.  FOLLOW UP IN 6 MOS.          Colon cancer [facility])   Malignant neoplasm of colon [facility])   Last Assessment & Plan 02/06/2016 Office Visit Written 02/13/2016 12:22 PM by Orvil Feil, NP    Due for surveillance Oct 2017. Return in Sept to arrange.       Fatty liver   Last Assessment & Plan 02/06/2016 Office Visit Written 02/13/2016 12:21 PM by Orvil Feil, NP    70 year old with fatty liver. Check elastography for baseline. LFTs  every 6 months. Return in Sept 2017.         Nervous and Auditory   Spondylosis, cervical, with myelopathy     Other   Morbid obesity [facility])   Last Assessment & Plan 01/17/2015 Office Visit Written 01/17/2015  2:51 PM by Lendon Colonel, NP    If he indeed does have OSA, wearing CPAP at night may help him to increase his energy, therefore increasing his activity. I would like to see him on an exercise program, to increase his stamina and lose weight.       Heme  positive stool   Last Assessment & Plan 03/15/2015 Office Visit Written 03/15/2015  1:11 PM by [name], PA-C    70 y/o male with h/o "black" stool with dark red blood several weeks ago while on Eliquis. Subsequently has returned multiple positive hemoccults. Eliquis held pending GI work up. He takes ibuprofen regularly making PUD a possibility. Recommend EGD+/-ED in the near future. He has some vague esophageal dysphagia.  I have discussed the risks, alternatives, benefits with regards to but not limited to the risk of reaction to medication, bleeding, infection, perforation and the patient is agreeable to proceed. Written consent to be obtained.  Start omeprazole 20mg  daily empirically for PUD. Can stop if EGD unremarkable.      Diarrhea   Last Assessment & Plan 03/15/2015 Office Visit Written 03/15/2015  1:12 PM by [name], PA-C    Several week h/o diarrhea appears to have occurred since his hospitalization. Will check for CDiff. Plan for first ever colonoscopy for h/o heme + stool and FH of colon cancer.  I have discussed the risks, alternatives, benefits with regards to but not limited to the risk of reaction to medication, bleeding, infection, perforation and the patient is agreeable to proceed. Written consent to be obtained.        Occult blood in stools   Family history of colon cancer   Family history of breast cancer in mother   Cellulitis   Elevated liver enzymes   Last Assessment & Plan 09/20/2015 Office Visit Written 09/20/2015  9:54 AM by [name], MD    NEW FINDING ON EVALUATION-RECENT ECHO/CT ABD: FATTY LIVER, NL RA/RV, MILDLY ELEVATED PA PRESSURE.  COMPLETE SEROLOGIES FOR AIH/PBC.      Change in bowel habits   Peripheral edema   Generalized weakness   Abnormal LFTs   Fecal incontinence   Altered mental status       Imaging: US Abdomen Complete W/elastography  02/15/2016  CLINICAL DATA:  Fatty liver for 20 years. Previous colectomy. Morbid obesity,  AFib. History of colon cancer. EXAM: ULTRASOUND ABDOMEN COMPLETE ULTRASOUND HEPATIC ELASTOGRAPHY TECHNIQUE: Sonography of the upper abdomen was performed. In addition, ultrasound elastography evaluation of the liver was performed. A region of interest was placed within the right lobe of the liver. Following application of a compressive sonographic pulse, shear waves were detected in the adjacent hepatic tissue and the shear wave velocity was calculated. Multiple assessments were performed at the selected site. Median shear wave velocity is correlated to a Metavir fibrosis score. COMPARISON:  CT 09/18/2015 FINDINGS: ULTRASOUND ABDOMEN Gallbladder: Gallbladder has a normal appearance. Gallbladder wall is 3.0 mm, within normal limits. No stones or pericholecystic fluid. No sonographic Murphy's sign. Common bile duct: Diameter: 5.0 mm Liver: The liver is echogenic. There is attenuation of the ultrasound wave, poor visualization of the internal hepatic architecture, and loss of definition of the diaphragm. No focal  liver lesions are identified. IVC: No abnormality visualized. Pancreas: Not well seen because of overlying bowel gas. Spleen: Size and appearance within normal limits. Right Kidney: Length: 10.0 cm. Echogenicity within normal limits. No mass or hydronephrosis visualized. Left Kidney: Length: 11.2 cm. Echogenicity within normal limits. No mass or hydronephrosis visualized. Abdominal aorta: Partially obscured by bowel gas. Visualized portion is not aneurysmal. Other findings: None. ULTRASOUND HEPATIC ELASTOGRAPHY Device: Siemens Helix VTQ Patient position: Supine Transducer 4V1 Number of measurements:  10 Hepatic Segment:  8 Median velocity:   3.27  m/sec IQR: 0.66 IQR/Median velocity ratio 0.20 Corresponding Metavir fibrosis score:  Some F3 and F4 Risk of fibrosis: High Limitations of exam: Large patient body habitus Pertinent findings noted on other imaging exams:  None Please note that abnormal shear wave  velocities may also be identified in clinical settings other than with hepatic fibrosis, such as: acute hepatitis, elevated right heart and central venous pressures including use of beta blockers, veno-occlusive disease (Budd-Chiari), infiltrative processes such as mastocytosis/amyloidosis/infiltrative tumor, extrahepatic cholestasis, in the post-prandial state, and liver transplantation. Correlation with patient history, laboratory data, and clinical condition recommended. IMPRESSION: 1. Hepatic steatosis. 2. No evidence for acute cholecystitis. 3. Portions of the abdomen are obscured bowel gas. 4. Median hepatic shear wave velocity is calculated at 3.27 m/sec. 5. Corresponding Metavir fibrosis score is some F3 and F4. 6. Risk of fibrosis is high. 7. Follow-up:  Follow-up is advised. Electronically Signed   By: Nolon Nations M.D.   On: 02/15/2016 11:59

## 2016-02-27 NOTE — Progress Notes (Signed)
Quick Note:  Pt and wife aware of the information. ______

## 2016-02-27 NOTE — Progress Notes (Signed)
Quick Note:  Elastography with F3/F4 score. He could have underlying cirrhosis. Would recommend EGD at time of colonoscopy in Oct 2017. For now, likely secondary to fatty liver and would not order additional serologies unless changes in LFTs. Will repeat LFTs in October as well.   Instructions for fatty liver: Recommend 1-2# weight loss per week until ideal body weight through exercise & diet. Low fat/cholesterol diet.  Avoid sweets, sodas, fruit juices, sweetened beverages like tea, etc. Gradually increase exercise from 15 min daily up to 1 hr per day 5 days/week. Limit alcohol use.   ______

## 2016-02-27 NOTE — Therapy (Signed)
[place], [place], [address] Phone: [phone]   Fax:  [phone]  Physical Therapy Treatment/ Physical Therapy Reassessment  Patient Details  Name: [name] MRN: [id number] Date of Birth: 23-Aug-1946 Referring Provider: Dr. [name]   Encounter Date: 03-12-2016      PT End of Session - 03-12-16 1648    Visit Number 16   Number of Visits 28   Date for PT Re-Evaluation 03/28/16   Authorization Type Medicare   Authorization Time Period 03/12/2016 to 04/23/2016; Reassessment and G-codes updated on Mar 12, 2016    Authorization - Visit Number 16   Authorization - Number of Visits 28   PT Start Time [id number]   PT Stop Time 1735   PT Time Calculation (min) 48 min   Equipment Utilized During Treatment Gait belt   Activity Tolerance Patient tolerated treatment well   Behavior During Therapy [facility] for tasks assessed/performed      Past Medical History  Diagnosis Date  . Varicose veins   . New onset atrial fibrillation ([place]) 01/02/2015  . Morbid obesity ([place]) 01/02/2015  . Hypertension   . Sleep apnea     been tested but has not received the CPAP yet  . GERD (gastroesophageal reflux disease)   . History of gout   . Cancer ([place]) 02/2015    colon  . Family history of colon cancer   . Family history of breast cancer in mother   . Colon cancer [facility])     March 2016    Past Surgical History  Procedure Laterality Date  . Vein ligation and stripping      left leg  . Colonoscopy N/A 03/28/2015    SLF: 1. Abdominal pain, diarrhea, rectal bleeding due to obstructing colon mass  . Esophagogastroduodenoscopy N/A 03/28/2015    SLF: 1. dysphagia 2. Mild non-erosive gastritis.   . Esophageal dilation N/A 03/28/2015    Procedure: ESOPHAGEAL DILATION;  Surgeon: [name], MD;  Location: [facility] ENDO SUITE;  Service: Endoscopy;  Laterality: N/A;  . Partial colectomy N/A 04/03/2015    Procedure: PARTIAL COLECTOMY;  Surgeon:  [name], MD;  Location: [facility] ORS;  Service: General;  Laterality: N/A;  . Portacath placement N/A 05/10/2015    Procedure: INSERTION PORT-A-CATH;  Surgeon: [name], MD;  Location: [facility] ORS;  Service: General;  Laterality: N/A;  left subclavian  . Skin cancer destruction Left 08/16/15    left side of nose and left back  . Colonoscopy N/A 10/03/2015    SLF: normal anastomosis, fair prep (polyps less than 1cm could be missed), anal fissures and verrucous anal canal lesion with benign biopsy. Next TCS in 09/2016.  . Colonoscopy N/A 10/02/2015    SLF: normal anastomosis, poor bowel prep, three anal fissures    There were no vitals filed for this visit.  Visit Diagnosis:  Abnormality of gait - Plan: PT plan of care cert/re-cert  Leg weakness, bilateral - Plan: PT plan of care cert/re-cert  Difficulty walking - Plan: PT plan of care cert/re-cert  Falls frequently - Plan: PT plan of care cert/re-cert  Decreased functional activity tolerance - Plan: PT plan of care cert/re-cert      Subjective Assessment - 03/12/2016 1748    Subjective [name] reported an 80% improvement since beginning with PT with improved strength, less severe R knee pain, and improved ability to stand for longer periods of time. Pt reported R knee pain upon arrival that was  rated a 6/10 on a VAS. Pt denied falls since last PT visit and noted that he feels steadier on his feet. He continues to c/o R knee pain and unsteady balance.    Patient is accompained by: Family member   Pertinent History Patient had cancer surgery in april 2016 to removed a18inches of colon, mD beleives they removed all of colon, Patient began Chemo 05/17/15. patient had surgery 20 years ago to "strip veins" and has sicne had swelling in both legs. patint [place] shooting pool, patient states limited physical activity due to bad knee and limited energy levels noting "I can t walk across the road."  "I feel worn out to where i can sit up for a  while then have to sit back down and rest.  little pain with sit to stance.    Limitations Walking;Standing   How long can you sit comfortably? 02/27/2016=no limitations   How long can you stand comfortably? 02/27/2016=13 minutes    How long can you walk comfortably? 02/27/2016= 8 minutes    Patient Stated Goals to improve overall balance, improve B hip strength, and reduce R knee pain    Currently in Pain? Yes   Pain Score 5    Pain Location Knee   Pain Orientation Right   Pain Descriptors / Indicators Aching;Sore   Pain Type Chronic pain   Pain Onset More than a month ago   Pain Frequency Intermittent   Aggravating Factors  WB activities    Pain Relieving Factors sitting    Effect of Pain on Daily Activities difficulty with long distance ambulation and standing ADLs            [facility] PT Assessment - 02/27/16 0001    Assessment   Medical Diagnosis balance disorder   Referring Provider Dr. [name]    Onset Date/Surgical Date 10/19/15   Hand Dominance Right   Next MD Visit 03/01/2016    Precautions   Precautions Fall   Restrictions   Weight Bearing Restrictions No   Balance Screen   Has the patient fallen in the past 6 months Yes   How many times? no additional falls since last PN   Has the patient had a decrease in activity level because of a fear of falling?  Yes   Is the patient reluctant to leave their home because of a fear of falling?  Yes   Observation/Other Assessments   Focus on Therapeutic Outcomes (FOTO)  53 %( 47% limited)_   Functional Tests   Functional tests Single leg stance;Sit to Stand   Single Leg Stance   Comments SLS (L/R)= 1 sec/ 0 sec   Sit to Stand   Comments 5 sit to stand= 16.60 sec    Posture/Postural Control   Posture/Postural Control Postural limitations   Postural Limitations Rounded Shoulders;Forward head;Increased thoracic kyphosis;Flexed trunk   Strength   Right Hip Flexion 4/5   Right Hip Extension 3/5   Right Hip ABduction 4-/5    Left Hip Flexion 4+/5   Left Hip Extension 3/5   Left Hip ABduction 4-/5   Right Knee Flexion 5/5   Right Knee Extension 5/5   Left Knee Flexion 5/5   Left Knee Extension 5/5   Right Ankle Dorsiflexion 5/5   Left Ankle Dorsiflexion 5/5   Berg Balance Test   Sit to Stand Able to stand without using hands and stabilize independently   Standing Unsupported Able to stand 2 minutes with supervision   Sitting with Back Unsupported  but Feet Supported on Floor or Stool Able to sit safely and securely 2 minutes   Stand to Sit Sits safely with minimal use of hands   Transfers Able to transfer safely, definite need of hands   Standing Unsupported with Eyes Closed Able to stand 10 seconds with supervision   Standing Ubsupported with Feet Together Able to place feet together independently and stand for 1 minute with supervision   From Standing, Reach Forward with Outstretched Arm Can reach forward >12 cm safely (5")   From Standing Position, Pick up Object from [place] to pick up shoe, needs supervision   From Standing Position, Turn to Look Behind Over each Shoulder Looks behind one side only/other side shows less weight shift   Turn 360 Degrees Needs close supervision or verbal cueing   Standing Unsupported, Alternately Place Feet on Step/Stool Able to complete 4 steps without aid or supervision   Standing Unsupported, One Foot in [place] to take small step independently and hold 30 seconds   Standing on One Leg Unable to try or needs assist to prevent fall   Total Score 38   Timed Up and Go Test   Normal TUG (seconds) 18  FWW                     [facility] Adult PT Treatment/Exercise - 02/27/16 0001    Knee/Hip Exercises: Seated   Sit to Sand without UE support;Other (comment);10 reps;2 sets             Balance Exercises - 02/27/16 1803    Balance Exercises: Standing   Standing Eyes Opened Head turns;3 reps;Other (comment);Solid surface;Narrow base of support (BOS)    Tandem Stance Eyes open;2 reps;Other (comment);30 secs   Partial Tandem Stance Eyes open;Other (comment)  with functional reaching across midline x 2 sets of 10 reps    Sidestepping Other (comment)  4 sets of 8 steps, bilaterally, with red TB around thighs           PT Education - 02/27/16 1727    Education provided Yes   Education Details Current HEP,  PT reassessment findings, and 5/5 fall precautions    Person(s) Educated Patient;Spouse   Methods Explanation;Demonstration   Comprehension Verbalized understanding          PT Short Term Goals - 02/27/16 1729    PT SHORT TERM GOAL #1   Title Pt to be independent  in Hep in order to obtain goals in a timely manner.    Time 3   Period Weeks   Status Achieved   PT SHORT TERM GOAL #2   Title Patient will be able to ambulate 62minutes without resting in order to progress to better health habits.    Time 3   Period Weeks   Status On-going   PT SHORT TERM GOAL #3   Title Pt to be able to stand for 10 minutes to be able to complete self grooming activity without fear of falling    Time 3   Period Weeks   Status Achieved           PT Long Term Goals - 02/27/16 1730    PT LONG TERM GOAL #1   Title Pt to be Independent  in advance HEP in order to decrease frequency of falling    Time 6   Period Weeks   Status On-going   PT LONG TERM GOAL #2   Title Pt to have not fallen in  the past two weeks for reduced risk of injury    Baseline Pt denied falls since his last PT progress note    Time 6   Period Weeks   Status Achieved   PT LONG TERM GOAL #3   Title Pt to be able to walk for 15 minutes without stopping for better health habits   Baseline Not formally assessed in the clinic; per pt report, he is able to ambulat ~8 minutes before resting    Time 6   Period Weeks   Status On-going   PT LONG TERM GOAL #4   Title Pt to be able to stand for 15 minutes to be safe in shower    Time 6   Period Weeks   Status On-going    PT LONG TERM GOAL #5   Title Pt will demo improved B hip strength will improve to 4/5 MMT in order to be able to walk in his home.    Baseline Goal updated    Time 6   Period Weeks   Status On-going   Additional Long Term Goals   Additional Long Term Goals Yes   PT LONG TERM GOAL #6   Title Patient will improve TUG time to <14 sec in order to reduce the risk for falls with community ambulation.    Baseline 18 sec with FWW   Time 3   Period Weeks   Status On-going   PT LONG TERM GOAL #7   Title Patient will improve Berg balance score to >45/56 in order to reduce the risk for falls with household ambulation and with ADLs.    Baseline Created 02/27/2016= 38/56    Time 6   Period Weeks   Status New               Plan - 02/27/16 1659    Clinical Impression Statement [name] is a 70 yo male who has been seen for a total of 16 skilled PT visits with initial complaints of unsteady gait and balance. The pt is making steady progress towards stated PT goals with improved B hip and knee strength and improved standing tolerance. No changes assessed with TUG time (18 sec with FWW) or repeated 5 sit to stand test (16.60 sec) since last progress note on 02/07/2016. However, R knee strength further improved from 4+/5 to 5/5 MMT. Pt continues to present with balance deficits and B hip weakness with main deficits assessed with hip extensors and abductors. Berg balance test was completed this visit, which indicated a risk for falls (38/56). Goals continue to be appropriate for desired outcomes with a Berg balance goal created this visit. Pt is in agreement with continued skilled PT, but requested to decrease his frequency form 3x/wk to 2x/wk. The pt would continue to benefit from skilled PT for an additional 2x/week for 6 weeks in order to further address current strength and balance deficits. HEP was reviewed this visit with good understanding verbalized and demo.    Pt will benefit from skilled  therapeutic intervention in order to improve on the following deficits Abnormal gait;Decreased activity tolerance;Decreased balance;Decreased endurance;Decreased strength;Difficulty walking;Pain;Postural dysfunction   Rehab Potential Good   PT Frequency 2x / week   PT Duration 6 weeks   PT Treatment/Interventions Gait training;Functional mobility training;Therapeutic activities;Therapeutic exercise;Balance training;Patient/family education;Neuromuscular re-education;Moist Heat;Cryotherapy;Passive range of motion;Manual techniques;DME Instruction;ADLs/Self Care Home Management   PT Next Visit Plan Focus on seated hip strengthening ther ex with TB and static/dynamic standing balance activities.  PT Home Exercise Plan Reviewed current HEP with no changes made this visit   Recommended Other Services None at this time    Consulted and Agree with Plan of Care Patient;Family member/caregiver   Family Member Consulted Spouse           G-Codes - February 29, 2016 1919    Functional Assessment Tool Used Foto and clinical findings including Berg balance score, MMT, pain, and functional mobility    Functional Limitation Mobility: Walking and moving around   Mobility: Walking and Moving Around Current Status [phone]) At least 40 percent but less than 60 percent impaired, limited or restricted   Mobility: Walking and Moving Around Goal Status [phone]) At least 40 percent but less than 60 percent impaired, limited or restricted      Problem List Patient Active Problem List   Diagnosis Date Noted  . Fatty liver 02/06/2016  . 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 Altered mental status   . Abnormal LFTs   . Fecal incontinence   . Peripheral edema 10/15/2015  . Acute diastolic CHF (congestive heart failure) ([place]) 10/15/2015  . Generalized weakness 10/15/2015  . DVT (deep venous thrombosis) ([place]) 10/15/2015  . Atrial fibrillation ([place]) 10/15/2015  . Change in bowel habits   . Malignant  neoplasm of colon ([place])   . Colon cancer ([place])   . Constipation 09/20/2015  . Elevated liver enzymes 09/20/2015  . Cellulitis 08/04/2015  . Family history of colon cancer   . Family history of breast cancer in mother   . Colon carcinoma ([place]) 04/03/2015  . Occult blood in stools   . Dysphagia, pharyngoesophageal phase   . Melena 03/15/2015  . Heme positive stool 03/15/2015  . Esophageal dysphagia 03/15/2015  . Diarrhea 03/15/2015  . New onset atrial fibrillation ([place]) 01/02/2015  . Morbid obesity ([place]) 01/02/2015  . Varicose veins of lower extremities with other complications [id number]    Garen Lah, PT, DPT   02-29-16, 7:20 PM  [place], [place], [address] Phone: [phone]   Fax:  [phone]  Name: [name] MRN: [id number] Date of Birth: 1946/03/31

## 2016-02-27 NOTE — Patient Instructions (Signed)
Your physician recommends that you schedule a follow-up appointment in: 4 Months with Dr. Harl Bowie  Your physician recommends that you continue on your current medications as directed. Please refer to the Current Medication list given to you today.  If you need a refill on your cardiac medications before your next appointment, please call your pharmacy.  Thank you for choosing [facility]!

## 2016-02-27 NOTE — Progress Notes (Signed)
Cardiology Office Note   Date:  02/27/2016   ID:  Beasley, Peter 1946/05/17, MRN [id number]  PCP:  [name], MD  Cardiologist: Cloria Spring, NP   No chief complaint on file.     History of Present Illness: [name] is a 70 y.o. male who presents for ongoing assessment and management of diastolic CHF, history of DVT in his right leg, on anticoagulation, atrial fibrillation.he comes today with complaints of a chronic cough, which usually affects and at night.  He is seeing Dr. Luan Pulling, has had medication changes to include discontinuation of torsemide and ELIQUIS, he has been placed on per DAX and Bumex instead.  Also, Dr. Luan Pulling, gave him a prescription for metolazone 5 mg, which she was to take for 3 days and then stop.  He is due to see Dr. Luan Pulling in 3 days for followup labs.  He denies any complaints of bleeding, excessive bruising, or lower extremity edema.  His main complaint is cough.    Past Medical History  Diagnosis Date  . Varicose veins   . New onset atrial fibrillation ([place]) 01/02/2015  . Morbid obesity ([place]) 01/02/2015  . Hypertension   . Sleep apnea     been tested but has not received the CPAP yet  . GERD (gastroesophageal reflux disease)   . History of gout   . Cancer ([place]) 02/2015    colon  . Family history of colon cancer   . Family history of breast cancer in mother   . Colon cancer [facility])     March 2016    Past Surgical History  Procedure Laterality Date  . Vein ligation and stripping      left leg  . Colonoscopy N/A 03/28/2015    SLF: 1. Abdominal pain, diarrhea, rectal bleeding due to obstructing colon mass  . Esophagogastroduodenoscopy N/A 03/28/2015    SLF: 1. dysphagia 2. Mild non-erosive gastritis.   . Esophageal dilation N/A 03/28/2015    Procedure: ESOPHAGEAL DILATION;  Surgeon: [name], MD;  Location: [facility] ENDO SU[facility];  Service: Endoscopy;  Laterality: N/A;  . Partial colectomy N/A 04/03/2015    Procedure:  PARTIAL COLECTOMY;  Surgeon: [name], MD;  Location: [facility] ORS;  Service: General;  Laterality: N/A;  . Portacath placement N/A 05/10/2015    Procedure: INSERTION PORT-A-CATH;  Surgeon: [name], MD;  Location: [facility] ORS;  Service: General;  Laterality: N/A;  left subclavian  . Skin cancer destruction Left 08/16/15    left side of nose and left back  . Colonoscopy N/A 10/03/2015    SLF: normal anastomosis, fair prep (polyps less than 1cm could be missed), anal fissures and verrucous anal canal lesion with benign biopsy. Next TCS in 09/2016.  . Colonoscopy N/A 10/02/2015    SLF: normal anastomosis, poor bowel prep, three anal fissures     Current Outpatient Prescriptions  Medication Sig Dispense Refill  . albuterol (PROVENTIL HFA;VENTOLIN HFA) 108 (90 Base) MCG/ACT inhaler Inhale 1-2 puffs into the lungs every 6 (six) hours as needed for wheezing or shortness of breath. Reported on 02/20/2016    . bumetanide (BUMEX) 2 MG tablet Take by mouth 2 (two) times daily.    . dabigatran (PRADAXA) 150 MG CAPS capsule Take 150 mg by mouth 2 (two) times daily.    [name] dicyclomine (BENTYL) 10 MG capsule 1 po 30 MINS BEFORE BREAKFAST AND MAY REPEAT BEFORE LUNCH. (Patient taking differently: Take 10 mg by mouth 2 (two) times daily.  1 po 30 MINS BEFORE BREAKFAST AND MAY REPEAT BEFORE LUNCH.) 60 capsule 11  . ELIQUIS 5 MG TABS tablet Reported on 02/20/2016  12  . escitalopram (LEXAPRO) 20 MG tablet Take 1 tablet (20 mg total) by mouth daily. 30 tablet 5  . levETIRAcetam (KEPPRA) 500 MG tablet Take 1 tablet (500 mg total) by mouth 2 (two) times daily. 60 tablet 12  . lidocaine-prilocaine (EMLA) cream Apply a quarter size amount to port site 1 hour prior to chemo. Do not rub in. Cover with plastic wrap. 30 g 3  . lisinopril (PRINIVIL,ZESTRIL) 10 MG tablet Take 1 tablet (10 mg total) by mouth daily. 30 tablet 12  . nitroGLYCERIN (NITROGLYN) 2 % ointment Apply 0.5 inches topically as needed for chest pain. Reported  on 02/20/2016    . omeprazole (PRILOSEC) 20 MG capsule Take 1 capsule (20 mg total) by mouth daily. 30 capsule 3  . potassium chloride SA (K-DUR,KLOR-CON) 20 MEQ tablet Take 1 tablet (20 mEq total) by mouth daily. (Patient taking differently: Take 20 mEq by mouth 2 (two) times daily. ) 90 tablet 3  . psyllium (METAMUCIL) 58.6 % packet Take 1 packet by mouth daily.    [name] torsemide (DEMADEX) 20 MG tablet Reported on 02/20/2016  5   No current facility-administered medications for this visit.    Allergies:   Review of patient's allergies indicates no known allergies.    Social History:  The patient  reports that he has never smoked. He has never used smokeless tobacco. He reports that he does not drink alcohol or use illicit drugs.   Family History:  The patient's family history includes Breast cancer in his mother; Cancer in his maternal aunt, paternal grandmother, and sister; Colon cancer in his mother; Diabetes in his brother and sister; Heart disease in his father and sister; Hyperlipidemia in his father; Liver cancer in his father; Lung cancer in his paternal uncle.    ROS: All other systems are reviewed and negative. Unless otherwise mentioned in H&P    PHYSICAL EXAM: VS:  BP 96/58 mmHg  Pulse 90  Ht 5\' 7"  (1.702 m)  Wt 256 lb (116.121 kg)  BMI 40.09 kg/m2  SpO2 96% , BMI Body mass index is 40.09 kg/(m^2). GEN: Well nourished, well developed, in no acute distress HEENT: normal Neck: no JVD, carotid bruits, or masses Cardiac: RRR; no murmurs, rubs, or gallops,no edema  Respiratory:  clear to auscultation bilaterally, normal work of breathing GI: soft, nontender, nondistended, + BS MS: no deformity or atrophy Skin: warm and dry, no rash Neuro:  Strength and sensation are intact Psych: euthymic mood, full affect   Recent Labs: 10/17/2015: TSH 3.085 10/21/2015: Magnesium 2.1 01/14/2016: ALT 19; B Natriuretic Peptide 256.0*; BUN 14; Creatinine, Ser 0.94; Hemoglobin 13.2; Platelets  PLATELET CLUMPS NOTED ON SMEAR, COUNT APPEARS ADEQUATE; Potassium 4.3; Sodium 139    Lipid Panel    Component Value Date/Time   CHOL 113 01/03/2015 0631   TRIG 104 01/03/2015 0631   HDL 37* 01/03/2015 0631   CHOLHDL 3.1 01/03/2015 0631   VLDL 21 01/03/2015 0631   LDLCALC 55 01/03/2015 0631      Wt Readings from Last 3 Encounters:  02/27/16 256 lb (116.121 kg)  02/20/16 246 lb (111.585 kg)  02/06/16 260 lb 6.4 oz (118.117 kg)     ASSESSMENT AND PLAN:  1.  Persistent atrial fibrillation:he is not on any rate control medication, however, his rate is very well controlled.  He is been changed  from [facility]  To Pradaxa. He denies any bleeding or rapid heart rhythm.  We will continue this patient on his current regimen.  2. Chronic diastolic heart failure:he continues on Bumex and torsemide has been discontinued.  He has been given a prescription for when necessary metolazone.  Followup labs are being completed by Dr. Luan Pulling in 3 days.  He has a chronic cough, which bothers him mostly at night.  I discussed taking them off lisinopril as this may be a result of ACE inhibitor cough.  He does not wish to make any changes until speaking with Dr. Luan Pulling.     Current medicines are reviewed at length with the patient today.    Labs/ tests ordered today include: No orders of the defined types were placed in this encounter.     Disposition:   FU with 4 months Signed, Jory Sims, NP  02/27/2016 2:17 PM    [place] [address] [place], [place] [address] Phone: [phone]; Fax: [phone]

## 2016-02-29 ENCOUNTER — Ambulatory Visit (HOSPITAL_COMMUNITY): Payer: Medicare Other | Attending: Pulmonary Disease

## 2016-02-29 DIAGNOSIS — R269 Unspecified abnormalities of gait and mobility: Secondary | ICD-10-CM | POA: Diagnosis not present

## 2016-02-29 DIAGNOSIS — R6889 Other general symptoms and signs: Secondary | ICD-10-CM | POA: Diagnosis present

## 2016-02-29 DIAGNOSIS — R29898 Other symptoms and signs involving the musculoskeletal system: Secondary | ICD-10-CM | POA: Diagnosis present

## 2016-02-29 DIAGNOSIS — R296 Repeated falls: Secondary | ICD-10-CM | POA: Insufficient documentation

## 2016-02-29 DIAGNOSIS — R262 Difficulty in walking, not elsewhere classified: Secondary | ICD-10-CM

## 2016-02-29 NOTE — Therapy (Signed)
[place], [place], [address] Phone: [phone]   Fax:  [phone]  Physical Therapy Treatment  Patient Details  Name: [name] MRN: [id number] Date of Birth: 1946-05-02 Referring Provider: Dr. [name]   Encounter Date: 02/29/2016      PT End of Session - 02/29/16 1711    Visit Number 17   Number of Visits 28   Date for PT Re-Evaluation 03/28/16   Authorization Type Medicare   Authorization Time Period 03-02-16 to 2016-04-13; Reassessment and G-codes updated on 02-Mar-2016    Authorization - Visit Number 17   Authorization - Number of Visits 28   PT Start Time 1651   PT Stop Time 1731   PT Time Calculation (min) 40 min   Equipment Utilized During Treatment Gait belt   Activity Tolerance Patient tolerated treatment well;Patient limited by pain  Rt knee pain with WB   Behavior During Therapy [facility] for tasks assessed/performed      Past Medical History  Diagnosis Date  . Varicose veins   . New onset atrial fibrillation ([place]) 01/02/2015  . Morbid obesity ([place]) 01/02/2015  . Hypertension   . Sleep apnea     been tested but has not received the CPAP yet  . GERD (gastroesophageal reflux disease)   . History of gout   . Cancer ([place]) 02/2015    colon  . Family history of colon cancer   . Family history of breast cancer in mother   . Colon cancer [facility])     March 2016    Past Surgical History  Procedure Laterality Date  . Vein ligation and stripping      left leg  . Colonoscopy N/A 03/28/2015    SLF: 1. Abdominal pain, diarrhea, rectal bleeding due to obstructing colon mass  . Esophagogastroduodenoscopy N/A 03/28/2015    SLF: 1. dysphagia 2. Mild non-erosive gastritis.   . Esophageal dilation N/A 03/28/2015    Procedure: ESOPHAGEAL DILATION;  Surgeon: [name];  [facility];  Service: Endoscopy;  Laterality: N/A;  . Partial colectomy N/A 04/03/2015    Procedure: PARTIAL  COLECTOMY;  Surgeon: [name];  Location: [facility] ORS;  Service: General;  Laterality: N/A;  . Portacath placement N/A 05/10/2015    Procedure: INSERTION PORT-A-CATH;  Surgeon: [name];  Location: [facility] ORS;  Service: General;  Laterality: N/A;  left subclavian  . Skin cancer destruction Left 08/16/15    left side of nose and left back  . Colonoscopy N/A 10/03/2015    SLF: normal anastomosis, fair prep (polyps less than 1cm could be missed), anal fissures and verrucous anal canal lesion with benign biopsy. Next TCS in 09/2016.  . Colonoscopy N/A 10/02/2015    SLF: normal anastomosis, poor bowel prep, three anal fissures    There were no vitals filed for this visit.  Visit Diagnosis:  Abnormality of gait  Leg weakness, bilateral  Difficulty walking  Falls frequently  Decreased functional activity tolerance      Subjective Assessment - 02/29/16 1651    Subjective Pt reports Rt knee pain scale 8/10   Pertinent History Patient had cancer surgery in april 2016 to removed a18inches of colon, Beasley beleives they removed all of colon, Patient began Chemo 05/17/15. patient had surgery 20 years ago to "strip veins" and has sicne had swelling in both legs. patint enjos shooting pool, patient states limited physical activity due to bad knee and limited  energy levels noting "I can t walk across the road."  "I feel worn out to where i can sit up for a while then have to sit back down and rest.  little pain with sit to stance.    Patient Stated Goals to improve overall balance, improve B hip strength, and reduce R knee pain    Currently in Pain? Yes   Pain Score 8    Pain Location Knee   Pain Orientation Right   Pain Descriptors / Indicators Aching;Tender   Pain Onset More than a month ago   Pain Frequency Intermittent   Aggravating Factors  WB activities   Pain Relieving Factors sitting   Effect of Pain on Daily Activities difficulty with long distance ambulation and standing ADLs          [facility] Adult PT Treatment/Exercise - 02/29/16 0001    Knee/Hip Exercises: Standing   Functional Squat 2 sets;10 reps;Other (comment)  front of mat   Knee/Hip Exercises: Seated   Long Arc Quad Strengthening;2 sets;15 reps   Long Arc Quad Weight 4 lbs.   Knee/Hip Flexion 2x 15 RTB   Hamstring Curl Strengthening;2 sets;15 reps;Both   Hamstring Limitations green thera-band    Abduction/Adduction  Strengthening;Both;3 sets;10 reps;Other (comment)   Sit to Sand 10 reps;without UE support           Balance Exercises - 02/29/16 1714    Balance Exercises: Standing   Tandem Stance Eyes open;3 reps;30 secs   Partial Tandem Stance Eyes open;3 reps;30 secs   Sidestepping 3 reps;Theraband  RTB 3RT infront of mat             PT Short Term Goals - 02/27/16 1729    PT SHORT TERM GOAL #1   Title Pt to be independent  in Hep in order to obtain goals in a timely manner.    Time 3   Period Weeks   Status Achieved   PT SHORT TERM GOAL #2   Title Patient will be able to ambulate 79minutes without resting in order to progress to better health habits.    Time 3   Period Weeks   Status On-going   PT SHORT TERM GOAL #3   Title Pt to be able to stand for 10 minutes to be able to complete self grooming activity without fear of falling    Time 3   Period Weeks   Status Achieved           PT Long Term Goals - 02/27/16 1730    PT LONG TERM GOAL #1   Title Pt to be Independent  in advance HEP in order to decrease frequency of falling    Time 6   Period Weeks   Status On-going   PT LONG TERM GOAL #2   Title Pt to have not fallen in the past two weeks for reduced risk of injury    Baseline Pt denied falls since his last PT progress note    Time 6   Period Weeks   Status Achieved   PT LONG TERM GOAL #3   Title Pt to be able to walk for 15 minutes without stopping for better health habits   Baseline Not formally assessed in the clinic; per pt report, he is able to ambulat ~8  minutes before resting    Time 6   Period Weeks   Status On-going   PT LONG TERM GOAL #4   Title Pt to be able to stand for  15 minutes to be safe in shower    Time 6   Period Weeks   Status On-going   PT LONG TERM GOAL #5   Title Pt will demo improved B hip strength will improve to 4/5 MMT in order to be able to walk in his home.    Baseline Goal updated    Time 6   Period Weeks   Status On-going   Additional Long Term Goals   Additional Long Term Goals Yes   PT LONG TERM GOAL #6   Title Patient will improve TUG time to <14 sec in order to reduce the risk for falls with community ambulation.    Baseline 18 sec with FWW   Time 3   Period Weeks   Status On-going   PT LONG TERM GOAL #7   Title Patient will improve Berg balance score to >45/56 in order to reduce the risk for falls with household ambulation and with ADLs.    Baseline Created 02/27/2016= 38/56    Time 6   Period Weeks   Status New               Plan - 02/29/16 1842    Clinical Impression Statement Session focus on LE strenghtening especially hip musculature and static balance activities.  Pt continues to be limited by Rt knee pain with weight bearing activities and fatigue requiring seated rest breaks through session.  Pt is improving functional strengthening with abiltiy to complete sit to stand with less difficulty though continues to fatgiue with multiple reps due to weakness.  Seated hip strengtheing exercises were complete when rest breaks required with green theraband resistnace this session.  No reports of increased pain Rt knee at end of sessoin.     Pt will benefit from skilled therapeutic intervention in order to improve on the following deficits Abnormal gait;Decreased activity tolerance;Decreased balance;Decreased endurance;Decreased strength;Difficulty walking;Pain;Postural dysfunction   Rehab Potential Good   PT Frequency 2x / week   PT Duration 6 weeks   PT Treatment/Interventions Gait  training;Functional mobility training;Therapeutic activities;Therapeutic exercise;Balance training;Patient/family education;Neuromuscular re-education;Moist Heat;Cryotherapy;Passive range of motion;Manual techniques;DME Instruction;ADLs/Self Care Home Management   PT Next Visit Plan Focus on seated hip strengthening ther ex with TB and static/dynamic standing balance activities.         Problem List Patient Active Problem List   Diagnosis Date Noted  . Fatty liver 02/06/2016  . 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 Altered mental status   . Abnormal LFTs   . Fecal incontinence   . Peripheral edema 10/15/2015  . Acute diastolic CHF (congestive heart failure) ([place]) 10/15/2015  . Generalized weakness 10/15/2015  . DVT (deep venous thrombosis) ([place]) 10/15/2015  . Atrial fibrillation ([place]) 10/15/2015  . Change in bowel habits   . Malignant neoplasm of colon ([place])   . Colon cancer ([place])   . Constipation 09/20/2015  . Elevated liver enzymes 09/20/2015  . Cellulitis 08/04/2015  . Family history of colon cancer   . Family history of breast cancer in mother   . Colon carcinoma ([place]) 04/03/2015  . Occult blood in stools   . Dysphagia, pharyngoesophageal phase   . Melena 03/15/2015  . Heme positive stool 03/15/2015  . Esophageal dysphagia 03/15/2015  . Diarrhea 03/15/2015  . New onset atrial fibrillation ([place]) 01/02/2015  . Morbid obesity ([place]) 01/02/2015  . Varicose veins of lower extremities with other complications [id number]   Ihor Austin, LPTA; CBIS [phone]  Aldona Lento 02/29/2016, 6:47 PM  [facility]  [place], [place], [address] Phone: [phone]   Fax:  [phone]  Name: [name] MRN: [id number] Date of Birth: 1946/10/09

## 2016-03-07 ENCOUNTER — Ambulatory Visit (HOSPITAL_COMMUNITY): Payer: Medicare Other

## 2016-03-07 DIAGNOSIS — R269 Unspecified abnormalities of gait and mobility: Secondary | ICD-10-CM

## 2016-03-07 DIAGNOSIS — R6889 Other general symptoms and signs: Secondary | ICD-10-CM

## 2016-03-07 DIAGNOSIS — R262 Difficulty in walking, not elsewhere classified: Secondary | ICD-10-CM

## 2016-03-07 DIAGNOSIS — R29898 Other symptoms and signs involving the musculoskeletal system: Secondary | ICD-10-CM

## 2016-03-07 DIAGNOSIS — R296 Repeated falls: Secondary | ICD-10-CM

## 2016-03-07 NOTE — Therapy (Signed)
[place], [place], [address] Phone: [phone]   Fax:  [phone]  Physical Therapy Treatment  Patient Details  Name: [name] MRN: [id number] Date of Birth: August 15, 1946 Referring Provider: Dr. [name]   Encounter Date: 03/07/2016      PT End of Session - 03/07/16 1657    Visit Number 18   Number of Visits 28   Date for PT Re-Evaluation 03/28/16   Authorization Type Medicare   Authorization Time Period 03-16-16 to 2016/04/27; Reassessment and G-codes updated on 2016-03-16 visit 20   Authorization - Visit Number 18   Authorization - Number of Visits 26   PT Start Time 1655   PT Stop Time 1735   PT Time Calculation (min) 40 min   Equipment Utilized During Treatment Gait belt   Activity Tolerance Patient tolerated treatment well;Patient limited by pain  Bil LE pain Rt>Lt knee   Behavior During Therapy [facility] for tasks assessed/performed      Past Medical History  Diagnosis Date  . Varicose veins   . New onset atrial fibrillation ([place]) 01/02/2015  . Morbid obesity ([place]) 01/02/2015  . Hypertension   . Sleep apnea     been tested but has not received the CPAP yet  . GERD (gastroesophageal reflux disease)   . History of gout   . Cancer ([place]) 02/2015    colon  . Family history of colon cancer   . Family history of breast cancer in mother   . Colon cancer [facility])     March 2016    Past Surgical History  Procedure Laterality Date  . Vein ligation and stripping      left leg  . Colonoscopy N/A 03/28/2015    SLF: 1. Abdominal pain, diarrhea, rectal bleeding due to obstructing colon mass  . Esophagogastroduodenoscopy N/A 03/28/2015    SLF: 1. dysphagia 2. Mild non-erosive gastritis.   . Esophageal dilation N/A 03/28/2015    Procedure: ESOPHAGEAL DILATION;  Surgeon: [name], MD;  Location[facility];  Service: Endoscopy;  Laterality: N/A;  . Partial colectomy N/A 04/03/2015    Procedure:  PARTIAL COLECTOMY;  Surgeon: [name], MD;  Location: [facility] ORS;  Service: General;  Laterality: N/A;  . Portacath placement N/A 05/10/2015    Procedure: INSERTION PORT-A-CATH;  Surgeon: [name], MD;  Location: [facility] ORS;  Service: General;  Laterality: N/A;  left subclavian  . Skin cancer destruction Left 08/16/15    left side of nose and left back  . Colonoscopy N/A 10/03/2015    SLF: normal anastomosis, fair prep (polyps less than 1cm could be missed), anal fissures and verrucous anal canal lesion with benign biopsy. Next TCS in 09/2016.  . Colonoscopy N/A 10/02/2015    SLF: normal anastomosis, poor bowel prep, three anal fissures    There were no vitals filed for this visit.  Visit Diagnosis:  Abnormality of gait  Leg weakness, bilateral  Difficulty walking  Falls frequently  Decreased functional activity tolerance      Subjective Assessment - 03/07/16 1707    Subjective Pt stated BLE pain scale 8/10   Pertinent History Patient had cancer surgery in april 2016 to removed a18inches of colon, mD beleives they removed all of colon, Patient began Chemo 05/17/15. patient had surgery 20 years ago to "strip veins" and has sicne had swelling in both legs. patint [place] shooting pool, patient states limited physical activity due to bad knee and limited  energy levels noting "I can t walk across the road."  "I feel worn out to where i can sit up for a while then have to sit back down and rest.  little pain with sit to stance.    Patient Stated Goals to improve overall balance, improve B hip strength, and reduce R knee pain    Currently in Pain? Yes   Pain Score 8    Pain Location Leg   Pain Orientation Right;Left   Pain Descriptors / Indicators Aching   Pain Type Chronic pain   Pain Onset More than a month ago   Pain Frequency Intermittent   Aggravating Factors  WB activities   Pain Relieving Factors sitting    Effect of Pain on Daily Activities difficulty with long distance  ambulation and standing ADLs                         [facility] Adult PT Treatment/Exercise - 03/07/16 0001    Knee/Hip Exercises: Standing   Functional Squat 2 sets;10 reps;Other (comment)   Functional Squat Limitations 1 UE   Knee/Hip Exercises: Seated   Long Arc Quad Strengthening;2 sets;15 reps   Long Arc Quad Weight 4 lbs.   Knee/Hip Flexion 2x 15 RTB   Hamstring Curl Strengthening;2 sets;15 reps;Both   Hamstring Limitations green thera-band    Abduction/Adduction  Strengthening;Both;3 sets;10 reps;Other (comment)   Abd/Adduction Limitations GTB   Sit to Sand 2 sets;10 reps;without UE support  lowest mat height             Balance Exercises - 03/07/16 1804    Balance Exercises: Standing   Tandem Stance Eyes open;3 reps;30 secs   Sidestepping 4 reps  infront of mat no HHA             PT Short Term Goals - 02/27/16 1729    PT SHORT TERM GOAL #1   Title Pt to be independent  in Hep in order to obtain goals in a timely manner.    Time 3   Period Weeks   Status Achieved   PT SHORT TERM GOAL #2   Title Patient will be able to ambulate 96minutes without resting in order to progress to better health habits.    Time 3   Period Weeks   Status On-going   PT SHORT TERM GOAL #3   Title Pt to be able to stand for 10 minutes to be able to complete self grooming activity without fear of falling    Time 3   Period Weeks   Status Achieved           PT Long Term Goals - 02/27/16 1730    PT LONG TERM GOAL #1   Title Pt to be Independent  in advance HEP in order to decrease frequency of falling    Time 6   Period Weeks   Status On-going   PT LONG TERM GOAL #2   Title Pt to have not fallen in the past two weeks for reduced risk of injury    Baseline Pt denied falls since his last PT progress note    Time 6   Period Weeks   Status Achieved   PT LONG TERM GOAL #3   Title Pt to be able to walk for 15 minutes without stopping for better health habits    Baseline Not formally assessed in the clinic; per pt report, he is able to ambulat ~8 minutes before resting  Time 6   Period Weeks   Status On-going   PT LONG TERM GOAL #4   Title Pt to be able to stand for 15 minutes to be safe in shower    Time 6   Period Weeks   Status On-going   PT LONG TERM GOAL #5   Title Pt will demo improved B hip strength will improve to 4/5 MMT in order to be able to walk in his home.    Baseline Goal updated    Time 6   Period Weeks   Status On-going   Additional Long Term Goals   Additional Long Term Goals Yes   PT LONG TERM GOAL #6   Title Patient will improve TUG time to <14 sec in order to reduce the risk for falls with community ambulation.    Baseline 18 sec with FWW   Time 3   Period Weeks   Status On-going   PT LONG TERM GOAL #7   Title Patient will improve Berg balance score to >45/56 in order to reduce the risk for falls with household ambulation and with ADLs.    Baseline Created 02/27/2016= 38/56    Time 6   Period Weeks   Status New               Plan - 03/07/16 1742    Clinical Impression Statement Pt continues to be limited by pain BLE Rt> Lt knee pain scale 8/10.  Session focus on improve LE hip strengthening in pain free primarly in seated or non-weight bearing positions as well as standing balance activities per tolerance with pain.  Pt is improving gltueal strengthening with ability to complete sit to stands faster from lower height with no HHA and able to complete 2 sets with 15 reps with all seated and sidelying exercises today.  Pt does continue to be limited by fatigue requiring seated rest breaks through session.     Pt will benefit from skilled therapeutic intervention in order to improve on the following deficits Abnormal gait;Decreased activity tolerance;Decreased balance;Decreased endurance;Decreased strength;Difficulty walking;Pain;Postural dysfunction   Rehab Potential Good   PT Frequency 2x / week   PT Duration 6  weeks   PT Treatment/Interventions Gait training;Functional mobility training;Therapeutic activities;Therapeutic exercise;Balance training;Patient/family education;Neuromuscular re-education;Moist Heat;Cryotherapy;Passive range of motion;Manual techniques;DME Instruction;ADLs/Self Care Home Management   PT Next Visit Plan Focus on seated hip strengthening ther ex with TB and static/dynamic standing balance activities.         Problem List Patient Active Problem List   Diagnosis Date Noted  . Fatty liver 02/06/2016  . 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 Altered mental status   . Abnormal LFTs   . Fecal incontinence   . Peripheral edema 10/15/2015  . Acute diastolic CHF (congestive heart failure) ([place]) 10/15/2015  . Generalized weakness 10/15/2015  . DVT (deep venous thrombosis) ([place]) 10/15/2015  . Atrial fibrillation ([place]) 10/15/2015  . Change in bowel habits   . Malignant neoplasm of colon ([place])   . Colon cancer ([place])   . Constipation 09/20/2015  . Elevated liver enzymes 09/20/2015  . Cellulitis 08/04/2015  . Family history of colon cancer   . Family history of breast cancer in mother   . Colon carcinoma ([place]) 04/03/2015  . Occult blood in stools   . Dysphagia, pharyngoesophageal phase   . Melena 03/15/2015  . Heme positive stool 03/15/2015  . Esophageal dysphagia 03/15/2015  . Diarrhea 03/15/2015  . New onset atrial fibrillation ([place]) 01/02/2015  .  Morbid obesity ([place]) 01/02/2015  . Varicose veins of lower extremities with other complications [id number]   Ihor Austin, LPTA; CBIS [phone]  Aldona Lento 03/07/2016, 6:07 PM  [place], [place], [address] Phone: [phone]   Fax:  [phone]  Name: HUBBARD CROSTON MRN: [id number] Date of Birth: [birth year]

## 2016-03-11 ENCOUNTER — Encounter (HOSPITAL_COMMUNITY): Payer: Self-pay | Admitting: Hematology & Oncology

## 2016-03-11 ENCOUNTER — Encounter (HOSPITAL_COMMUNITY): Payer: Medicare Other

## 2016-03-11 ENCOUNTER — Encounter (HOSPITAL_COMMUNITY): Payer: Medicare Other | Attending: Hematology & Oncology | Admitting: Hematology & Oncology

## 2016-03-11 VITALS — BP 99/46 | HR 74 | Temp 98.0°F | Resp 18 | Wt 269.0 lb

## 2016-03-11 DIAGNOSIS — D696 Thrombocytopenia, unspecified: Secondary | ICD-10-CM | POA: Diagnosis not present

## 2016-03-11 DIAGNOSIS — R42 Dizziness and giddiness: Secondary | ICD-10-CM | POA: Diagnosis not present

## 2016-03-11 DIAGNOSIS — C189 Malignant neoplasm of colon, unspecified: Secondary | ICD-10-CM

## 2016-03-11 DIAGNOSIS — K760 Fatty (change of) liver, not elsewhere classified: Secondary | ICD-10-CM

## 2016-03-11 DIAGNOSIS — N189 Chronic kidney disease, unspecified: Secondary | ICD-10-CM | POA: Diagnosis not present

## 2016-03-11 DIAGNOSIS — R634 Abnormal weight loss: Secondary | ICD-10-CM | POA: Diagnosis not present

## 2016-03-11 DIAGNOSIS — C187 Malignant neoplasm of sigmoid colon: Secondary | ICD-10-CM | POA: Diagnosis present

## 2016-03-11 DIAGNOSIS — C186 Malignant neoplasm of descending colon: Secondary | ICD-10-CM | POA: Diagnosis present

## 2016-03-11 DIAGNOSIS — R740 Nonspecific elevation of levels of transaminase and lactic acid dehydrogenase [LDH]: Secondary | ICD-10-CM

## 2016-03-11 DIAGNOSIS — G40909 Epilepsy, unspecified, not intractable, without status epilepticus: Secondary | ICD-10-CM | POA: Diagnosis not present

## 2016-03-11 DIAGNOSIS — F418 Other specified anxiety disorders: Secondary | ICD-10-CM | POA: Diagnosis not present

## 2016-03-11 DIAGNOSIS — R195 Other fecal abnormalities: Secondary | ICD-10-CM | POA: Diagnosis not present

## 2016-03-11 DIAGNOSIS — Z95828 Presence of other vascular implants and grafts: Secondary | ICD-10-CM

## 2016-03-11 LAB — CBC WITH DIFFERENTIAL/PLATELET
BASOS PCT: 0 %
Basophils Absolute: 0 10*3/uL (ref 0.0–0.1)
Eosinophils Absolute: 0.4 10*3/uL (ref 0.0–0.7)
Eosinophils Relative: 7 %
HCT: 32.9 % — ABNORMAL LOW (ref 39.0–52.0)
HEMOGLOBIN: 11.6 g/dL — AB (ref 13.0–17.0)
LYMPHS ABS: 1.6 10*3/uL (ref 0.7–4.0)
Lymphocytes Relative: 27 %
MCH: 33.7 pg (ref 26.0–34.0)
MCHC: 35.3 g/dL (ref 30.0–36.0)
MCV: 95.6 fL (ref 78.0–100.0)
MONOS PCT: 14 %
Monocytes Absolute: 0.8 10*3/uL (ref 0.1–1.0)
NEUTROS ABS: 3.1 10*3/uL (ref 1.7–7.7)
NEUTROS PCT: 53 %
Platelets: 105 10*3/uL — ABNORMAL LOW (ref 150–400)
RBC: 3.44 MIL/uL — AB (ref 4.22–5.81)
RDW: 13.2 % (ref 11.5–15.5)
SMEAR REVIEW: DECREASED
WBC: 5.9 10*3/uL (ref 4.0–10.5)

## 2016-03-11 LAB — COMPREHENSIVE METABOLIC PANEL
ALK PHOS: 89 U/L (ref 38–126)
ALT: 13 U/L — ABNORMAL LOW (ref 17–63)
ANION GAP: 4 — AB (ref 5–15)
AST: 25 U/L (ref 15–41)
Albumin: 3 g/dL — ABNORMAL LOW (ref 3.5–5.0)
BILIRUBIN TOTAL: 1.1 mg/dL (ref 0.3–1.2)
BUN: 38 mg/dL — ABNORMAL HIGH (ref 6–20)
CALCIUM: 8.6 mg/dL — AB (ref 8.9–10.3)
CO2: 25 mmol/L (ref 22–32)
CREATININE: 1.46 mg/dL — AB (ref 0.61–1.24)
Chloride: 109 mmol/L (ref 101–111)
GFR, EST AFRICAN AMERICAN: 55 mL/min — AB (ref >60)
GFR, EST NON AFRICAN AMERICAN: 47 mL/min — AB (ref >60)
Glucose, Bld: 102 mg/dL — ABNORMAL HIGH (ref 65–99)
Potassium: 3.9 mmol/L (ref 3.5–5.1)
SODIUM: 138 mmol/L (ref 135–145)
TOTAL PROTEIN: 6.5 g/dL (ref 6.5–8.1)

## 2016-03-11 MED ORDER — SODIUM CHLORIDE 0.9% FLUSH
10.0000 mL | INTRAVENOUS | Status: DC | PRN
  Administered 2016-03-11: 10 mL via INTRAVENOUS
  Filled 2016-03-11: qty 10

## 2016-03-11 MED ORDER — HEPARIN SOD (PORK) LOCK FLUSH 100 UNIT/ML IV SOLN
500.0000 [IU] | Freq: Once | INTRAVENOUS | Status: AC
  Administered 2016-03-11: 500 [IU] via INTRAVENOUS

## 2016-03-11 NOTE — Progress Notes (Signed)
Peter Bogus, MD [name] [address]  Stage IIA adenocarcinoma of the colon  CURRENT THERAPY: Observation  INTERVAL HISTORY: [name] 70 y.o. male returns for followup of Stage IIA adenocarcinoma of colon, S/P definitive surgery by Dr. Arnoldo Morale on 04/03/2015. He completed 7 cycles of adjuvant FOLFOX. He has had multiple medical issues this past year including a recent hospitalization in October where he was diagnosed with a seizure d/o.  [name] was here with wife today.  He has been going to physical therapy and has been making progress. He says that they "work him like a [place]" He still cannot walk without a walker and asked how long it will be until he can walk by himself. He is going to try to work on this but after physical therapy he is tired.   He said that he is eating too much. He has gained 13 pounds since his last visit. He has been eating well but his wife says that he has been eating the wrong stuff. She says that he used to never eat sweets but now he "cannot get enough of them".  He stated that his hands stay numb all the time especially in the tips of his fingers. His doctor told him that this could be because of his spine and that this is not likely to get any better because no one will operate on him because of his age; His doctor also mentioned that this can get worse over time and travel up his arm. His feet are doing fine. His wife mentioned that they might be swollen because of all the honey buns he ate this weekend. His wife drives him everywhere and he does not drive at all. He notes that he doesn't enjoy not driving.   He is having a port flush today.  His ultrasound scan on 02/15/16 that showed hepatis steatosis with no evidence of malignancy.  He and his wife are going to the beach soon for a month.    Colon carcinoma ([place])   03/28/2015 Initial Biopsy Diagnosis 1. Colon, biopsy, left descending - INVASIVE  ADENOCARCINOMA. 2. Stomach, biopsy - GASTRIC BODY AND ANTRAL-TYPE MUCOSA WITH ASSOCIATED MINIMAL CHRONIC INFLAMMATION. - NO EVIDENCE OF HELICOBACTER PYLORI, INTESTINAL METAPLASIA, DYSPLASIA OR MA   03/29/2015 Imaging CT abd/pelvis- Circumferential mucosal lesion the proximal sigmoid colon consists with colorectal carcinoma.   04/03/2015 Definitive Surgery Colon, segmental resection for tumor - INVASIVE ADENOCARCINOMA, INVADING THROUGH THE MUSCULARIS PROPRIA INTO PERICOLONIC FATTY TISSUE. - FOURTEEN LYMPH NODES, NEGATIVE FOR METASTATIC CARCINOMA (0/14). - RESECTION MARGINS, NEGATIVE FOR ATYPIA OR MALIGNA   05/16/2015 - 08/22/2015 Chemotherapy FOLFOX    05/31/2015 Treatment Plan Change Oxaliplatin dose reduced by 20% secondary to fatigue from cycle 1 of therapy.   07/24/2015 Treatment Plan Change 5 FU bolus D/C'd for cycle 5 and subsequent cycles due to thrombocytopenia.   09/18/2015 Imaging CT abd/pelvis with developmen of mild circum.wall thickening in rectosigmoid junction/rectum with mild perirectal edema, interval resection of sigmoid mass, no evidence metastatic disease. stable L adrenal nodule   10/03/2015 Procedure colonoscopy with verrucous anal canal lesion, final path no malignancy   10/05/2015 Imaging MRI brain, normal for age   10/15/2015 - 10/24/2015 Hospital Admission Diastolic CHF, seizure D/O, DVT    Malignant neoplasm of colon [facility])    Initial Diagnosis Malignant neoplasm of colon [facility])     Past Medical History  Diagnosis Date  . Varicose veins   .  New onset atrial fibrillation ([place]) 01/02/2015  . Morbid obesity ([place]) 01/02/2015  . Hypertension   . Sleep apnea     been tested but has not received the CPAP yet  . GERD (gastroesophageal reflux disease)   . History of gout   . Cancer ([place]) 02/2015    colon  . Family history of colon cancer   . Family history of breast cancer in mother   . Colon cancer [facility])     March 2016    has Varicose veins of lower extremities with other  complications; New onset atrial fibrillation ([place]); Morbid obesity ([place]); Melena; Heme positive stool; Esophageal dysphagia; Diarrhea; Occult blood in stools; Dysphagia, pharyngoesophageal phase; Colon carcinoma ([place]); Family history of colon cancer; Family history of breast cancer in mother; Cellulitis; Constipation; Elevated liver enzymes; Colon cancer ([place]); Change in bowel habits; Malignant neoplasm of colon ([place]); Peripheral edema; Acute diastolic CHF (congestive heart failure) ([place]); Generalized weakness; DVT (deep venous thrombosis) ([place]); Atrial fibrillation ([place]); Abnormal LFTs; Fecal incontinence; Altered mental status; Spondylosis, cervical, with myelopathy; and Fatty liver on his problem list.   He believes that his sister died of heart issues, she died in her sleep.   has No Known Allergies.  Current Outpatient Prescriptions on File Prior to Visit  Medication Sig Dispense Refill  . albuterol (PROVENTIL HFA;VENTOLIN HFA) 108 (90 Base) MCG/ACT inhaler Inhale 1-2 puffs into the lungs every 6 (six) hours as needed for wheezing or shortness of breath. Reported on 02/20/2016    . bumetanide (BUMEX) 2 MG tablet Take by mouth 2 (two) times daily.    . dabigatran (PRADAXA) 150 MG CAPS capsule Take 150 mg by mouth 2 (two) times daily.    [name] dicyclomine (BENTYL) 10 MG capsule 1 po 30 MINS BEFORE BREAKFAST AND MAY REPEAT BEFORE LUNCH. (Patient taking differently: Take 10 mg by mouth 2 (two) times daily. 1 po 30 MINS BEFORE BREAKFAST AND MAY REPEAT BEFORE LUNCH.) 60 capsule 11  . escitalopram (LEXAPRO) 20 MG tablet Take 1 tablet (20 mg total) by mouth daily. 30 tablet 5  . levETIRAcetam (KEPPRA) 500 MG tablet Take 1 tablet (500 mg total) by mouth 2 (two) times daily. 60 tablet 12  . lidocaine-prilocaine (EMLA) cream Apply a quarter size amount to port site 1 hour prior to chemo. Do not rub in. Cover with plastic wrap. 30 g 3  . nitroGLYCERIN (NITROGLYN) 2 % ointment Apply 0.5 inches topically as needed  for chest pain. Reported on 02/20/2016    . omeprazole (PRILOSEC) 20 MG capsule Take 1 capsule (20 mg total) by mouth daily. 30 capsule 3  . potassium chloride SA (K-DUR,KLOR-CON) 20 MEQ tablet Take 1 tablet (20 mEq total) by mouth daily. (Patient taking differently: Take 20 mEq by mouth 2 (two) times daily. ) 90 tablet 3  . psyllium (METAMUCIL) 58.6 % packet Take 1 packet by mouth daily.     No current facility-administered medications on file prior to visit.    Past Surgical History  Procedure Laterality Date  . Vein ligation and stripping      left leg  . Colonoscopy N/A 03/28/2015    SLF: 1. Abdominal pain, diarrhea, rectal bleeding due to obstructing colon mass  . Esophagogastroduodenoscopy N/A 03/28/2015    SLF: 1. dysphagia 2. Mild non-erosive gastritis.   . Esophageal dilation N/A 03/28/2015    Procedure: ESOPHAGEAL DILATION;  Surgeon: [name], MD;  Location: [facility] SUITE;  Service: Endoscopy;  Laterality: N/A;  . Partial colectomy  N/A 04/03/2015    Procedure: PARTIAL COLECTOMY;  Surgeon: [name], MD;  Location: [facility] ORS;  Service: General;  Laterality: N/A;  . Portacath placement N/A 05/10/2015    Procedure: INSERTION PORT-A-CATH;  Surgeon: [name], MD;  Location: [facility] ORS;  Service: General;  Laterality: N/A;  left subclavian  . Skin cancer destruction Left 08/16/15    left side of nose and left back  . Colonoscopy N/A 10/03/2015    SLF: normal anastomosis, fair prep (polyps less than 1cm could be missed), anal fissures and verrucous anal canal lesion with benign biopsy. Next TCS in 09/2016.  . Colonoscopy N/A 10/02/2015    SLF: normal anastomosis, poor bowel prep, three anal fissures    Denies any headaches, dizziness, double vision, fevers, chills, night sweats, nausea, vomiting, diarrhea, constipation, chest pain, heart palpitations, shortness of breath, blood in stool, black tarry stool, urinary pain, urinary burning, urinary frequency, hematuria. 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 He is smiling and very talkative he appears fatigued. In a wheelchair. SKIN:  texture, turgor are normal, no rashes, +icterus HEAD: Normocephalic, No masses, lesions, tenderness or abnormalities EYES: normal, PERRLA, EOMI, Conjunctiva are pink and non-injected. Mild left eye drainage. EARS: External ears normal OROPHARYNX:lips, buccal mucosa, and tongue normal and mucous membranes are moist  NECK: supple, thyroid normal size, non-tender, without nodularity, trachea midline LYMPH:no palpable adenopathy in the neck or supraclavicular regions. BREAST:not examined LUNGS: clear to auscultation  HEART: regular rate & rhythm ABDOMEN:abdomen soft, obese and normal bowel sounds BACK: Back symmetric, no curvature. EXTREMITIES:  less then 2 second capillary refill, no joint deformities, effusion, or inflammation, no skin discoloration  NEURO: alert & oriented x 3 with fluent speech, no focal motor/sensory deficits CN II - XII appear grossly intact   LABORATORY DATA: I have reviewed the data as listed. Results for SHA, AMER (MRN [id number])   Ref. Range 03/11/2016 11:54  Sodium Latest Ref Range: 135-145 mmol/L 138  Potassium Latest Ref Range: 3.5-5.1 mmol/L 3.9  Chloride Latest Ref Range: 101-111 mmol/L 109  CO2 Latest Ref Range: 22-32 mmol/L 25  BUN Latest Ref Range: 6-20 mg/dL 38 (H)  Creatinine Latest Ref Range: 0.61-1.24 mg/dL 1.46 (H)  Calcium Latest Ref Range: 8.9-10.3 mg/dL 8.6 (L)  EGFR (Non-African Amer.) Latest Ref Range: >60 mL/min 47 (L)  EGFR (African  American) Latest Ref Range: >60 mL/min 55 (L)  Glucose Latest Ref Range: 65-99 mg/dL 102 (H)  Anion gap Latest Ref Range: 5-15  4 (L)  Alkaline Phosphatase Latest Ref Range: 38-126 U/L 89  Albumin Latest Ref Range: 3.5-5.0 g/dL 3.0 (L)  AST Latest Ref Range: 15-41 U/L 25  ALT Latest Ref Range: 17-63 U/L 13 (L)  Total Protein Latest Ref Range: 6.5-8.1 g/dL 6.5  Total Bilirubin Latest Ref Range: 0.3-1.2 mg/dL 1.1  WBC Latest Ref Range: 4.0-10.5 K/uL 5.9  RBC Latest Ref Range: 4.22-5.81 MIL/uL 3.44 (L)  Hemoglobin Latest Ref Range: 13.0-17.0 g/dL 11.6 (L)  HCT Latest Ref Range: 39.0-52.0 % 32.9 (L)  MCV Latest Ref Range: 78.0-100.0 fL 95.6  MCH  Latest Ref Range: 26.0-34.0 pg 33.7  MCHC Latest Ref Range: 30.0-36.0 g/dL 35.3  RDW Latest Ref Range: 11.5-15.5 % 13.2  Platelets Latest Ref Range: 150-400 K/uL 105 (L)  Neutrophils Latest Units: % 53  Lymphocytes Latest Units: % 27  Monocytes Relative Latest Units: % 14  Eosinophil Latest Units: % 7  Basophil Latest Units: % 0  NEUT# Latest Ref Range: 1.7-7.7 K/uL 3.1  Lymphocyte # Latest Ref Range: 0.7-4.0 K/uL 1.6  Monocyte # Latest Ref Range: 0.1-1.0 K/uL 0.8  Eosinophils Absolute Latest Ref Range: 0.0-0.7 K/uL 0.4  Basophils Absolute Latest Ref Range: 0.0-0.1 K/uL 0.0  Smear Review Unknown PLATELETS APPEAR ...    RADIOLOGY I have personally reviewed the radiological images as listed and agreed with the findings in the report. Study Result     CLINICAL DATA: Fatty liver for 20 years. Previous colectomy. Morbid obesity, AFib. History of colon cancer.  EXAM: ULTRASOUND ABDOMEN COMPLETE  ULTRASOUND HEPATIC ELASTOGRAPHY  TECHNIQUE: Sonography of the upper abdomen was performed. In addition, ultrasound elastography evaluation of the liver was performed. A region of interest was placed within the right lobe of the liver. Following application of a compressive sonographic pulse, shear waves were detected in the adjacent hepatic  tissue and the shear wave velocity was calculated. Multiple assessments were performed at the selected site. Median shear wave velocity is correlated to a Metavir fibrosis score.  COMPARISON: CT 09/18/2015  FINDINGS: ULTRASOUND ABDOMEN  Gallbladder: Gallbladder has a normal appearance. Gallbladder wall is 3.0 mm, within normal limits. No stones or pericholecystic fluid. No sonographic Murphy's sign.  Common bile duct: Diameter: 5.0 mm  Liver: The liver is echogenic. There is attenuation of the ultrasound wave, poor visualization of the internal hepatic architecture, and loss of definition of the diaphragm. No focal liver lesions are identified.  IVC: No abnormality visualized.  Pancreas: Not well seen because of overlying bowel gas.  Spleen: Size and appearance within normal limits.  Right Kidney: Length: 10.0 cm. Echogenicity within normal limits. No mass or hydronephrosis visualized.  Left Kidney: Length: 11.2 cm. Echogenicity within normal limits. No mass or hydronephrosis visualized.  Abdominal aorta: Partially obscured by bowel gas. Visualized portion is not aneurysmal.  Other findings: None.  ULTRASOUND HEPATIC ELASTOGRAPHY  Device: Siemens Helix VTQ  Patient position: Supine  Transducer 4V1  Number of measurements: 10  Hepatic Segment: 8  Median velocity: 3.27 m/sec  IQR: 0.66  IQR/Median velocity ratio 0.20  Corresponding Metavir fibrosis score: Some F3 and F4  Risk of fibrosis: High  Limitations of exam: Large patient body habitus  Pertinent findings noted on other imaging exams: None  Please note that abnormal shear wave velocities may also be identified in clinical settings other than with hepatic fibrosis, such as: acute hepatitis, elevated right heart and central venous pressures including use of beta blockers, veno-occlusive disease (Budd-Chiari), infiltrative processes such  as mastocytosis/amyloidosis/infiltrative tumor, extrahepatic cholestasis, in the post-prandial state, and liver transplantation. Correlation with patient history, laboratory data, and clinical condition recommended.  IMPRESSION: 1. Hepatic steatosis. 2. No evidence for acute cholecystitis. 3. Portions of the abdomen are obscured bowel gas. 4. Median hepatic shear wave velocity is calculated at 3.27 m/sec. 5. Corresponding Metavir fibrosis score is some F3 and F4. 6. Risk of fibrosis is high. 7. Follow-up: Follow-up is advised.   Electronically Signed  By: Nolon Nations M.D.  On: 02/15/2016 11:59    CLINICAL DATA: Mental status changes. Nonresponsive. History of colon cancer.  EXAM: MRI HEAD WITHOUT CONTRAST  TECHNIQUE: Multiplanar, multiecho pulse sequences of the brain and surrounding structures were obtained without intravenous contrast.  COMPARISON: CT head 10/19/15. MR brain 10/05/2015.  FINDINGS: Technique was used as the patient was unable to remain still for the exam. Overall study diagnostic.  No evidence for acute infarction, hemorrhage, mass lesion, hydrocephalus, or extra-axial fluid. Generalized cerebral and cerebellar atrophy. Mild subcortical and periventricular T2 and FLAIR hyperintensities, likely chronic microvascular ischemic change. No obvious midline abnormality.  Flow voids are maintained in the carotid, basilar, and both vertebral arteries. No extracranial soft tissue abnormality is seen.  Good general agreement with prior CT and MRI.  IMPRESSION: Atrophy. Mild small vessel disease. No acute intracranial findings.   Electronically Signed  By: Staci Righter M.D.  On: 10/20/2015 09:07   ASSESSMENT AND PLAN:  Stage II colorectal cancer Adjuvant FOLFOX Family history of colon cancer Situational depression/anxiety Treatment related fatigue Elevated AST Hyperbilirubinemia, predominantly indirect,  gilbert's Dizziness/Falls Seizure D/O Diastolic CHF Weight Loss Thrombocytopenia CKD  The patient had a port flush today. Labs are reviewed and are overall stable.   His ultrasound of the liver on 02/15/16 showed hepatic steatosis, with no evidence of malignancy.  He will need to have repeat imaging done in September. Last C-scope was on 10/03/2015.   He will return in 3 months for a follow up. Currently he remains without obvious recurrence.   All questions were answered. The patient knows to call the clinic with any problems, questions or concerns. We can certainly see the patient much sooner if necessary.   This document serves as a record of services personally performed by Ancil Linsey, MD. It was created on her behalf by Kandace Blitz, a trained medical scribe. The creation of this record is based on the scribe's personal observations and the provider's statements to them. This document has been checked and approved by the attending provider.  I have reviewed the above documentation for accuracy and completeness, and I agree with the above.  This note was electronically signed.  Kelby Fam. Whitney Muse, MD

## 2016-03-11 NOTE — Progress Notes (Signed)
See office visit encounter. 

## 2016-03-11 NOTE — Patient Instructions (Addendum)
[place] at [facility] Discharge Instructions  RECOMMENDATIONS MADE BY THE CONSULTANT AND ANY TEST RESULTS WILL BE SENT TO YOUR REFERRING PHYSICIAN.   Exam and discussion by Dr Whitney Muse today Port flush today with labs, we will call you with these results  Port flushes every 8 weeks Scans again in September  Return to see the doctor in 3 months with labs  Please call the clinic if you have any questions or concerns      Thank you for choosing [place] at [facility] to provide your oncology and hematology care.  To afford each patient quality time with our provider, please arrive at least 15 minutes before your scheduled appointment time.   Beginning January 23rd 2017 lab work for the [facility] will be done in the  Main lab at [facility] on 1st floor. If you have a lab appointment with the [facility] please come in thru the  Main Entrance and check in at the main information desk  You need to re-schedule your appointment should you arrive 10 or more minutes late.  We strive to give you quality time with our providers, and arriving late affects you and other patients whose appointments are after yours.  Also, if you no show three or more times for appointments you may be dismissed from the clinic at the providers discretion.     Again, thank you for choosing [facility].  Our hope is that these requests will decrease the amount of time that you wait before being seen by our physicians.       _____________________________________________________________  Should you have questions after your visit to [facility], please contact our office at [phone] between the hours of 8:30 a.m. and 4:30 p.m.  Voicemails left after 4:30 p.m. will not be returned until the following business day.  For prescription refill requests, have your pharmacy contact our office.         Resources For Cancer Patients and  their Caregivers ? American Cancer Society: Can assist with transportation, wigs, general needs, runs Look Good Feel Better.        8738708993 ? Cancer Care: Provides financial assistance, online support groups, medication/co-pay assistance.  1-800-813-HOPE (336)232-0670) ? [place] Assists [place] cancer patients and their families through emotional , educational and financial support.  2066630957 ? [place] DSS Where to apply for food stamps, Medicaid and utility assistance. 705-332-4529 ? [facility]: Transportation to medical appointments. 6620711078 ? Social Security Administration: May apply for disability if have a Stage IV cancer. 810-280-4014 620-512-6480 ? [employer], Disability and Transit Services: Assists with nutrition, care and transit needs. [phone]

## 2016-03-11 NOTE — Progress Notes (Signed)
1100:  [name] presented for Portacath access and flush. Portacath located left chest wall accessed with  H 20 needle.  Good blood return present. Portacath flushed with 11ml NS and 500U/81ml Heparin and needle removed intact.  Procedure tolerated well and without incident.

## 2016-03-12 ENCOUNTER — Ambulatory Visit (HOSPITAL_COMMUNITY): Payer: Medicare Other | Admitting: Physical Therapy

## 2016-03-12 DIAGNOSIS — R269 Unspecified abnormalities of gait and mobility: Secondary | ICD-10-CM

## 2016-03-12 DIAGNOSIS — R29898 Other symptoms and signs involving the musculoskeletal system: Secondary | ICD-10-CM

## 2016-03-12 DIAGNOSIS — R296 Repeated falls: Secondary | ICD-10-CM

## 2016-03-12 DIAGNOSIS — R262 Difficulty in walking, not elsewhere classified: Secondary | ICD-10-CM

## 2016-03-12 DIAGNOSIS — R6889 Other general symptoms and signs: Secondary | ICD-10-CM

## 2016-03-12 LAB — CEA: CEA: 2.2 ng/mL (ref 0.0–4.7)

## 2016-03-12 NOTE — Therapy (Signed)
[place], [place], [address] Phone: [phone]   Fax:  [phone]  Physical Therapy Treatment  Patient Details  Name: [name] MRN: [id number] Date of Birth: 02-16-1946 Referring Provider: Dr. [name]   Encounter Date: 03/12/2016      PT End of Session - 03/12/16 1059    Visit Number 19   Number of Visits 28   Date for PT Re-Evaluation 03/28/16   Authorization Type Medicare   Authorization Time Period 07-Mar-2016 to 2016/04/18; Reassessment and G-codes updated on Mar 07, 2016 visit 37   Authorization - Visit Number 25   Authorization - Number of Visits 26   PT Start Time 1019   PT Stop Time 1058   PT Time Calculation (min) 39 min   Activity Tolerance Patient tolerated treatment well   Behavior During Therapy [facility] for tasks assessed/performed      Past Medical History  Diagnosis Date  . Varicose veins   . New onset atrial fibrillation ([place]) 01/02/2015  . Morbid obesity ([place]) 01/02/2015  . Hypertension   . Sleep apnea     been tested but has not received the CPAP yet  . GERD (gastroesophageal reflux disease)   . History of gout   . Cancer ([place]) 02/2015    colon  . Family history of colon cancer   . Family history of breast cancer in mother   . Colon cancer [facility])     March 2016    Past Surgical History  Procedure Laterality Date  . Vein ligation and stripping      left leg  . Colonoscopy N/A 03/28/2015    SLF: 1. Abdominal pain, diarrhea, rectal bleeding due to obstructing colon mass  . Esophagogastroduodenoscopy N/A 03/28/2015    SLF: 1. dysphagia 2. Mild non-erosive gastritis.   . Esophageal dilation N/A 03/28/2015    Procedure: ESOPHAGEAL DILATION;  Surgeon: [name], MD;  Location: [facility];  Service: Endoscopy;  Laterality: N/A;  . Partial colectomy N/A 04/03/2015    Procedure: PARTIAL COLECTOMY;  Surgeon: [name], MD;  Location: [facility] ORS;  Service: General;  Laterality:  N/A;  . Portacath placement N/A 05/10/2015    Procedure: INSERTION PORT-A-CATH;  Surgeon: [name], MD;  Location: [facility] ORS;  Service: General;  Laterality: N/A;  left subclavian  . Skin cancer destruction Left 08/16/15    left side of nose and left back  . Colonoscopy N/A 10/03/2015    SLF: normal anastomosis, fair prep (polyps less than 1cm could be missed), anal fissures and verrucous anal canal lesion with benign biopsy. Next TCS in 09/2016.  . Colonoscopy N/A 10/02/2015    SLF: normal anastomosis, poor bowel prep, three anal fissures    There were no vitals filed for this visit.  Visit Diagnosis:  Abnormality of gait  Leg weakness, bilateral  Difficulty walking  Falls frequently  Decreased functional activity tolerance      Subjective Assessment - 03/12/16 1023    Subjective Feeling weak today, just kind of rough. Has a doctor appointment later today with a neurologist.    Currently in Pain? Yes   Pain Score 8    Pain Location Knee   Pain Orientation Right   Pain Descriptors / Indicators Sharp   Pain Type Chronic pain   Pain Onset More than a month ago   Pain Frequency Intermittent   Aggravating Factors  walking    Pain Relieving Factors sitting   Effect  of Pain on Daily Activities limits ability to ambulate longer distances                         [facility] Adult PT Treatment/Exercise - 03/12/16 0001    Knee/Hip Exercises: Aerobic   Nustep L0 X17min UE+LE   Knee/Hip Exercises: Seated   Long Arc Quad Strengthening;2 sets;15 reps   Long Arc Quad Weight 4 lbs.   Clamshell with TheraBand Green  2X10   Knee/Hip Flexion 2x 10 RTB   Sit to Sand 2 sets;10 reps;without UE support  occasional Lt UE press on knee                PT Education - 03/12/16 1058    Education provided Yes   Education Details Reinforced continuation of HEP   Person(s) Educated Patient   Methods Explanation   Comprehension Verbalized understanding          PT  Short Term Goals - 02/27/16 1729    PT SHORT TERM GOAL #1   Title Pt to be independent  in Hep in order to obtain goals in a timely manner.    Time 3   Period Weeks   Status Achieved   PT SHORT TERM GOAL #2   Title Patient will be able to ambulate 56minutes without resting in order to progress to better health habits.    Time 3   Period Weeks   Status On-going   PT SHORT TERM GOAL #3   Title Pt to be able to stand for 10 minutes to be able to complete self grooming activity without fear of falling    Time 3   Period Weeks   Status Achieved           PT Long Term Goals - 02/27/16 1730    PT LONG TERM GOAL #1   Title Pt to be Independent  in advance HEP in order to decrease frequency of falling    Time 6   Period Weeks   Status On-going   PT LONG TERM GOAL #2   Title Pt to have not fallen in the past two weeks for reduced risk of injury    Baseline Pt denied falls since his last PT progress note    Time 6   Period Weeks   Status Achieved   PT LONG TERM GOAL #3   Title Pt to be able to walk for 15 minutes without stopping for better health habits   Baseline Not formally assessed in the clinic; per pt report, he is able to ambulat ~8 minutes before resting    Time 6   Period Weeks   Status On-going   PT LONG TERM GOAL #4   Title Pt to be able to stand for 15 minutes to be safe in shower    Time 6   Period Weeks   Status On-going   PT LONG TERM GOAL #5   Title Pt will demo improved B hip strength will improve to 4/5 MMT in order to be able to walk in his home.    Baseline Goal updated    Time 6   Period Weeks   Status On-going   Additional Long Term Goals   Additional Long Term Goals Yes   PT LONG TERM GOAL #6   Title Patient will improve TUG time to <14 sec in order to reduce the risk for falls with community ambulation.    Baseline 18 sec with FWW  Time 3   Period Weeks   Status On-going   PT LONG TERM GOAL #7   Title Patient will improve Berg balance score  to >45/56 in order to reduce the risk for falls with household ambulation and with ADLs.    Baseline Created 02/27/2016= 38/56    Time 6   Period Weeks   Status New               Plan - 03/12/16 1100    Clinical Impression Statement Patient reports that he is feeling more tired today for some reason. Noted to favor the rt LE with transfers with weight shift to the Lt. Standing activity limited by reports of knee pain.Attempting to balance standing and sitting activity as tolerated. Continue to progress strengthening as the patient tolerates. Reports that he is to see a neurologist later today.    PT Next Visit Plan Continue to progress strength as tolerated, balance standing and sitting exercises at knee pain allows.     PT Home Exercise Plan continue   Consulted and Agree with Plan of Care Patient        Problem List Patient Active Problem List   Diagnosis Date Noted  . Fatty liver 02/06/2016  . 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 Altered mental status   . Abnormal LFTs   . Fecal incontinence   . Peripheral edema 10/15/2015  . Acute diastolic CHF (congestive heart failure) ([place]) 10/15/2015  . Generalized weakness 10/15/2015  . DVT (deep venous thrombosis) ([place]) 10/15/2015  . Atrial fibrillation ([place]) 10/15/2015  . Change in bowel habits   . Malignant neoplasm of colon ([place])   . Colon cancer ([place])   . Constipation 09/20/2015  . Elevated liver enzymes 09/20/2015  . Cellulitis 08/04/2015  . Family history of colon cancer   . Family history of breast cancer in mother   . Colon carcinoma ([place]) 04/03/2015  . Occult blood in stools   . Dysphagia, pharyngoesophageal phase   . Melena 03/15/2015  . Heme positive stool 03/15/2015  . Esophageal dysphagia 03/15/2015  . Diarrhea 03/15/2015  . New onset atrial fibrillation ([place]) 01/02/2015  . Morbid obesity ([place]) 01/02/2015  . Varicose veins of lower extremities with other complications [id number]     Cassell Clement, PT, CSCS Pager [phone]  03/12/2016, 11:05 AM  [place], [place], [address] Phone: [phone]   Fax:  [phone]  Name: [name] MRN: [id number] Date of Birth: 02-02-1946

## 2016-03-13 ENCOUNTER — Other Ambulatory Visit (HOSPITAL_COMMUNITY): Payer: Self-pay | Admitting: Oncology

## 2016-03-13 ENCOUNTER — Other Ambulatory Visit (HOSPITAL_COMMUNITY): Payer: Self-pay | Admitting: *Deleted

## 2016-03-13 DIAGNOSIS — C189 Malignant neoplasm of colon, unspecified: Secondary | ICD-10-CM

## 2016-03-14 ENCOUNTER — Ambulatory Visit (HOSPITAL_COMMUNITY): Payer: Medicare Other

## 2016-03-14 DIAGNOSIS — R269 Unspecified abnormalities of gait and mobility: Secondary | ICD-10-CM

## 2016-03-14 DIAGNOSIS — R6889 Other general symptoms and signs: Secondary | ICD-10-CM

## 2016-03-14 DIAGNOSIS — R262 Difficulty in walking, not elsewhere classified: Secondary | ICD-10-CM

## 2016-03-14 DIAGNOSIS — R296 Repeated falls: Secondary | ICD-10-CM

## 2016-03-14 DIAGNOSIS — R29898 Other symptoms and signs involving the musculoskeletal system: Secondary | ICD-10-CM

## 2016-03-14 NOTE — Therapy (Signed)
[place], [place], [address] Phone: [phone]   Fax:  [phone]  Physical Therapy Treatment  Patient Details  Name: [name] MRN: [id number] Date of Birth: 10-16-1946 Referring Provider: Dr. [name]   Encounter Date: 03/14/2016      PT End of Session - 03/14/16 0821    Visit Number 20   Number of Visits 28   Date for PT Re-Evaluation 03/28/16   Authorization Type Medicare   Authorization Time Period 03-17-16 to 2016-04-28; Reassessment and G-codes updated on 2016-03-17 visit 7   Authorization - Visit Number 78   Authorization - Number of Visits 26   PT Start Time 0801   PT Stop Time 0849   PT Time Calculation (min) 48 min   Equipment Utilized During Treatment Gait belt   Activity Tolerance Patient tolerated treatment well   Behavior During Therapy [facility] for tasks assessed/performed      Past Medical History  Diagnosis Date  . Varicose veins   . New onset atrial fibrillation ([place]) 01/02/2015  . Morbid obesity ([place]) 01/02/2015  . Hypertension   . Sleep apnea     been tested but has not received the CPAP yet  . GERD (gastroesophageal reflux disease)   . History of gout   . Cancer ([place]) 02/2015    colon  . Family history of colon cancer   . Family history of breast cancer in mother   . Colon cancer [facility])     March 2016    Past Surgical History  Procedure Laterality Date  . Vein ligation and stripping      left leg  . Colonoscopy N/A 03/28/2015    SLF: 1. Abdominal pain, diarrhea, rectal bleeding due to obstructing colon mass  . Esophagogastroduodenoscopy N/A 03/28/2015    SLF: 1. dysphagia 2. Mild non-erosive gastritis.   . Esophageal dilation N/A 03/28/2015    Procedure: ESOPHAGEAL DILATION;  Surgeon: [name], MD;  Location: [facility];  Service: Endoscopy;  Laterality: N/A;  . Partial colectomy N/A 04/03/2015    Procedure: PARTIAL COLECTOMY;  Surgeon: [name], MD;   Location: [facility] ORS;  Service: General;  Laterality: N/A;  . Portacath placement N/A 05/10/2015    Procedure: INSERTION PORT-A-CATH;  Surgeon: [name], MD;  Location: [facility] ORS;  Service: General;  Laterality: N/A;  left subclavian  . Skin cancer destruction Left 08/16/15    left side of nose and left back  . Colonoscopy N/A 10/03/2015    SLF: normal anastomosis, fair prep (polyps less than 1cm could be missed), anal fissures and verrucous anal canal lesion with benign biopsy. Next TCS in 09/2016.  . Colonoscopy N/A 10/02/2015    SLF: normal anastomosis, poor bowel prep, three anal fissures    There were no vitals filed for this visit.  Visit Diagnosis:  Abnormality of gait  Leg weakness, bilateral  Difficulty walking  Falls frequently  Decreased functional activity tolerance      Subjective Assessment - 03/14/16 0814    Subjective Pt's wife stated MD apt on Tuesday diagnosed pt with Parkinson's disorder.  Reports difficulty with grip strength with UE, unable to hold pen to write with.  Pt stated he has increased pain Rt knee, blaming it partially on the cold weather today.   Pertinent History Patient had cancer surgery in april 2016 to removed a18inches of colon, mD beleives they removed all of colon, Patient began Chemo 05/17/15. patient had surgery  20 years ago to "strip veins" and has sicne had swelling in both legs. patint [place] shooting pool, patient states limited physical activity due to bad knee and limited energy levels noting "I can t walk across the road."  "I feel worn out to where i can sit up for a while then have to sit back down and rest.  little pain with sit to stance.    Patient Stated Goals to improve overall balance, improve B hip strength, and reduce R knee pain    Currently in Pain? Yes   Pain Score 8    Pain Location Knee   Pain Orientation Right   Pain Descriptors / Indicators Aching   Pain Type Chronic pain   Pain Onset More than a month ago   Pain  Frequency Intermittent   Pain Relieving Factors sitting   Effect of Pain on Daily Activities limits ability to ambulate longer distances              [facility] Adult PT Treatment/Exercise - 03/14/16 0001    Knee/Hip Exercises: Aerobic   Nustep L1 x 8 min UE/LE   Knee/Hip Exercises: Seated   Long Arc Quad Strengthening;2 sets;15 reps   Long Arc Quad Weight 4 lbs.   Clamshell with TheraBand Green   Knee/Hip Flexion 2x 15 GTB   Hamstring Curl Strengthening;2 sets;15 reps;Both   Hamstring Limitations green thera-band    Abduction/Adduction  Strengthening;Both;3 sets;10 reps;Other (comment)   Abd/Adduction Limitations GTB   Sit to Sand 2 sets;10 reps;without UE support   Knee/Hip Exercises: Prone   Hip Extension Limitations   Hip Extension Limitations partial plank position with UE on high mat and LE on floor hip extension and abduction 15x each             Balance Exercises - 03/14/16 0836    Balance Exercises: Standing   Tandem Stance Eyes open;2 reps;30 secs   Sidestepping 5 reps;Theraband  infront of mat, no HHA             PT Short Term Goals - 02/27/16 1729    PT SHORT TERM GOAL #1   Title Pt to be independent  in Hep in order to obtain goals in a timely manner.    Time 3   Period Weeks   Status Achieved   PT SHORT TERM GOAL #2   Title Patient will be able to ambulate 27minutes without resting in order to progress to better health habits.    Time 3   Period Weeks   Status On-going   PT SHORT TERM GOAL #3   Title Pt to be able to stand for 10 minutes to be able to complete self grooming activity without fear of falling    Time 3   Period Weeks   Status Achieved           PT Long Term Goals - 02/27/16 1730    PT LONG TERM GOAL #1   Title Pt to be Independent  in advance HEP in order to decrease frequency of falling    Time 6   Period Weeks   Status On-going   PT LONG TERM GOAL #2   Title Pt to have not fallen in the past two weeks for reduced  risk of injury    Baseline Pt denied falls since his last PT progress note    Time 6   Period Weeks   Status Achieved   PT LONG TERM GOAL #3   Title Pt  to be able to walk for 15 minutes without stopping for better health habits   Baseline Not formally assessed in the clinic; per pt report, he is able to ambulat ~8 minutes before resting    Time 6   Period Weeks   Status On-going   PT LONG TERM GOAL #4   Title Pt to be able to stand for 15 minutes to be safe in shower    Time 6   Period Weeks   Status On-going   PT LONG TERM GOAL #5   Title Pt will demo improved B hip strength will improve to 4/5 MMT in order to be able to walk in his home.    Baseline Goal updated    Time 6   Period Weeks   Status On-going   Additional Long Term Goals   Additional Long Term Goals Yes   PT LONG TERM GOAL #6   Title Patient will improve TUG time to <14 sec in order to reduce the risk for falls with community ambulation.    Baseline 18 sec with FWW   Time 3   Period Weeks   Status On-going   PT LONG TERM GOAL #7   Title Patient will improve Berg balance score to >45/56 in order to reduce the risk for falls with household ambulation and with ADLs.    Baseline Created 02/27/2016= 38/56    Time 6   Period Weeks   Status New               Plan - 03/14/16 [id number]    Clinical Impression Statement Discussion held with pt. and Parkinson specialist DPT about new diagnosis, referal sent to MD for treatment as well as OT referral sent for grip strength and fine motor skills for Bil UE.  Session focus on LE strengthening and balance activiteis, pt limited by knee pain with standing activities. Added partial quadruped (UE on high mat with LE on floor due to pain with WB) exercises to improve gluteal and core strengthening.  End of session pt limited by fatigue, pain scale Rt knee remains at 8/10   Pt will benefit from skilled therapeutic intervention in order to improve on the following deficits Abnormal  gait;Decreased activity tolerance;Decreased balance;Decreased endurance;Decreased strength;Difficulty walking;Pain;Postural dysfunction   Rehab Potential Good   PT Frequency 2x / week   PT Duration 6 weeks   PT Treatment/Interventions Gait training;Functional mobility training;Therapeutic activities;Therapeutic exercise;Balance training;Patient/family education;Neuromuscular re-education;Moist Heat;Cryotherapy;Passive range of motion;Manual techniques;DME Instruction;ADLs/Self Care Home Management   PT Next Visit Plan Continue to progress strength as tolerated, balance standing and sitting exercises at knee pain allows.          Problem List Patient Active Problem List   Diagnosis Date Noted  . Fatty liver 02/06/2016  . 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 Altered mental status   . Abnormal LFTs   . Fecal incontinence   . Peripheral edema 10/15/2015  . Acute diastolic CHF (congestive heart failure) ([place]) 10/15/2015  . Generalized weakness 10/15/2015  . DVT (deep venous thrombosis) ([place]) 10/15/2015  . Atrial fibrillation ([place]) 10/15/2015  . Change in bowel habits   . Malignant neoplasm of colon ([place])   . Colon cancer ([place])   . Constipation 09/20/2015  . Elevated liver enzymes 09/20/2015  . Cellulitis 08/04/2015  . Family history of colon cancer   . Family history of breast cancer in mother   . Colon carcinoma ([place]) 04/03/2015  . Occult blood in stools   .  Dysphagia, pharyngoesophageal phase   . Melena 03/15/2015  . Heme positive stool 03/15/2015  . Esophageal dysphagia 03/15/2015  . Diarrhea 03/15/2015  . New onset atrial fibrillation ([place]) 01/02/2015  . Morbid obesity ([place]) 01/02/2015  . Varicose veins of lower extremities with other complications [id number]   Ihor Austin, LPTA; CBIS [phone]  Aldona Lento 03/14/2016, 9:09 AM  [place], [place], [address] Phone:  [phone]   Fax:  [phone]  Name: [name] MRN: [id number] Date of Birth: 08/30/1946

## 2016-03-15 ENCOUNTER — Encounter (HOSPITAL_COMMUNITY): Payer: Self-pay | Admitting: *Deleted

## 2016-03-15 ENCOUNTER — Inpatient Hospital Stay (HOSPITAL_COMMUNITY)
Admission: EM | Admit: 2016-03-15 | Discharge: 2016-03-22 | DRG: 292 | Disposition: A | Discharge status: Home / Self Care | Payer: Medicare Other | Attending: Pulmonary Disease | Admitting: Pulmonary Disease

## 2016-03-15 ENCOUNTER — Other Ambulatory Visit (HOSPITAL_COMMUNITY): Payer: Self-pay | Admitting: Hematology & Oncology

## 2016-03-15 ENCOUNTER — Other Ambulatory Visit: Payer: Self-pay

## 2016-03-15 ENCOUNTER — Telehealth: Payer: Self-pay | Admitting: Cardiology

## 2016-03-15 ENCOUNTER — Emergency Department (HOSPITAL_COMMUNITY): Payer: Medicare Other

## 2016-03-15 DIAGNOSIS — Z85828 Personal history of other malignant neoplasm of skin: Secondary | ICD-10-CM

## 2016-03-15 DIAGNOSIS — Z833 Family history of diabetes mellitus: Secondary | ICD-10-CM

## 2016-03-15 DIAGNOSIS — E876 Hypokalemia: Secondary | ICD-10-CM | POA: Diagnosis present

## 2016-03-15 DIAGNOSIS — R609 Edema, unspecified: Secondary | ICD-10-CM | POA: Diagnosis present

## 2016-03-15 DIAGNOSIS — D696 Thrombocytopenia, unspecified: Secondary | ICD-10-CM | POA: Diagnosis present

## 2016-03-15 DIAGNOSIS — Z803 Family history of malignant neoplasm of breast: Secondary | ICD-10-CM

## 2016-03-15 DIAGNOSIS — Z8249 Family history of ischemic heart disease and other diseases of the circulatory system: Secondary | ICD-10-CM

## 2016-03-15 DIAGNOSIS — R1319 Other dysphagia: Secondary | ICD-10-CM | POA: Diagnosis present

## 2016-03-15 DIAGNOSIS — I959 Hypotension, unspecified: Secondary | ICD-10-CM | POA: Diagnosis present

## 2016-03-15 DIAGNOSIS — E8809 Other disorders of plasma-protein metabolism, not elsewhere classified: Secondary | ICD-10-CM | POA: Diagnosis present

## 2016-03-15 DIAGNOSIS — Z85048 Personal history of other malignant neoplasm of rectum, rectosigmoid junction, and anus: Secondary | ICD-10-CM

## 2016-03-15 DIAGNOSIS — D649 Anemia, unspecified: Secondary | ICD-10-CM | POA: Diagnosis present

## 2016-03-15 DIAGNOSIS — I4891 Unspecified atrial fibrillation: Secondary | ICD-10-CM | POA: Diagnosis present

## 2016-03-15 DIAGNOSIS — I110 Hypertensive heart disease with heart failure: Principal | ICD-10-CM | POA: Diagnosis present

## 2016-03-15 DIAGNOSIS — C189 Malignant neoplasm of colon, unspecified: Secondary | ICD-10-CM | POA: Diagnosis present

## 2016-03-15 DIAGNOSIS — K219 Gastro-esophageal reflux disease without esophagitis: Secondary | ICD-10-CM | POA: Diagnosis present

## 2016-03-15 DIAGNOSIS — I482 Chronic atrial fibrillation, unspecified: Secondary | ICD-10-CM

## 2016-03-15 DIAGNOSIS — G40909 Epilepsy, unspecified, not intractable, without status epilepticus: Secondary | ICD-10-CM | POA: Diagnosis present

## 2016-03-15 DIAGNOSIS — Z801 Family history of malignant neoplasm of trachea, bronchus and lung: Secondary | ICD-10-CM

## 2016-03-15 DIAGNOSIS — Z9049 Acquired absence of other specified parts of digestive tract: Secondary | ICD-10-CM

## 2016-03-15 DIAGNOSIS — M4712 Other spondylosis with myelopathy, cervical region: Secondary | ICD-10-CM | POA: Diagnosis present

## 2016-03-15 DIAGNOSIS — I5033 Acute on chronic diastolic (congestive) heart failure: Secondary | ICD-10-CM | POA: Diagnosis present

## 2016-03-15 DIAGNOSIS — K7581 Nonalcoholic steatohepatitis (NASH): Secondary | ICD-10-CM | POA: Diagnosis present

## 2016-03-15 DIAGNOSIS — Z800 Family history of malignant neoplasm of digestive organs: Secondary | ICD-10-CM

## 2016-03-15 DIAGNOSIS — J9811 Atelectasis: Secondary | ICD-10-CM | POA: Diagnosis present

## 2016-03-15 DIAGNOSIS — N179 Acute kidney failure, unspecified: Secondary | ICD-10-CM | POA: Diagnosis present

## 2016-03-15 DIAGNOSIS — E8770 Fluid overload, unspecified: Secondary | ICD-10-CM | POA: Diagnosis present

## 2016-03-15 DIAGNOSIS — R601 Generalized edema: Secondary | ICD-10-CM | POA: Diagnosis present

## 2016-03-15 DIAGNOSIS — Z6841 Body Mass Index (BMI) 40.0 and over, adult: Secondary | ICD-10-CM

## 2016-03-15 DIAGNOSIS — Z7901 Long term (current) use of anticoagulants: Secondary | ICD-10-CM

## 2016-03-15 DIAGNOSIS — K7460 Unspecified cirrhosis of liver: Secondary | ICD-10-CM | POA: Diagnosis present

## 2016-03-15 NOTE — Telephone Encounter (Signed)
Wt at last visit 256 lbs 02/27/16 wt now 275 lbs  Given metolazone for 3 days by Dr Luan Pulling tues-thurs for wt of 269 lbs,was unable to reach Dr Luan Pulling today.   Wife says husband is depressed over Parkinsons diagnosis but is not eating more,is not short of breath but has had to cut off socks off because feet are swollen. Wife is going to take him to ED for evaluation

## 2016-03-15 NOTE — Telephone Encounter (Signed)
Pt has had increased weight gain since Tuesday

## 2016-03-15 NOTE — ED Notes (Signed)
Pt reports leg swelling since Tuesday, reports weight gain of 31 pounds in the past week. Denies shortness or breath.

## 2016-03-16 ENCOUNTER — Inpatient Hospital Stay (HOSPITAL_COMMUNITY): Payer: Medicare Other

## 2016-03-16 ENCOUNTER — Encounter (HOSPITAL_COMMUNITY): Payer: Self-pay | Admitting: Family Medicine

## 2016-03-16 DIAGNOSIS — K7460 Unspecified cirrhosis of liver: Secondary | ICD-10-CM | POA: Diagnosis not present

## 2016-03-16 DIAGNOSIS — Z85048 Personal history of other malignant neoplasm of rectum, rectosigmoid junction, and anus: Secondary | ICD-10-CM | POA: Diagnosis not present

## 2016-03-16 DIAGNOSIS — Z9049 Acquired absence of other specified parts of digestive tract: Secondary | ICD-10-CM | POA: Diagnosis not present

## 2016-03-16 DIAGNOSIS — Z6841 Body Mass Index (BMI) 40.0 and over, adult: Secondary | ICD-10-CM | POA: Diagnosis not present

## 2016-03-16 DIAGNOSIS — E876 Hypokalemia: Secondary | ICD-10-CM | POA: Diagnosis present

## 2016-03-16 DIAGNOSIS — D696 Thrombocytopenia, unspecified: Secondary | ICD-10-CM | POA: Diagnosis present

## 2016-03-16 DIAGNOSIS — Z8249 Family history of ischemic heart disease and other diseases of the circulatory system: Secondary | ICD-10-CM | POA: Diagnosis not present

## 2016-03-16 DIAGNOSIS — Z800 Family history of malignant neoplasm of digestive organs: Secondary | ICD-10-CM | POA: Diagnosis not present

## 2016-03-16 DIAGNOSIS — Z7901 Long term (current) use of anticoagulants: Secondary | ICD-10-CM | POA: Diagnosis not present

## 2016-03-16 DIAGNOSIS — Z801 Family history of malignant neoplasm of trachea, bronchus and lung: Secondary | ICD-10-CM | POA: Diagnosis not present

## 2016-03-16 DIAGNOSIS — I509 Heart failure, unspecified: Secondary | ICD-10-CM | POA: Diagnosis not present

## 2016-03-16 DIAGNOSIS — E8779 Other fluid overload: Secondary | ICD-10-CM | POA: Diagnosis not present

## 2016-03-16 DIAGNOSIS — M4712 Other spondylosis with myelopathy, cervical region: Secondary | ICD-10-CM | POA: Diagnosis present

## 2016-03-16 DIAGNOSIS — E8809 Other disorders of plasma-protein metabolism, not elsewhere classified: Secondary | ICD-10-CM | POA: Diagnosis present

## 2016-03-16 DIAGNOSIS — K7581 Nonalcoholic steatohepatitis (NASH): Secondary | ICD-10-CM | POA: Diagnosis present

## 2016-03-16 DIAGNOSIS — D649 Anemia, unspecified: Secondary | ICD-10-CM | POA: Diagnosis present

## 2016-03-16 DIAGNOSIS — I5033 Acute on chronic diastolic (congestive) heart failure: Secondary | ICD-10-CM | POA: Diagnosis present

## 2016-03-16 DIAGNOSIS — G40909 Epilepsy, unspecified, not intractable, without status epilepticus: Secondary | ICD-10-CM | POA: Diagnosis present

## 2016-03-16 DIAGNOSIS — E8770 Fluid overload, unspecified: Secondary | ICD-10-CM | POA: Diagnosis present

## 2016-03-16 DIAGNOSIS — Z833 Family history of diabetes mellitus: Secondary | ICD-10-CM | POA: Diagnosis not present

## 2016-03-16 DIAGNOSIS — N179 Acute kidney failure, unspecified: Secondary | ICD-10-CM | POA: Diagnosis present

## 2016-03-16 DIAGNOSIS — I959 Hypotension, unspecified: Secondary | ICD-10-CM | POA: Diagnosis present

## 2016-03-16 DIAGNOSIS — R1319 Other dysphagia: Secondary | ICD-10-CM | POA: Diagnosis present

## 2016-03-16 DIAGNOSIS — Z85828 Personal history of other malignant neoplasm of skin: Secondary | ICD-10-CM | POA: Diagnosis not present

## 2016-03-16 DIAGNOSIS — Z803 Family history of malignant neoplasm of breast: Secondary | ICD-10-CM | POA: Diagnosis not present

## 2016-03-16 DIAGNOSIS — I110 Hypertensive heart disease with heart failure: Secondary | ICD-10-CM | POA: Diagnosis present

## 2016-03-16 DIAGNOSIS — K219 Gastro-esophageal reflux disease without esophagitis: Secondary | ICD-10-CM | POA: Diagnosis present

## 2016-03-16 DIAGNOSIS — J9811 Atelectasis: Secondary | ICD-10-CM | POA: Diagnosis present

## 2016-03-16 DIAGNOSIS — I482 Chronic atrial fibrillation: Secondary | ICD-10-CM | POA: Diagnosis present

## 2016-03-16 LAB — COMPREHENSIVE METABOLIC PANEL
ALBUMIN: 2.9 g/dL — AB (ref 3.5–5.0)
ALK PHOS: 80 U/L (ref 38–126)
ALT: 12 U/L — ABNORMAL LOW (ref 17–63)
ANION GAP: 5 (ref 5–15)
AST: 25 U/L (ref 15–41)
BILIRUBIN TOTAL: 1 mg/dL (ref 0.3–1.2)
BUN: 46 mg/dL — AB (ref 6–20)
CO2: 26 mmol/L (ref 22–32)
Calcium: 8.7 mg/dL — ABNORMAL LOW (ref 8.9–10.3)
Chloride: 107 mmol/L (ref 101–111)
Creatinine, Ser: 1.52 mg/dL — ABNORMAL HIGH (ref 0.61–1.24)
GFR calc Af Amer: 52 mL/min — ABNORMAL LOW (ref >60)
GFR calc non Af Amer: 45 mL/min — ABNORMAL LOW (ref >60)
GLUCOSE: 99 mg/dL (ref 65–99)
POTASSIUM: 3.9 mmol/L (ref 3.5–5.1)
SODIUM: 138 mmol/L (ref 135–145)
TOTAL PROTEIN: 6.4 g/dL — AB (ref 6.5–8.1)

## 2016-03-16 LAB — CBC WITH DIFFERENTIAL/PLATELET
BASOS ABS: 0 10*3/uL (ref 0.0–0.1)
BASOS PCT: 0 %
Eosinophils Absolute: 0.4 10*3/uL (ref 0.0–0.7)
Eosinophils Relative: 7 %
HEMATOCRIT: 31.2 % — AB (ref 39.0–52.0)
Hemoglobin: 11.2 g/dL — ABNORMAL LOW (ref 13.0–17.0)
LYMPHS PCT: 32 %
Lymphs Abs: 1.8 10*3/uL (ref 0.7–4.0)
MCH: 33.9 pg (ref 26.0–34.0)
MCHC: 35.9 g/dL (ref 30.0–36.0)
MCV: 94.5 fL (ref 78.0–100.0)
Monocytes Absolute: 0.6 10*3/uL (ref 0.1–1.0)
Monocytes Relative: 10 %
NEUTROS ABS: 2.8 10*3/uL (ref 1.7–7.7)
NEUTROS PCT: 50 %
PLATELETS: 111 10*3/uL — AB (ref 150–400)
RBC: 3.3 MIL/uL — AB (ref 4.22–5.81)
RDW: 13.1 % (ref 11.5–15.5)
WBC: 5.7 10*3/uL (ref 4.0–10.5)

## 2016-03-16 LAB — ECHOCARDIOGRAM COMPLETE
Height: 67 in
Weight: 4377.45 oz

## 2016-03-16 LAB — BRAIN NATRIURETIC PEPTIDE: B Natriuretic Peptide: 274 pg/mL — ABNORMAL HIGH (ref 0.0–100.0)

## 2016-03-16 LAB — TROPONIN I
Troponin I: <0.03 ng/mL (ref <0.031)
Troponin I: <0.03 ng/mL (ref <0.031)

## 2016-03-16 MED ORDER — FUROSEMIDE 10 MG/ML IJ SOLN
40.0000 mg | Freq: Two times a day (BID) | INTRAMUSCULAR | Status: DC
  Administered 2016-03-16 (×2): 40 mg via INTRAVENOUS
  Filled 2016-03-16 (×2): qty 4

## 2016-03-16 MED ORDER — DABIGATRAN ETEXILATE MESYLATE 150 MG PO CAPS
150.0000 mg | ORAL_CAPSULE | Freq: Two times a day (BID) | ORAL | Status: DC
  Administered 2016-03-16 – 2016-03-22 (×13): 150 mg via ORAL
  Filled 2016-03-16 (×20): qty 1

## 2016-03-16 MED ORDER — ONDANSETRON HCL 4 MG/2ML IJ SOLN: 4.0000 mg | Freq: Four times a day (QID) | INTRAMUSCULAR | Status: DC | PRN

## 2016-03-16 MED ORDER — ACETAMINOPHEN 325 MG PO TABS: 650.0000 mg | ORAL_TABLET | ORAL | Status: DC | PRN

## 2016-03-16 MED ORDER — DICYCLOMINE HCL 10 MG PO CAPS
10.0000 mg | ORAL_CAPSULE | Freq: Two times a day (BID) | ORAL | Status: DC
  Administered 2016-03-16 – 2016-03-22 (×13): 10 mg via ORAL
  Filled 2016-03-16 (×13): qty 1

## 2016-03-16 MED ORDER — NITROGLYCERIN 2 % TD OINT: 0.5000 [in_us] | TOPICAL_OINTMENT | TRANSDERMAL | Status: DC | PRN

## 2016-03-16 MED ORDER — SODIUM CHLORIDE 0.9% FLUSH
3.0000 mL | Freq: Two times a day (BID) | INTRAVENOUS | Status: DC
  Administered 2016-03-16 – 2016-03-21 (×10): 3 mL via INTRAVENOUS

## 2016-03-16 MED ORDER — SODIUM CHLORIDE 0.9 % IV SOLN: 250.0000 mL | INTRAVENOUS | Status: DC | PRN

## 2016-03-16 MED ORDER — SODIUM CHLORIDE 0.9% FLUSH: 3.0000 mL | INTRAVENOUS | Status: DC | PRN

## 2016-03-16 MED ORDER — LEVETIRACETAM 500 MG PO TABS
500.0000 mg | ORAL_TABLET | Freq: Two times a day (BID) | ORAL | Status: DC
  Administered 2016-03-16 – 2016-03-22 (×13): 500 mg via ORAL
  Filled 2016-03-16 (×13): qty 1

## 2016-03-16 MED ORDER — LOSARTAN POTASSIUM 50 MG PO TABS
25.0000 mg | ORAL_TABLET | Freq: Every day | ORAL | Status: DC
  Administered 2016-03-16: 25 mg via ORAL
  Filled 2016-03-16: qty 1

## 2016-03-16 MED ORDER — PANTOPRAZOLE SODIUM 40 MG PO TBEC
40.0000 mg | DELAYED_RELEASE_TABLET | Freq: Every day | ORAL | Status: DC
  Administered 2016-03-16 – 2016-03-17 (×2): 40 mg via ORAL
  Filled 2016-03-16 (×2): qty 1

## 2016-03-16 MED ORDER — ALBUTEROL SULFATE (2.5 MG/3ML) 0.083% IN NEBU: 3.0000 mL | INHALATION_SOLUTION | Freq: Four times a day (QID) | RESPIRATORY_TRACT | Status: DC | PRN

## 2016-03-16 MED ORDER — ESCITALOPRAM OXALATE 10 MG PO TABS
20.0000 mg | ORAL_TABLET | Freq: Every day | ORAL | Status: DC
  Administered 2016-03-16 – 2016-03-22 (×7): 20 mg via ORAL
  Filled 2016-03-16 (×7): qty 2

## 2016-03-16 MED ORDER — ONDANSETRON HCL 4 MG/2ML IJ SOLN: 4.0000 mg | Freq: Three times a day (TID) | INTRAMUSCULAR | Status: AC | PRN

## 2016-03-16 NOTE — Progress Notes (Signed)
*  PRELIMINARY RESULTS* Echocardiogram 2D Echocardiogram has been performed.  Beryle Beams 03/16/2016, 12:13 PM

## 2016-03-16 NOTE — ED Provider Notes (Signed)
CSN: [id number]     Arrival date & time 03/15/16  1757 History   First MD Initiated Contact with Patient 03/15/16 2242     Chief Complaint  Patient presents with  . Leg Swelling     (Consider location/radiation/quality/duration/timing/severity/associated sxs/prior Treatment) The history is provided by the patient and the spouse.   [name] is a 70 y.o. male with past medical history outlined below, most significant tonight of history of diastolic heart failure, atrial fibrillation and prior dvt in his right lower extremity presenting with an approximate 30 pound weight gain in the past 1-[redacted] weeks along with worsened bilateral lower extremity swelling.  He takes bumex 2 mg twice daily and just completed a 3 day course of metolazone yesterday which he has used once before with successful reduction in fluid retention, but this time has instead gained rather than lost fluid weight.  Wife at bedside states Dr. Luan Pulling likes his weight to be less than 250, ideally around 244, it was 275 on our scale tonight.  He denies shortness of breath, coughing, chest pain or other complaint at this time.  His legs feel tight and heavy, but denies pain.   Denies orthopnea, but also reports sleeps on 2 pillows at baseline.  He is fairly sedentary at baseline so cannot comment on exercise induced sob.       Past Medical History  Diagnosis Date  . Varicose veins   . New onset atrial fibrillation ([place]) 01/02/2015  . Morbid obesity ([place]) 01/02/2015  . Hypertension   . Sleep apnea     been tested but has not received the CPAP yet  . GERD (gastroesophageal reflux disease)   . History of gout   . Cancer ([place]) 02/2015    colon  . Family history of colon cancer   . Family history of breast cancer in mother   . Colon cancer [facility])     March 2016   Past Surgical History  Procedure Laterality Date  . Vein ligation and stripping      left leg  . Colonoscopy N/A 03/28/2015    SLF: 1. Abdominal pain, diarrhea,  rectal bleeding due to obstructing colon mass  . Esophagogastroduodenoscopy N/A 03/28/2015    SLF: 1. dysphagia 2. Mild non-erosive gastritis.   . Esophageal dilation N/A 03/28/2015    Procedure: ESOPHAGEAL DILATION;  Surgeon: [name], MD;  Location[facility] END[facility];  Service: Endoscopy;  Laterality: N/A;  . Partial colectomy N/A 04/03/2015    Procedure: PARTIAL COLECTOMY;  Surgeon: [name], MD;  Location: [facility] ORS;  Service: General;  Laterality: N/A;  . Portacath placement N/A 05/10/2015    Procedure: INSERTION PORT-A-CATH;  Surgeon: [name], MD;  Location: [facility] ORS;  Service: General;  Laterality: N/A;  left subclavian  . Skin cancer destruction Left 08/16/15    left side of nose and left back  . Colonoscopy N/A 10/03/2015    SLF: normal anastomosis, fair prep (polyps less than 1cm could be missed), anal fissures and verrucous anal canal lesion with benign biopsy. Next TCS in 09/2016.  . Colonoscopy N/A 10/02/2015    SLF: normal anastomosis, poor bowel prep, three anal fissures   Family History  Problem Relation Age of Onset  . Breast cancer Mother     dx under 59  . Colon cancer Mother     dx in her mid 1s  . Heart disease Father   . Hyperlipidemia Father   . Liver cancer Father  heavy ETOH user when young  . Cancer Sister     slow "blood" cancer  . Diabetes Sister   . Heart disease Sister   . Diabetes Brother   . Cancer Maternal Aunt     2-3 maternal aunts with Cancer NOS  . Lung cancer Paternal Uncle     three uncles with lung cancer - all smokers  . Cancer Paternal Grandmother     possible bone cancer   Social History  Substance Use Topics  . Smoking status: Never Smoker   . Smokeless tobacco: Never Used  . Alcohol Use: No    Review of Systems  Constitutional: Negative for fever.  HENT: Negative for congestion and sore throat.   Eyes: Negative.   Respiratory: Negative for chest tightness, shortness of breath and wheezing.   Cardiovascular:  Positive for leg swelling. Negative for chest pain.  Gastrointestinal: Negative for nausea and abdominal pain.  Genitourinary: Negative.   Musculoskeletal: Negative for joint swelling, arthralgias and neck pain.  Skin: Negative.  Negative for rash and wound.  Neurological: Negative for dizziness, weakness, light-headedness, numbness and headaches.  Psychiatric/Behavioral: Negative.       Allergies  Review of patient's allergies indicates no known allergies.  Home Medications   Prior to Admission medications   Medication Sig Start Date End Date Taking? Authorizing Provider  albuterol (PROVENTIL HFA;VENTOLIN HFA) 108 (90 Base) MCG/ACT inhaler Inhale 1-2 puffs into the lungs every 6 (six) hours as needed for wheezing or shortness of breath. Reported on 02/20/2016    Historical Provider, MD  bumetanide (BUMEX) 2 MG tablet Take by mouth 2 (two) times daily.    Historical Provider, MD  dabigatran (PRADAXA) 150 MG CAPS capsule Take 150 mg by mouth 2 (two) times daily.    Historical Provider, MD  dicyclomine (BENTYL) 10 MG capsule 1 po 30 MINS BEFORE BREAKFAST AND MAY REPEAT BEFORE LUNCH. Patient taking differently: Take 10 mg by mouth 2 (two) times daily. 1 po 30 MINS BEFORE BREAKFAST AND MAY REPEAT BEFORE LUNCH. 10/30/15   [name], MD  escitalopram (LEXAPRO) 20 MG tablet Take 1 tablet (20 mg total) by mouth daily. 01/02/16   Baird Cancer, PA-C  levETIRAcetam (KEPPRA) 500 MG tablet Take 1 tablet (500 mg total) by mouth 2 (two) times daily. 10/24/15   [name], MD  lidocaine-prilocaine (EMLA) cream Apply a quarter size amount to port site 1 hour prior to chemo. Do not rub in. Cover with plastic wrap. 04/28/15   [name], MD  losartan (COZAAR) 100 MG tablet Take 100 mg by mouth daily.    Historical Provider, MD  nitroGLYCERIN (NITROGLYN) 2 % ointment Apply 0.5 inches topically as needed for chest pain. Reported on 02/20/2016    Historical Provider, MD  omeprazole (PRILOSEC) 20  MG capsule Take 1 capsule (20 mg total) by mouth daily. 03/15/15   [name], PA-C  potassium chloride SA (K-DUR,KLOR-CON) 20 MEQ tablet Take 1 tablet (20 mEq total) by mouth daily. Patient taking differently: Take 20 mEq by mouth 2 (two) times daily.  10/13/15   [name], MD  psyllium (METAMUCIL) 58.6 % packet Take 1 packet by mouth daily.    Historical Provider, MD   BP 85/37 mmHg  Pulse 60  Temp(Src) 98.2 F (36.8 C) (Temporal)  Resp 12  Ht 5\' 7"  (1.702 m)  Wt 124.739 kg  BMI 43.06 kg/m2  SpO2 100% Physical Exam  Constitutional: He appears well-developed and well-nourished.  HENT:  Head: Normocephalic and atraumatic.  Eyes: Conjunctivae are normal.  Neck: Normal range of motion.  Cardiovascular: Normal rate, regular rhythm, normal heart sounds and intact distal pulses.   Pulmonary/Chest: Effort normal and breath sounds normal. No respiratory distress. He has no wheezes.  Faint rales left base.  Abdominal: Soft. Bowel sounds are normal. There is no tenderness.  Musculoskeletal: Normal range of motion. He exhibits edema.  Pitting edema bilateral legs proximally to lower thighs.    Neurological: He is alert.  Skin: Skin is warm and dry.  Psychiatric: He has a normal mood and affect.  Nursing note and vitals reviewed.   ED Course  Procedures (including critical care time) Labs Review  Results for orders placed or performed during the hospital encounter of 03/15/16  CBC with Differential  Result Value Ref Range   WBC 5.7 4.0 - 10.5 K/uL   RBC 3.30 (L) 4.22 - 5.81 MIL/uL   Hemoglobin 11.2 (L) 13.0 - 17.0 g/dL   HCT 31.2 (L) 39.0 - 52.0 %   MCV 94.5 78.0 - 100.0 fL   MCH 33.9 26.0 - 34.0 pg   MCHC 35.9 30.0 - 36.0 g/dL   RDW 13.1 11.5 - 15.5 %   Platelets 111 (L) 150 - 400 K/uL   Neutrophils Relative % 50 %   Neutro Abs 2.8 1.7 - 7.7 K/uL   Lymphocytes Relative 32 %   Lymphs Abs 1.8 0.7 - 4.0 K/uL   Monocytes Relative 10 %   Monocytes Absolute 0.6 0.1 -  1.0 K/uL   Eosinophils Relative 7 %   Eosinophils Absolute 0.4 0.0 - 0.7 K/uL   Basophils Relative 0 %   Basophils Absolute 0.0 0.0 - 0.1 K/uL  Brain natriuretic peptide  Result Value Ref Range   B Natriuretic Peptide 274.0 (H) 0.0 - 100.0 pg/mL  Comprehensive metabolic panel  Result Value Ref Range   Sodium 138 135 - 145 mmol/L   Potassium 3.9 3.5 - 5.1 mmol/L   Chloride 107 101 - 111 mmol/L   CO2 26 22 - 32 mmol/L   Glucose, Bld 99 65 - 99 mg/dL   BUN 46 (H) 6 - 20 mg/dL   Creatinine, Ser 1.52 (H) 0.61 - 1.24 mg/dL   Calcium 8.7 (L) 8.9 - 10.3 mg/dL   Total Protein 6.4 (L) 6.5 - 8.1 g/dL   Albumin 2.9 (L) 3.5 - 5.0 g/dL   AST 25 15 - 41 U/L   ALT 12 (L) 17 - 63 U/L   Alkaline Phosphatase 80 38 - 126 U/L   Total Bilirubin 1.0 0.3 - 1.2 mg/dL   GFR calc non Af Amer 45 (L) >60 mL/min   GFR calc Af Amer 52 (L) >60 mL/min   Anion gap 5 5 - 15       Imaging Review Dg Chest 2 View  03/16/2016  CLINICAL DATA:  Acute onset of bilateral lower extremity edema. Initial encounter. EXAM: CHEST  2 VIEW COMPARISON:  Chest radiograph performed 12/14/2015 FINDINGS: The lungs are mildly hypoexpanded. Mild right basilar atelectasis is noted. No pleural effusion or pneumothorax is seen. The cardiomediastinal silhouette is borderline normal in size. No acute osseous abnormalities are identified. A left-sided chest port is noted ending about the proximal SVC. IMPRESSION: Lungs mildly hypoexpanded, with mild right basilar atelectasis. Electronically Signed   By: Garald Balding M.D.   On: 03/16/2016 00:05   I have personally reviewed and evaluated these images and lab results as part of my medical decision-making.  EKG Interpretation   Date/Time:  Friday March 15 2016 23:59:02 EDT Ventricular Rate:  80 PR Interval:    QRS Duration: 123 QT Interval:  450 QTC Calculation: 519 R Axis:   57 Text Interpretation:  Atrial fibrillation Nonspecific intraventricular  conduction delay Minimal ST  elevation, inferior leads No acute changes  Confirmed by Kathrynn Humble, MD, ANKIT [phone]) on 03/16/2016 12:04:30 AM      MDM   Final diagnoses:  Acute on chronic diastolic congestive heart failure (HCC)    Pt with diastolic chf with no response to intense outpatient tx including 3 day course of metolazone.  Labs reviewed, vital signs reviewed including bp which has been low and is consistently low per prior ed visits and pt and wife also endorses chronic low bp.  He denies weakness, no confusion or weakness.  No fevers.  bp is a chronic finding.  Pt will need admission for tx of his escalating chf. Discussed with Dr. Kathrynn Humble who agrees with plan.  Call placed to [facility] for admission. Temp admission orders placed.    Evalee Jefferson, PA-C 03/16/16 ID:[id number]  Varney Biles, MD 03/16/16 [phone]

## 2016-03-16 NOTE — H&P (Signed)
[facility] History and Physical  [name] [id number] DOB: Oct 27, 1946 DOA: 03/15/2016  Referring physician: ED physician PCP: [name], MD  Specialists: Dr. Whitney Muse   Chief Complaint: Edema  HPI: [name] is a 70 y.o. male with PMH of colorectal cancer status post excision and 7 cycles of neoadjuvant FOLFOX, seizure disorder, nonalcoholic liver disease, chronic diastolic CHF, and chronic atrial fibrillation on Pradaxa who presents to the ED with marked peripheral edema which is worsening despite a course of outpatient diuretics. He has reportedly gained 20 pounds in the last 2 weeks as his lower extremities have become increasingly taut with edema. Patient has had similar symptoms previously that was managed effectively with diuretic therapy. He was evaluated by his primary care physician in given a 3 day course of metolazone, but continued to gain another 6 pounds despite this. Patient has denied any chest pain, palpitations, or dyspnea throughout this illness. He denies any recent fevers, chills, sick contacts, or long distance travel. Of note, the patient is also known to have nonalcoholic liver disease with US/elastography performed 1 month ago and indicative of high risk for fibrosis. This is been associated with hypoalbuminemia.  In ED, patient was found to be afebrile, saturating well on room air, with blood pressure of 85/37, and vitals otherwise stable. Chest x-ray demonstrates mild hypoexpansion and mild right basilar atelectasis. EKG is notable for atrial fibrillation. Blood work is obtained from the ED and returns notable for a serum creatinine of 1.52, up from an apparent baseline of 0.9, a fairly stable chronic anemia, and stable chronic thrombocytopenia. BNP is elevated to a value of 274. There were no interventions undertaken in the emergency department and the hospitalists were asked to admit the patient for volume overload.   Where does patient  live?   At home     Can patient participate in ADLs?  Yes       Review of Systems:   General: no fevers, chills, sweats, poor appetite, or fatigue. Wt gain as per HPI.  HEENT: no blurry vision, hearing changes or sore throat Pulm: no dyspnea, cough, or wheeze CV: no chest pain or palpitations Abd: no nausea, vomiting, abdominal pain, diarrhea, or constipation GU: no dysuria, hematuria, increased urinary frequency, or urgency  Ext:  Progressive edema b/l LEs Neuro: no focal weakness, numbness, or tingling, no vision change or hearing loss Skin: no rash, no wounds MSK: No muscle spasm, no deformity, no red, hot, or swollen joint Heme: No easy bruising or bleeding Travel history: No recent long distant travel    Allergy: No Known Allergies  Past Medical History  Diagnosis Date  . Varicose veins   . New onset atrial fibrillation ([place]) 01/02/2015  . Morbid obesity ([place]) 01/02/2015  . Hypertension   . Sleep apnea     been tested but has not received the CPAP yet  . GERD (gastroesophageal reflux disease)   . History of gout   . Cancer ([place]) 02/2015    colon  . Family history of colon cancer   . Family history of breast cancer in mother   . Colon cancer [facility])     March 2016    Past Surgical History  Procedure Laterality Date  . Vein ligation and stripping      left leg  . Colonoscopy N/A 03/28/2015    SLF: 1. Abdominal pain, diarrhea, rectal bleeding due to obstructing colon mass  . Esophagogastroduodenoscopy N/A 03/28/2015    SLF: 1. dysphagia 2. Mild  non-erosive gastritis.   . Esophageal dilation N/A 03/28/2015    Procedure: ESOPHAGEAL DILATION;  Surgeon: [name], MD;  Location[facility] END[facility];  Service: Endoscopy;  Laterality: N/A;  . Partial colectomy N/A 04/03/2015    Procedure: PARTIAL COLECTOMY;  Surgeon: [name], MD;  Location: [facility] ORS;  Service: General;  Laterality: N/A;  . Portacath placement N/A 05/10/2015    Procedure: INSERTION PORT-A-CATH;  Surgeon: [name], MD;  Location: [facility] ORS;  Service: General;  Laterality: N/A;  left subclavian  . Skin cancer destruction Left 08/16/15    left side of nose and left back  . Colonoscopy N/A 10/03/2015    SLF: normal anastomosis, fair prep (polyps less than 1cm could be missed), anal fissures and verrucous anal canal lesion with benign biopsy. Next TCS in 09/2016.  . Colonoscopy N/A 10/02/2015    SLF: normal anastomosis, poor bowel prep, three anal fissures    Social History:  reports that he has never smoked. He has never used smokeless tobacco. He reports that he does not drink alcohol or use illicit drugs.  Family History:  Family History  Problem Relation Age of Onset  . Breast cancer Mother     dx under 28  . Colon cancer Mother     dx in her mid 92s  . Heart disease Father   . Hyperlipidemia Father   . Liver cancer Father     heavy ETOH user when young  . Cancer Sister     slow "blood" cancer  . Diabetes Sister   . Heart disease Sister   . Diabetes Brother   . Cancer Maternal Aunt     2-3 maternal aunts with Cancer NOS  . Lung cancer Paternal Uncle     three uncles with lung cancer - all smokers  . Cancer Paternal Grandmother     possible bone cancer     Prior to Admission medications   Medication Sig Start Date End Date Taking? Authorizing Provider  albuterol (PROVENTIL HFA;VENTOLIN HFA) 108 (90 Base) MCG/ACT inhaler Inhale 1-2 puffs into the lungs every 6 (six) hours as needed for wheezing or shortness of breath. Reported on 02/20/2016    Historical Provider, MD  bumetanide (BUMEX) 2 MG tablet Take by mouth 2 (two) times daily.    Historical Provider, MD  dabigatran (PRADAXA) 150 MG CAPS capsule Take 150 mg by mouth 2 (two) times daily.    Historical Provider, MD  dicyclomine (BENTYL) 10 MG capsule 1 po 30 MINS BEFORE BREAKFAST AND MAY REPEAT BEFORE LUNCH. Patient taking differently: Take 10 mg by mouth 2 (two) times daily. 1 po 30 MINS BEFORE BREAKFAST AND MAY REPEAT BEFORE  LUNCH. 10/30/15   [name], MD  escitalopram (LEXAPRO) 20 MG tablet Take 1 tablet (20 mg total) by mouth daily. 01/02/16   Baird Cancer, PA-C  levETIRAcetam (KEPPRA) 500 MG tablet Take 1 tablet (500 mg total) by mouth 2 (two) times daily. 10/24/15   [name], MD  lidocaine-prilocaine (EMLA) cream Apply a quarter size amount to port site 1 hour prior to chemo. Do not rub in. Cover with plastic wrap. 04/28/15   [name], MD  losartan (COZAAR) 100 MG tablet Take 100 mg by mouth daily.    Historical Provider, MD  nitroGLYCERIN (NITROGLYN) 2 % ointment Apply 0.5 inches topically as needed for chest pain. Reported on 02/20/2016    Historical Provider, MD  omeprazole (PRILOSEC) 20 MG capsule Take 1 capsule (20  mg total) by mouth daily. 03/15/15   [name], PA-C  potassium chloride SA (K-DUR,KLOR-CON) 20 MEQ tablet Take 1 tablet (20 mEq total) by mouth daily. Patient taking differently: Take 20 mEq by mouth 2 (two) times daily.  10/13/15   [name], MD  psyllium (METAMUCIL) 58.6 % packet Take 1 packet by mouth daily.    Historical Provider, MD    Physical Exam: Filed Vitals:   03/15/16 1813 03/16/16 0000 03/16/16 0258  BP: 89/46 85/37 105/67  Pulse: 58 60 50  Temp: 98.2 F (36.8 C)  98 F (36.7 C)  TempSrc: Temporal  Axillary  Resp: 16 12 22   Height: 5\' 7"  (1.702 m)  5\' 7"  (1.702 m)  Weight: 124.739 kg (275 lb)  124.1 kg (273 lb 9.5 oz)  SpO2: 100% 100% 99%   General: Not in acute distress HEENT:       Eyes: PERRL, EOMI, no scleral icterus or conjunctival pallor.       ENT: No discharge from the ears or nose, no pharyngeal ulcers.        Neck: Nno bruit, no appreciable mass. Neck veins with modest distention.  Heme: No cervical adenopathy, no pallor Cardiac: S1/S2, RRR, No murmurs, No gallops or rubs. Pulm: Good air movement bilaterally. No rales, wheezing, rhonchi or rubs. Abd: Soft, nondistended, nontender, BS present. Ext: Pitting edema to mid-thigh  bilaterally Musculoskeletal: No gross deformity, no red, hot, swollen joints   Skin: No rashes or wounds on exposed surfaces  Neuro: Alert, oriented X3, cranial nerves II-XII grossly intact. No focal findings Psych: Patient is not overtly psychotic, appropriate mood and affect.  Labs on Admission:  Basic Metabolic Panel:  Recent Labs Lab 03/11/16 1154 03/15/16 2341  NA 138 138  K 3.9 3.9  CL 109 107  CO2 25 26  GLUCOSE 102* 99  BUN 38* 46*  CREATININE 1.46* 1.52*  CALCIUM 8.6* 8.7*   Liver Function Tests:  Recent Labs Lab 03/11/16 1154 03/15/16 2341  AST 25 25  ALT 13* 12*  ALKPHOS 89 80  BILITOT 1.1 1.0  PROT 6.5 6.4*  ALBUMIN 3.0* 2.9*   No results for input(s): LIPASE, AMYLASE in the last 168 hours. No results for input(s): AMMONIA in the last 168 hours. CBC:  Recent Labs Lab 03/11/16 1154 03/16/16 2341  WBC 5.9 5.7  NEUTROABS 3.1 2.8  HGB 11.6* 11.2*  HCT 32.9* 31.2*  MCV 95.6 94.5  PLT 105* 111*   Cardiac Enzymes: No results for input(s): CKTOTAL, CKMB, CKMBINDEX, TROPONINI in the last 168 hours.  BNP (last 3 results)  Recent Labs  12/27/15 1039 01/14/16 1200 03/15/16 2341  BNP 254.0* 256.0* 274.0*    ProBNP (last 3 results) No results for input(s): PROBNP in the last 8760 hours.  CBG: No results for input(s): GLUCAP in the last 168 hours.  Radiological Exams on Admission: Dg Chest 2 View  03/16/2016  CLINICAL DATA:  Acute onset of bilateral lower extremity edema. Initial encounter. EXAM: CHEST  2 VIEW COMPARISON:  Chest radiograph performed 12/14/2015 FINDINGS: The lungs are mildly hypoexpanded. Mild right basilar atelectasis is noted. No pleural effusion or pneumothorax is seen. The cardiomediastinal silhouette is borderline normal in size. No acute osseous abnormalities are identified. A left-sided chest port is noted ending about the proximal SVC. IMPRESSION: Lungs mildly hypoexpanded, with mild right basilar atelectasis. Electronically  Signed   By: Garald Balding M.D.   On: 03/16/2016 00:05    EKG: Independently reviewed.  Abnormal  findings:   Atrial fibrillation   Assessment/Plan  1. Volume overload  - Suspect this is multifactorial with contributions from HF and liver dz with hypoalbuminemia  - Hesitant to diurese aggressively given soft BP and absence of any respiratory sxs  - Will likely require albumin or midodrine with diuresis for BP support  - Daily wts, SLIV, strict I/Os, fluid-restrict diet   2. Acute diastolic CHF  - TTE in Sept '16 with EF 60-65%, mild MR  - Presents with massive peripheral edema but no pulmonary edema  - Right heart failure a consideration  - Plan for diuresis as above   3. 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 Atrial fibrillation, chronic  - CHADS-VASc 3 for age, HTN, HF  - Continue Pradaxa  - Rate is well-controlled  - Monitor on telemetry    5. AKI  - SCr 1.52, up from apparent b/l of 0.9  - Could be from outp diuresis attempts, or d/t vol o/l and poor CO/perfusion resulting from that  - Diuresis could potentially improve perfusion by returning his hemodynamics to the Frank-Starling curve, therefore improving renal fxn    6. Anemia, thrombocytopenia  - Stable, chronic, likely d/t liver dz  - Monitor for bleed, no evidence now    DVT ppx: Continue Pradaxa  Code Status: Full code Family Communication:  Yes, patient's wife at bed side Disposition Plan: Admit to inpatient   Date of Service 03/16/2016    Vianne Bulls, MD [facility] Pager [phone]  If 7PM-7AM, please contact night-coverage [email or web address] Password [facility] 03/16/2016, 3:57 AM  `

## 2016-03-16 NOTE — Progress Notes (Signed)
  [name] [id number] DOB: 11-09-1946 DOA: 03/15/2016 PCP: Peter Bogus, MD   Subjective: This man was admitted early hours this morning with significant bilateral leg swelling and peripheral pitting edema. He denies any dyspnea, chest pain. He has been relatively hypotensive since admission. He is on ARB for his hypertension.           Physical Exam: Blood pressure 87/40, pulse 73, temperature 98.2 F (36.8 Beasley), temperature source Oral, resp. rate 20, height 5\' 7"  (1.702 m), weight 124.1 kg (273 lb 9.5 oz), SpO2 100 %. He looks systemically well. Blood pressure soft but he is alert and oriented. Heart sounds are present without gallop rhythm. Jugular venous pressure is not easily seen. Lung fields are clear. He does have peripheral pitting edema up to his knees bilaterally. He is alert and oriented.   Investigations:  No results found for this or any previous visit (from the past 240 hour(s)).   Basic Metabolic Panel:  Recent Labs  03/15/16 2341  NA 138  K 3.9  CL 107  CO2 26  GLUCOSE 99  BUN 46*  CREATININE 1.52*  CALCIUM 8.7*   Liver Function Tests:  Recent Labs  03/15/16 2341  AST 25  ALT 12*  ALKPHOS 80  BILITOT 1.0  PROT 6.4*  ALBUMIN 2.9*     CBC:  Recent Labs  03/16/16 2341  WBC 5.7  NEUTROABS 2.8  HGB 11.2*  HCT 31.2*  MCV 94.5  PLT 111*    Dg Chest 2 View  03/16/2016  CLINICAL DATA:  Acute onset of bilateral lower extremity edema. Initial encounter. EXAM: CHEST  2 VIEW COMPARISON:  Chest radiograph performed 12/14/2015 FINDINGS: The lungs are mildly hypoexpanded. Mild right basilar atelectasis is noted. No pleural effusion or pneumothorax is seen. The cardiomediastinal silhouette is borderline normal in size. No acute osseous abnormalities are identified. A left-sided chest port is noted ending about the proximal SVC. IMPRESSION: Lungs mildly hypoexpanded, with mild right basilar atelectasis. Electronically Signed   By:  [name] M.D.   On: 03/16/2016 00:05      Medications: I have reviewed the patient's current medications.  Impression: 1. Bilateral leg edema. I think the etiology is multifactorial with a combination of hypoalbuminemia and possibly diastolic congestive heart failure although he does not seem to have any fluid overload in his lungs. 2. [name]. 3. Hypotension, on losartan.     Plan: 1. Hold losartan. 2. Start intravenous furosemide twice a day area and monitor renal function closely.  Consultants:  None.   Procedures:  None.   Antibiotics:  None.                   Code Status: Full code.  Family Communication: I discussed the plan with the patient at the bedside.   Disposition Plan: Home when medically stable.  Time spent: 20 minutes.   LOS: 0 days   [name]   03/16/2016, 9:49 AM

## 2016-03-17 ENCOUNTER — Encounter (HOSPITAL_COMMUNITY): Payer: Self-pay | Admitting: *Deleted

## 2016-03-17 DIAGNOSIS — E8809 Other disorders of plasma-protein metabolism, not elsewhere classified: Secondary | ICD-10-CM

## 2016-03-17 DIAGNOSIS — K7581 Nonalcoholic steatohepatitis (NASH): Secondary | ICD-10-CM

## 2016-03-17 LAB — CBC
HEMATOCRIT: 29.6 % — AB (ref 39.0–52.0)
Hemoglobin: 10.4 g/dL — ABNORMAL LOW (ref 13.0–17.0)
MCH: 33.4 pg (ref 26.0–34.0)
MCHC: 35.1 g/dL (ref 30.0–36.0)
MCV: 95.2 fL (ref 78.0–100.0)
Platelets: 108 10*3/uL — ABNORMAL LOW (ref 150–400)
RBC: 3.11 MIL/uL — AB (ref 4.22–5.81)
RDW: 13.2 % (ref 11.5–15.5)
WBC: 5 10*3/uL (ref 4.0–10.5)

## 2016-03-17 LAB — COMPREHENSIVE METABOLIC PANEL
ALT: 13 U/L — AB (ref 17–63)
AST: 24 U/L (ref 15–41)
Albumin: 2.9 g/dL — ABNORMAL LOW (ref 3.5–5.0)
Alkaline Phosphatase: 81 U/L (ref 38–126)
Anion gap: 6 (ref 5–15)
BILIRUBIN TOTAL: 1.3 mg/dL — AB (ref 0.3–1.2)
BUN: 43 mg/dL — AB (ref 6–20)
CALCIUM: 8.7 mg/dL — AB (ref 8.9–10.3)
CO2: 28 mmol/L (ref 22–32)
CREATININE: 1.57 mg/dL — AB (ref 0.61–1.24)
Chloride: 106 mmol/L (ref 101–111)
GFR calc Af Amer: 50 mL/min — ABNORMAL LOW (ref >60)
GFR, EST NON AFRICAN AMERICAN: 43 mL/min — AB (ref >60)
Glucose, Bld: 95 mg/dL (ref 65–99)
Potassium: 3.8 mmol/L (ref 3.5–5.1)
Sodium: 140 mmol/L (ref 135–145)
TOTAL PROTEIN: 6.1 g/dL — AB (ref 6.5–8.1)

## 2016-03-17 LAB — PROTIME-INR
INR: 1.93 — ABNORMAL HIGH (ref 0.00–1.49)
PROTHROMBIN TIME: 22 s — AB (ref 11.6–15.2)

## 2016-03-17 MED ORDER — FUROSEMIDE 10 MG/ML IJ SOLN
60.0000 mg | Freq: Two times a day (BID) | INTRAMUSCULAR | Status: DC
  Administered 2016-03-17 – 2016-03-20 (×6): 60 mg via INTRAVENOUS
  Filled 2016-03-17 (×6): qty 6

## 2016-03-17 MED ORDER — PANTOPRAZOLE SODIUM 40 MG PO TBEC
40.0000 mg | DELAYED_RELEASE_TABLET | Freq: Every day | ORAL | Status: DC
  Administered 2016-03-18 – 2016-03-22 (×5): 40 mg via ORAL
  Filled 2016-03-17 (×5): qty 1

## 2016-03-17 MED ORDER — FUROSEMIDE 10 MG/ML IJ SOLN
60.0000 mg | Freq: Two times a day (BID) | INTRAMUSCULAR | Status: DC
  Administered 2016-03-17: 60 mg via INTRAVENOUS
  Filled 2016-03-17: qty 6

## 2016-03-17 MED ORDER — ALBUMIN HUMAN 25 % IV SOLN
25.0000 g | Freq: Two times a day (BID) | INTRAVENOUS | Status: DC
  Administered 2016-03-17 – 2016-03-19 (×5): 25 g via INTRAVENOUS
  Administered 2016-03-20: 12.5 g via INTRAVENOUS
  Filled 2016-03-17 (×8): qty 100

## 2016-03-17 NOTE — Progress Notes (Signed)
  [name] [id number] DOB: January 20, 1946 DOA: 03/15/2016 PCP: [name], MD   Subjective: This man was admitted with significant bilateral leg swelling and peripheral pitting edema. He denies any dyspnea, chest pain. . I discontinued his ARB yesterday and his blood pressure is somewhat better today. He says that his swelling is not improved.           Physical Exam: Blood pressure 100/56, pulse 60, temperature 98.2 F (36.8 C), temperature source Oral, resp. rate 20, height 5\' 7"  (1.702 m), weight 124.1 kg (273 lb 9.5 oz), SpO2 100 %. He looks systemically well. Blood pressure soft but he is alert and oriented. Heart sounds are present without gallop rhythm. Jugular venous pressure is not easily seen. Lung fields are clear. He does have peripheral pitting edema up to his knees bilaterally. He is alert and oriented.   Investigations:  No results found for this or any previous visit (from the past 240 hour(s)).   Basic Metabolic Panel:  Recent Labs  03/15/16 2341 03/17/16 0602  NA 138 140  K 3.9 3.8  CL 107 106  CO2 26 28  GLUCOSE 99 95  BUN 46* 43*  CREATININE 1.52* 1.57*  CALCIUM 8.7* 8.7*   Liver Function Tests:  Recent Labs  03/15/16 2341 03/17/16 0602  AST 25 24  ALT 12* 13*  ALKPHOS 80 81  BILITOT 1.0 1.3*  PROT 6.4* 6.1*  ALBUMIN 2.9* 2.9*     CBC:  Recent Labs  03/16/16 2341 03/17/16 0602  WBC 5.7 5.0  NEUTROABS 2.8  --   HGB 11.2* 10.4*  HCT 31.2* 29.6*  MCV 94.5 95.2  PLT 111* 108*    Dg Chest 2 View  03/16/2016  CLINICAL DATA:  Acute onset of bilateral lower extremity edema. Initial encounter. EXAM: CHEST  2 VIEW COMPARISON:  Chest radiograph performed 12/14/2015 FINDINGS: The lungs are mildly hypoexpanded. Mild right basilar atelectasis is noted. No pleural effusion or pneumothorax is seen. The cardiomediastinal silhouette is borderline normal in size. No acute osseous abnormalities are identified. A left-sided chest  port is noted ending about the proximal SVC. IMPRESSION: Lungs mildly hypoexpanded, with mild right basilar atelectasis. Electronically Signed   By: Garald Balding M.D.   On: 03/16/2016 00:05      Medications: I have reviewed the patient's current medications.  Impression: 1. Bilateral leg edema. I think the etiology is multifactorial with a combination of hypoalbuminemia and possibly diastolic congestive heart failure although he does not seem to have any fluid overload in his lungs. 2. [name]. INR is elevated at 1.93. He probably does have chronic liver disease secondary to his [name].     Plan: 1. Cautiously increase Lasix to 60 mg twice a day IV. 2. Gastroenterology consultation. I wonder if he would benefit from intravenous albumin.  Consultants:  None.   Procedures:  None.   Antibiotics:  None.                   Code Status: Full code.  Family Communication: I discussed the plan with the patient at the bedside.   Disposition Plan: Home when medically stable.  Time spent: 20 minutes.   LOS: 1 day   [place] [facility]   03/17/2016, 9:49 AM

## 2016-03-17 NOTE — Consult Note (Signed)
Referring Provider: No ref. provider found Primary Care Physician:  [name], MD Primary Gastroenterologist:  Barney Drain  Reason for Consultation:  LOWER EXTREMITY EDEMA   Impression: ADMITTED WITH LOWER EXTREMITY EDEMA NOW ON LASIX 60 MG IV Q12H.   Plan: 1. ADD ALBUMIN 25 G BID WITH LASIX 60 MG BID FOR 48 HRS. MONITOR K AND Cr. EXPLAINED THE CHALLENGE OF MANAGING FLUID IN PT WITH LIVER DISEASE. 2. PROTONIX DAILY 3. FLUID RESTRICT/Na RESTRICT. 4. DAILY WEIGHTS    HPI:  PT STARTED HAVING LEG SWELLING TUES AND THEN IT GOT WORSE YESTERDAY SO HE CAME TO THE ED. HAS MILD ABDOMINAL PAIN. PT DENIES FEVER, CHILLS, HEMATOCHEZIA, HEMATEMESIS, nausea, vomiting, melena, diarrhea, CHEST PAIN, SHORTNESS OF BREATH,  CHANGE IN BOWEL IN HABITS, constipation, abdominal pain, problems swallowing, OR heartburn or indigestion.   Past Medical History  Diagnosis Date  . Varicose veins   . New onset atrial fibrillation ([place]) 01/02/2015  . Morbid obesity ([place]) 01/02/2015  . Hypertension   . Sleep apnea     been tested but has not received the CPAP yet  . GERD (gastroesophageal reflux disease)   . History of gout   . Cancer ([place]) 02/2015    colon  . Family history of colon cancer   . Family history of breast cancer in mother   . Colon cancer [facility])     March 2016    Past Surgical History  Procedure Laterality Date  . Vein ligation and stripping      left leg  . Colonoscopy N/A 03/28/2015    SLF: 1. Abdominal pain, diarrhea, rectal bleeding due to obstructing colon mass  . Esophagogastroduodenoscopy N/A 03/28/2015    SLF: 1. dysphagia 2. Mild non-erosive gastritis.   . Esophageal dilation N/A 03/28/2015    Procedure: ESOPHAGEAL DILATION;  Surgeon: [name], MD;  Location: [facility] END[facility] SU[facility];  Service: Endoscopy;  Laterality: N/A;  . Partial colectomy N/A 04/03/2015    Procedure: PARTIAL COLECTOMY;  Surgeon: [name], MD;  Location: [facility] ORS;  Service: General;  Laterality: N/A;  .  Portacath placement N/A 05/10/2015    Procedure: INSERTION PORT-A-CATH;  Surgeon: [name], MD;  Location: [facility] ORS;  Service: General;  Laterality: N/A;  left subclavian  . Skin cancer destruction Left 08/16/15    left side of nose and left back  . Colonoscopy N/A 10/03/2015    SLF: normal anastomosis, fair prep (polyps less than 1cm could be missed), anal fissures and verrucous anal canal lesion with benign biopsy. Next TCS in 09/2016.  . Colonoscopy N/A 10/02/2015    SLF: normal anastomosis, poor bowel prep, three anal fissures    Prior to Admission medications   Medication Sig Start Date End Date Taking? Authorizing Provider  albuterol (PROVENTIL HFA;VENTOLIN HFA) 108 (90 Base) MCG/ACT inhaler Inhale 1-2 puffs into the lungs every 6 (six) hours as needed for wheezing or shortness of breath. Reported on 02/20/2016      bumetanide (BUMEX) 2 MG tablet Take 2 mg by mouth 2 (two) times daily.       carbidopa-levodopa (SINEMET IR) 25-100 MG tablet Take 1 tablet by mouth daily. Take 1 tablet once daily x1 week, 1 tablet twice daily x1 week, 1 tablet three times daily.      dabigatran (PRADAXA) 150 MG CAPS capsule Take 150 mg by mouth 2 (two) times daily.      dicyclomine (BENTYL) 10 MG capsule 1 po 30 MINS BEFORE BREAKFAST AND MAY REPEAT BEFORE LUNCH.  Patient taking differently: Take 10 mg by mouth 2 (two) times daily. 1 po 30 MINS BEFORE BREAKFAST AND MAY REPEAT BEFORE LUNCH.      escitalopram (LEXAPRO) 20 MG tablet Take 1 tablet (20 mg total) by mouth daily.      levETIRAcetam (KEPPRA) 500 MG tablet Take 1 tablet (500 mg total) by mouth 2 (two) times daily.      lidocaine-prilocaine (EMLA) cream Apply a quarter size amount to port site 1 hour prior to chemo. Do not rub in. Cover with plastic wrap.      losartan (COZAAR) 100 MG tablet Take 100 mg by mouth daily.      nitroGLYCERIN (NITROGLYN) 2 % ointment Apply 0.5 inches topically as needed for chest pain. Reported on 02/20/2016      omeprazole  (PRILOSEC) 20 MG capsule Take 1 capsule (20 mg total) by mouth daily.      potassium chloride SA (K-DUR,KLOR-CON) 20 MEQ tablet Take 1 tablet (20 mEq total) by mouth daily. Patient taking differently: Take 20 mEq by mouth 2 (two) times daily.       psyllium (METAMUCIL) 58.6 % packet Take 1 packet by mouth daily.        Current Facility-Administered Medications  Medication Dose Route Frequency Provider Last Rate Last Dose  . 0.9 %  sodium chloride infusion  250 mL Intravenous PRN       . acetaminophen (TYLENOL) tablet 650 mg  650 mg Oral Q4H PRN       . albuterol (PROVENTIL) (2.5 MG/3ML) 0.083% nebulizer solution 3 mL  3 mL Inhalation Q6H PRN      . dabigatran (PRADAXA) capsule 150 mg  150 mg Oral BID      . dicyclomine (BENTYL) capsule 10 mg  10 mg Oral BID      . escitalopram (LEXAPRO) tablet 20 mg  20 mg Oral Daily      . furosemide (LASIX) injection 60 mg  60 mg Intravenous Q12H      . levETIRAcetam (KEPPRA) tablet 500 mg  500 mg Oral BID      . nitroGLYCERIN (NITROGLYN) 2 % ointment 0.5 inch  0.5 inch Topical PRN      . ondansetron (ZOFRAN) injection 4 mg  4 mg Intravenous Q6H PRN      . pantoprazole (PROTONIX) EC tablet 40 mg  40 mg Oral Daily      . sodium chloride flush (NS) 0.9 % injection 3 mL  3 mL Intravenous Q12H      . sodium chloride flush (NS) 0.9 % injection 3 mL  3 mL Intravenous PRN         Allergies as of 03/15/2016  . (No Known Allergies)    Family History  Problem Relation Age of Onset  . Breast cancer Mother     dx under 39  . Colon cancer Mother     dx in her mid 2s  . Heart disease Father   . Hyperlipidemia Father   . Liver cancer Father     heavy ETOH user when young  . Cancer Sister     slow "blood" cancer  . Diabetes Sister   . Heart disease Sister   . Diabetes Brother   . Cancer Maternal Aunt     2-3 maternal aunts with Cancer NOS  . Lung cancer Paternal Uncle     three uncles with lung cancer - all smokers  . Cancer Paternal Grandmother      possible bone cancer  Social History   Social History  . Marital Status: Married    Spouse Name: [name]  . Number of Children: 2  . Years of Education: N/A   Occupational History  . Not on file.   Social History Main Topics  . Smoking status: Never Smoker   . Smokeless tobacco: Never Used  . Alcohol Use: No  . Drug Use: No  . Sexual Activity: No   Other Topics Concern  . Not on file   Social History Narrative    Review of Systems: PER HPI OTHERWISE ALL SYSTEMS ARE NEGATIVE.  Vitals: Blood pressure 100/56, pulse 60, temperature 98.2 F (36.8 C), temperature source Oral, resp. rate 20, height 5\' 7"  (1.702 m), weight 263 lb 14.3 oz (119.7 kg), SpO2 100 %.  Physical Exam: General:   Alert,  Well-developed, well-nourished, pleasant and cooperative in NAD Head:  Normocephalic and atraumatic. Eyes:  Sclera clear, no icterus.   Conjunctiva pink. Mouth:  No lesions, dentition normal. Neck:  Supple; no masses. Lungs:  Clear throughout to auscultation.   No wheezes. No acute distress. Heart:  Regular rate and rhythm; no murmurs. Abdomen:  Soft, nontender and nondistended. No masses noted. Normal bowel sounds, without guarding, and without rebound.   Msk:  Symmetrical without gross deformities. Normal posture. Extremities:  With edema-3-4+. Neurologic:  Alert and  oriented x4;  NO  NEW FOCAL DEFICITS Cervical Nodes:  No significant cervical adenopathy. Psych:  Alert and cooperative. Normal mood and FLAT affect.  Lab Results:  Recent Labs  03/16/16 2341 03/17/16 0602  WBC 5.7 5.0  HGB 11.2* 10.4*  HCT 31.2* 29.6*  PLT 111* 108*   BMET  Recent Labs  03/15/16 2341 03/17/16 0602  NA 138 140  K 3.9 3.8  CL 107 106  CO2 26 28  GLUCOSE 99 95  BUN 46* 43*  CREATININE 1.52* 1.57*  CALCIUM 8.7* 8.7*   LFT  Recent Labs  03/17/16 0602  PROT 6.1*  ALBUMIN 2.9*  AST 24  ALT 13*  ALKPHOS 81  BILITOT 1.3*     Studies/Results: FEB 2017 U/S WITH  ELASTOGRAPHY-FIBROSIS, FATTY LIVER   LOS: 1 day   [facility]  03/17/2016, 11:55 AM

## 2016-03-17 NOTE — Telephone Encounter (Signed)
Thank you :)

## 2016-03-18 ENCOUNTER — Inpatient Hospital Stay (HOSPITAL_COMMUNITY): Payer: Medicare Other

## 2016-03-18 DIAGNOSIS — I5033 Acute on chronic diastolic (congestive) heart failure: Secondary | ICD-10-CM

## 2016-03-18 DIAGNOSIS — K7460 Unspecified cirrhosis of liver: Secondary | ICD-10-CM | POA: Diagnosis present

## 2016-03-18 DIAGNOSIS — E8779 Other fluid overload: Secondary | ICD-10-CM

## 2016-03-18 DIAGNOSIS — I482 Chronic atrial fibrillation: Secondary | ICD-10-CM

## 2016-03-18 LAB — GLUCOSE, CAPILLARY: GLUCOSE-CAPILLARY: 84 mg/dL (ref 65–99)

## 2016-03-18 LAB — BASIC METABOLIC PANEL
Anion gap: 7 (ref 5–15)
BUN: 34 mg/dL — AB (ref 6–20)
CO2: 30 mmol/L (ref 22–32)
CREATININE: 1.29 mg/dL — AB (ref 0.61–1.24)
Calcium: 8.7 mg/dL — ABNORMAL LOW (ref 8.9–10.3)
Chloride: 104 mmol/L (ref 101–111)
GFR calc Af Amer: >60 mL/min (ref >60)
GFR, EST NON AFRICAN AMERICAN: 55 mL/min — AB (ref >60)
GLUCOSE: 97 mg/dL (ref 65–99)
Potassium: 3.1 mmol/L — ABNORMAL LOW (ref 3.5–5.1)
SODIUM: 141 mmol/L (ref 135–145)

## 2016-03-18 LAB — HEPATIC FUNCTION PANEL
ALK PHOS: 77 U/L (ref 38–126)
ALT: 13 U/L — ABNORMAL LOW (ref 17–63)
AST: 24 U/L (ref 15–41)
Albumin: 3 g/dL — ABNORMAL LOW (ref 3.5–5.0)
BILIRUBIN DIRECT: 0.2 mg/dL (ref 0.1–0.5)
BILIRUBIN INDIRECT: 1 mg/dL — AB (ref 0.3–0.9)
TOTAL PROTEIN: 6.2 g/dL — AB (ref 6.5–8.1)
Total Bilirubin: 1.2 mg/dL (ref 0.3–1.2)

## 2016-03-18 MED ORDER — POTASSIUM CHLORIDE CRYS ER 20 MEQ PO TBCR
20.0000 meq | EXTENDED_RELEASE_TABLET | Freq: Two times a day (BID) | ORAL | Status: DC
  Administered 2016-03-18 – 2016-03-22 (×9): 20 meq via ORAL
  Filled 2016-03-18 (×9): qty 1

## 2016-03-18 NOTE — Progress Notes (Signed)
    Subjective: No abdominal pain. States he felt like food was sticking in his throat during breakfast (points to neck) after eating breakfast. New onset. No confusion.   Objective: Vital signs in last 24 hours: Temp:  [97.6 F (36.4 C)-98.2 F (36.8 C)] 98.2 F (36.8 C) (03/20 [id number]) Pulse Rate:  [54-61] 54 (03/20 0613) Resp:  [20] 20 (03/20 0613) BP: (99-110)/(45-56) 110/56 mmHg (03/20 0613) SpO2:  [95 %-98 %] 98 % (03/20 0613) Weight:  [259 lb 11.2 oz (117.799 kg)-263 lb 14.3 oz (119.7 kg)] 259 lb 11.2 oz (117.799 kg) (03/19 2210) Last BM Date: 03/17/16 General:   Alert and oriented, pleasant Head:  Normocephalic and atraumatic. Abdomen:  Bowel sounds present, soft, non-tender, non-distended.Limited exam with patient sitting up in chair Extremities:  2+ pitting lower extremity edema  Neurologic:  Alert and  oriented x4 Psych:  Alert and cooperative. Normal mood and affect.  Intake/Output from previous day: 03/19 0701 - 03/20 0700 In: 458 [P.O.:458] Out: 2000 [Urine:2000] Intake/Output this shift:    Lab Results:  Recent Labs  03/16/16 2341 03/17/16 0602  WBC 5.7 5.0  HGB 11.2* 10.4*  HCT 31.2* 29.6*  PLT 111* 108*   BMET  Recent Labs  03/15/16 2341 03/17/16 0602 03/18/16 0651  NA 138 140 141  K 3.9 3.8 3.1*  CL 107 106 104  CO2 26 28 30   GLUCOSE 99 95 97  BUN 46* 43* 34*  CREATININE 1.52* 1.57* 1.29*  CALCIUM 8.7* 8.7* 8.7*   LFT  Recent Labs  03/15/16 2341 03/17/16 0602  PROT 6.4* 6.1*  ALBUMIN 2.9* 2.9*  AST 25 24  ALT 12* 13*  ALKPHOS 80 81  BILITOT 1.0 1.3*   PT/INR  Recent Labs  03/17/16 0602  LABPROT 22.0*  INR 1.93*    Assessment: 70 year old male with likely cirrhosis as evidenced on outpatient elastography, admitted with worsening peripheral edema. Multiple other comorbidities. MELD-Na 19, but this is calculated with elevated INR at 1.93 in setting of anticoagulation (Pradaxa). As of yesterday, weight continues to decrease.  Weights decreasing, last documented yesterday evening. Mild hypokalemia addressed per attending. Presentation multifactorial in setting of multiple comorbidities. Cardiology consult requested per attending. US abdomen also ordered per attending to assess for presence of ascites.   Mild cervical dysphagia: vague, onset this morning. Discussed taking pills one at a time, chewing well, small bites. Monitor for any changes. No odynophagia.    Plan: Protonix daily Fluid and sodium restriction Daily weights Albumin 25 g BID with lasix 60 mg BID for a total of 48 hours, started yesterday.  Recheck HFP today  Orvil Feil, ANP-BC [facility]    LOS: 2 days    03/18/2016, 8:22 AM

## 2016-03-18 NOTE — Progress Notes (Signed)
Subjective: He was admitted with volume overload. This appears to be multifactorial and includes cardiac disease, cirrhosis of the liver and low albumin. He says he feels okay. He is not short of breath. He still has significant swelling of his legs. He has been on outpatient diuretics and this was actually increased but he gained weight while on increased diuretics. He says he's been following his fluid restriction orders  Objective: Vital signs in last 24 hours: Temp:  [97.6 F (36.4 C)-98.2 F (36.8 C)] 98.2 F (36.8 C) (03/20 9924) Pulse Rate:  [54-61] 54 (03/20 0613) Resp:  [20] 20 (03/20 0613) BP: (99-110)/(45-56) 110/56 mmHg (03/20 0613) SpO2:  [95 %-98 %] 98 % (03/20 0613) Weight:  [117.799 kg (259 lb 11.2 oz)-119.7 kg (263 lb 14.3 oz)] 117.799 kg (259 lb 11.2 oz) (03/19 2210) Weight change:  Last BM Date: 03/17/16  Intake/Output from previous day: 03/19 0701 - 03/20 0700 In: 96 [P.O.:458] Out: 2000 [Urine:2000]  PHYSICAL EXAM General appearance: alert, cooperative and morbidly obese Resp: clear to auscultation bilaterally Cardio: irregularly irregular rhythm GI: Soft obese no definite fluid wave Extremities: He has 3+ very tight edema  Lab Results:  Results for orders placed or performed during the hospital encounter of 03/15/16 (from the past 48 hour(s))  Troponin I     Status: None   Collection Time: 03/16/16  9:54 AM  Result Value Ref Range   Troponin I <0.03 <0.031 ng/mL    Comment:        NO INDICATION OF MYOCARDIAL INJURY.   Troponin I     Status: None   Collection Time: 03/16/16  3:32 PM  Result Value Ref Range   Troponin I <0.03 <0.031 ng/mL    Comment:        NO INDICATION OF MYOCARDIAL INJURY.   CBC with Differential     Status: Abnormal   Collection Time: 03/16/16 11:41 PM  Result Value Ref Range   WBC 5.7 4.0 - 10.5 K/uL   RBC 3.30 (L) 4.22 - 5.81 MIL/uL   Hemoglobin 11.2 (L) 13.0 - 17.0 g/dL   HCT 31.2 (L) 39.0 - 52.0 %   MCV 94.5 78.0 -  100.0 fL   MCH 33.9 26.0 - 34.0 pg   MCHC 35.9 30.0 - 36.0 g/dL   RDW 13.1 11.5 - 15.5 %   Platelets 111 (L) 150 - 400 K/uL    Comment: SPECIMEN CHECKED FOR CLOTS   Neutrophils Relative % 50 %   Neutro Abs 2.8 1.7 - 7.7 K/uL   Lymphocytes Relative 32 %   Lymphs Abs 1.8 0.7 - 4.0 K/uL   Monocytes Relative 10 %   Monocytes Absolute 0.6 0.1 - 1.0 K/uL   Eosinophils Relative 7 %   Eosinophils Absolute 0.4 0.0 - 0.7 K/uL   Basophils Relative 0 %   Basophils Absolute 0.0 0.0 - 0.1 K/uL  CBC     Status: Abnormal   Collection Time: 03/17/16  6:02 AM  Result Value Ref Range   WBC 5.0 4.0 - 10.5 K/uL   RBC 3.11 (L) 4.22 - 5.81 MIL/uL   Hemoglobin 10.4 (L) 13.0 - 17.0 g/dL   HCT 29.6 (L) 39.0 - 52.0 %   MCV 95.2 78.0 - 100.0 fL   MCH 33.4 26.0 - 34.0 pg   MCHC 35.1 30.0 - 36.0 g/dL   RDW 13.2 11.5 - 15.5 %   Platelets 108 (L) 150 - 400 K/uL    Comment: SPECIMEN CHECKED FOR CLOTS PLATELET  COUNT CONFIRMED BY SMEAR   Comprehensive metabolic panel     Status: Abnormal   Collection Time: 03/17/16  6:02 AM  Result Value Ref Range   Sodium 140 135 - 145 mmol/L   Potassium 3.8 3.5 - 5.1 mmol/L   Chloride 106 101 - 111 mmol/L   CO2 28 22 - 32 mmol/L   Glucose, Bld 95 65 - 99 mg/dL   BUN 43 (H) 6 - 20 mg/dL   Creatinine, Ser 1.57 (H) 0.61 - 1.24 mg/dL   Calcium 8.7 (L) 8.9 - 10.3 mg/dL   Total Protein 6.1 (L) 6.5 - 8.1 g/dL   Albumin 2.9 (L) 3.5 - 5.0 g/dL   AST 24 15 - 41 U/L   ALT 13 (L) 17 - 63 U/L   Alkaline Phosphatase 81 38 - 126 U/L   Total Bilirubin 1.3 (H) 0.3 - 1.2 mg/dL   GFR calc non Af Amer 43 (L) >60 mL/min   GFR calc Af Amer 50 (L) >60 mL/min    Comment: (NOTE) The eGFR has been calculated using the CKD EPI equation. This calculation has not been validated in all clinical situations. eGFR's persistently <60 mL/min signify possible Chronic Kidney Disease.    Anion gap 6 5 - 15  Protime-INR     Status: Abnormal   Collection Time: 03/17/16  6:02 AM  Result Value Ref  Range   Prothrombin Time 22.0 (H) 11.6 - 15.2 seconds   INR 1.93 (H) 0.00 - 1.27  Basic metabolic panel     Status: Abnormal   Collection Time: 03/18/16  6:51 AM  Result Value Ref Range   Sodium 141 135 - 145 mmol/L   Potassium 3.1 (L) 3.5 - 5.1 mmol/L    Comment: DELTA CHECK NOTED   Chloride 104 101 - 111 mmol/L   CO2 30 22 - 32 mmol/L   Glucose, Bld 97 65 - 99 mg/dL   BUN 34 (H) 6 - 20 mg/dL   Creatinine, Ser 1.29 (H) 0.61 - 1.24 mg/dL   Calcium 8.7 (L) 8.9 - 10.3 mg/dL   GFR calc non Af Amer 55 (L) >60 mL/min   GFR calc Af Amer >60 >60 mL/min    Comment: (NOTE) The eGFR has been calculated using the CKD EPI equation. This calculation has not been validated in all clinical situations. eGFR's persistently <60 mL/min signify possible Chronic Kidney Disease.    Anion gap 7 5 - 15    ABGS No results for input(s): PHART, PO2ART, TCO2, HCO3 in the last 72 hours.  Invalid input(s): PCO2 CULTURES No results found for this or any previous visit (from the past 240 hour(s)). Studies/Results: No results found.  Medications:  Prior to Admission:  Prescriptions prior to admission  Medication Sig Dispense Refill Last Dose  . albuterol (PROVENTIL HFA;VENTOLIN HFA) 108 (90 Base) MCG/ACT inhaler Inhale 1-2 puffs into the lungs every 6 (six) hours as needed for wheezing or shortness of breath. Reported on 02/20/2016   unknown  . bumetanide (BUMEX) 2 MG tablet Take 2 mg by mouth 2 (two) times daily.    03/15/2016 at Unknown time  . carbidopa-levodopa (SINEMET IR) 25-100 MG tablet Take 1 tablet by mouth daily. Take 1 tablet once daily x1 week, 1 tablet twice daily x1 week, 1 tablet three times daily.   03/15/2016 at Unknown time  . dabigatran (PRADAXA) 150 MG CAPS capsule Take 150 mg by mouth 2 (two) times daily.   03/15/2016 at Unknown time  . dicyclomine (BENTYL)  10 MG capsule 1 po 30 MINS BEFORE BREAKFAST AND MAY REPEAT BEFORE LUNCH. (Patient taking differently: Take 10 mg by mouth 2 (two)  times daily. 1 po 30 MINS BEFORE BREAKFAST AND MAY REPEAT BEFORE LUNCH.) 60 capsule 11 03/15/2016 at Unknown time  . escitalopram (LEXAPRO) 20 MG tablet Take 1 tablet (20 mg total) by mouth daily. 30 tablet 5 03/15/2016 at Unknown time  . levETIRAcetam (KEPPRA) 500 MG tablet Take 1 tablet (500 mg total) by mouth 2 (two) times daily. 60 tablet 12 03/15/2016 at Unknown time  . lidocaine-prilocaine (EMLA) cream Apply a quarter size amount to port site 1 hour prior to chemo. Do not rub in. Cover with plastic wrap. 30 g 3 unknown  . losartan (COZAAR) 100 MG tablet Take 100 mg by mouth daily.   03/15/2016 at Unknown time  . nitroGLYCERIN (NITROGLYN) 2 % ointment Apply 0.5 inches topically as needed for chest pain. Reported on 02/20/2016   unknown  . omeprazole (PRILOSEC) 20 MG capsule Take 1 capsule (20 mg total) by mouth daily. 30 capsule 3 03/15/2016 at Unknown time  . potassium chloride SA (K-DUR,KLOR-CON) 20 MEQ tablet Take 1 tablet (20 mEq total) by mouth daily. (Patient taking differently: Take 20 mEq by mouth 2 (two) times daily. ) 90 tablet 3 03/15/2016 at Unknown time  . psyllium (METAMUCIL) 58.6 % packet Take 1 packet by mouth daily.   03/15/2016 at Unknown time   Scheduled: . albumin human  25 g Intravenous BID  . dabigatran  150 mg Oral BID  . dicyclomine  10 mg Oral BID  . escitalopram  20 mg Oral Daily  . furosemide  60 mg Intravenous BID  . levETIRAcetam  500 mg Oral BID  . pantoprazole  40 mg Oral QAC breakfast  . potassium chloride  20 mEq Oral BID  . sodium chloride flush  3 mL Intravenous Q12H   Continuous:  JLL:VDIXVE chloride, acetaminophen, albuterol, nitroGLYCERIN, ondansetron (ZOFRAN) IV, sodium chloride flush  Assesment: He was admitted with volume overload. He has multiple medical problems including acute on chronic diastolic heart failure, [place] and low albumin. He has chronic thrombocytopenia. He has not responded to diuretics as an outpatient. Principal Problem:   Volume  overload Active Problems:   Atrial fibrillation (HCC)   Diastolic CHF, acute on chronic (HCC)   NASH (nonalcoholic steatohepatitis)   Hypoalbuminemia   Normocytic anemia   Thrombocytopenia (HCC)   Acute on chronic diastolic congestive heart failure ([place])    Plan: He is receiving albumin and diuretics now. I have requested cardiology consultation. Replace his potassium which is low. Recheck ultrasound of the abdomen to see if he has developed ascites    LOS: 2 days   Jaysun Wessels L 03/18/2016, 8:38 AM

## 2016-03-18 NOTE — Consult Note (Addendum)
Primary cardiologist: Dr Carlyle Dolly Consulting cardiologist: Dr Carlyle Dolly Requesting physician: Dr Luan Pulling Indication: acute on chronic diastolic HF  Clinical Summary Mr. [name] is a 70 y.o.male with history of colon CA, NASH, afib on pradaxa, and chronic diastolic HF admitted with weight gain, edema, and SOB. Reported 20 lbs weight gain over the last 2 weeks, did not respond to oral diuretics at home. Reports strict medication and dietary compliance.   K 3.1 Cr 1.29 BUN 34 Hgb 10.4 Plt 108 BNP 274 CXR no acute process EKG afib normal rates   No Known Allergies  Medications Scheduled Medications: . albumin human  25 g Intravenous BID  . dabigatran  150 mg Oral BID  . dicyclomine  10 mg Oral BID  . escitalopram  20 mg Oral Daily  . furosemide  60 mg Intravenous BID  . levETIRAcetam  500 mg Oral BID  . pantoprazole  40 mg Oral QAC breakfast  . potassium chloride  20 mEq Oral BID  . sodium chloride flush  3 mL Intravenous Q12H     Infusions:     PRN Medications:  sodium chloride, acetaminophen, albuterol, nitroGLYCERIN, ondansetron (ZOFRAN) IV, sodium chloride flush   Past Medical History  Diagnosis Date  . Varicose veins   . New onset atrial fibrillation ([place]) 01/02/2015  . Morbid obesity ([place]) 01/02/2015  . Hypertension   . Sleep apnea     been tested but has not received the CPAP yet  . GERD (gastroesophageal reflux disease)   . History of gout   . Cancer ([place]) 02/2015    colon  . Family history of colon cancer   . Family history of breast cancer in mother   . Colon cancer [facility])     March 2016    Past Surgical History  Procedure Laterality Date  . Vein ligation and stripping      left leg  . Colonoscopy N/A 03/28/2015    SLF: 1. Abdominal pain, diarrhea, rectal bleeding due to obstructing colon mass  . Esophagogastroduodenoscopy N/A 03/28/2015    SLF: 1. dysphagia 2. Mild non-erosive gastritis.   . Esophageal dilation N/A 03/28/2015   Procedure: ESOPHAGEAL DILATION;  Surgeon: [name], MD;  Location: [facility] ENDO SUITE;  Service: Endoscopy;  Laterality: N/A;  . Partial colectomy N/A 04/03/2015    Procedure: PARTIAL COLECTOMY;  Surgeon: [name], MD;  Location: [facility] ORS;  Service: General;  Laterality: N/A;  . Portacath placement N/A 05/10/2015    Procedure: INSERTION PORT-A-CATH;  Surgeon: [name], MD;  Location: [facility] ORS;  Service: General;  Laterality: N/A;  left subclavian  . Skin cancer destruction Left 08/16/15    left side of nose and left back  . Colonoscopy N/A 10/03/2015    SLF: normal anastomosis, fair prep (polyps less than 1cm could be missed), anal fissures and verrucous anal canal lesion with benign biopsy. Next TCS in 09/2016.  . Colonoscopy N/A 10/02/2015    SLF: normal anastomosis, poor bowel prep, three anal fissures    Family History  Problem Relation Age of Onset  . Breast cancer Mother     dx under 66  . Colon cancer Mother     dx in her mid 25s  . Heart disease Father   . Hyperlipidemia Father   . Liver cancer Father     heavy ETOH user when young  . Cancer Sister     slow "blood" cancer  . Diabetes Sister   . Heart disease  Sister   . Diabetes Brother   . Cancer Maternal Aunt     2-3 maternal aunts with Cancer NOS  . Lung cancer Paternal Uncle     three uncles with lung cancer - all smokers  . Cancer Paternal Grandmother     possible bone cancer    Social History Mr. Keenan reports that he has never smoked. He has never used smokeless tobacco. Mr. Humphreys reports that he does not drink alcohol.  Review of Systems CONSTITUTIONAL: No weight loss, fever, chills, weakness or fatigue.  HEENT: Eyes: No visual loss, blurred vision, double vision or yellow sclerae. No hearing loss, sneezing, congestion, runny nose or sore throat.  SKIN: No rash or itching.  CARDIOVASCULAR: no chest pain, no palpitations.  RESPIRATORY: +SOB GASTROINTESTINAL: No anorexia, nausea, vomiting or  diarrhea. No abdominal pain or blood.  GENITOURINARY: no polyuria, no dysuria NEUROLOGICAL: No headache, dizziness, syncope, paralysis, ataxia, numbness or tingling in the extremities. No change in bowel or bladder control.  MUSCULOSKELETAL: No muscle, back pain, joint pain or stiffness.  HEMATOLOGIC: No anemia, bleeding or bruising.  LYMPHATICS: No enlarged nodes. No history of splenectomy.  PSYCHIATRIC: No history of depression or anxiety.      Physical Examination Blood pressure 110/56, pulse 54, temperature 98.2 F (36.8 C), temperature source Oral, resp. rate 20, height 5\' 7"  (1.702 m), weight 259 lb 11.2 oz (117.799 kg), SpO2 98 %.  Intake/Output Summary (Last 24 hours) at 03/18/16 0945 Last data filed at 03/18/16 0300  Gross per 24 hour  Intake    458 ml  Output   2000 ml  Net  -1542 ml    HEENT: sclera clear, throat clear  Cardiovascular: irreg, rate 55, no m/r/g, no jvd  Respiratory: crackles bilateral bases  GI: abdomen soft, NT, ND  MSK: 2+ bilateral LE edema  Neuro: no focal deficits  Psych: appropriate affect   Lab Results  Basic Metabolic Panel:  Recent Labs Lab 03/11/16 1154 03/15/16 2341 03/17/16 0602 03/18/16 0651  NA 138 138 140 141  K 3.9 3.9 3.8 3.1*  CL 109 107 106 104  CO2 25 26 28 30   GLUCOSE 102* 99 95 97  BUN 38* 46* 43* 34*  CREATININE 1.46* 1.52* 1.57* 1.29*  CALCIUM 8.6* 8.7* 8.7* 8.7*    Liver Function Tests:  Recent Labs Lab 03/11/16 1154 03/15/16 2341 03/17/16 0602  AST 25 25 24   ALT 13* 12* 13*  ALKPHOS 89 80 81  BILITOT 1.1 1.0 1.3*  PROT 6.5 6.4* 6.1*  ALBUMIN 3.0* 2.9* 2.9*    CBC:  Recent Labs Lab 03/11/16 1154 03/16/16 2341 03/17/16 0602  WBC 5.9 5.7 5.0  NEUTROABS 3.1 2.8  --   HGB 11.6* 11.2* 10.4*  HCT 32.9* 31.2* 29.6*  MCV 95.6 94.5 95.2  PLT 105* 111* 108*    Cardiac Enzymes:  Recent Labs Lab 03/16/16 0419 03/16/16 0954 03/16/16 1532  TROPONINI <0.03 <0.03 <0.03     BNP: Invalid input(s): POCBNP     Impression/Recommendations 1. Acute on chronic diastolic HF - echo [id number] LVEF 55-60%, cannot describe diastolic function due to afib,  biatrial enlargement suggests significant diastolic dysfunction - weight in clinic 01/2016 256 lbs, admit weight reportedly 275 lbs. Weight today 259 lbs.  - negative 1.5 liters yesterday, negative 2.8 liters since admit. Reported net diuresis does not correspond to reported weight loss.  - he is on lasix 60mg  IV bid, downtrend in Cr and BUN consistent with venous congestion and CHF. Continue  IV lasix today.   2. Afib - has not required AV nodal agents - he is on pradaxa for stroke prevention  3 Colon CA  4. NASH - per primary team   Carlyle Dolly, M.D.

## 2016-03-19 ENCOUNTER — Encounter (HOSPITAL_COMMUNITY): Payer: Medicare Other

## 2016-03-19 LAB — BASIC METABOLIC PANEL
Anion gap: 8 (ref 5–15)
BUN: 29 mg/dL — AB (ref 6–20)
CO2: 32 mmol/L (ref 22–32)
CREATININE: 1.2 mg/dL (ref 0.61–1.24)
Calcium: 8.8 mg/dL — ABNORMAL LOW (ref 8.9–10.3)
Chloride: 100 mmol/L — ABNORMAL LOW (ref 101–111)
GFR calc Af Amer: >60 mL/min (ref >60)
GFR, EST NON AFRICAN AMERICAN: 60 mL/min — AB (ref >60)
Glucose, Bld: 97 mg/dL (ref 65–99)
Potassium: 3.1 mmol/L — ABNORMAL LOW (ref 3.5–5.1)
SODIUM: 140 mmol/L (ref 135–145)

## 2016-03-19 NOTE — Consult Note (Signed)
   [facility] Inpatient Consult   03/19/2016  KIONTE SURMAN [birth year] [id number]   Spoke with patient and wife at bedside regarding restarting [facility] services. Patient does not want to restart [facility] services at this time, however he does state that the [facility] community case manager had a positive impact on his health and wife voiced agreement. Patient given [facility] brochure and contact information for future reference, voices appreciation of information.    Inpatient case manager aware that patient offered [facility] case management services but declined.   Of note, [facility] Care Management services would not replace or interfere with any services that are arranged by inpatient case management or social work.  For additional questions or referrals please contact:   Royetta Crochet. Laymond Purser, RN, BSN, [facility] Liaison [phone]

## 2016-03-19 NOTE — Progress Notes (Signed)
Subjective: No abdominal pain. Feels like lower extremity edema is improving. Nutrition consult ordered for today. Wife is excited about this.   Objective: Vital signs in last 24 hours: Temp:  [97.5 F (36.4 C)-98.2 F (36.8 C)] 97.7 F (36.5 C) (03/21 0444) Pulse Rate:  [58-61] 58 (03/21 0444) Resp:  [20] 20 (03/21 0444) BP: (104-129)/(52-71) 129/71 mmHg (03/21 0444) SpO2:  [94 %-100 %] 95 % (03/21 0444) Weight:  [254 lb 4.8 oz (115.35 kg)] 254 lb 4.8 oz (115.35 kg) (03/21 0444) Last BM Date: 03/17/16 General:   Alert and oriented, pleasant Head:  Normocephalic and atraumatic. Abdomen:  Bowel sounds present, soft, obese, difficult to appreciate any HSM due to large AP diameter Extremities:  Improvement in lower extremity edema, 1-2+  Neurologic:  Alert and  oriented x4 Psych:  Alert and cooperative. Normal mood and affect.  Intake/Output from previous day: 03/20 0701 - 03/21 0700 In: 680 [P.O.:480; IV Piggyback:200] Out: 2075 [Urine:2075] Intake/Output this shift:    Lab Results:  Recent Labs  03/16/16 2341 03/17/16 0602  WBC 5.7 5.0  HGB 11.2* 10.4*  HCT 31.2* 29.6*  PLT 111* 108*   BMET  Recent Labs  03/17/16 0602 03/18/16 0651 03/19/16 0608  NA 140 141 140  K 3.8 3.1* 3.1*  CL 106 104 100*  CO2 28 30 32  GLUCOSE 95 97 97  BUN 43* 34* 29*  CREATININE 1.57* 1.29* 1.20  CALCIUM 8.7* 8.7* 8.8*   LFT  Recent Labs  03/17/16 0602 03/18/16 0734  PROT 6.1* 6.2*  ALBUMIN 2.9* 3.0*  AST 24 24  ALT 13* 13*  ALKPHOS 81 77  BILITOT 1.3* 1.2  BILIDIR  --  0.2  IBILI  --  1.0*   PT/INR  Recent Labs  03/17/16 0602  LABPROT 22.0*  INR 1.93*     Studies/Results: US Abdomen Complete  03/18/2016  CLINICAL DATA:  Cirrhosis, fatty liver, possible ascites EXAM: ABDOMEN ULTRASOUND COMPLETE COMPARISON:  Abdominal ultrasound dated 02/15/2016 FINDINGS: Gallbladder: No gallstones, gallbladder wall thickening, or pericholecystic fluid. Negative  sonographic Murphy's sign. Common bile duct: Diameter: 4 mm Liver: Hyperechoic hepatic parenchyma with coarse hepatic echotexture. No focal hepatic lesion is seen. IVC: Poorly visualized. Pancreas: Poorly visualized due to overlying bowel gas. Spleen: Size and appearance within normal limits. Right Kidney: Length: 11.6 cm.  No mass or hydronephrosis. Left Kidney: Length: 11.4 cm. 2.5 cm fluid density lesion in the left interpolar region is favored to reflect an extrarenal pelvis when correlating with prior CT. Abdominal aorta: No aneurysm visualized. Other findings: No abdominal ascites. IMPRESSION: Hyperechoic hepatic parenchyma with coarse hepatic echotexture, nonspecific but compatible with hepatocellular disease such as hepatic steatosis and/or cirrhosis. No focal hepatic lesion is seen. No abdominal ascites. Electronically Signed   By: Julian Hy M.D.   On: 03/18/2016 14:30    Assessment: 70 year old male with likely cirrhosis as evidenced on outpatient elastography, admitted with worsening peripheral edema. Multiple other comorbidities. MELD-Na 19, but this is calculated with elevated INR at 1.93 in setting of anticoagulation (Pradaxa). Continues to diurese well. Mild hypokalemia addressed per attending. Presentation multifactorial in setting of multiple comorbidities. Cardiology consult noted. US abdomen without ascites. Has had 3 rounds of Albumin/Lasix thus far.   Mild cervical dysphagia yesterday: now resolved. Discussed taking pills one at a time, small bites, chewing well, sitting upright while eating.   Plan: ~Continue albumin/Lasix (has had a total of 3 rounds as of this morning) ~Nutrition consult requested ~Daily weights ~Outpatient  cirrhosis care, colonoscopy, consider EGD (colonoscopy due in Oct 2017 due to history of colon cancer)  Orvil Feil, ANP-BC [facility]    LOS: 3 days    03/19/2016, 8:15 AM

## 2016-03-19 NOTE — Plan of Care (Signed)
Problem: Food- and Nutrition-Related Knowledge Deficit (NB-1.1) Goal: Nutrition education Formal process to instruct or train a patient/client in a skill or to impart knowledge to help patients/clients voluntarily manage or modify food choices and eating behavior to maintain or improve health. Outcome: Adequate for Discharge Nutrition Education Note  RD consulted to provide nutrition education (specifically MD ordered -Lowfat, 4 gm Sodium with 60-80 gr protein and 1.5 liters daily fluid).  Pt volume overloaded on admission. His problems include acute CHF, Non-alcoholic liver dz, AKI. Pt is up in chair and his wife is here and is ready for education.   At home pt says he is learning to avoid using salt shaker (which has been an adjustment). His wife says they avoid processed foods and gave examples such as frozen meals, they limit eating out and even going to friends for meals due to his diet limitations. She shops, prepares most all of his foods day to day. They eat fresh/frozen vegetables instead of canned.   RD provided multiple handouts which included, " Protein Content of foods (with MD rec's for daily intake), Sodium Content of Foods (with MD rec's for daily intake), Fluid (with MD intake rec's) and provided handout which identified foods that contribute to his fluid intake such as jello, ice cream, soups etc.  Reviewed patient's dietary recall.  Encouraged fresh fruits and vegetables as well as whole grain sources of carbohydrates to maximize fiber intake.   RD discussed why it is important for patient to adhere to diet recommendations, and emphasized the role of fluids, foods to avoid, and importance of weighing self daily. Teach back method used.  Expect good compliance. His spouse is very much engaged in gaining the understanding she needs to follow recommended dietary guidelines.  Body mass index is 39.82 kg/(m^2). Pt meets criteria for obesity class II based on current BMI.  Current  diet order is Heart Healthy with 1200 ml fluid restriction, patient is consuming approximately 75% of meals at this time. Labs and medications reviewed.  FYI: Pt spouse very concerned about the effects of pt protein intake on the efficacy of Levadopa. Discussed with pharmacy who will leave note for MD for recommendation for his discharge regarding the timing of medication administration and meals.  No further nutrition interventions warranted at this time. RD contact information provided if she has additional questions while they're here. Pt spouse says he is scheduled to follow up with outpatient RD-Penny Crumpton.   Colman Cater MS,RD,CSG,LDN Office: [phone] Pager: [phone]

## 2016-03-19 NOTE — Progress Notes (Signed)
Subjective: He says he feels better. Consultants help noted and appreciated.  Objective: Vital signs in last 24 hours: Temp:  [97.5 F (36.4 C)-98.2 F (36.8 C)] 97.7 F (36.5 C) (03/21 0444) Pulse Rate:  [58-61] 58 (03/21 0444) Resp:  [20] 20 (03/21 0444) BP: (104-129)/(52-71) 129/71 mmHg (03/21 0444) SpO2:  [94 %-100 %] 95 % (03/21 0444) Weight:  [115.35 kg (254 lb 4.8 oz)] 115.35 kg (254 lb 4.8 oz) (03/21 0444) Weight change: -4.35 kg (-9 lb 9.5 oz) Last BM Date: 03/17/16  Intake/Output from previous day: 03/20 0701 - 03/21 0700 In: 680 [P.O.:480; IV Piggyback:200] Out: 2075 [Urine:2075]  PHYSICAL EXAM General appearance: alert, cooperative and morbidly obese Resp: clear to auscultation bilaterally Cardio: irregularly irregular rhythm GI: soft, non-tender; bowel sounds normal; no masses,  no organomegaly Extremities: His edema has improved substantially  Lab Results:  Results for orders placed or performed during the hospital encounter of 03/15/16 (from the past 48 hour(s))  Basic metabolic panel     Status: Abnormal   Collection Time: 03/18/16  6:51 AM  Result Value Ref Range   Sodium 141 135 - 145 mmol/L   Potassium 3.1 (L) 3.5 - 5.1 mmol/L    Comment: DELTA CHECK NOTED   Chloride 104 101 - 111 mmol/L   CO2 30 22 - 32 mmol/L   Glucose, Bld 97 65 - 99 mg/dL   BUN 34 (H) 6 - 20 mg/dL   Creatinine, Ser 1.29 (H) 0.61 - 1.24 mg/dL   Calcium 8.7 (L) 8.9 - 10.3 mg/dL   GFR calc non Af Amer 55 (L) >60 mL/min   GFR calc Af Amer >60 >60 mL/min    Comment: (NOTE) The eGFR has been calculated using the CKD EPI equation. This calculation has not been validated in all clinical situations. eGFR's persistently <60 mL/min signify possible Chronic Kidney Disease.    Anion gap 7 5 - 15  Hepatic function panel     Status: Abnormal   Collection Time: 03/18/16  7:34 AM  Result Value Ref Range   Total Protein 6.2 (L) 6.5 - 8.1 g/dL   Albumin 3.0 (L) 3.5 - 5.0 g/dL   AST 24 15  - 41 U/L   ALT 13 (L) 17 - 63 U/L   Alkaline Phosphatase 77 38 - 126 U/L   Total Bilirubin 1.2 0.3 - 1.2 mg/dL   Bilirubin, Direct 0.2 0.1 - 0.5 mg/dL   Indirect Bilirubin 1.0 (H) 0.3 - 0.9 mg/dL  Glucose, capillary     Status: None   Collection Time: 03/18/16  4:57 PM  Result Value Ref Range   Glucose-Capillary 84 65 - 99 mg/dL   Comment 1 Notify RN    Comment 2 Document in Chart   Basic metabolic panel     Status: Abnormal   Collection Time: 03/19/16  6:08 AM  Result Value Ref Range   Sodium 140 135 - 145 mmol/L   Potassium 3.1 (L) 3.5 - 5.1 mmol/L   Chloride 100 (L) 101 - 111 mmol/L   CO2 32 22 - 32 mmol/L   Glucose, Bld 97 65 - 99 mg/dL   BUN 29 (H) 6 - 20 mg/dL   Creatinine, Ser 1.20 0.61 - 1.24 mg/dL   Calcium 8.8 (L) 8.9 - 10.3 mg/dL   GFR calc non Af Amer 60 (L) >60 mL/min   GFR calc Af Amer >60 >60 mL/min    Comment: (NOTE) The eGFR has been calculated using the CKD EPI equation. This calculation  has not been validated in all clinical situations. eGFR's persistently <60 mL/min signify possible Chronic Kidney Disease.    Anion gap 8 5 - 15    ABGS No results for input(s): PHART, PO2ART, TCO2, HCO3 in the last 72 hours.  Invalid input(s): PCO2 CULTURES No results found for this or any previous visit (from the past 240 hour(s)). Studies/Results: US Abdomen Complete  03/18/2016  CLINICAL DATA:  Cirrhosis, fatty liver, possible ascites EXAM: ABDOMEN ULTRASOUND COMPLETE COMPARISON:  Abdominal ultrasound dated 02/15/2016 FINDINGS: Gallbladder: No gallstones, gallbladder wall thickening, or pericholecystic fluid. Negative sonographic Murphy's sign. Common bile duct: Diameter: 4 mm Liver: Hyperechoic hepatic parenchyma with coarse hepatic echotexture. No focal hepatic lesion is seen. IVC: Poorly visualized. Pancreas: Poorly visualized due to overlying bowel gas. Spleen: Size and appearance within normal limits. Right Kidney: Length: 11.6 cm.  No mass or hydronephrosis. Left  Kidney: Length: 11.4 cm. 2.5 cm fluid density lesion in the left interpolar region is favored to reflect an extrarenal pelvis when correlating with prior CT. Abdominal aorta: No aneurysm visualized. Other findings: No abdominal ascites. IMPRESSION: Hyperechoic hepatic parenchyma with coarse hepatic echotexture, nonspecific but compatible with hepatocellular disease such as hepatic steatosis and/or cirrhosis. No focal hepatic lesion is seen. No abdominal ascites. Electronically Signed   By: Julian Hy M.D.   On: 03/18/2016 14:30    Medications:  Prior to Admission:  Prescriptions prior to admission  Medication Sig Dispense Refill Last Dose  . albuterol (PROVENTIL HFA;VENTOLIN HFA) 108 (90 Base) MCG/ACT inhaler Inhale 1-2 puffs into the lungs every 6 (six) hours as needed for wheezing or shortness of breath. Reported on 02/20/2016   unknown  . bumetanide (BUMEX) 2 MG tablet Take 2 mg by mouth 2 (two) times daily.    03/15/2016 at Unknown time  . carbidopa-levodopa (SINEMET IR) 25-100 MG tablet Take 1 tablet by mouth daily. Take 1 tablet once daily x1 week, 1 tablet twice daily x1 week, 1 tablet three times daily.   03/15/2016 at Unknown time  . dabigatran (PRADAXA) 150 MG CAPS capsule Take 150 mg by mouth 2 (two) times daily.   03/15/2016 at Unknown time  . dicyclomine (BENTYL) 10 MG capsule 1 po 30 MINS BEFORE BREAKFAST AND MAY REPEAT BEFORE LUNCH. (Patient taking differently: Take 10 mg by mouth 2 (two) times daily. 1 po 30 MINS BEFORE BREAKFAST AND MAY REPEAT BEFORE LUNCH.) 60 capsule 11 03/15/2016 at Unknown time  . escitalopram (LEXAPRO) 20 MG tablet Take 1 tablet (20 mg total) by mouth daily. 30 tablet 5 03/15/2016 at Unknown time  . levETIRAcetam (KEPPRA) 500 MG tablet Take 1 tablet (500 mg total) by mouth 2 (two) times daily. 60 tablet 12 03/15/2016 at Unknown time  . lidocaine-prilocaine (EMLA) cream Apply a quarter size amount to port site 1 hour prior to chemo. Do not rub in. Cover with  plastic wrap. 30 g 3 unknown  . losartan (COZAAR) 100 MG tablet Take 100 mg by mouth daily.   03/15/2016 at Unknown time  . nitroGLYCERIN (NITROGLYN) 2 % ointment Apply 0.5 inches topically as needed for chest pain. Reported on 02/20/2016   unknown  . omeprazole (PRILOSEC) 20 MG capsule Take 1 capsule (20 mg total) by mouth daily. 30 capsule 3 03/15/2016 at Unknown time  . potassium chloride SA (K-DUR,KLOR-CON) 20 MEQ tablet Take 1 tablet (20 mEq total) by mouth daily. (Patient taking differently: Take 20 mEq by mouth 2 (two) times daily. ) 90 tablet 3 03/15/2016 at Unknown time  .  psyllium (METAMUCIL) 58.6 % packet Take 1 packet by mouth daily.   03/15/2016 at Unknown time   Scheduled: . albumin human  25 g Intravenous BID  . dabigatran  150 mg Oral BID  . dicyclomine  10 mg Oral BID  . escitalopram  20 mg Oral Daily  . furosemide  60 mg Intravenous BID  . levETIRAcetam  500 mg Oral BID  . pantoprazole  40 mg Oral QAC breakfast  . potassium chloride  20 mEq Oral BID  . sodium chloride flush  3 mL Intravenous Q12H   Continuous:  DBN:RWKETI chloride, acetaminophen, albuterol, nitroGLYCERIN, ondansetron (ZOFRAN) IV, sodium chloride flush  Assesment: He was admitted with volume overload and acute on chronic diastolic heart failure. This is complicated by the fact that he has cirrhosis of the liver and low albumin. He has responded to albumin and diuretics and is down about 7 kg. His edema is much better. Principal Problem:   Volume overload Active Problems:   Atrial fibrillation (HCC)   Diastolic CHF, acute on chronic (HCC)   NASH (nonalcoholic steatohepatitis)   Hypoalbuminemia   Normocytic anemia   Thrombocytopenia (HCC)   Acute on chronic diastolic congestive heart failure ([place])   Cirrhosis ([place])    Plan: Continue current treatments. Consult with the dietitian because of the complications of the fact that he has renal failure cirrhosis heart failure and conflicting dietary regimens for  all of these. He is improving. It's possible that he could be discharged in the next 24-48 hours.    LOS: 3 days   Dawid Dupriest L 03/19/2016, 8:59 AM

## 2016-03-19 NOTE — Progress Notes (Signed)
Patient's B/P 96/48,Dr Fanta notified. Orders received,and given. Will continue to monitor patient.[name]

## 2016-03-19 NOTE — Progress Notes (Signed)
Subjective:   SOB improving   Objective:   Temp:  [97.5 F (36.4 C)-98.2 F (36.8 C)] 97.7 F (36.5 C) (03/21 0444) Pulse Rate:  [58-61] 58 (03/21 0444) Resp:  [20] 20 (03/21 0444) BP: (104-129)/(52-71) 129/71 mmHg (03/21 0444) SpO2:  [94 %-100 %] 95 % (03/21 0444) Weight:  [254 lb 4.8 oz (115.35 kg)] 254 lb 4.8 oz (115.35 kg) (03/21 0444) Last BM Date: 03/17/16  Filed Weights   03/17/16 1120 03/17/16 2210 03/19/16 0444  Weight: 263 lb 14.3 oz (119.7 kg) 259 lb 11.2 oz (117.799 kg) 254 lb 4.8 oz (115.35 kg)    Intake/Output Summary (Last 24 hours) at 03/19/16 1017 Last data filed at 03/19/16 0300  Gross per 24 hour  Intake    240 ml  Output   1775 ml  Net  -1535 ml    Exam:  General: NAD  HEENT: sclera clear, throat clear  Resp: CTAB  Cardiac: irreg, no m/r/g, nojvd  GI: abdomen soft, NT, ND  MSK: 1-2+ bilateral LE edema  Neuro: no focal deficits  Psych: appropriate affect  Lab Results:  Basic Metabolic Panel:  Recent Labs Lab 03/17/16 0602 03/18/16 0651 03/19/16 0608  NA 140 141 140  K 3.8 3.1* 3.1*  CL 106 104 100*  CO2 28 30 32  GLUCOSE 95 97 97  BUN 43* 34* 29*  CREATININE 1.57* 1.29* 1.20  CALCIUM 8.7* 8.7* 8.8*    Liver Function Tests:  Recent Labs Lab 03/15/16 2341 03/17/16 0602 03/18/16 0734  AST 25 24 24   ALT 12* 13* 13*  ALKPHOS 80 81 77  BILITOT 1.0 1.3* 1.2  PROT 6.4* 6.1* 6.2*  ALBUMIN 2.9* 2.9* 3.0*    CBC:  Recent Labs Lab 03/16/16 2341 03/17/16 0602  WBC 5.7 5.0  HGB 11.2* 10.4*  HCT 31.2* 29.6*  MCV 94.5 95.2  PLT 111* 108*    Cardiac Enzymes:  Recent Labs Lab 03/16/16 0419 03/16/16 0954 03/16/16 1532  TROPONINI <0.03 <0.03 <0.03    BNP: No results for input(s): PROBNP in the last 8760 hours.  Coagulation:  Recent Labs Lab 03/17/16 0602  INR 1.93*    ECG:   Medications:   Scheduled Medications: . albumin human  25 g Intravenous BID  . dabigatran  150 mg Oral BID  .  dicyclomine  10 mg Oral BID  . escitalopram  20 mg Oral Daily  . furosemide  60 mg Intravenous BID  . levETIRAcetam  500 mg Oral BID  . pantoprazole  40 mg Oral QAC breakfast  . potassium chloride  20 mEq Oral BID  . sodium chloride flush  3 mL Intravenous Q12H     Infusions:     PRN Medications:  sodium chloride, acetaminophen, albuterol, nitroGLYCERIN, ondansetron (ZOFRAN) IV, sodium chloride flush     Assessment/Plan    1. Acute on chronic diastolic HF - echo [id number] LVEF 55-60%, cannot describe diastolic function due to afib, biatrial enlargement suggests significant diastolic dysfunction - weight in clinic 01/2016 256 lbs, admit weight reportedly 275 lbs. Weight today 254 lbs.  - negative 1.4 liters yesterday, negative 4.2 liters since admit. Reported net diuresis does not correspond to reported weight loss.  - he is on lasix 60mg  IV bid, downtrend in Cr and BUN consistent with venous congestion and CHF. Continue IV lasix today. He is being dosed with albumin as well by GI given liver disease  2. Afib - has not required AV nodal agents - he  is on pradaxa for stroke prevention  3 Colon CA  4. NASH - per primary team and GI  5. Hypokalemia - written for oral replacement today     Carlyle Dolly, M.D.

## 2016-03-20 LAB — COMPREHENSIVE METABOLIC PANEL
ALBUMIN: 3.8 g/dL (ref 3.5–5.0)
ALK PHOS: 78 U/L (ref 38–126)
ALT: 13 U/L — ABNORMAL LOW (ref 17–63)
ANION GAP: 6 (ref 5–15)
AST: 25 U/L (ref 15–41)
BILIRUBIN TOTAL: 1.5 mg/dL — AB (ref 0.3–1.2)
BUN: 25 mg/dL — ABNORMAL HIGH (ref 6–20)
CALCIUM: 8.9 mg/dL (ref 8.9–10.3)
CO2: 33 mmol/L — ABNORMAL HIGH (ref 22–32)
Chloride: 102 mmol/L (ref 101–111)
Creatinine, Ser: 1.17 mg/dL (ref 0.61–1.24)
GFR calc non Af Amer: >60 mL/min (ref >60)
GLUCOSE: 99 mg/dL (ref 65–99)
POTASSIUM: 3.6 mmol/L (ref 3.5–5.1)
SODIUM: 141 mmol/L (ref 135–145)
TOTAL PROTEIN: 7 g/dL (ref 6.5–8.1)

## 2016-03-20 NOTE — Progress Notes (Signed)
[name], male   DOB: 09/02/1946, 70 y.o.   MRN: [id number]      Subjective:   Sitting in chair with no complaints Slept well    Objective:   Temp:  [97.7 F (36.5 C)-98.3 F (36.8 C)] 97.9 F (36.6 C) (03/22 0411) Pulse Rate:  [57-59] 59 (03/22 0411) Resp:  [20] 20 (03/22 0411) BP: (96-113)/(48-82) 113/82 mmHg (03/22 0411) SpO2:  [98 %-100 %] 98 % (03/22 0411) Weight:  [115.395 kg (254 lb 6.4 oz)] 115.395 kg (254 lb 6.4 oz) (03/22 0411) Last BM Date: 03/17/16  Filed Weights   03/17/16 2210 03/19/16 0444 03/20/16 0411  Weight: 117.799 kg (259 lb 11.2 oz) 115.35 kg (254 lb 4.8 oz) 115.395 kg (254 lb 6.4 oz)    Intake/Output Summary (Last 24 hours) at 03/20/16 0854 Last data filed at 03/19/16 1900  Gross per 24 hour  Intake    600 ml  Output    600 ml  Net      0 ml    Exam:  Affect appropriate Chronically ill white male  HEENT: normal Neck supple with no adenopathy JVP normal no bruits no thyromegaly Lungs clear with no wheezing and good diaphragmatic motion Heart:  S1/S2 no murmur, no rub, gallop or click PMI normal Abdomen: benighn, BS positve, no tenderness, no AAA no bruit.  No HSM or HJR Distal pulses intact with no bruits Plus 3 bilteral LE  edema Neuro non-focal Skin warm and dry No muscular weakness   Lab Results:  Basic Metabolic Panel:  Recent Labs Lab 03/18/16 0651 03/19/16 0608 03/20/16 0808  NA 141 140 141  K 3.1* 3.1* 3.6  CL 104 100* 102  CO2 30 32 33*  GLUCOSE 97 97 99  BUN 34* 29* 25*  CREATININE 1.29* 1.20 1.17  CALCIUM 8.7* 8.8* 8.9    Liver Function Tests:  Recent Labs Lab 03/17/16 0602 03/18/16 0734 03/20/16 0808  AST 24 24 25   ALT 13* 13* 13*  ALKPHOS 81 77 78  BILITOT 1.3* 1.2 1.5*  PROT 6.1* 6.2* 7.0  ALBUMIN 2.9* 3.0* 3.8    CBC:  Recent Labs Lab 03/16/16 2341 03/17/16 0602  WBC 5.7 5.0  HGB 11.2* 10.4*  HCT 31.2* 29.6*  MCV 94.5 95.2  PLT 111* 108*    Cardiac  Enzymes:  Recent Labs Lab 03/16/16 0419 03/16/16 0954 03/16/16 1532  TROPONINI <0.03 <0.03 <0.03     Coagulation:  Recent Labs Lab 03/17/16 0602  INR 1.93*    ECG:   Medications:   Scheduled Medications: . albumin human  25 g Intravenous BID  . dabigatran  150 mg Oral BID  . dicyclomine  10 mg Oral BID  . escitalopram  20 mg Oral Daily  . furosemide  60 mg Intravenous BID  . levETIRAcetam  500 mg Oral BID  . pantoprazole  40 mg Oral QAC breakfast  . potassium chloride  20 mEq Oral BID  . sodium chloride flush  3 mL Intravenous Q12H    Infusions:    PRN Medications: sodium chloride, acetaminophen, albuterol, nitroGLYCERIN, ondansetron (ZOFRAN) IV, sodium chloride flush     Assessment/Plan    1. Acute on chronic diastolic HF - echo 123XX123 LVEF 55-60%, cannot describe diastolic function due to afib, biatrial enlargement suggests significant diastolic dysfunction -continue iv diuresis   2. Afib - has not required AV nodal agents - he is on pradaxa for stroke prevention  3 Colon CA  4. NASH - per  primary team and GI contributes to anasarca consider adding aldactone      Jenkins Rouge

## 2016-03-20 NOTE — Progress Notes (Signed)
Medication: Sinemet  Assessment:  Patient and wife concerned with initiation of sinemet and proteins Discussion:Eating Proteins with Levodopa/Sinemet* It is best to take Sinemet 30 to 60 minutes before eating a meal. This allows the Sinemet to be quickly absorbed before the food can interfere.  Take the Sinemet along with foods that don't contain proteins.  Ginger tea is a good choice for many people, because it often "settles the stomach".  A graham cracker or soda cracker along with the ginger tea may help too.  These foods are very low in protein and should not interfere with the absorption of Sinemet. Plan:  Continue with consistent protein diet and if use protein shakes, do not give within 2 hours after consuming shake or at least 1 hour before. Optimally take sinemet 60 minutes before eating to allow absorption of food.  Thanks for allowing me to participate in the care of this patient,  Isac Sarna, BS [place]ena [place], [place] Pharmacist Pager [phone] 03/20/2016

## 2016-03-20 NOTE — Progress Notes (Signed)
Subjective: He was admitted with volume overload which is multifactorial. He does have a history of heart failure liver disease and has had low albumin. He's been receiving albumin and Lasix and has had significant diuresis. He is down about 10 kg. He is somewhat hypotensive this morning so I think are probably going to need to cut back on his diuretics. He says he is dizzy  Objective: Vital signs in last 24 hours: Temp:  [97.7 F (36.5 C)-98.3 F (36.8 C)] 97.9 F (36.6 C) (03/22 0411) Pulse Rate:  [57-59] 59 (03/22 0411) Resp:  [20] 20 (03/22 0411) BP: (96-113)/(48-82) 113/82 mmHg (03/22 0411) SpO2:  [98 %-100 %] 98 % (03/22 0411) Weight:  [115.395 kg (254 lb 6.4 oz)] 115.395 kg (254 lb 6.4 oz) (03/22 0411) Weight change: 0.045 kg (1.6 oz) Last BM Date: 03/17/16  Intake/Output from previous day: 03/21 0701 - 03/22 0700 In: 600 [P.O.:600] Out: 600 [Urine:600]  PHYSICAL EXAM General appearance: alert, cooperative, no distress and morbidly obese Resp: clear to auscultation bilaterally Cardio: irregularly irregular rhythm GI: soft, non-tender; bowel sounds normal; no masses,  no organomegaly Extremities: His edema is essentially resolved  Lab Results:  Results for orders placed or performed during the hospital encounter of 03/15/16 (from the past 48 hour(s))  Glucose, capillary     Status: None   Collection Time: 03/18/16  4:57 PM  Result Value Ref Range   Glucose-Capillary 84 65 - 99 mg/dL   Comment 1 Notify RN    Comment 2 Document in Chart   Basic metabolic panel     Status: Abnormal   Collection Time: 03/19/16  6:08 AM  Result Value Ref Range   Sodium 140 135 - 145 mmol/L   Potassium 3.1 (L) 3.5 - 5.1 mmol/L   Chloride 100 (L) 101 - 111 mmol/L   CO2 32 22 - 32 mmol/L   Glucose, Bld 97 65 - 99 mg/dL   BUN 29 (H) 6 - 20 mg/dL   Creatinine, Ser 1.20 0.61 - 1.24 mg/dL   Calcium 8.8 (L) 8.9 - 10.3 mg/dL   GFR calc non Af Amer 60 (L) >60 mL/min   GFR calc Af Amer >60 >60  mL/min    Comment: (NOTE) The eGFR has been calculated using the CKD EPI equation. This calculation has not been validated in all clinical situations. eGFR's persistently <60 mL/min signify possible Chronic Kidney Disease.    Anion gap 8 5 - 15    ABGS No results for input(s): PHART, PO2ART, TCO2, HCO3 in the last 72 hours.  Invalid input(s): PCO2 CULTURES No results found for this or any previous visit (from the past 240 hour(s)). Studies/Results: US Abdomen Complete  03/18/2016  CLINICAL DATA:  Cirrhosis, fatty liver, possible ascites EXAM: ABDOMEN ULTRASOUND COMPLETE COMPARISON:  Abdominal ultrasound dated 02/15/2016 FINDINGS: Gallbladder: No gallstones, gallbladder wall thickening, or pericholecystic fluid. Negative sonographic Murphy's sign. Common bile duct: Diameter: 4 mm Liver: Hyperechoic hepatic parenchyma with coarse hepatic echotexture. No focal hepatic lesion is seen. IVC: Poorly visualized. Pancreas: Poorly visualized due to overlying bowel gas. Spleen: Size and appearance within normal limits. Right Kidney: Length: 11.6 cm.  No mass or hydronephrosis. Left Kidney: Length: 11.4 cm. 2.5 cm fluid density lesion in the left interpolar region is favored to reflect an extrarenal pelvis when correlating with prior CT. Abdominal aorta: No aneurysm visualized. Other findings: No abdominal ascites. IMPRESSION: Hyperechoic hepatic parenchyma with coarse hepatic echotexture, nonspecific but compatible with hepatocellular disease such as hepatic steatosis and/or  cirrhosis. No focal hepatic lesion is seen. No abdominal ascites. Electronically Signed   By: Julian Hy M.D.   On: 03/18/2016 14:30    Medications:  Prior to Admission:  Prescriptions prior to admission  Medication Sig Dispense Refill Last Dose  . albuterol (PROVENTIL HFA;VENTOLIN HFA) 108 (90 Base) MCG/ACT inhaler Inhale 1-2 puffs into the lungs every 6 (six) hours as needed for wheezing or shortness of breath. Reported on  02/20/2016   unknown  . bumetanide (BUMEX) 2 MG tablet Take 2 mg by mouth 2 (two) times daily.    03/15/2016 at Unknown time  . carbidopa-levodopa (SINEMET IR) 25-100 MG tablet Take 1 tablet by mouth daily. Take 1 tablet once daily x1 week, 1 tablet twice daily x1 week, 1 tablet three times daily.   03/15/2016 at Unknown time  . dabigatran (PRADAXA) 150 MG CAPS capsule Take 150 mg by mouth 2 (two) times daily.   03/15/2016 at Unknown time  . dicyclomine (BENTYL) 10 MG capsule 1 po 30 MINS BEFORE BREAKFAST AND MAY REPEAT BEFORE LUNCH. (Patient taking differently: Take 10 mg by mouth 2 (two) times daily. 1 po 30 MINS BEFORE BREAKFAST AND MAY REPEAT BEFORE LUNCH.) 60 capsule 11 03/15/2016 at Unknown time  . escitalopram (LEXAPRO) 20 MG tablet Take 1 tablet (20 mg total) by mouth daily. 30 tablet 5 03/15/2016 at Unknown time  . levETIRAcetam (KEPPRA) 500 MG tablet Take 1 tablet (500 mg total) by mouth 2 (two) times daily. 60 tablet 12 03/15/2016 at Unknown time  . lidocaine-prilocaine (EMLA) cream Apply a quarter size amount to port site 1 hour prior to chemo. Do not rub in. Cover with plastic wrap. 30 g 3 unknown  . losartan (COZAAR) 100 MG tablet Take 100 mg by mouth daily.   03/15/2016 at Unknown time  . nitroGLYCERIN (NITROGLYN) 2 % ointment Apply 0.5 inches topically as needed for chest pain. Reported on 02/20/2016   unknown  . omeprazole (PRILOSEC) 20 MG capsule Take 1 capsule (20 mg total) by mouth daily. 30 capsule 3 03/15/2016 at Unknown time  . potassium chloride SA (K-DUR,KLOR-CON) 20 MEQ tablet Take 1 tablet (20 mEq total) by mouth daily. (Patient taking differently: Take 20 mEq by mouth 2 (two) times daily. ) 90 tablet 3 03/15/2016 at Unknown time  . psyllium (METAMUCIL) 58.6 % packet Take 1 packet by mouth daily.   03/15/2016 at Unknown time   Scheduled: . albumin human  25 g Intravenous BID  . dabigatran  150 mg Oral BID  . dicyclomine  10 mg Oral BID  . escitalopram  20 mg Oral Daily  . furosemide   60 mg Intravenous BID  . levETIRAcetam  500 mg Oral BID  . pantoprazole  40 mg Oral QAC breakfast  . potassium chloride  20 mEq Oral BID  . sodium chloride flush  3 mL Intravenous Q12H   Continuous:  MWU:[id number] chloride, acetaminophen, albuterol, nitroGLYCERIN, ondansetron (ZOFRAN) IV, sodium chloride flush  Assesment: He was admitted with volume overload and acute on chronic diastolic heart failure. He has multiple other problems as noted. His albumin level has been low when he's been receiving replacement. He is significantly better. He is down 10 kg. Laboratory work this morning is pending. If his albumin is up then I think we can discontinue the albumin and perhaps reduce his diuretics. Because he's unsteady on his feet I will ask physical therapy to see him. He had dietary consultation yesterday but his wife still has some questions Principal  Problem:   Volume overload Active Problems:   Morbid obesity ([place])   Colon carcinoma (HCC)   Malignant neoplasm of colon ([place])   Peripheral edema   Atrial fibrillation (HCC)   Spondylosis, cervical, with myelopathy   Diastolic CHF, acute on chronic (HCC)   NASH (nonalcoholic steatohepatitis)   Hypoalbuminemia   Normocytic anemia   Thrombocytopenia (HCC)   Acute on chronic diastolic congestive heart failure ([place])   Cirrhosis ([place])    Plan: Continue current treatments. Await comprehensive metabolic profile    LOS: 4 days   Celise Bazar L 03/20/2016, 8:41 AM

## 2016-03-20 NOTE — Progress Notes (Signed)
PT Cancellation Note  Patient Details Name: [name] MRN: [id number] DOB: 06/26/1946   Cancelled Treatment:    Reason Eval/Treat Not Completed: Other (comment).  Pt reports feeling slightly dizzy (better now than it was earlier) and prefers to wait until tomorrow for PT.  His BP was taken and found to be 103/64.  Discussed with RN.  Pt has been up in the room with his walker, wife and nursing assist intermittently through the day.  He has been receiving OP PT for strengthening and balance.   [name]  PT 03/20/2016, 1:11 PM

## 2016-03-21 ENCOUNTER — Encounter (HOSPITAL_COMMUNITY): Payer: Medicare Other

## 2016-03-21 ENCOUNTER — Telehealth: Payer: Self-pay | Admitting: Gastroenterology

## 2016-03-21 DIAGNOSIS — R601 Generalized edema: Secondary | ICD-10-CM | POA: Diagnosis present

## 2016-03-21 DIAGNOSIS — E8770 Fluid overload, unspecified: Secondary | ICD-10-CM

## 2016-03-21 LAB — BASIC METABOLIC PANEL
Anion gap: 6 (ref 5–15)
BUN: 24 mg/dL — ABNORMAL HIGH (ref 6–20)
CALCIUM: 8.8 mg/dL — AB (ref 8.9–10.3)
CO2: 32 mmol/L (ref 22–32)
Chloride: 102 mmol/L (ref 101–111)
Creatinine, Ser: 1.25 mg/dL — ABNORMAL HIGH (ref 0.61–1.24)
GFR calc Af Amer: >60 mL/min (ref >60)
GFR, EST NON AFRICAN AMERICAN: 57 mL/min — AB (ref >60)
Glucose, Bld: 132 mg/dL — ABNORMAL HIGH (ref 65–99)
POTASSIUM: 3.4 mmol/L — AB (ref 3.5–5.1)
Sodium: 140 mmol/L (ref 135–145)

## 2016-03-21 MED ORDER — MECLIZINE HCL 12.5 MG PO TABS
25.0000 mg | ORAL_TABLET | Freq: Three times a day (TID) | ORAL | Status: DC | PRN
Start: 2016-03-21 — End: 2016-03-22
  Administered 2016-03-21: 25 mg via ORAL
  Filled 2016-03-21: qty 2

## 2016-03-21 MED ORDER — FUROSEMIDE 40 MG PO TABS
60.0000 mg | ORAL_TABLET | Freq: Every day | ORAL | Status: DC
  Administered 2016-03-21: 60 mg via ORAL
  Filled 2016-03-21: qty 1

## 2016-03-21 MED ORDER — SPIRONOLACTONE 25 MG PO TABS
12.5000 mg | ORAL_TABLET | Freq: Two times a day (BID) | ORAL | Status: DC
Start: 2016-03-21 — End: 2016-03-22
  Administered 2016-03-21 – 2016-03-22 (×3): 12.5 mg via ORAL
  Filled 2016-03-21 (×3): qty 1

## 2016-03-21 NOTE — Progress Notes (Signed)
Subjective: He complains of some dizziness while lying flat that gets worse when he sits up. Otherwise he feels pretty well. He is not short of breath. No other new complaints.  Objective: Vital signs in last 24 hours: Temp:  [97.6 F (36.4 C)-98.5 F (36.9 C)] 98.5 F (36.9 C) (03/23 0504) Pulse Rate:  [55-64] 64 (03/23 0504) Resp:  [16] 16 (03/23 0504) BP: (97-111)/(61-73) 97/67 mmHg (03/23 0504) SpO2:  [97 %-98 %] 98 % (03/23 0504) Weight:  [114.397 kg (252 lb 3.2 oz)] 114.397 kg (252 lb 3.2 oz) (03/23 0504) Weight change: -0.998 kg (-2 lb 3.2 oz) Last BM Date: 03/17/16  Intake/Output from previous day: 03/22 0701 - 03/23 0700 In: 720 [P.O.:720] Out: 1575 [Urine:1575]  PHYSICAL EXAM General appearance: alert, cooperative and morbidly obese Resp: clear to auscultation bilaterally Cardio: irregularly irregular rhythm GI: His abdomen is soft. He may have some fluid intra-abdominal and he still has a little bit of edema of the abdominal wall Extremities: His edema is much improved in his extremities  Lab Results:  Results for orders placed or performed during the hospital encounter of 03/15/16 (from the past 48 hour(s))  Comprehensive metabolic panel     Status: Abnormal   Collection Time: 03/20/16  8:08 AM  Result Value Ref Range   Sodium 141 135 - 145 mmol/L   Potassium 3.6 3.5 - 5.1 mmol/L   Chloride 102 101 - 111 mmol/L   CO2 33 (H) 22 - 32 mmol/L   Glucose, Bld 99 65 - 99 mg/dL   BUN 25 (H) 6 - 20 mg/dL   Creatinine, Ser 1.17 0.61 - 1.24 mg/dL   Calcium 8.9 8.9 - 10.3 mg/dL   Total Protein 7.0 6.5 - 8.1 g/dL   Albumin 3.8 3.5 - 5.0 g/dL   AST 25 15 - 41 U/L   ALT 13 (L) 17 - 63 U/L   Alkaline Phosphatase 78 38 - 126 U/L   Total Bilirubin 1.5 (H) 0.3 - 1.2 mg/dL   GFR calc non Af Amer >60 >60 mL/min   GFR calc Af Amer >60 >60 mL/min    Comment: (NOTE) The eGFR has been calculated using the CKD EPI equation. This calculation has not been validated in all  clinical situations. eGFR's persistently <60 mL/min signify possible Chronic Kidney Disease.    Anion gap 6 5 - 15    ABGS No results for input(s): PHART, PO2ART, TCO2, HCO3 in the last 72 hours.  Invalid input(s): PCO2 CULTURES No results found for this or any previous visit (from the past 240 hour(s)). Studies/Results: No results found.  Medications:  Prior to Admission:  Prescriptions prior to admission  Medication Sig Dispense Refill Last Dose  . albuterol (PROVENTIL HFA;VENTOLIN HFA) 108 (90 Base) MCG/ACT inhaler Inhale 1-2 puffs into the lungs every 6 (six) hours as needed for wheezing or shortness of breath. Reported on 02/20/2016   unknown  . bumetanide (BUMEX) 2 MG tablet Take 2 mg by mouth 2 (two) times daily.    03/15/2016 at Unknown time  . carbidopa-levodopa (SINEMET IR) 25-100 MG tablet Take 1 tablet by mouth daily. Take 1 tablet once daily x1 week, 1 tablet twice daily x1 week, 1 tablet three times daily.   03/15/2016 at Unknown time  . dabigatran (PRADAXA) 150 MG CAPS capsule Take 150 mg by mouth 2 (two) times daily.   03/15/2016 at Unknown time  . dicyclomine (BENTYL) 10 MG capsule 1 po 30 MINS BEFORE BREAKFAST AND MAY REPEAT BEFORE  LUNCH. (Patient taking differently: Take 10 mg by mouth 2 (two) times daily. 1 po 30 MINS BEFORE BREAKFAST AND MAY REPEAT BEFORE LUNCH.) 60 capsule 11 03/15/2016 at Unknown time  . escitalopram (LEXAPRO) 20 MG tablet Take 1 tablet (20 mg total) by mouth daily. 30 tablet 5 03/15/2016 at Unknown time  . levETIRAcetam (KEPPRA) 500 MG tablet Take 1 tablet (500 mg total) by mouth 2 (two) times daily. 60 tablet 12 03/15/2016 at Unknown time  . lidocaine-prilocaine (EMLA) cream Apply a quarter size amount to port site 1 hour prior to chemo. Do not rub in. Cover with plastic wrap. 30 g 3 unknown  . losartan (COZAAR) 100 MG tablet Take 100 mg by mouth daily.   03/15/2016 at Unknown time  . nitroGLYCERIN (NITROGLYN) 2 % ointment Apply 0.5 inches topically as  needed for chest pain. Reported on 02/20/2016   unknown  . omeprazole (PRILOSEC) 20 MG capsule Take 1 capsule (20 mg total) by mouth daily. 30 capsule 3 03/15/2016 at Unknown time  . potassium chloride SA (K-DUR,KLOR-CON) 20 MEQ tablet Take 1 tablet (20 mEq total) by mouth daily. (Patient taking differently: Take 20 mEq by mouth 2 (two) times daily. ) 90 tablet 3 03/15/2016 at Unknown time  . psyllium (METAMUCIL) 58.6 % packet Take 1 packet by mouth daily.   03/15/2016 at Unknown time   Scheduled: . dabigatran  150 mg Oral BID  . dicyclomine  10 mg Oral BID  . escitalopram  20 mg Oral Daily  . furosemide  60 mg Oral Daily  . levETIRAcetam  500 mg Oral BID  . pantoprazole  40 mg Oral QAC breakfast  . potassium chloride  20 mEq Oral BID  . sodium chloride flush  3 mL Intravenous Q12H  . spironolactone  12.5 mg Oral BID   Continuous:  GTX:MIWOEH chloride, acetaminophen, albuterol, meclizine, nitroGLYCERIN, ondansetron (ZOFRAN) IV, sodium chloride flush  Assesment: He was admitted with anasarca. He is doing better. He still has some fluid overload. His lowest weight in the last 6 months or so was 244 and he is 252 today. He had low albumin and that has been replaced and is back to normal now. His renal function has improved with diuresis. He remains in atrial fibrillation. He is chronically anticoagulated. He is having dizziness now probably related to his significant diuresis. Principal Problem:   Volume overload Active Problems:   Morbid obesity ([place])   Colon carcinoma (HCC)   Malignant neoplasm of colon ([place])   Peripheral edema   Atrial fibrillation (HCC)   Spondylosis, cervical, with myelopathy   Diastolic CHF, acute on chronic (HCC)   NASH (nonalcoholic steatohepatitis)   Hypoalbuminemia   Normocytic anemia   Thrombocytopenia (HCC)   Acute on chronic diastolic congestive heart failure (HCC)   Cirrhosis ([place])    Plan: Switch him to oral diuresis. Add Aldactone. He can have Antivert  as needed for dizziness. He is going to try to be more active within the limitations of his dizziness. He will have orthostatic vital signs checked. I had hoped to get him home today but with this significant dizziness I think it's not safe    LOS: 5 days   Colter Magowan L 03/21/2016, 8:40 AM

## 2016-03-21 NOTE — Telephone Encounter (Signed)
Needs hospital follow for cirrhosis care, ?egd/tcs in 2 months.

## 2016-03-21 NOTE — Progress Notes (Signed)
Subjective:  Overall feels better. Less dizzy today. Ambulated in the hallway. Appetite good. Less LE edema.   Objective: Vital signs in last 24 hours: Temp:  [97.5 F (36.4 C)-98.5 F (36.9 C)] 97.5 F (36.4 C) (03/23 1149) Pulse Rate:  [55-64] 57 (03/23 1149) Resp:  [16-20] 20 (03/23 1149) BP: (97-108)/(61-73) 97/67 mmHg (03/23 0504) SpO2:  [97 %-100 %] 100 % (03/23 1149) Weight:  [252 lb 3.2 oz (114.397 kg)] 252 lb 3.2 oz (114.397 kg) (03/23 0504) Last BM Date: 03/17/16 General:   Alert,  Well-developed, well-nourished, pleasant and cooperative in NAD Head:  Normocephalic and atraumatic. Eyes:  Sclera clear, no icterus.   Abdomen:  Soft, nontender and nondistended.  Normal bowel sounds, without guarding, and without rebound.   Extremities:  Without clubbing, deformity. 2+ edema. Neurologic:  Alert and  oriented x4;  grossly normal neurologically. Skin:  Intact without significant lesions or rashes. Psych:  Alert and cooperative. Normal mood and affect.  Intake/Output from previous day: 03/22 0701 - 03/23 0700 In: 720 [P.O.:720] Out: 1575 [Urine:1575] Intake/Output this shift:    Lab Results: CBC No results for input(s): WBC, HGB, HCT, MCV, PLT in the last 72 hours. BMET  Recent Labs  03/19/16 0608 03/20/16 0808 03/21/16 0843  NA 140 141 140  K 3.1* 3.6 3.4*  CL 100* 102 102  CO2 32 33* 32  GLUCOSE 97 99 132*  BUN 29* 25* 24*  CREATININE 1.20 1.17 1.25*  CALCIUM 8.8* 8.9 8.8*   LFTs  Recent Labs  03/20/16 0808  BILITOT 1.5*  ALKPHOS 78  AST 25  ALT 13*  PROT 7.0  ALBUMIN 3.8   No results for input(s): LIPASE in the last 72 hours. PT/INR No results for input(s): LABPROT, INR in the last 72 hours.    Imaging Studies: Dg Chest 2 View  03/16/2016  CLINICAL DATA:  Acute onset of bilateral lower extremity edema. Initial encounter. EXAM: CHEST  2 VIEW COMPARISON:  Chest radiograph performed 12/14/2015 FINDINGS: The lungs are mildly  hypoexpanded. Mild right basilar atelectasis is noted. No pleural effusion or pneumothorax is seen. The cardiomediastinal silhouette is borderline normal in size. No acute osseous abnormalities are identified. A left-sided chest port is noted ending about the proximal SVC. IMPRESSION: Lungs mildly hypoexpanded, with mild right basilar atelectasis. Electronically Signed   By: Garald Balding M.D.   On: 03/16/2016 00:05   US Abdomen Complete  03/18/2016  CLINICAL DATA:  Cirrhosis, fatty liver, possible ascites EXAM: ABDOMEN ULTRASOUND COMPLETE COMPARISON:  Abdominal ultrasound dated 02/15/2016 FINDINGS: Gallbladder: No gallstones, gallbladder wall thickening, or pericholecystic fluid. Negative sonographic Murphy's sign. Common bile duct: Diameter: 4 mm Liver: Hyperechoic hepatic parenchyma with coarse hepatic echotexture. No focal hepatic lesion is seen. IVC: Poorly visualized. Pancreas: Poorly visualized due to overlying bowel gas. Spleen: Size and appearance within normal limits. Right Kidney: Length: 11.6 cm.  No mass or hydronephrosis. Left Kidney: Length: 11.4 cm. 2.5 cm fluid density lesion in the left interpolar region is favored to reflect an extrarenal pelvis when correlating with prior CT. Abdominal aorta: No aneurysm visualized. Other findings: No abdominal ascites. IMPRESSION: Hyperechoic hepatic parenchyma with coarse hepatic echotexture, nonspecific but compatible with hepatocellular disease such as hepatic steatosis and/or cirrhosis. No focal hepatic lesion is seen. No abdominal ascites. Electronically Signed   By: Julian Hy M.D.   On: 03/18/2016 14:30  [2 weeks]   Assessment:  70 year old male with likely cirrhosis as evidenced on outpatient elastography, admitted  with worsening peripheral edema. Multiple other comorbidities. MELD-Na 19, but this is calculated with elevated INR at 1.93 in setting of anticoagulation (Pradaxa). Continues to diurese well. Mild hypokalemia addressed per  attending. Presentation multifactorial in setting of multiple comorbidities. Cardiology consult noted. US abdomen without ascites. Completed 5 rounds of Albumin/Lasix thus far. Has had some dizziness and soft blood pressures.  Mild cervical dysphagia: now resolved. Discussed taking pills one at a time, small bites, chewing well, sitting upright while eating.   Plan: 1. Continue Lasix and Aldactone. Monitor electrolytes.  2. Outpatient cirrhosis care, colonoscopy, consider EGD(colonoscopy due in Oct 2017 due to history of colon cancer).  Laureen Ochs. Bernarda Caffey [facility] [phone] 3/23/20172:35 PM     LOS: 5 days

## 2016-03-21 NOTE — Evaluation (Signed)
Physical Therapy Evaluation Patient Details Name: [name] MRN: [id number] DOB: 1946-11-11 Today's Date: 03/21/2016   History of Present Illness  : [name] is a 70 y.o. male with PMH of colorectal cancer status post excision and 7 cycles of neoadjuvant FOLFOX, seizure disorder, nonalcoholic liver disease, chronic diastolic CHF, and chronic atrial fibrillation on Pradaxa who presents to the ED with marked peripheral edema which is worsening despite a course of outpatient diuretics. He has reportedly gained 20 pounds in the last 2 weeks as his lower extremities have become increasingly taut with edema. Patient has had similar symptoms previously that was managed effectively with diuretic therapy. He was evaluated by his primary care physician in given a 3 day course of metolazone, but continued to gain another 6 pounds despite this. Patient has denied any chest pain, palpitations, or dyspnea throughout this illness. He denies any recent fevers, chills, sick contacts, or long distance travel. Of note, the patient is also known to have nonalcoholic liver disease with US/elastography performed 1 month ago and indicative of high risk for fibrosis. This is been associated with hypoalbuminemia.  He is admitted with volume overload.  Clinical Impression  Pt was seen for evaluation.  He reports no dizziness at this time.  He is very close to prior functional level and had been going to OP PT on a regular basis for work on strengthening and balance.  He states that he has lost quite a bit of the excess fluid for which he was admitted and he is feeling much better in general.  He has his walker here with him and his wife will plan to have him ambulate intermittently while here.    Follow Up Recommendations Outpatient PT (resume)    Equipment Recommendations  None recommended by PT    Recommendations for Other Services   PT    Precautions / Restrictions Precautions Precautions:  Fall Restrictions Weight Bearing Restrictions: No      Mobility  Bed Mobility Overal bed mobility: Modified Independent                Transfers Overall transfer level: Modified independent                  Ambulation/Gait Ambulation/Gait assistance: Modified independent (Device/Increase time) Ambulation Distance (Feet): 150 Feet Assistive device: Rolling walker (2 wheeled) Gait Pattern/deviations: Decreased weight shift to right;Decreased stance time - right   Gait velocity interpretation: >2.62 ft/sec, indicative of independent Tourist information centre manager [place] (Stroke Patients Only)       Balance                                             Pertinent Vitals/Pain Pain Assessment: No/denies pain    Home Living Family/patient expects to be discharged to:: Private residence Living Arrangements: Spouse/significant other Available Help at Discharge: Family;Available 24 hours/day Type of Home: House Home Access: Ramped entrance     Home Layout: One level Home Equipment: Walker - 2 wheels;Bedside commode;Cane - single point;Tub bench;Wheelchair - manual      Prior Function Level of Independence: Needs assistance   Gait / Transfers Assistance Needed: ambulates with a walker independently  ADL's / Homemaking Assistance Needed: some assist with bathing/dressing  Hand Dominance   Dominant Hand: Right    Extremity/Trunk Assessment               Lower Extremity Assessment: Overall WFL for tasks assessed;RLE deficits/detail RLE Deficits / Details: 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 during gait       Communication   Communication: No difficulties  Cognition Arousal/Alertness: Awake/alert Behavior During Therapy: WFL for tasks assessed/performed Overall Cognitive Status: Within Functional Limits for tasks assessed                       General Comments      Exercises General Exercises - Lower Extremity Ankle Circles/Pumps: AROM;Both;10 reps;Seated Quad Sets: AROM;Both;10 reps;Seated Gluteal Sets: AROM;Both;10 reps;Seated Long Arc Quad: AROM;Both;10 reps;Seated Hip ABduction/ADduction: AROM;Strengthening;Both;10 reps;Seated Hip Flexion/Marching: AROM;Both;20 reps;Seated      Assessment/Plan    PT Assessment All further PT needs can be met in the next venue of care  PT Diagnosis     PT Problem List Decreased strength;Decreased activity tolerance;Decreased mobility  PT Treatment Interventions     PT Goals (Current goals can be found in the Care Plan section) Acute Rehab PT Goals PT Goal Formulation: All assessment and education complete, DC therapy    Frequency     Barriers to discharge        Co-evaluation               End of Session Equipment Utilized During Treatment: Gait belt Activity Tolerance: Patient tolerated treatment well Patient left: in bed;with call bell/phone within reach;with bed alarm set Nurse Communication: Mobility status         Time: 2409-7353 PT Time Calculation (min) (ACUTE ONLY): 26 min   Charges:   PT Evaluation $PT Eval Low Complexity: 1 Procedure PT Treatments $Therapeutic Exercise: 8-22 mins   PT G CodesDemetrios Isaacs L  PT 03/21/2016, 12:07 PM

## 2016-03-21 NOTE — Telephone Encounter (Signed)
APPT MADE AND NURSE ON 300 AWARE AND WILL LET PATIENT KNOW  °

## 2016-03-21 NOTE — Progress Notes (Signed)
[name], male   DOB: 07-Dec-1946, 70 y.o.   MRN: [id number]      Subjective:   Some dizziness with upright position    Objective:   Temp:  [97.6 F (36.4 C)-98.5 F (36.9 C)] 98.5 F (36.9 C) (03/23 0504) Pulse Rate:  [55-64] 64 (03/23 0504) Resp:  [16] 16 (03/23 0504) BP: (97-111)/(61-73) 97/67 mmHg (03/23 0504) SpO2:  [97 %-98 %] 98 % (03/23 0504) Weight:  [114.397 kg (252 lb 3.2 oz)] 114.397 kg (252 lb 3.2 oz) (03/23 0504) Last BM Date: 03/17/16  Filed Weights   03/19/16 0444 03/20/16 0411 03/21/16 0504  Weight: 115.35 kg (254 lb 4.8 oz) 115.395 kg (254 lb 6.4 oz) 114.397 kg (252 lb 3.2 oz)    Intake/Output Summary (Last 24 hours) at 03/21/16 0816 Last data filed at 03/21/16 0504  Gross per 24 hour  Intake    720 ml  Output   1575 ml  Net   -855 ml    Exam:  Affect appropriate Chronically ill white male  HEENT: normal Neck supple with no adenopathy JVP normal no bruits no thyromegaly Lungs clear with no wheezing and good diaphragmatic motion Heart:  S1/S2 no murmur, no rub, gallop or click PMI normal Abdomen: benighn, BS positve, no tenderness, no AAA no bruit.  No HSM or HJR Distal pulses intact with no bruits Plus 2 bilteral LE  edema Neuro non-focal Skin warm and dry No muscular weakness   Lab Results:  Basic Metabolic Panel:  Recent Labs Lab 03/18/16 0651 03/19/16 0608 03/20/16 0808  NA 141 140 141  K 3.1* 3.1* 3.6  CL 104 100* 102  CO2 30 32 33*  GLUCOSE 97 97 99  BUN 34* 29* 25*  CREATININE 1.29* 1.20 1.17  CALCIUM 8.7* 8.8* 8.9    Liver Function Tests:  Recent Labs Lab 03/17/16 0602 03/18/16 0734 03/20/16 0808  AST 24 24 25   ALT 13* 13* 13*  ALKPHOS 81 77 78  BILITOT 1.3* 1.2 1.5*  PROT 6.1* 6.2* 7.0  ALBUMIN 2.9* 3.0* 3.8    CBC:  Recent Labs Lab 03/16/16 2341 03/17/16 0602  WBC 5.7 5.0  HGB 11.2* 10.4*  HCT 31.2* 29.6*  MCV 94.5 95.2  PLT 111* 108*    Cardiac Enzymes:  Recent  Labs Lab 03/16/16 0419 03/16/16 0954 03/16/16 1532  TROPONINI <0.03 <0.03 <0.03     Coagulation:  Recent Labs Lab 03/17/16 0602  INR 1.93*    ECG:   Medications:   Scheduled Medications: . dabigatran  150 mg Oral BID  . dicyclomine  10 mg Oral BID  . escitalopram  20 mg Oral Daily  . furosemide  60 mg Oral Daily  . levETIRAcetam  500 mg Oral BID  . pantoprazole  40 mg Oral QAC breakfast  . potassium chloride  20 mEq Oral BID  . sodium chloride flush  3 mL Intravenous Q12H  . spironolactone  12.5 mg Oral BID    Infusions:    PRN Medications: sodium chloride, acetaminophen, albuterol, meclizine, nitroGLYCERIN, ondansetron (ZOFRAN) IV, sodium chloride flush     Assessment/Plan    1. Acute on chronic diastolic HF - echo [id number] LVEF 55-60%, cannot describe diastolic function due to afib, biatrial enlargement suggests significant diastolic dysfunction -aldactone added  Change to PO lasix today   2. Afib - has not required AV nodal agents - he is on pradaxa for stroke prevention  3 Colon CA  4. NASH - per primary team  and GI contributes to anasarca aldactone added today      Jenkins Rouge

## 2016-03-22 LAB — BASIC METABOLIC PANEL
ANION GAP: 8 (ref 5–15)
BUN: 25 mg/dL — ABNORMAL HIGH (ref 6–20)
CALCIUM: 9.2 mg/dL (ref 8.9–10.3)
CHLORIDE: 105 mmol/L (ref 101–111)
CO2: 29 mmol/L (ref 22–32)
CREATININE: 1.31 mg/dL — AB (ref 0.61–1.24)
GFR calc Af Amer: >60 mL/min (ref >60)
GFR calc non Af Amer: 54 mL/min — ABNORMAL LOW (ref >60)
GLUCOSE: 96 mg/dL (ref 65–99)
Potassium: 3.6 mmol/L (ref 3.5–5.1)
Sodium: 142 mmol/L (ref 135–145)

## 2016-03-22 MED ORDER — DICYCLOMINE HCL 10 MG PO CAPS: 10.0000 mg | ORAL_CAPSULE | Freq: Two times a day (BID) | ORAL | Status: AC

## 2016-03-22 MED ORDER — POTASSIUM CHLORIDE CRYS ER 20 MEQ PO TBCR: 20.0000 meq | EXTENDED_RELEASE_TABLET | Freq: Two times a day (BID) | ORAL | Status: AC

## 2016-03-22 MED ORDER — BUMETANIDE 1 MG PO TABS
2.0000 mg | ORAL_TABLET | Freq: Two times a day (BID) | ORAL | Status: DC
  Filled 2016-03-22: qty 2

## 2016-03-22 MED ORDER — SPIRONOLACTONE 25 MG PO TABS: 12.5000 mg | ORAL_TABLET | Freq: Two times a day (BID) | ORAL | Status: AC

## 2016-03-22 MED ORDER — BUMETANIDE 1 MG PO TABS: 2.0000 mg | ORAL_TABLET | Freq: Two times a day (BID) | ORAL | Status: DC

## 2016-03-22 MED ORDER — MECLIZINE HCL 25 MG PO TABS: 25.0000 mg | ORAL_TABLET | Freq: Three times a day (TID) | ORAL | Status: AC | PRN

## 2016-03-22 NOTE — Progress Notes (Signed)
Patient discharged home with wife.  IV removed - WNL.  Reviewed DC instructions and medications.  Follow up appointments in place.  No questions at this time.  Verbalized understanding.  Stable to DC home, assisted off unnnit via WC by RN

## 2016-03-22 NOTE — Care Management Note (Signed)
Case Management Note  Patient Details  Name: [name] MRN: [id number] Date of Birth: Dec 22, 1946  Subjective/Objective:                  Pt is from home, admitted for anasarca. Pt is from home with wife and is ind with ADL's. Pt has walker to use with ambulation and receives PT at the [facility] in [place]. Pt plans to return home with self care and resume OP PT services. Pt has PCP and no difficulty obtaining medications.   Action/Plan: No CM needs identified. Pt discharging home today.   Expected Discharge Date:    03/22/2016              Expected Discharge Plan:  Home/Self Care  In-House Referral:  NA  Discharge planning Services  CM Consult  Post Acute Care Choice:  NA Choice offered to:  NA  DME Arranged:    DME Agency:     HH Arranged:    HH Agency:     Status of Service:  Completed, signed off  Medicare Important Message Given:  Yes Date Medicare IM Given:    Medicare IM give by:    Date Additional Medicare IM Given:    Additional Medicare Important Message give by:     If discussed at [place] of Stay Meetings, dates discussed:  02/22/2016  Additional Comments:  Sherald Barge, RN 03/22/2016, 8:38 AM

## 2016-03-22 NOTE — Progress Notes (Signed)
GI signing off for now. Outpatient evaluation planned. Please let us know if we can be of further assistance.

## 2016-03-22 NOTE — Discharge Summary (Signed)
Physician Discharge Summary  [name] MRN: [id number] DOB/AGE: 1946/03/04 70 y.o. Primary Care Physician:Naleah Kofoed L, MD Admit date: 03/15/2016 Discharge date: 03/22/2016    Discharge Diagnoses:   Principal Problem:   Volume overload Active Problems:   Morbid obesity ([place])   Colon carcinoma (HCC)   Malignant neoplasm of colon ([place])   Peripheral edema   Atrial fibrillation (HCC)   Spondylosis, cervical, with myelopathy   Diastolic CHF, acute on chronic (HCC)   NASH (nonalcoholic steatohepatitis)   Hypoalbuminemia   Normocytic anemia   Thrombocytopenia (HCC)   Acute on chronic diastolic congestive heart failure (HCC)   Cirrhosis ([place])   Anasarca     Medication List    STOP taking these medications        lidocaine-prilocaine cream  Commonly known as:  EMLA      TAKE these medications        albuterol 108 (90 Base) MCG/ACT inhaler  Commonly known as:  PROVENTIL HFA;VENTOLIN HFA  Inhale 1-2 puffs into the lungs every 6 (six) hours as needed for wheezing or shortness of breath. Reported on 02/20/2016     bumetanide 2 MG tablet  Commonly known as:  BUMEX  Take 2 mg by mouth 2 (two) times daily.     carbidopa-levodopa 25-100 MG tablet  Commonly known as:  SINEMET IR  Take 1 tablet by mouth daily. Take 1 tablet once daily x1 week, 1 tablet twice daily x1 week, 1 tablet three times daily.     dabigatran 150 MG Caps capsule  Commonly known as:  PRADAXA  Take 150 mg by mouth 2 (two) times daily.     dicyclomine 10 MG capsule  Commonly known as:  BENTYL  Take 1 capsule (10 mg total) by mouth 2 (two) times daily. 1 po 30 MINS BEFORE BREAKFAST AND MAY REPEAT BEFORE LUNCH.     escitalopram 20 MG tablet  Commonly known as:  LEXAPRO  Take 1 tablet (20 mg total) by mouth daily.     levETIRAcetam 500 MG tablet  Commonly known as:  KEPPRA  Take 1 tablet (500 mg total) by mouth 2 (two) times daily.     losartan 100 MG tablet  Commonly known  as:  COZAAR  Take 100 mg by mouth daily.     meclizine 25 MG tablet  Commonly known as:  ANTIVERT  Take 1 tablet (25 mg total) by mouth 3 (three) times daily as needed for dizziness.     nitroGLYCERIN 2 % ointment  Commonly known as:  NITROGLYN  Apply 0.5 inches topically as needed for chest pain. Reported on 02/20/2016     omeprazole 20 MG capsule  Commonly known as:  PRILOSEC  Take 1 capsule (20 mg total) by mouth daily.     potassium chloride SA 20 MEQ tablet  Commonly known as:  K-DUR,KLOR-CON  Take 1 tablet (20 mEq total) by mouth 2 (two) times daily.     psyllium 58.6 % packet  Commonly known as:  METAMUCIL  Take 1 packet by mouth daily.     spironolactone 25 MG tablet  Commonly known as:  ALDACTONE  Take 0.5 tablets (12.5 mg total) by mouth 2 (two) times daily.        Discharged Condition:Improved    Consults: Gastroenterology/cardiology  Significant Diagnostic Studies: Dg Chest 2 View  03/16/2016  CLINICAL DATA:  Acute onset of bilateral lower extremity edema. Initial encounter. EXAM: CHEST  2 VIEW COMPARISON:  Chest radiograph performed 12/14/2015  FINDINGS: The lungs are mildly hypoexpanded. Mild right basilar atelectasis is noted. No pleural effusion or pneumothorax is seen. The cardiomediastinal silhouette is borderline normal in size. No acute osseous abnormalities are identified. A left-sided chest port is noted ending about the proximal SVC. IMPRESSION: Lungs mildly hypoexpanded, with mild right basilar atelectasis. Electronically Signed   By: Garald Balding M.D.   On: 03/16/2016 00:05   US Abdomen Complete  03/18/2016  CLINICAL DATA:  Cirrhosis, fatty liver, possible ascites EXAM: ABDOMEN ULTRASOUND COMPLETE COMPARISON:  Abdominal ultrasound dated 02/15/2016 FINDINGS: Gallbladder: No gallstones, gallbladder wall thickening, or pericholecystic fluid. Negative sonographic Murphy's sign. Common bile duct: Diameter: 4 mm Liver: Hyperechoic hepatic parenchyma with  coarse hepatic echotexture. No focal hepatic lesion is seen. IVC: Poorly visualized. Pancreas: Poorly visualized due to overlying bowel gas. Spleen: Size and appearance within normal limits. Right Kidney: Length: 11.6 cm.  No mass or hydronephrosis. Left Kidney: Length: 11.4 cm. 2.5 cm fluid density lesion in the left interpolar region is favored to reflect an extrarenal pelvis when correlating with prior CT. Abdominal aorta: No aneurysm visualized. Other findings: No abdominal ascites. IMPRESSION: Hyperechoic hepatic parenchyma with coarse hepatic echotexture, nonspecific but compatible with hepatocellular disease such as hepatic steatosis and/or cirrhosis. No focal hepatic lesion is seen. No abdominal ascites. Electronically Signed   By: Julian Hy M.D.   On: 03/18/2016 14:30    Lab Results: Basic Metabolic Panel:  Recent Labs  03/21/16 0843 03/22/16 0619  NA 140 142  K 3.4* 3.6  CL 102 105  CO2 32 29  GLUCOSE 132* 96  BUN 24* 25*  CREATININE 1.25* 1.31*  CALCIUM 8.8* 9.2   Liver Function Tests:  Recent Labs  03/20/16 0808  AST 25  ALT 13*  ALKPHOS 78  BILITOT 1.5*  PROT 7.0  ALBUMIN 3.8     CBC: No results for input(s): WBC, NEUTROABS, HGB, HCT, MCV, PLT in the last 72 hours.  No results found for this or any previous visit (from the past 240 hour(s)).   Hospital Course: This is a 70 year old who has multiple medical problems as listed. He's been having increasing problems with fluid retention and despite diuresis at home continued to have fluid retention. He came to the emergency department where he was noted to have anasarca. His albumin level was low and he was treated with albumin and Lasix intravenously with good results. Aldactone was added. He had some dizziness but that has now resulted. He is at a weight of 248 pounds which is the lowest weight he's had in the last 6 months. He feels much better. He is ready for discharge.  Discharge Exam: Blood pressure  98/61, pulse 58, temperature 98 F (36.7 C), temperature source Oral, resp. rate 18, height 5\' 7"  (1.702 m), weight 112.674 kg (248 lb 6.4 oz), SpO2 95 %. He is morbidly obese. He does not have any edema now. He is in atrial fibrillation.  Disposition: Home he will have basic metabolic profile in 3 days. He will continue medications as listed. He has had extensive dietary counseling. We discussed home health services but he does not want that      Discharge Instructions    Discharge patient    Complete by:  As directed            Follow-up Information    Follow up with Barney Drain, MD On 05/21/2016.   Specialty:  Gastroenterology   Why:  at 1:30 with Alonna Minium information:  [place] [address] [phone]       Signed: [name]   03/22/2016, 9:13 AM

## 2016-03-22 NOTE — Progress Notes (Signed)
He feels much better. His dizziness is gone. He is down to 248 pounds which is his lowest weight in the last 6 months or so. His edema is much better. He is ready for discharge. Please see discharge summary for details

## 2016-03-22 NOTE — Progress Notes (Signed)
Subjective:    No SOB  Objective:   Temp:  [97.5 F (36.4 C)-98.1 F (36.7 C)] 98 F (36.7 C) (03/24 0542) Pulse Rate:  [57-58] 58 (03/23 2101) Resp:  [18-20] 18 (03/23 2101) BP: (98)/(61) 98/61 mmHg (03/23 2101) SpO2:  [91 %-100 %] 95 % (03/24 0542) Weight:  [248 lb 6.4 oz (112.674 kg)] 248 lb 6.4 oz (112.674 kg) (03/24 0542) Last BM Date: 03/18/16  Filed Weights   03/20/16 0411 03/21/16 0504 03/22/16 0542  Weight: 254 lb 6.4 oz (115.395 kg) 252 lb 3.2 oz (114.397 kg) 248 lb 6.4 oz (112.674 kg)    Intake/Output Summary (Last 24 hours) at 03/22/16 0842 Last data filed at 03/22/16 0542  Gross per 24 hour  Intake    240 ml  Output    800 ml  Net   -560 ml     Exam:  General: NAD  HEENT: sclera clear, throat clear  Resp: CTAB  Cardiac: irreg, no m/r/g, no jvd  GI: abdomen soft, NT, ND  MSK: no LE edema  Neuro: no focal deficits  Psych: appropriate affect  Lab Results:  Basic Metabolic Panel:  Recent Labs Lab 03/20/16 0808 03/21/16 0843 03/22/16 0619  NA 141 140 142  K 3.6 3.4* 3.6  CL 102 102 105  CO2 33* 32 29  GLUCOSE 99 132* 96  BUN 25* 24* 25*  CREATININE 1.17 1.25* 1.31*  CALCIUM 8.9 8.8* 9.2    Liver Function Tests:  Recent Labs Lab 03/17/16 0602 03/18/16 0734 03/20/16 0808  AST 24 24 25   ALT 13* 13* 13*  ALKPHOS 81 77 78  BILITOT 1.3* 1.2 1.5*  PROT 6.1* 6.2* 7.0  ALBUMIN 2.9* 3.0* 3.8    CBC:  Recent Labs Lab 03/16/16 2341 03/17/16 0602  WBC 5.7 5.0  HGB 11.2* 10.4*  HCT 31.2* 29.6*  MCV 94.5 95.2  PLT 111* 108*    Cardiac Enzymes:  Recent Labs Lab 03/16/16 0419 03/16/16 0954 03/16/16 1532  TROPONINI <0.03 <0.03 <0.03    BNP: No results for input(s): PROBNP in the last 8760 hours.  Coagulation:  Recent Labs Lab 03/17/16 0602  INR 1.93*    ECG:   Medications:   Scheduled Medications: . dabigatran  150 mg Oral BID  . dicyclomine  10 mg Oral BID  . escitalopram  20 mg Oral Daily  .  furosemide  60 mg Oral Daily  . levETIRAcetam  500 mg Oral BID  . pantoprazole  40 mg Oral QAC breakfast  . potassium chloride  20 mEq Oral BID  . sodium chloride flush  3 mL Intravenous Q12H  . spironolactone  12.5 mg Oral BID     Infusions:     PRN Medications:  sodium chloride, acetaminophen, albuterol, meclizine, nitroGLYCERIN, ondansetron (ZOFRAN) IV, sodium chloride flush     Assessment/Plan    1. Acute on chronic diastolic HF - echo [id number] LVEF 55-60%, cannot describe diastolic function due to afib, biatrial enlargement suggests significant diastolic dysfunction - weight in clinic 01/2016 256 lbs, admit weight reportedly 275 lbs. Weight today 248 lbs.  - negative 560 mL yesterday though I'Os are incomplete from yesterday, negative 5.6 liters since admit. Reported net diuresis does not correspond to reported weight loss.  - he is on lasix oral 60mg  daily and aldactone 12.5mg  daily. Mild uptrend in Cr and BUN.  - he had been on bumex at home, would change back to his 2mg  bid starting tomorrow given his orthostatic dizziness,  he was orthostatic by vitals as well. Would also treat with metolazone 2.5mg  once weekly with instructions ok to take additional metolazone for weight gain of 3 pounds.   2. Afib - has not required AV nodal agents - he is on pradaxa for stroke prevention  3 Colon CA  4. NASH - per primary team and GI     Ok for discharge today from cardiac standpoint, we will arrange f/u in 1 week in our clinic with labs.     Carlyle Dolly, M.D.,

## 2016-03-22 NOTE — Care Management Important Message (Signed)
Important Message  Patient Details  Name: [name] MRN: [id number] Date of Birth: 12-Feb-1946   Medicare Important Message Given:  Yes    Sherald Barge, RN 03/22/2016, 8:38 AM

## 2016-03-26 ENCOUNTER — Encounter (HOSPITAL_COMMUNITY): Payer: Medicare Other

## 2016-03-27 ENCOUNTER — Encounter (HOSPITAL_COMMUNITY): Payer: Self-pay

## 2016-03-27 ENCOUNTER — Ambulatory Visit (HOSPITAL_COMMUNITY): Payer: Medicare Other

## 2016-03-27 DIAGNOSIS — R6889 Other general symptoms and signs: Secondary | ICD-10-CM | POA: Diagnosis present

## 2016-03-27 DIAGNOSIS — R29898 Other symptoms and signs involving the musculoskeletal system: Secondary | ICD-10-CM

## 2016-03-27 DIAGNOSIS — R269 Unspecified abnormalities of gait and mobility: Secondary | ICD-10-CM | POA: Diagnosis present

## 2016-03-27 DIAGNOSIS — R262 Difficulty in walking, not elsewhere classified: Secondary | ICD-10-CM | POA: Diagnosis present

## 2016-03-27 DIAGNOSIS — R296 Repeated falls: Secondary | ICD-10-CM

## 2016-03-27 NOTE — Therapy (Signed)
[facility] [address] [place], [place], [address] Phone: [phone]   Fax:  [phone]  Physical Therapy Treatment ( 30-day Progress Note)  Patient Details  Name: [name] MRN: [id number] Date of Birth: 1946-09-30 Referring Provider: Dr. [name]   Encounter Date: March 30, 2016      PT End of Session - 03-30-16 0856    Visit Number 21   Number of Visits 28   Date for PT Re-Evaluation 04/12/16   Authorization Type Medicare   Authorization Time Period 02/27/2016 to 12-Apr-2016; Reassessment and G-codes updated on Mar 30, 2016 visit 21   Authorization - Visit Number 21   Authorization - Number of Visits 26   PT Start Time 0848   PT Stop Time 0930   PT Time Calculation (min) 42 min   Equipment Utilized During Treatment Gait belt   Activity Tolerance Patient tolerated treatment well   Behavior During Therapy [facility] for tasks assessed/performed      Past Medical History  Diagnosis Date  . Varicose veins   . New onset atrial fibrillation ([place]) 01/02/2015  . Morbid obesity ([place]) 01/02/2015  . Hypertension   . Sleep apnea     been tested but has not received the CPAP yet  . GERD (gastroesophageal reflux disease)   . History of gout   . Cancer ([place]) 02/2015    colon  . Family history of colon cancer   . Family history of breast cancer in mother   . Colon cancer [facility])     March 2016    Past Surgical History  Procedure Laterality Date  . Vein ligation and stripping      left leg  . Colonoscopy N/A March 31, 2015    SLF: 1. Abdominal pain, diarrhea, rectal bleeding due to obstructing colon mass  . Esophagogastroduodenoscopy N/A 31-Mar-2015    SLF: 1. dysphagia 2. Mild non-erosive gastritis.   . Esophageal dilation N/A 31-Mar-2015    Procedure: ESOPHAGEAL DILATION;  Surgeon: Peter Binder, MD;  Location: [facility] ENDO SUITE;  Service: Endoscopy;  Laterality: N/A;  . Partial colectomy N/A 04/03/2015    Procedure: PARTIAL COLECTOMY;  Surgeon:  [name], MD;  Location: [facility] ORS;  Service: General;  Laterality: N/A;  . Portacath placement N/A 05/10/2015    Procedure: INSERTION PORT-A-CATH;  Surgeon: [name], MD;  Location: [facility] ORS;  Service: General;  Laterality: N/A;  left subclavian  . Skin cancer destruction Left 08/16/15    left side of nose and left back  . Colonoscopy N/A 10/03/2015    SLF: normal anastomosis, fair prep (polyps less than 1cm could be missed), anal fissures and verrucous anal canal lesion with benign biopsy. Next TCS in 09/2016.  . Colonoscopy N/A 10/02/2015    SLF: normal anastomosis, poor bowel prep, three anal fissures    There were no vitals filed for this visit.  Visit Diagnosis:  Abnormality of gait  Leg weakness, bilateral  Difficulty walking  Decreased functional activity tolerance  Falls frequently      Subjective Assessment - 30-Mar-2016 0851    Subjective [name] reported that he was recently hospitalized for "swelling of my legs" and was in the hospital for ~1 week. Pt reported that he had therapy services while he was hospitalized. Pt noted that he has had some changes to his meds, but does not remember the name of them. He will plan to bring an updated med list at his next PT visit. Pt c/o significant R knee  pain upon arrival that was rated an 8/10 on a VAS. He reports that he has felt weak since his hospitalization. He noted that he has not been able to complete his HEP since being discharged from the hospital. Pt denied falls since his last PT visit.    Patient is accompained by: Family member   Pertinent History Patient had cancer surgery in april 2016 to removed a18inches of colon, mD beleives they removed all of colon, Patient began Chemo 05/17/15. patient had surgery 20 years ago to "strip veins" and has sicne had swelling in both legs. patint [place] shooting pool, patient states limited physical activity due to bad knee and limited energy levels noting "I can t walk across the  road."  "I feel worn out to where i can sit up for a while then have to sit back down and rest.  little pain with sit to stance.    Limitations Walking;Standing   How long can you sit comfortably? 03/27/2016=no limitations   How long can you stand comfortably? 03/27/2016= 2 minutes   How long can you walk comfortably? 03/27/2016= minutes   Patient Stated Goals to improve overall balance, improve B hip strength, and reduce R knee pain    Currently in Pain? Yes   Pain Score 8   R knee pain has ranged between a 4-8/10 on a VAS since last week   Pain Location Knee   Pain Orientation Right   Pain Descriptors / Indicators Aching   Pain Type Chronic pain   Pain Onset More than a month ago   Pain Frequency Constant   Aggravating Factors  walking and prolonged standing    Pain Relieving Factors sitting and B LE elevation    Effect of Pain on Daily Activities limits ability to ambulate long distances             [facility] PT Assessment - 03/27/16 0001    Assessment   Medical Diagnosis balance disorder   Referring Provider Dr. [name]    Onset Date/Surgical Date 10/19/15   Hand Dominance Right   Next MD Visit 05/07/2016  Cardiologist scheduled for 03/28/2016    Precautions   Precautions Fall   Restrictions   Weight Bearing Restrictions No   Balance Screen   How many times? No additional falls reported since last PT reassessment   [place] residence   Observation/Other Assessments   Focus on Therapeutic Outcomes (FOTO)  48 % (52% limited)   Functional Tests   Functional tests Single leg stance;Sit to Stand   Single Leg Stance   Comments SLS (L/R)= 1 sec/ 0 sec   Sit to Stand   Comments 5 sit to stand= 17 sec    Posture/Postural Control   Posture/Postural Control Postural limitations   Postural Limitations Rounded Shoulders;Forward head;Increased thoracic kyphosis;Flexed trunk   Strength   Right Hip Flexion 4/5   Right Hip Extension 3/5    Right Hip ABduction 4-/5   Left Hip Flexion 4/5   Left Hip Extension 3/5   Left Hip ABduction 4-/5   Right Knee Flexion 4+/5   Right Knee Extension 5/5   Left Knee Flexion 4+/5   Left Knee Extension 5/5   Right Ankle Dorsiflexion 5/5   Left Ankle Dorsiflexion 5/5   Transfers   Transfers Sit to Stand;Stand to Sit   Sit to Stand 6: Modified independent (Device/Increase time)   Stand to Sit 6: Modified independent (Device/Increase time)   Ambulation/Gait  Ambulation/Gait Yes   Ambulation/Gait Assistance 5: Supervision   Ambulation Distance (Feet) 226 Feet   Assistive device Rolling walker   Gait Pattern Trendelenburg   Ambulation Surface Level   Gait Comments minor DOE assessed and reported    Berg Balance Test   Sit to Stand Able to stand without using hands and stabilize independently   Standing Unsupported Able to stand 2 minutes with supervision   Sitting with Back Unsupported but Feet Supported on Floor or Stool Able to sit safely and securely 2 minutes   Stand to Sit Sits safely with minimal use of hands   Transfers Able to transfer safely, definite need of hands   Standing Unsupported with Eyes Closed Able to stand 10 seconds safely   Standing Ubsupported with Feet Together Able to place feet together independently and stand for 1 minute with supervision   From Standing, Reach Forward with Outstretched Arm Can reach forward >12 cm safely (5")   From Standing Position, Pick up Object from Floor Able to pick up shoe, needs supervision   From Standing Position, Turn to Look Behind Over each Shoulder Looks behind one side only/other side shows less weight shift   Turn 360 Degrees Needs close supervision or verbal cueing   Standing Unsupported, Alternately Place Feet on Step/Stool Able to complete 4 steps without aid or supervision   Standing Unsupported, One Foot in Front Able to take small step independently and hold 30 seconds   Standing on One Leg Unable to try or needs assist  to prevent fall   Total Score 39   Timed Up and Go Test   Normal TUG (seconds) 24  FWW                     [facility] Adult PT Treatment/Exercise - 03/27/16 0001    Knee/Hip Exercises: Standing   Gait Training Indoor gait training with use of FWW x 226 x2 and 100 x 1 with focus on heel to toe gait pattern and upright posture  with close supervision    Knee/Hip Exercises: Seated   Marching Limitations 2 sets of 15 reps with blue thera-band   Hamstring Curl Strengthening;2 sets;15 reps;Both   Hamstring Limitations blue thera-band    Abduction/Adduction  Strengthening;Both;3 sets;10 reps;Other (comment)  completed abduction and adduction    Abd/Adduction Limitations blue thera-band    Sit to Sand 2 sets;10 reps;without UE support             Balance Exercises - 03/27/16 1250    Balance Exercises: Standing   Standing Eyes Opened Other (comment);Solid surface;Narrow base of support (BOS);30 secs;4 reps   Standing Eyes Closed Solid surface;Narrow base of support (BOS);30 secs;4 reps   Tandem Stance Eyes open;2 reps;30 secs   SLS Eyes open;3 reps;Upper extremity support 2;10 secs           PT Education - 03/27/16 0856    Education provided Yes   Education Details Current HEP and R knee pain management strategies    Person(s) Educated Patient   Methods Explanation;Demonstration   Comprehension Verbalized understanding;Returned demonstration;Verbal cues required          PT Short Term Goals - 03/27/16 0859    PT SHORT TERM GOAL #1   Title Pt to be independent  in Hep in order to obtain goals in a timely manner.    Time 3   Period Weeks   Status Achieved   PT SHORT TERM GOAL #2   Title  Patient will be able to ambulate 94minutes without resting in order to progress to better health habits.    Baseline 2 minuntes with rest break required due to general fatigue an R knee pain    Time 3   Period Weeks   Status On-going   PT SHORT TERM GOAL #3   Title Pt to be  able to stand for 10 minutes to be able to complete self grooming activity without fear of falling    Baseline unknown- not recently attempted due to recent hospitalization    Time 3   Period Weeks           PT Long Term Goals - 03/27/16 0900    PT LONG TERM GOAL #1   Title Pt to be Independent  in advance HEP in order to decrease frequency of falling    Time 6   Period Weeks   Status On-going   PT LONG TERM GOAL #2   Title Pt to have not fallen in the past two weeks for reduced risk of injury    Time 6   Period Weeks   Status Achieved   PT LONG TERM GOAL #3   Title Pt to be able to walk for 15 minutes without stopping for better health habits   Baseline 2 minuntes with rest break required due to general fatigue and R knee pain    Time 6   Period Weeks   Status On-going   PT LONG TERM GOAL #4   Title Pt to be able to stand for 15 minutes to be safe in shower    Baseline unknown- not recently attempted due to recent hospitalization    Time 6   Period Weeks   Status On-going   PT LONG TERM GOAL #5   Title Pt will demo improved B hip strength to 4/5 MMT in order to be able to walk in his home.    Baseline B knee strength= WFL; refer to assessment for hip MMT    Time 6   Period Weeks   Status On-going   PT LONG TERM GOAL #6   Title Patient will improve TUG time to <14 sec in order to reduce the risk for falls with community ambulation.    Baseline 24 sec with FWW ( 03/27/2016)    Time 3   Period Weeks   Status On-going   PT LONG TERM GOAL #7   Title Patient will improve Berg balance score to >45/56 in order to reduce the risk for falls with household ambulation and with ADLs.    Baseline Berg= 39/56 ( 03/27/2016)    Time 6   Period Weeks   Status On-going               Plan - 03/27/16 0857    Clinical Impression Statement Mr. Burkes is a 70 yo male who has been seen for a total of 21 skilled PT visits with initial complaints of unsteady gait and balance. The  pt was making steady progress towards stated goals, but has had a recent regression in his progress secondary to recent hospitalization. The pt returns to outpatient PT as of today with minor regressions assessed with gait tolerance/speed and decreased knee and hip strength. TUG time regressed from 18 sec with FWW to 24 sec. No significant changes assessed with 5 sit to stand test or Berg Balance score since last PT reassessment. Pt continues to present with balance deficits, difficulty with walking, decreased endurance, R knee  pain, and B hip weakness with main deficits assessed with hip extensors and abductors. Berg balance test score continues to indicate high risk for falls (39/56). Goals continue to be appropriate for desired outcomes. Today's PT tx was focused on resisted B hip/knee strengthening in a seated position, gait training with FWW, and standing balance training. Pt was limited by his R knee pain and decreased endurance with rest breaks required during gait training. Good tolerance assessed with resisted LE ther ex. Reviewed current HEP with good understanding verbalized. Therapist instructed the pt to re-initiate his HEP as tolerated. The pt would continue to benefit from skilled PT with current POC in order to further address current strength, gait, and balance deficits. Pt is in agreement with continued skilled PT. Referring MD is in agreement with continued skilled PT with request to continue with outpatient PT.    Pt will benefit from skilled therapeutic intervention in order to improve on the following deficits Abnormal gait;Decreased activity tolerance;Decreased balance;Decreased endurance;Decreased strength;Difficulty walking;Pain;Postural dysfunction   Rehab Potential Good   PT Frequency 2x / week   PT Duration 6 weeks   PT Treatment/Interventions Gait training;Functional mobility training;Therapeutic activities;Therapeutic exercise;Balance training;Patient/family education;Neuromuscular  re-education;Moist Heat;Cryotherapy;Passive range of motion;Manual techniques;DME Instruction;ADLs/Self Care Home Management   PT Next Visit Plan Next visit to focus on seated B LE strengthening, gait training, and standing balance training    PT Home Exercise Plan Reviewed HEP    Consulted and Agree with Plan of Care Patient;Family member/caregiver   Family Member Consulted Spouse           G-Codes - April 05, 2016 1406    Functional Assessment Tool Used Foto and clinical findings including Berg balance score, MMT, pain, and functional mobility    Functional Limitation Mobility: Walking and moving around   Mobility: Walking and Moving Around Current Status [phone]) At least 40 percent but less than 60 percent impaired, limited or restricted   Mobility: Walking and Moving Around Goal Status [phone]) At least 40 percent but less than 60 percent impaired, limited or restricted      Problem List Patient Active Problem List   Diagnosis Date Noted  . Anasarca 03/21/2016  . Cirrhosis ([place])   . CHF (congestive heart failure) ([place]) 03/16/2016  . Volume overload 03/16/2016  . Diastolic CHF, acute on chronic (HCC) 03/16/2016  . NASH (nonalcoholic steatohepatitis) 03/16/2016  . Hypoalbuminemia 03/16/2016  . Normocytic anemia 03/16/2016  . Thrombocytopenia ([place]) 03/16/2016  . Acute on chronic diastolic congestive heart failure ([place])   . Fatty liver 02/06/2016  . 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 Altered mental status   . Abnormal LFTs   . Fecal incontinence   . Peripheral edema 10/15/2015  . Acute diastolic CHF (congestive heart failure) ([place]) 10/15/2015  . Generalized weakness 10/15/2015  . DVT (deep venous thrombosis) ([place]) 10/15/2015  . Atrial fibrillation ([place]) 10/15/2015  . Change in bowel habits   . Malignant neoplasm of colon ([place])   . Colon cancer ([place])   . Constipation 09/20/2015  . Elevated liver enzymes 09/20/2015  . Cellulitis 08/04/2015  . Family history of  colon cancer   . Family history of breast cancer in mother   . Colon carcinoma ([place]) 04/03/2015  . Occult blood in stools   . Dysphagia, pharyngoesophageal phase   . Melena 03/15/2015  . Heme positive stool 03/15/2015  . Esophageal dysphagia 03/15/2015  . Diarrhea 03/15/2015  . New onset atrial fibrillation ([place]) 01/02/2015  . Morbid obesity ([place]) 01/02/2015  .  Varicose veins of lower extremities with other complications [id number]    Garen Lah, PT, DPT   03/27/2016, 2:11 PM  [facility] [address] [place], [place], [address] Phone: [phone]   Fax:  [phone]  Name: [name] MRN: [id number] Date of Birth: 29-Jul-1946

## 2016-03-28 ENCOUNTER — Ambulatory Visit (INDEPENDENT_AMBULATORY_CARE_PROVIDER_SITE_OTHER): Payer: Medicare Other | Admitting: Cardiology

## 2016-03-28 ENCOUNTER — Encounter: Payer: Self-pay | Admitting: Cardiology

## 2016-03-28 VITALS — BP 92/58 | HR 63 | Ht 67.0 in | Wt 252.0 lb

## 2016-03-28 DIAGNOSIS — K7581 Nonalcoholic steatohepatitis (NASH): Secondary | ICD-10-CM

## 2016-03-28 DIAGNOSIS — I482 Chronic atrial fibrillation, unspecified: Secondary | ICD-10-CM

## 2016-03-28 DIAGNOSIS — C189 Malignant neoplasm of colon, unspecified: Secondary | ICD-10-CM | POA: Diagnosis not present

## 2016-03-28 DIAGNOSIS — I5033 Acute on chronic diastolic (congestive) heart failure: Secondary | ICD-10-CM | POA: Diagnosis not present

## 2016-03-28 DIAGNOSIS — Z7901 Long term (current) use of anticoagulants: Secondary | ICD-10-CM | POA: Insufficient documentation

## 2016-03-28 MED ORDER — LOSARTAN POTASSIUM 50 MG PO TABS: 50.0000 mg | ORAL_TABLET | Freq: Every day | ORAL | Status: DC

## 2016-03-28 NOTE — Patient Instructions (Addendum)
Your physician recommends that you schedule a follow-up appointment in: 2 Months with Dr. Harl Bowie  Your physician has recommended you make the following change in your medication:   Decrease Losartan ( Cozaar) to 50 mg Daily   Please call if Systolic BP is below 90   If you need a refill on your cardiac medications before your next appointment, please call your pharmacy.  Thank you for choosing [facility]!

## 2016-03-28 NOTE — Assessment & Plan Note (Signed)
Just discharged 03/22/16 after 27 lb diuresis- Goal wgt 250. Doing well since discharge.

## 2016-03-28 NOTE — Assessment & Plan Note (Signed)
Rate controlled, appears to be chronic

## 2016-03-28 NOTE — Assessment & Plan Note (Signed)
Followed by oncology 

## 2016-03-28 NOTE — Assessment & Plan Note (Addendum)
BMI 39 

## 2016-03-28 NOTE — Progress Notes (Signed)
03/28/2016 TUDOR LEGNER   11/08/1946  [id number]  Primary Physician HAWKINS,EDWARD L, MD Primary Cardiologist: Dr Harl Bowie  HPI:  70 y.o.male with history of colon CA, morbid obesity, NASH,  afib (CHADs VASC=2) on pradaxa, and chronic diastolic HF admitted with weight gain, edema, and SOB. Reported 20 lbs weight gain over the last 2 weeks, did not respond to oral diuretics at home. His admission wgt was 275lbs. He was admitted and diuresed. He lost 27 lbs. He is in the office today for follow up. Labs done a few days ago looked good. His wgt today is 250lbs. His B/P was low in the hospital after he was diuresed and has remained low after discharge. He has not had syncope.    Current Outpatient Prescriptions  Medication Sig Dispense Refill  . albuterol (PROVENTIL HFA;VENTOLIN HFA) 108 (90 Base) MCG/ACT inhaler Inhale 1-2 puffs into the lungs every 6 (six) hours as needed for wheezing or shortness of breath. Reported on 02/20/2016    . bumetanide (BUMEX) 2 MG tablet Take 2 mg by mouth 2 (two) times daily.     . carbidopa-levodopa (SINEMET IR) 25-100 MG tablet Take 1 tablet by mouth daily. Take 1 tablet once daily x1 week, 1 tablet twice daily x1 week, 1 tablet three times daily.    . dabigatran (PRADAXA) 150 MG CAPS capsule Take 150 mg by mouth 2 (two) times daily.    [name] dicyclomine (BENTYL) 10 MG capsule Take 1 capsule (10 mg total) by mouth 2 (two) times daily. 1 po 30 MINS BEFORE BREAKFAST AND MAY REPEAT BEFORE LUNCH. 60 capsule 11  . escitalopram (LEXAPRO) 20 MG tablet Take 1 tablet (20 mg total) by mouth daily. 30 tablet 5  . levETIRAcetam (KEPPRA) 500 MG tablet Take 1 tablet (500 mg total) by mouth 2 (two) times daily. 60 tablet 12  . meclizine (ANTIVERT) 25 MG tablet Take 1 tablet (25 mg total) by mouth 3 (three) times daily as needed for dizziness. 30 tablet 0  . nitroGLYCERIN (NITROGLYN) 2 % ointment Apply 0.5 inches topically as needed for chest pain. Reported on 02/20/2016    .  omeprazole (PRILOSEC) 20 MG capsule Take 1 capsule (20 mg total) by mouth daily. 30 capsule 3  . potassium chloride SA (K-DUR,KLOR-CON) 20 MEQ tablet Take 1 tablet (20 mEq total) by mouth 2 (two) times daily. 90 tablet 3  . psyllium (METAMUCIL) 58.6 % packet Take 1 packet by mouth daily.    [name] spironolactone (ALDACTONE) 25 MG tablet Take 0.5 tablets (12.5 mg total) by mouth 2 (two) times daily. 60 tablet 5  . losartan (COZAAR) 50 MG tablet Take 1 tablet (50 mg total) by mouth daily. 90 tablet 3   No current facility-administered medications for this visit.    No Known Allergies  Social History   Social History  . Marital Status: Married    Spouse Name: [name]  . Number of Children: 2  . Years of Education: N/A   Occupational History  . Not on file.   Social History Main Topics  . Smoking status: Never Smoker   . Smokeless tobacco: Never Used  . Alcohol Use: No  . Drug Use: No  . Sexual Activity: No   Other Topics Concern  . Not on file   Social History Narrative     Review of Systems: General: negative for chills, fever, night sweats or weight changes.  Cardiovascular: negative for chest pain, dyspnea on exertion, edema, orthopnea, palpitations, paroxysmal nocturnal dyspnea  or shortness of breath Dermatological: negative for rash Respiratory: negative for cough or wheezing Urologic: negative for hematuria Abdominal: negative for nausea, vomiting, diarrhea, bright red blood per rectum, melena, or hematemesis Neurologic: negative for visual changes, syncope, or dizziness All other systems reviewed and are otherwise negative except as noted above.    Blood pressure 92/58, pulse 63, height 5\' 7"  (1.702 m), weight 252 lb (114.306 kg), SpO2 96 %.  General appearance: alert, cooperative, no distress and morbidly obese Lungs: clear to auscultation bilaterally Heart: irregularly irregular rhythm Extremities: chronic venous edema with skin changes Neurologic: Grossly  normal   ASSESSMENT AND PLAN:   Diastolic CHF, acute on chronic ([place]) Just discharged 03/22/16 after 27 lb diuresis- Goal wgt 250. Doing well since discharge.   Atrial fibrillation ([place]) Rate controlled, appears to be chronic  NASH (nonalcoholic steatohepatitis) Followed by Dr Oneida Alar  Colon carcinoma [facility]) Followed by oncology  Chronic anticoagulation Pradaxa  Morbid obesity ([place]) BMI-39   PLAN  I decreased his Cozaar to 50 mg daily. If his B/P remains less than 95 systolic I would stop this altogether. He should f/u with Dr Harl Bowie in 2 months.   Kerin Ransom K PA-C 03/28/2016 2:11 PM

## 2016-03-28 NOTE — Assessment & Plan Note (Signed)
Pradaxa 

## 2016-03-28 NOTE — Assessment & Plan Note (Addendum)
Followed by Dr. Fields. 

## 2016-04-02 ENCOUNTER — Ambulatory Visit (HOSPITAL_COMMUNITY): Payer: Medicare Other | Attending: Pulmonary Disease

## 2016-04-02 DIAGNOSIS — R6889 Other general symptoms and signs: Secondary | ICD-10-CM | POA: Diagnosis present

## 2016-04-02 DIAGNOSIS — R29898 Other symptoms and signs involving the musculoskeletal system: Secondary | ICD-10-CM

## 2016-04-02 DIAGNOSIS — M6281 Muscle weakness (generalized): Secondary | ICD-10-CM | POA: Diagnosis present

## 2016-04-02 DIAGNOSIS — R269 Unspecified abnormalities of gait and mobility: Secondary | ICD-10-CM | POA: Diagnosis not present

## 2016-04-02 DIAGNOSIS — R296 Repeated falls: Secondary | ICD-10-CM

## 2016-04-02 DIAGNOSIS — R279 Unspecified lack of coordination: Secondary | ICD-10-CM | POA: Diagnosis present

## 2016-04-02 DIAGNOSIS — R262 Difficulty in walking, not elsewhere classified: Secondary | ICD-10-CM

## 2016-04-02 NOTE — Therapy (Signed)
[place], [place], [address] Phone: [phone]   Fax:  [phone]  Physical Therapy Treatment  Patient Details  Name: [name] MRN: [id number] Date of Birth: [birth year] Referring Provider: Dr. [name]   Encounter Date: 04/02/2016      PT End of Session - 04/02/16 1732    Visit Number 22   Number of Visits 28   Date for PT Re-Evaluation 04/09/16   Authorization Type Medicare   Authorization - Visit Number 22   Authorization - Number of Visits 26   PT Start Time [id number]   PT Stop Time 1815   PT Time Calculation (min) 43 min   Equipment Utilized During Treatment Gait belt   Activity Tolerance Patient tolerated treatment well   Behavior During Therapy WFL for tasks assessed/performed;Flat affect  Fatigued      Past Medical History  Diagnosis Date  . Varicose veins   . New onset atrial fibrillation ([place]) 01/02/2015  . Morbid obesity ([place]) 01/02/2015  . Hypertension   . Sleep apnea     been tested but has not received the CPAP yet  . GERD (gastroesophageal reflux disease)   . History of gout   . Cancer ([place]) 02/2015    colon  . Family history of colon cancer   . Family history of breast cancer in mother   . Colon cancer [facility])     March 2016    Past Surgical History  Procedure Laterality Date  . Vein ligation and stripping      left leg  . Colonoscopy N/A 03/28/2015    SLF: 1. Abdominal pain, diarrhea, rectal bleeding due to obstructing colon mass  . Esophagogastroduodenoscopy N/A 03/28/2015    SLF: 1. dysphagia 2. Mild non-erosive gastritis.   . Esophageal dilation N/A 03/28/2015    Procedure: ESOPHAGEAL DILATION;  Surgeon: [name], MD;  Location: [facility] END[facility];  Service: Endoscopy;  Laterality: N/A;  . Partial colectomy N/A 04/03/2015    Procedure: PARTIAL COLECTOMY;  Surgeon: [name], MD;  Location: [facility] ORS;  Service: General;  Laterality: N/A;  . Portacath placement N/A  05/10/2015    Procedure: INSERTION PORT-A-CATH;  Surgeon: [name], MD;  Location: [facility] ORS;  Service: General;  Laterality: N/A;  left subclavian  . Skin cancer destruction Left 08/16/15    left side of nose and left back  . Colonoscopy N/A 10/03/2015    SLF: normal anastomosis, fair prep (polyps less than 1cm could be missed), anal fissures and verrucous anal canal lesion with benign biopsy. Next TCS in 09/2016.  . Colonoscopy N/A 10/02/2015    SLF: normal anastomosis, poor bowel prep, three anal fissures    There were no vitals filed for this visit.  Visit Diagnosis:  Abnormality of gait  Leg weakness, bilateral  Difficulty walking  Decreased functional activity tolerance  Falls frequently      Subjective Assessment - 04/02/16 1733    Subjective Pt reported that both of his knees "are hurting today" that was rated a 7-8/10 on a VAS upon arrival. Pt reports that he has not been completing his HEP and that he is not motivated to complete them. He further reported that he sits for majority of his day. Spouse noted that the pt has been less active since his new medications were prescribed after being DC from the hospital. In addition, pt and spouse reported 15# of weight loss within the last 2 weeks, which  they attributed to the pt's restricted diet.   Patient is accompained by: Family member   Limitations Walking;Standing   Patient Stated Goals to improve overall balance, improve B hip strength, and reduce R knee pain    Currently in Pain? Yes   Pain Score 8    Pain Location Knee   Pain Orientation Right   Pain Descriptors / Indicators Aching   Pain Type Chronic pain   Pain Onset More than a month ago   Pain Frequency Constant   Aggravating Factors  WB activities    Pain Relieving Factors sitting and B LE elevation    Effect of Pain on Daily Activities limits his ability to ambulate household and community ambulation    Multiple Pain Sites Yes   Pain Score 7   Pain Location  Knee   Pain Orientation Left   Pain Descriptors / Indicators Aching   Pain Type Chronic pain   Pain Onset More than a month ago   Pain Frequency Constant   Aggravating Factors  WB activities    Pain Relieving Factors sitting and B LE elevation    Effect of Pain on Daily Activities limits his ability to ambulate household and community ambulation                          [facility] Adult PT Treatment/Exercise - 04/02/16 0001    Transfers   Transfers Sit to Stand;Stand to Sit   Sit to Stand 6: Modified independent (Device/Increase time)   Stand to Sit 6: Modified independent (Device/Increase time)   Ambulation/Gait   Ambulation/Gait Yes   Ambulation/Gait Assistance 5: Supervision to CGA x1   Ambulation Distance (Feet) 209 Feet   Assistive device Rolling walker   Gait Pattern Trendelenburg; shuffling gait pattern, decreased heel strikes   Gait Comments minor DOE assessed and reported    Posture/Postural Control   Posture/Postural Control Postural limitations   Postural Limitations Rounded Shoulders;Forward head;Increased thoracic kyphosis;Flexed trunk   Knee/Hip Exercises: Standing   Gait Training Indoor gait training with use of FWW x 85 x 1, 74' x1, and 50' x 1 with focus on heel to toe gait pattern and upright posture  with close supervision to CGA x 1; rest breaks required    Knee/Hip Exercises: Seated   Long Arc Quad Strengthening;2 sets;15 reps   Long Arc Quad Limitations blue thera-band    Marching Limitations 2 sets of 15 reps with blue thera-band   Hamstring Curl Strengthening;2 sets;15 reps;Both   Hamstring Limitations blue thera-band    Abduction/Adduction  Strengthening;Both;3 sets;10 reps;Other (comment)  completed abduction and adduction    Abd/Adduction Limitations blue thera-band    Sit to Sand 5 reps;1 set;with UE support             Balance Exercises - 04/02/16 1841    Balance Exercises: Standing   Standing Eyes Opened Narrow base of support  (BOS);Solid surface;3 reps;30 secs   Standing Eyes Closed Narrow base of support (BOS);Solid surface;3 reps;30 secs   Other Standing Exercises Unilateral UE funtional reaching across midline, left, and right x 2 sets of 10 reps, bilaterally    Overall Comments Other (comment);4 reps  A<>P and M<>L weight shfting with unilateral UE support            PT Education - 04/02/16 1747    Education provided Yes   Education Details Current HEP, pain management strategies, 5/5 fall precautions, and importance to avoid sitting for  prolonged periods of time    Person(s) Educated Patient;Spouse   Methods Explanation;Demonstration   Comprehension Verbalized understanding;Need further instruction          PT Short Term Goals - 03/27/16 0859    PT SHORT TERM GOAL #1   Title Pt to be independent  in Hep in order to obtain goals in a timely manner.    Time 3   Period Weeks   Status Achieved   PT SHORT TERM GOAL #2   Title Patient will be able to ambulate 53minutes without resting in order to progress to better health habits.    Baseline 2 minuntes with rest break required due to general fatigue an R knee pain    Time 3   Period Weeks   Status On-going   PT SHORT TERM GOAL #3   Title Pt to be able to stand for 10 minutes to be able to complete self grooming activity without fear of falling    Baseline unknown- not recently attempted due to recent hospitalization    Time 3   Period Weeks           PT Long Term Goals - 03/27/16 0900    PT LONG TERM GOAL #1   Title Pt to be Independent  in advance HEP in order to decrease frequency of falling    Time 6   Period Weeks   Status On-going   PT LONG TERM GOAL #2   Title Pt to have not fallen in the past two weeks for reduced risk of injury    Time 6   Period Weeks   Status Achieved   PT LONG TERM GOAL #3   Title Pt to be able to walk for 15 minutes without stopping for better health habits   Baseline 2 minuntes with rest break required  due to general fatigue and R knee pain    Time 6   Period Weeks   Status On-going   PT LONG TERM GOAL #4   Title Pt to be able to stand for 15 minutes to be safe in shower    Baseline unknown- not recently attempted due to recent hospitalization    Time 6   Period Weeks   Status On-going   PT LONG TERM GOAL #5   Title Pt will demo improved B hip strength to 4/5 MMT in order to be able to walk in his home.    Baseline B knee strength= WFL; refer to assessment for hip MMT    Time 6   Period Weeks   Status On-going   PT LONG TERM GOAL #6   Title Patient will improve TUG time to <14 sec in order to reduce the risk for falls with community ambulation.    Baseline 24 sec with FWW ( 03/27/2016)    Time 3   Period Weeks   Status On-going   PT LONG TERM GOAL #7   Title Patient will improve Berg balance score to >45/56 in order to reduce the risk for falls with household ambulation and with ADLs.    Baseline Berg= 39/56 ( 03/27/2016)    Time 6   Period Weeks   Status On-going               Plan - 04/02/16 1753    Clinical Impression Statement PT tx was focused on seated B LE strengthening, gait training, and static standing balance training. Initiated PT tx with seated resisted ther ex as documented with blue  thera-band with fair tolerance assessed. Pt required motivation and cues for proper technique and to complete each ther ex in available ROM. Progressed to gait training with FWW with focus on heel to toe sequence and upright posture. Pt presented with slow cadence, decreased heel strikes, and occasional shuffling gait patter. Pt required verbal cues to increase step length and heel strikes. Improved gait pattern demo once cues provided, but pt returned to a shuffling gait pattern. Seated rest breaks required after ambulating 84 feet secondary to general fatigue. Ended PT tx with static and dynamic standing balance activities. Pt was unsteady and demo minimal weight shifting in A<>P  direction during functional reaching activity. Rest breaks required between trials with balance training. Pt appeared more tired than usual. According to the pt he has been less active since his last PT visit due to lack of motivation to participate with his HEP and general mobility. Therapist thoroughly instructed and discussed the importance of remaining physically active on a daily basis and to complete his HEP to improve general strength and conditioning. Pt and spouse verbalized full understanding and agreement. Therapist further instructed the spouse to call the pt's PCP and neurologist to discuss medications, recent loss of weight, and reduced activity levels. Spouse verbalized full understanding and stated that she would call tomorrow. Pt would benefit from skilled PT to address current limitations/impairments including B LE weakness, B knee pain, impaired balance, difficulty with transfers, and difficulty with walking.    Pt will benefit from skilled therapeutic intervention in order to improve on the following deficits Abnormal gait;Decreased activity tolerance;Decreased balance;Decreased endurance;Decreased strength;Difficulty walking;Pain;Postural dysfunction   Rehab Potential Good   PT Frequency 2x / week   PT Duration 6 weeks   PT Treatment/Interventions Gait training;Functional mobility training;Therapeutic activities;Therapeutic exercise;Balance training;Patient/family education;Neuromuscular re-education;Moist Heat;Cryotherapy;Passive range of motion;Manual techniques;DME Instruction;ADLs/Self Care Home Management   PT Next Visit Plan Next visit to focus on seated B LE strengthening, gait training, and standing balance training    PT Home Exercise Plan Reviewed HEP with no changes made this vist    Consulted and Agree with Plan of Care Patient;Family member/caregiver   Family Member Consulted Spouse         Problem List Patient Active Problem List   Diagnosis Date Noted  . Chronic  anticoagulation 03/28/2016  . Anasarca 03/21/2016  . Cirrhosis ([place])   . Volume overload 03/16/2016  . Diastolic CHF, acute on chronic (HCC) 03/16/2016  . NASH (nonalcoholic steatohepatitis) 03/16/2016  . Hypoalbuminemia 03/16/2016  . Normocytic anemia 03/16/2016  . Thrombocytopenia ([place]) 03/16/2016  . Fatty liver 02/06/2016  . 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 Altered mental status   . Abnormal LFTs   . Fecal incontinence   . Peripheral edema 10/15/2015  . Generalized weakness 10/15/2015  . DVT (deep venous thrombosis) ([place]) 10/15/2015  . Atrial fibrillation ([place]) 10/15/2015  . Change in bowel habits   . Constipation 09/20/2015  . Elevated liver enzymes 09/20/2015  . Cellulitis 08/04/2015  . Family history of colon cancer   . Family history of breast cancer in mother   . Colon carcinoma ([place]) 04/03/2015  . Occult blood in stools   . Dysphagia, pharyngoesophageal phase   . Melena 03/15/2015  . Heme positive stool 03/15/2015  . Esophageal dysphagia 03/15/2015  . Diarrhea 03/15/2015  . Morbid obesity ([place]) 01/02/2015  . Varicose veins of lower extremities with other complications [id number]    Garen Lah, PT, DPT   04/02/2016, 6:44 PM  [place]  [facility] [address] [place], [place], [address] Phone: [phone]   Fax:  [phone]  Name: [name] MRN: [id number] Date of Birth: 08/07/1946

## 2016-04-04 ENCOUNTER — Ambulatory Visit (HOSPITAL_COMMUNITY): Payer: Medicare Other

## 2016-04-04 DIAGNOSIS — R296 Repeated falls: Secondary | ICD-10-CM

## 2016-04-04 DIAGNOSIS — R269 Unspecified abnormalities of gait and mobility: Secondary | ICD-10-CM | POA: Diagnosis not present

## 2016-04-04 DIAGNOSIS — M6281 Muscle weakness (generalized): Secondary | ICD-10-CM

## 2016-04-04 DIAGNOSIS — R262 Difficulty in walking, not elsewhere classified: Secondary | ICD-10-CM

## 2016-04-04 NOTE — Therapy (Signed)
[place], [place], [address] Phone: [phone]   Fax:  [phone]  Physical Therapy Treatment  Patient Details  Name: [name] MRN: [id number] Date of Birth: Jan 24, 1946 Referring Provider: Dr. [name]   Encounter Date: 04/04/2016      PT End of Session - 04/04/16 1124    Visit Number 23   Number of Visits 28   Date for PT Re-Evaluation Apr 27, 2016   Authorization Type Medicare   Authorization Time Period 02/27/2016 to 04/27/16; Reassessment and G-codes updated on 2016/04/14 visit 21   Authorization - Visit Number 23   Authorization - Number of Visits 26   PT Start Time 1106   PT Stop Time 1148   PT Time Calculation (min) 42 min   Equipment Utilized During Treatment Gait belt   Activity Tolerance Patient limited by pain;Patient limited by fatigue   Behavior During Therapy [facility] for tasks assessed/performed;Flat affect  Reports "feeling rough the last couple days", increased fatigue      Past Medical History  Diagnosis Date  . Varicose veins   . New onset atrial fibrillation ([place]) 01/02/2015  . Morbid obesity ([place]) 01/02/2015  . Hypertension   . Sleep apnea     been tested but has not received the CPAP yet  . GERD (gastroesophageal reflux disease)   . History of gout   . Cancer ([place]) 02/2015    colon  . Family history of colon cancer   . Family history of breast cancer in mother   . Colon cancer [facility])     March 2016    Past Surgical History  Procedure Laterality Date  . Vein ligation and stripping      left leg  . Colonoscopy N/A 15-Apr-2015    SLF: 1. Abdominal pain, diarrhea, rectal bleeding due to obstructing colon mass  . Esophagogastroduodenoscopy N/A 15-Apr-2015    SLF: 1. dysphagia 2. Mild non-erosive gastritis.   . Esophageal dilation N/A April 15, 2015    Procedure: ESOPHAGEAL DILATION;  Surgeon: [name], MD;  Location: [facility];  Service: Endoscopy;  Laterality: N/A;  .  Partial colectomy N/A 04/03/2015    Procedure: PARTIAL COLECTOMY;  Surgeon: [name], MD;  Location: [facility] ORS;  Service: General;  Laterality: N/A;  . Portacath placement N/A 05/10/2015    Procedure: INSERTION PORT-A-CATH;  Surgeon: [name], MD;  Location: [facility] ORS;  Service: General;  Laterality: N/A;  left subclavian  . Skin cancer destruction Left 08/16/15    left side of nose and left back  . Colonoscopy N/A 10/03/2015    SLF: normal anastomosis, fair prep (polyps less than 1cm could be missed), anal fissures and verrucous anal canal lesion with benign biopsy. Next TCS in 09/2016.  . Colonoscopy N/A 10/02/2015    SLF: normal anastomosis, poor bowel prep, three anal fissures    There were no vitals filed for this visit.  Visit Diagnosis:  Muscle weakness (generalized)  Difficulty in walking, not elsewhere classified  Falls frequently  Repeated falls      Subjective Assessment - 04/04/16 1114    Subjective Pt stated Bil knee pain scale 9/10 Rt > Lt very sore today.  Pt reports he has been feeling rough the last couple days, reports new medication prescribed   Pertinent History Patient had cancer surgery in april 2016 to removed a18inches of colon, mD beleives they removed all of colon, Patient began Chemo 05/17/15. patient had surgery 20 years ago  to "strip veins" and has sicne had swelling in both legs. patint [place] shooting [place], patient states limited physical activity due to bad knee and limited energy levels noting "I can t walk across the road."  "I feel worn out to where i can sit up for a while then have to sit back down and rest.  little pain with sit to stance.    Patient Stated Goals to improve overall balance, improve B hip strength, and reduce R knee pain    Currently in Pain? Yes   Pain Score 9    Pain Location Knee   Pain Orientation Right;Left   Pain Descriptors / Indicators Sore   Pain Onset More than a month ago   Pain Frequency Constant   Aggravating  Factors  WB activities   Pain Relieving Factors sitting and B LE elevation   Effect of Pain on Daily Activities limits his ability to ambulate household and community ambulation                                   PT Short Term Goals - 03/27/16 0859    PT SHORT TERM GOAL #1   Title Pt to be independent  in Hep in order to obtain goals in a timely manner.    Time 3   Period Weeks   Status Achieved   PT SHORT TERM GOAL #2   Title Patient will be able to ambulate 64minutes without resting in order to progress to better health habits.    Baseline 2 minuntes with rest break required due to general fatigue an R knee pain    Time 3   Period Weeks   Status On-going   PT SHORT TERM GOAL #3   Title Pt to be able to stand for 10 minutes to be able to complete self grooming activity without fear of falling    Baseline unknown- not recently attempted due to recent hospitalization    Time 3   Period Weeks           PT Long Term Goals - 03/27/16 0900    PT LONG TERM GOAL #1   Title Pt to be Independent  in advance HEP in order to decrease frequency of falling    Time 6   Period Weeks   Status On-going   PT LONG TERM GOAL #2   Title Pt to have not fallen in the past two weeks for reduced risk of injury    Time 6   Period Weeks   Status Achieved   PT LONG TERM GOAL #3   Title Pt to be able to walk for 15 minutes without stopping for better health habits   Baseline 2 minuntes with rest break required due to general fatigue and R knee pain    Time 6   Period Weeks   Status On-going   PT LONG TERM GOAL #4   Title Pt to be able to stand for 15 minutes to be safe in shower    Baseline unknown- not recently attempted due to recent hospitalization    Time 6   Period Weeks   Status On-going   PT LONG TERM GOAL #5   Title Pt will demo improved B hip strength to 4/5 MMT in order to be able to walk in his home.    Baseline B knee strength= WFL; refer to assessment  for hip MMT  Time 6   Period Weeks   Status On-going   PT LONG TERM GOAL #6   Title Patient will improve TUG time to <14 sec in order to reduce the risk for falls with community ambulation.    Baseline 24 sec with FWW ( 03/27/2016)    Time 3   Period Weeks   Status On-going   PT LONG TERM GOAL #7   Title Patient will improve Berg balance score to >45/56 in order to reduce the risk for falls with household ambulation and with ADLs.    Baseline Berg= 39/56 ( 03/27/2016)    Time 6   Period Weeks   Status On-going               Plan - 04/04/16 1133    Clinical Impression Statement Session focus on improving BLE strengthneing and improving for with gait training.  Pt limited by pain Bil knees 9/10 this session especially with weight bearing so minimal weight bearing activities complete this session.  Gait training complete initially this session with education on importance of posture to reduce increased weight bearing to Bil knee for pain relief.  Pt with flat affect behavior through session, reoprts he has been feeling rough the last couple days with constant changing of medication.  Pt advised to call MD and discuss current medication.   Pt will benefit from skilled therapeutic intervention in order to improve on the following deficits Abnormal gait;Decreased activity tolerance;Decreased balance;Decreased endurance;Decreased strength;Difficulty walking;Pain;Postural dysfunction   Rehab Potential Good   PT Frequency 2x / week   PT Duration 8 weeks (8 weeks more to try POWER for parkinson's )   PT Treatment/Interventions Gait training;Functional mobility training;Therapeutic activities;Therapeutic exercise;Balance training;Patient/family education;Neuromuscular re-education;Moist Heat;Cryotherapy;Passive range of motion;Manual techniques;DME Instruction;ADLs/Self Care Home Management   PT Next Visit Plan Next visit to focus on seated B LE strengthening, gait training, and standing  balance training    PT Home Exercise Plan Reviewed HEP with no changes made this vist         Problem List Patient Active Problem List   Diagnosis Date Noted  . Chronic anticoagulation 03/28/2016  . Anasarca 03/21/2016  . Cirrhosis ([place])   . Volume overload 03/16/2016  . Diastolic CHF, acute on chronic (HCC) 03/16/2016  . NASH (nonalcoholic steatohepatitis) 03/16/2016  . Hypoalbuminemia 03/16/2016  . Normocytic anemia 03/16/2016  . Thrombocytopenia ([place]) 03/16/2016  . Fatty liver 02/06/2016  . 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 Altered mental status   . Abnormal LFTs   . Fecal incontinence   . Peripheral edema 10/15/2015  . Generalized weakness 10/15/2015  . DVT (deep venous thrombosis) ([place]) 10/15/2015  . Atrial fibrillation ([place]) 10/15/2015  . Change in bowel habits   . Constipation 09/20/2015  . Elevated liver enzymes 09/20/2015  . Cellulitis 08/04/2015  . Family history of colon cancer   . Family history of breast cancer in mother   . Colon carcinoma ([place]) 04/03/2015  . Occult blood in stools   . Dysphagia, pharyngoesophageal phase   . Melena 03/15/2015  . Heme positive stool 03/15/2015  . Esophageal dysphagia 03/15/2015  . Diarrhea 03/15/2015  . Morbid obesity ([place]) 01/02/2015  . Varicose veins of lower extremities with other complications [id number]   Ihor Austin, LPTA; CBIS [phone]  Aldona Lento 04/04/2016, 12:43 PM Rayetta Humphrey, PT CLT [place] [address] [place], [place], [address] Phone: [phone]   Fax:  [phone]  Name: [name]  MRN: [id number] Date of Birth: 1946-09-26

## 2016-04-05 ENCOUNTER — Encounter: Payer: Self-pay | Admitting: Nutrition

## 2016-04-05 ENCOUNTER — Encounter: Payer: Medicare Other | Attending: Pulmonary Disease | Admitting: Nutrition

## 2016-04-05 DIAGNOSIS — I129 Hypertensive chronic kidney disease with stage 1 through stage 4 chronic kidney disease, or unspecified chronic kidney disease: Secondary | ICD-10-CM | POA: Insufficient documentation

## 2016-04-05 DIAGNOSIS — I10 Essential (primary) hypertension: Secondary | ICD-10-CM | POA: Insufficient documentation

## 2016-04-05 DIAGNOSIS — G20 Parkinson's disease: Secondary | ICD-10-CM

## 2016-04-05 DIAGNOSIS — Z6837 Body mass index (BMI) 37.0-37.9, adult: Secondary | ICD-10-CM | POA: Insufficient documentation

## 2016-04-05 DIAGNOSIS — G4730 Sleep apnea, unspecified: Secondary | ICD-10-CM | POA: Diagnosis not present

## 2016-04-05 DIAGNOSIS — I5021 Acute systolic (congestive) heart failure: Secondary | ICD-10-CM | POA: Insufficient documentation

## 2016-04-05 NOTE — Patient Instructions (Signed)
  Goals" 1. Follow My Plate Method 2. Do not skip meals. 3. Drink [employer] Breakfast if not hungry at meal time. 4. Take Levodopa 30-60 minutes before meals to not interfere with protein and reduce medication absorption for mobility.. 5. Drink 3-4 bottles of water per day to stay hydrated. Fluid intake per day should be around 1800 mls per day. Check with the heart doctor for sure. 6. Avoid salty food and use Mrs. Dash and other herbs and spices. 7. May have to talk to your family doctor about an appetite stimulant if needed. 8. Increase healthy vegetables as tolerated . 9. Talk to MD about taking a Multivitamin.  10. Attend Parkinson's Support Group

## 2016-04-05 NOTE — Progress Notes (Signed)
Medical Nutrition Therapy:  Appt start time: 0930 end time:  1030.  Assessment:  Primary concerns today: Obesity, weight loss and Parkinson's disease. PMH: , CHF and Colon Cancer than is in remission per pt. and his wife. Lives with his wife. She does the shopping and cooking. Most foods are baked and grilled. Uses Mrs. Dasah.  Eats 1-2 meals per day. Isn't hungry a lot.. Has lost about 100 lbs in the last year after his colon surgery. Is cancer free right now. Has lost 11 lbs from fluid possibly when he went in hospital with CHF and respiratory issues. On Levadopa and his wife is concerned about protein in diet and it's effect on the absorption of the medication for his mobility and cognative function. He walks with a walker. His wife notes that he is has memory issues a lot. Getting physical therapy twice a week. Diet is insuffient to meet his nutritional needs due to poor appetite. He is a picky eater and doesn't like many vegetables at all. He gets [employer] at times when he doesn't feel like eating. He is higher risk for Protein Calorie Malnutrition due to inadequate intake at meals and ongoing chronic weight loss.  Preferred Learning Style:  No preference indicated   Learning Readiness:   Ready  Change in progress   MEDICATIONS:    DIETARY INTAKE:  24-hr recall:  B ( AM):   Prtotein shake; CIB protein shake; most of time skips breakfast Snk ( AM): none  L ( PM):  Fish sandwich, milk 2%  Snk ( PM): bottle water. D ( PM): PB and jelly sandiwch, koolaid,  Snk ( PM): none Beverages: Water  Usual physical activity: ADL  Estimated energy needs: 1600-1800 calories 200 g carbohydrates 135 g protein 50 g fat  Progress Towards Goal(s):  In progress.   Nutritional Diagnosis:  NB-1.1 Food and nutrition-related knowledge deficit As related to Obesity.  As evidenced by BMI > 40.    Intervention:  Healthy eating habits using MY Plate, portion sizes, importance of  balanced meals for improve nutrition, fluid needs, healthy snacks. Discussed need to take Levadopa 30-60 minutes before meals, especiall protein to help increase the effectiveness of the Levodopa on muscle movement. Referred to the Parkinson's website for more information on the disease and complications/treatments. Discussed need for increase calorie intake for nutritional needs. Reviewed a low sodium higher fiber diet due to CHF and history of constipation.    Goals 1. Follow My Plate Method 2. Do not skip meals. 3. Drink [employer] Breakfast if not hungry at meal time. 4. Take Levodopa 30-60 minutes before meals to not interfere with protein to not interfer with absorption of medication for improved mobility. 5. Drink 3-4 bottles of water per day to stay hydrated. Fluid intake per day should be around 1800 mls per day. Check with the heart doctor for sure. 6. Avoid salty food and use Mrs. Dash and other herbs and spices. 7. May have to talk to your family doctor about an appetite stimulant if needed. 8. Increase healthy vegetables as tolerated . 9. Talk to MD about taking a Multivitamin.  10. Attend Parkinson's Support Group.  Teaching Method Utilized:  U2isual Auditory Hands on  Handouts given during visit include:  The Plate Method  Meal Plan Card  Parkinson's Nutrition TIps   Barriers to learning/adherence to lifestyle change: Parkinson's, short memory, physical limitations  Demonstrated degree of understanding via:  Teach Back   Monitoring/Evaluation:  Dietary intake, exercise,  meal planning, and body weight in 1 month(s).

## 2016-04-09 ENCOUNTER — Ambulatory Visit (HOSPITAL_COMMUNITY): Payer: Medicare Other | Admitting: Physical Therapy

## 2016-04-09 DIAGNOSIS — R279 Unspecified lack of coordination: Secondary | ICD-10-CM

## 2016-04-09 DIAGNOSIS — M6281 Muscle weakness (generalized): Secondary | ICD-10-CM

## 2016-04-09 DIAGNOSIS — R296 Repeated falls: Secondary | ICD-10-CM

## 2016-04-09 DIAGNOSIS — R262 Difficulty in walking, not elsewhere classified: Secondary | ICD-10-CM

## 2016-04-09 NOTE — Therapy (Signed)
[place], [place], [address] Phone: [phone]   Fax:  [phone]  Physical Therapy Treatment (Parkinsons re-assess)  Patient Details  Name: [name] MRN: [id number] Date of Birth: [birth year] Referring Provider: Dr. [name]   Encounter Date: 2016/05/08      PT End of Session - 05-08-16 1920    Visit Number 24   Number of Visits 32   Date for PT Re-Evaluation 05/07/16   Authorization Type Medicare   Authorization Time Period 02/27/2016 to 05/08/16; Reassessment and G-codes updated on May 08, 2016 for parkinsons visit    Authorization - Visit Number 24   Authorization - Number of Visits 34   PT Start Time [id number]   PT Stop Time 1730   PT Time Calculation (min) 42 min   Equipment Utilized During Treatment Gait belt   Activity Tolerance Patient limited by fatigue;Patient limited by pain   Behavior During Therapy [facility] for tasks assessed/performed      Past Medical History  Diagnosis Date  . Varicose veins   . New onset atrial fibrillation ([place]) 01/02/2015  . Morbid obesity ([place]) 01/02/2015  . Hypertension   . Sleep apnea     been tested but has not received the CPAP yet  . GERD (gastroesophageal reflux disease)   . History of gout   . Cancer ([place]) 02/2015    colon  . Family history of colon cancer   . Family history of breast cancer in mother   . Colon cancer [facility])     March 2016    Past Surgical History  Procedure Laterality Date  . Vein ligation and stripping      left leg  . Colonoscopy N/A 03/28/2015    SLF: 1. Abdominal pain, diarrhea, rectal bleeding due to obstructing colon mass  . Esophagogastroduodenoscopy N/A 03/28/2015    SLF: 1. dysphagia 2. Mild non-erosive gastritis.   . Esophageal dilation N/A 03/28/2015    Procedure: ESOPHAGEAL DILATION;  Surgeon: [name], MD;  Location: [facility] ENDO SUITE;  Service: Endoscopy;  Laterality: N/A;  . Partial colectomy N/A 04/03/2015   Procedure: PARTIAL COLECTOMY;  Surgeon: [name], MD;  Location: [facility] ORS;  Service: General;  Laterality: N/A;  . Portacath placement N/A 05/10/2015    Procedure: INSERTION PORT-A-CATH;  Surgeon: [name], MD;  Location: [facility] ORS;  Service: General;  Laterality: N/A;  left subclavian  . Skin cancer destruction Left 08/16/15    left side of nose and left back  . Colonoscopy N/A 10/03/2015    SLF: normal anastomosis, fair prep (polyps less than 1cm could be missed), anal fissures and verrucous anal canal lesion with benign biopsy. Next TCS in 09/2016.  . Colonoscopy N/A 10/02/2015    SLF: normal anastomosis, poor bowel prep, three anal fissures    There were no vitals filed for this visit.      Subjective Assessment - 08-May-2016 1652    Subjective Patient reports that he is feeling better than he has been the past couple weeks; his blood pressure medicine has been dropped recently as hhis pressure was getting too low.No falls or close calls recently. Patient has recently diagnosd with parkinsons however is not on any medicines yet, he is gonig to his parkinsons doctor next week. No freezing episodes. Has had posterior LOB however. No dizziness going on recently.    Pertinent History Patient had cancer surgery in april 2016 to removed a18inches of colon, mD beleives they removed  all of colon, Patient began Chemo 05/17/15. patient had surgery 20 years ago to "strip veins" and has sicne had swelling in both legs. patint [place] shooting pool, patient states limited physical activity due to bad knee and limited energy levels noting "I can t walk across the road."  "I feel worn out to where i can sit up for a while then have to sit back down and rest.  little pain with sit to stance.    How long can you stand comfortably? 4/11- 5-10 minutes    How long can you walk comfortably? 4/11- 5-10 minutes    Patient Stated Goals to improve overall balance, improve B hip strength, and reduce R knee pain     Currently in Pain? Yes   Pain Score 8    Pain Location Knee   Pain Orientation Right;Left   Pain Descriptors / Indicators Sore   Pain Type Chronic pain   Pain Radiating Towards none    Pain Onset More than a month ago   Pain Frequency Constant   Aggravating Factors  WB activigties    Pain Relieving Factors sitting and elevating    Effect of Pain on Daily Activities limits ambulation             [facility] PT Assessment - 04/09/16 0001    Assessment   Medical Diagnosis balance disorder, Parkinsons    Referring Provider Dr. [name]    Onset Date/Surgical Date 10/19/15   Next MD Visit 05/07/2016   Precautions   Precautions Fall   Restrictions   Weight Bearing Restrictions No   Balance Screen   How many times? no additional falls    [place] residence   Observation/Other Assessments   Observations tendency for posterior LOB; difficulty walking backwards; no RAM, rigidity, or freezing impairments; general stiffness of posture and trunk as noted in Parkinsons    Strength   Right Hip Flexion 4-/5   Right Hip Extension 3-/5   Right Hip ABduction 3+/5   Left Hip Flexion 4/5   Left Hip Extension 3-/5   Left Hip ABduction 4-/5   Berg Balance Test   Sit to Stand Able to stand without using hands and stabilize independently   Standing Unsupported Able to stand 2 minutes with supervision   Sitting with Back Unsupported but Feet Supported on Floor or Stool Able to sit safely and securely 2 minutes   Stand to Sit Sits safely with minimal use of hands   Transfers Able to transfer safely, definite need of hands   Standing Unsupported with Eyes Closed Able to stand 10 seconds safely   Standing Ubsupported with Feet Together Able to place feet together independently but unable to hold for 30 seconds  due to knee pain    From Standing, Reach Forward with Outstretched Arm Can reach forward >5 cm safely (2")   From Standing Position, Pick up Object  from Floor Able to pick up shoe, needs supervision   From Standing Position, Turn to Look Behind Over each Shoulder Looks behind one side only/other side shows less weight shift   Turn 360 Degrees Needs close supervision or verbal cueing   Standing Unsupported, Alternately Place Feet on Step/Stool Able to stand independently and complete 8 steps >20 seconds   Standing Unsupported, One Foot in [employer] balance while stepping or standing   Standing on One Leg Unable to try or needs assist to prevent fall   Total Score 36  PT Education - 04/09/16 1919    Education provided Yes   Education Details Parkinsons specific findings; ratinoale and benefits of PWR! program as well as flexibiltiy of program    Person(s) Educated Patient   Methods Explanation   Comprehension Verbalized understanding          PT Short Term Goals - 04/09/16 1924    PT SHORT TERM GOAL #1   Title Pt to be independent  in Hep in order to obtain goals in a timely manner.    PT SHORT TERM GOAL #2   Title Patient will be able to ambulate 14minutes without resting in order to progress to better health habits.    PT SHORT TERM GOAL #3   Title Pt to be able to stand for 10 minutes to be able to complete self grooming activity without fear of falling    PT SHORT TERM GOAL #4   Title Patient will average > 4000 steps a day           PT Long Term Goals - 04/09/16 1924    PT LONG TERM GOAL #1   Title Pt to be Independent  in advance HEP in order to decrease frequency of falling    PT LONG TERM GOAL #2   Title Pt to have not fallen in the past two weeks for reduced risk of injury    PT LONG TERM GOAL #3   Title Pt to be able to walk for 15 minutes without stopping for better health habits   PT LONG TERM GOAL #4   Title Pt to be able to stand for 15 minutes to be safe in shower    PT LONG TERM GOAL #5   Title Pt will demo improved B hip strength to 4/5 MMT in order to  be able to walk in his home.    PT LONG TERM GOAL #6   Title Patient will improve TUG time to <14 sec in order to reduce the risk for falls with community ambulation.    PT LONG TERM GOAL #7   Title Patient will improve Berg balance score to >45/56 in order to reduce the risk for falls with household ambulation and with ADLs.                Plan - 04/09/16 1921    Clinical Impression Statement Re-assessment performed today with emphasis on Parkinsons specific details and performance. Noted impaired balance, difficulty coordinatng backwards gait with small fast stpes and unsteadiness, general stiffness of trunk and posture, difficulty separating movements, muscle weakness, impaired posture, and reduced functional activity tolearnce. Suspect that Parkinsons may be influencing patient's mobilty based on unsteadiness in posterior direction as well as general stiffness of movement. Patient does experience severe knee pain that limits weight bearing, however is likely appropriate for supine/prone/quadruped/seated PWR! rourtines, and expect that he may have improvement in functional performance with focus on PWR! program and movements. AT this time recommend extension of skilled PT services to focus on gait, balance, and PWR! training.    Rehab Potential Good   PT Frequency 2x / week   PT Duration 8 weeks   PT Treatment/Interventions Gait training;Functional mobility training;Therapeutic activities;Therapeutic exercise;Balance training;Patient/family education;Neuromuscular re-education;Moist Heat;Cryotherapy;Passive range of motion;Manual techniques;DME Instruction;ADLs/Self Care Home Management;Biofeedback;Energy conservation;Taping   PT Next Visit Plan Supine and seated PWR! exercise; balance training; hip abduction strength    PT Home Exercise Plan Reviewed HEP with no changes made this vist    Consulted and Agree  with Plan of Care Patient      Patient will benefit from skilled therapeutic  intervention in order to improve the following deficits and impairments:  Abnormal gait, Decreased activity tolerance, Decreased balance, Decreased endurance, Decreased strength, Difficulty walking, Pain, Postural dysfunction, Decreased coordination, Hypomobility  Visit Diagnosis: Muscle weakness (generalized) - Plan: PT plan of care cert/re-cert  Difficulty in walking, not elsewhere classified - Plan: PT plan of care cert/re-cert  Repeated falls - Plan: PT plan of care cert/re-cert  Unspecified lack of coordination - Plan: PT plan of care cert/re-cert       G-Codes - Apr 25, 2016 1926    Functional Assessment Tool Used Foto and clinical findings including Berg balance score, MMT, pain, and functional mobility    Functional Limitation Mobility: Walking and moving around   Mobility: Walking and Moving Around Current Status [phone]) At least 40 percent but less than 60 percent impaired, limited or restricted   Mobility: Walking and Moving Around Goal Status [phone]) At least 20 percent but less than 40 percent impaired, limited or restricted      Problem List Patient Active Problem List   Diagnosis Date Noted  . Chronic anticoagulation 03/28/2016  . Anasarca 03/21/2016  . Cirrhosis ([place])   . Volume overload 03/16/2016  . Diastolic CHF, acute on chronic (HCC) 03/16/2016  . NASH (nonalcoholic steatohepatitis) 03/16/2016  . Hypoalbuminemia 03/16/2016  . Normocytic anemia 03/16/2016  . Thrombocytopenia ([place]) 03/16/2016  . Fatty liver 02/06/2016  . 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 Altered mental status   . Abnormal LFTs   . Fecal incontinence   . Peripheral edema 10/15/2015  . Generalized weakness 10/15/2015  . DVT (deep venous thrombosis) ([place]) 10/15/2015  . Atrial fibrillation ([place]) 10/15/2015  . Change in bowel habits   . Constipation 09/20/2015  . Elevated liver enzymes 09/20/2015  . Cellulitis 08/04/2015  . Family history of colon cancer   . Family history  of breast cancer in mother   . Colon carcinoma ([place]) 04/03/2015  . Occult blood in stools   . Dysphagia, pharyngoesophageal phase   . Melena 03/15/2015  . Heme positive stool 03/15/2015  . Esophageal dysphagia 03/15/2015  . Diarrhea 03/15/2015  . Morbid obesity ([place]) 01/02/2015  . Varicose veins of lower extremities with other complications [id number]    Deniece Ree PT, DPT [place] [address] [place], [place], [address] Phone: [phone]   Fax:  [phone]  Name: [name] MRN: [id number] Date of Birth: 01-Feb-1946

## 2016-04-10 ENCOUNTER — Encounter (HOSPITAL_COMMUNITY): Payer: Medicare Other

## 2016-04-11 ENCOUNTER — Encounter (HOSPITAL_COMMUNITY): Payer: Self-pay

## 2016-04-11 ENCOUNTER — Ambulatory Visit (HOSPITAL_COMMUNITY): Payer: Medicare Other | Admitting: Physical Therapy

## 2016-04-11 ENCOUNTER — Emergency Department (HOSPITAL_COMMUNITY): Payer: Medicare Other

## 2016-04-11 ENCOUNTER — Telehealth (HOSPITAL_COMMUNITY): Payer: Self-pay | Admitting: Physical Therapy

## 2016-04-11 ENCOUNTER — Inpatient Hospital Stay (HOSPITAL_COMMUNITY)
Admission: EM | Admit: 2016-04-11 | Discharge: 2016-04-16 | DRG: 065 | Disposition: A | Discharge status: Home / Self Care | Payer: Medicare Other | Attending: Pulmonary Disease | Admitting: Pulmonary Disease

## 2016-04-11 ENCOUNTER — Observation Stay (HOSPITAL_COMMUNITY): Payer: Medicare Other

## 2016-04-11 DIAGNOSIS — Z85038 Personal history of other malignant neoplasm of large intestine: Secondary | ICD-10-CM

## 2016-04-11 DIAGNOSIS — R4182 Altered mental status, unspecified: Secondary | ICD-10-CM | POA: Diagnosis not present

## 2016-04-11 DIAGNOSIS — I639 Cerebral infarction, unspecified: Secondary | ICD-10-CM | POA: Diagnosis not present

## 2016-04-11 DIAGNOSIS — Z86718 Personal history of other venous thrombosis and embolism: Secondary | ICD-10-CM

## 2016-04-11 DIAGNOSIS — G20 Parkinson's disease: Secondary | ICD-10-CM | POA: Diagnosis present

## 2016-04-11 DIAGNOSIS — I509 Heart failure, unspecified: Secondary | ICD-10-CM | POA: Diagnosis present

## 2016-04-11 DIAGNOSIS — I482 Chronic atrial fibrillation: Secondary | ICD-10-CM | POA: Diagnosis present

## 2016-04-11 DIAGNOSIS — I110 Hypertensive heart disease with heart failure: Secondary | ICD-10-CM | POA: Diagnosis present

## 2016-04-11 DIAGNOSIS — Z800 Family history of malignant neoplasm of digestive organs: Secondary | ICD-10-CM

## 2016-04-11 DIAGNOSIS — Z833 Family history of diabetes mellitus: Secondary | ICD-10-CM

## 2016-04-11 DIAGNOSIS — G4730 Sleep apnea, unspecified: Secondary | ICD-10-CM | POA: Diagnosis present

## 2016-04-11 DIAGNOSIS — Z8249 Family history of ischemic heart disease and other diseases of the circulatory system: Secondary | ICD-10-CM

## 2016-04-11 DIAGNOSIS — R410 Disorientation, unspecified: Secondary | ICD-10-CM | POA: Diagnosis not present

## 2016-04-11 DIAGNOSIS — Z9049 Acquired absence of other specified parts of digestive tract: Secondary | ICD-10-CM

## 2016-04-11 DIAGNOSIS — C189 Malignant neoplasm of colon, unspecified: Secondary | ICD-10-CM | POA: Diagnosis not present

## 2016-04-11 DIAGNOSIS — R32 Unspecified urinary incontinence: Secondary | ICD-10-CM | POA: Diagnosis present

## 2016-04-11 DIAGNOSIS — J449 Chronic obstructive pulmonary disease, unspecified: Secondary | ICD-10-CM | POA: Diagnosis present

## 2016-04-11 DIAGNOSIS — M1990 Unspecified osteoarthritis, unspecified site: Secondary | ICD-10-CM | POA: Diagnosis present

## 2016-04-11 DIAGNOSIS — G40209 Localization-related (focal) (partial) symptomatic epilepsy and epileptic syndromes with complex partial seizures, not intractable, without status epilepticus: Secondary | ICD-10-CM | POA: Diagnosis present

## 2016-04-11 DIAGNOSIS — I4891 Unspecified atrial fibrillation: Secondary | ICD-10-CM | POA: Diagnosis present

## 2016-04-11 DIAGNOSIS — Z6837 Body mass index (BMI) 37.0-37.9, adult: Secondary | ICD-10-CM

## 2016-04-11 DIAGNOSIS — Z7901 Long term (current) use of anticoagulants: Secondary | ICD-10-CM

## 2016-04-11 DIAGNOSIS — Z66 Do not resuscitate: Secondary | ICD-10-CM | POA: Diagnosis present

## 2016-04-11 DIAGNOSIS — Z803 Family history of malignant neoplasm of breast: Secondary | ICD-10-CM

## 2016-04-11 DIAGNOSIS — Z85828 Personal history of other malignant neoplasm of skin: Secondary | ICD-10-CM

## 2016-04-11 DIAGNOSIS — R400 Somnolence: Secondary | ICD-10-CM

## 2016-04-11 DIAGNOSIS — K7460 Unspecified cirrhosis of liver: Secondary | ICD-10-CM | POA: Diagnosis present

## 2016-04-11 DIAGNOSIS — N179 Acute kidney failure, unspecified: Secondary | ICD-10-CM | POA: Diagnosis present

## 2016-04-11 DIAGNOSIS — Z9221 Personal history of antineoplastic chemotherapy: Secondary | ICD-10-CM

## 2016-04-11 DIAGNOSIS — K219 Gastro-esophageal reflux disease without esophagitis: Secondary | ICD-10-CM | POA: Diagnosis present

## 2016-04-11 DIAGNOSIS — K625 Hemorrhage of anus and rectum: Secondary | ICD-10-CM | POA: Diagnosis not present

## 2016-04-11 DIAGNOSIS — Z801 Family history of malignant neoplasm of trachea, bronchus and lung: Secondary | ICD-10-CM

## 2016-04-11 HISTORY — DX: Parkinson's disease: G20

## 2016-04-11 HISTORY — DX: Parkinson's disease without dyskinesia, without mention of fluctuations: G20.A1

## 2016-04-11 LAB — BASIC METABOLIC PANEL
ANION GAP: 10 (ref 5–15)
BUN: 52 mg/dL — AB (ref 6–20)
CALCIUM: 9.2 mg/dL (ref 8.9–10.3)
CO2: 33 mmol/L — ABNORMAL HIGH (ref 22–32)
CREATININE: 1.85 mg/dL — AB (ref 0.61–1.24)
Chloride: 94 mmol/L — ABNORMAL LOW (ref 101–111)
GFR calc Af Amer: 41 mL/min — ABNORMAL LOW (ref >60)
GFR, EST NON AFRICAN AMERICAN: 36 mL/min — AB (ref >60)
GLUCOSE: 134 mg/dL — AB (ref 65–99)
Potassium: 3.6 mmol/L (ref 3.5–5.1)
Sodium: 137 mmol/L (ref 135–145)

## 2016-04-11 LAB — CBC WITH DIFFERENTIAL/PLATELET
BASOS ABS: 0 10*3/uL (ref 0.0–0.1)
Basophils Relative: 0 %
EOS PCT: 3 %
Eosinophils Absolute: 0.3 10*3/uL (ref 0.0–0.7)
HCT: 40.1 % (ref 39.0–52.0)
Hemoglobin: 14.8 g/dL (ref 13.0–17.0)
LYMPHS PCT: 17 %
Lymphs Abs: 1.9 10*3/uL (ref 0.7–4.0)
MCH: 34.3 pg — ABNORMAL HIGH (ref 26.0–34.0)
MCHC: 36.9 g/dL — AB (ref 30.0–36.0)
MCV: 92.8 fL (ref 78.0–100.0)
MONO ABS: 1.4 10*3/uL — AB (ref 0.1–1.0)
MONOS PCT: 12 %
Neutro Abs: 7.5 10*3/uL (ref 1.7–7.7)
Neutrophils Relative %: 68 %
PLATELETS: 136 10*3/uL — AB (ref 150–400)
RBC: 4.32 MIL/uL (ref 4.22–5.81)
RDW: 13 % (ref 11.5–15.5)
WBC: 11.1 10*3/uL — ABNORMAL HIGH (ref 4.0–10.5)

## 2016-04-11 LAB — URINALYSIS, ROUTINE W REFLEX MICROSCOPIC
BILIRUBIN URINE: NEGATIVE
GLUCOSE, UA: NEGATIVE mg/dL
HGB URINE DIPSTICK: NEGATIVE
KETONES UR: NEGATIVE mg/dL
Nitrite: NEGATIVE
PH: 5.5 (ref 5.0–8.0)
PROTEIN: NEGATIVE mg/dL
Specific Gravity, Urine: 1.01 (ref 1.005–1.030)

## 2016-04-11 LAB — HEPATIC FUNCTION PANEL
ALT: 21 U/L (ref 17–63)
AST: 36 U/L (ref 15–41)
Albumin: 3.7 g/dL (ref 3.5–5.0)
Alkaline Phosphatase: 99 U/L (ref 38–126)
BILIRUBIN INDIRECT: 1.4 mg/dL — AB (ref 0.3–0.9)
BILIRUBIN TOTAL: 1.8 mg/dL — AB (ref 0.3–1.2)
Bilirubin, Direct: 0.4 mg/dL (ref 0.1–0.5)
Total Protein: 8.2 g/dL — ABNORMAL HIGH (ref 6.5–8.1)

## 2016-04-11 LAB — LIPASE, BLOOD: Lipase: 42 U/L (ref 11–51)

## 2016-04-11 LAB — URINE MICROSCOPIC-ADD ON

## 2016-04-11 MED ORDER — PANTOPRAZOLE SODIUM 40 MG PO TBEC: 40.0000 mg | DELAYED_RELEASE_TABLET | Freq: Every day | ORAL | Status: DC

## 2016-04-11 MED ORDER — CARBIDOPA-LEVODOPA 25-100 MG PO TABS
1.0000 | ORAL_TABLET | Freq: Every day | ORAL | Status: DC
  Administered 2016-04-12 – 2016-04-16 (×5): 1 via ORAL
  Filled 2016-04-11 (×5): qty 1

## 2016-04-11 MED ORDER — SODIUM CHLORIDE 0.9 % IV SOLN
INTRAVENOUS | Status: DC
  Administered 2016-04-12: 08:00:00 via INTRAVENOUS

## 2016-04-11 MED ORDER — ESCITALOPRAM OXALATE 10 MG PO TABS
20.0000 mg | ORAL_TABLET | Freq: Every day | ORAL | Status: DC
  Administered 2016-04-12 – 2016-04-15 (×4): 20 mg via ORAL
  Filled 2016-04-11: qty 2
  Filled 2016-04-11: qty 1
  Filled 2016-04-11 (×3): qty 2

## 2016-04-11 MED ORDER — SENNOSIDES-DOCUSATE SODIUM 8.6-50 MG PO TABS: 1.0000 | ORAL_TABLET | Freq: Every evening | ORAL | Status: DC | PRN

## 2016-04-11 MED ORDER — DABIGATRAN ETEXILATE MESYLATE 150 MG PO CAPS
150.0000 mg | ORAL_CAPSULE | Freq: Two times a day (BID) | ORAL | Status: DC
  Administered 2016-04-12 – 2016-04-16 (×9): 150 mg via ORAL
  Filled 2016-04-11 (×12): qty 1

## 2016-04-11 MED ORDER — PANTOPRAZOLE SODIUM 40 MG PO TBEC
40.0000 mg | DELAYED_RELEASE_TABLET | Freq: Every day | ORAL | Status: DC
  Administered 2016-04-12 – 2016-04-14 (×3): 40 mg via ORAL
  Filled 2016-04-11 (×4): qty 1

## 2016-04-11 MED ORDER — SODIUM CHLORIDE 0.9 % IV BOLUS (SEPSIS)
250.0000 mL | Freq: Once | INTRAVENOUS | Status: AC
  Administered 2016-04-11: 250 mL via INTRAVENOUS

## 2016-04-11 MED ORDER — LEVETIRACETAM 500 MG PO TABS
500.0000 mg | ORAL_TABLET | Freq: Two times a day (BID) | ORAL | Status: DC
  Administered 2016-04-12 – 2016-04-16 (×9): 500 mg via ORAL
  Filled 2016-04-11 (×9): qty 1

## 2016-04-11 MED ORDER — SODIUM CHLORIDE 0.9 % IV SOLN
INTRAVENOUS | Status: DC
  Administered 2016-04-11: 17:00:00 via INTRAVENOUS

## 2016-04-11 MED ORDER — STROKE: EARLY STAGES OF RECOVERY BOOK
Freq: Once | Status: AC
  Administered 2016-04-12: 16:00:00
  Filled 2016-04-11: qty 1

## 2016-04-11 NOTE — ED Notes (Signed)
Pt in MRI, will update vitals upon patient's return.

## 2016-04-11 NOTE — ED Notes (Signed)
Pt and family made aware a urine specimen was needed. Verbalized understanding and will notify staff when one can be obtained.

## 2016-04-11 NOTE — H&P (Signed)
PCP:   [name], MD   Chief Complaint:  Episodes of confusion  HPI: 70 year old male who   has a past medical history of Varicose veins; New onset atrial fibrillation ([place]) (01/02/2015); Morbid obesity ([place]) (01/02/2015); Hypertension; Sleep apnea; GERD (gastroesophageal reflux disease); History of gout; Cancer ([place]) (02/2015); Family history of colon cancer; Family history of breast cancer in mother; Colon cancer ([place]); and Parkinson disease ([place]). Today presents to the hospital with intermittent episodes of confusion for past 2 days. As per patient's wife patient has been having these episodes since October last year, his last episode of confusion was a month ago. Wife describes this as weird behavior as patient would "start urinating on the floor, putting socks over his shoes". But these episodes last only for a few hours and patient is back to his normal self. At this time patient is not confused. Workup in the ED showed tiny acute lacunar infarct in the right mid brain. Neurology at [facility] was contacted by the ED physician, Dr. Nicole Kindred reviewed the MRI and did not feel that the small lacunar infarct is causing the confusion. Patient denies any chest pain, no nausea vomiting or diarrhea. No fever or dysuria.  Allergies:  No Known Allergies    Past Medical History  Diagnosis Date  . Varicose veins   . New onset atrial fibrillation ([place]) 01/02/2015  . Morbid obesity ([place]) 01/02/2015  . Hypertension   . Sleep apnea     been tested but has not received the CPAP yet  . GERD (gastroesophageal reflux disease)   . History of gout   . Cancer ([place]) 02/2015    colon  . Family history of colon cancer   . Family history of breast cancer in mother   . Colon cancer [facility])     March 2016  . Parkinson disease [facility])     Past Surgical History  Procedure Laterality Date  . Vein ligation and stripping      left leg  . Colonoscopy N/A 03/28/2015    SLF: 1. Abdominal pain, diarrhea,  rectal bleeding due to obstructing colon mass  . Esophagogastroduodenoscopy N/A 03/28/2015    SLF: 1. dysphagia 2. Mild non-erosive gastritis.   . Esophageal dilation N/A 03/28/2015    Procedure: ESOPHAGEAL DILATION;  Surgeon: [name], MD;  Location: [facility] ENDO SU[facility];  Service: Endoscopy;  Laterality: N/A;  . Partial colectomy N/A 04/03/2015    Procedure: PARTIAL COLECTOMY;  Surgeon: [name], MD;  Location: [facility] ORS;  Service: General;  Laterality: N/A;  . Portacath placement N/A 05/10/2015    Procedure: INSERTION PORT-A-CATH;  Surgeon: [name], MD;  Location: [facility] ORS;  Service: General;  Laterality: N/A;  left subclavian  . Skin cancer destruction Left 08/16/15    left side of nose and left back  . Colonoscopy N/A 10/03/2015    SLF: normal anastomosis, fair prep (polyps less than 1cm could be missed), anal fissures and verrucous anal canal lesion with benign biopsy. Next TCS in 09/2016.  . Colonoscopy N/A 10/02/2015    SLF: normal anastomosis, poor bowel prep, three anal fissures    Prior to Admission medications   Medication Sig Start Date End Date Taking? Authorizing Provider  albuterol (PROVENTIL HFA;VENTOLIN HFA) 108 (90 Base) MCG/ACT inhaler Inhale 1-2 puffs into the lungs every 6 (six) hours as needed for wheezing or shortness of breath. Reported on 02/20/2016   Yes Historical Provider, MD  bumetanide (BUMEX) 2 MG tablet Take 2  mg by mouth 2 (two) times daily.    Yes Historical Provider, MD  carbidopa-levodopa (SINEMET IR) 25-100 MG tablet Take 1 tablet by mouth daily. Take 1 tablet once daily x1 week, 1 tablet twice daily x1 week, 1 tablet three times daily. 03/13/16  Yes Historical Provider, MD  dabigatran (PRADAXA) 150 MG CAPS capsule Take 150 mg by mouth 2 (two) times daily.   Yes Historical Provider, MD  dicyclomine (BENTYL) 10 MG capsule Take 1 capsule (10 mg total) by mouth 2 (two) times daily. 1 po 30 MINS BEFORE BREAKFAST AND MAY REPEAT BEFORE LUNCH. 03/22/16  Yes [name], MD  escitalopram (LEXAPRO) 20 MG tablet Take 1 tablet (20 mg total) by mouth daily. 01/02/16  Yes [name], PA-C  levETIRAcetam (KEPPRA) 500 MG tablet Take 1 tablet (500 mg total) by mouth 2 (two) times daily. 10/24/15  Yes [name], MD  meclizine (ANTIVERT) 25 MG tablet Take 1 tablet (25 mg total) by mouth 3 (three) times daily as needed for dizziness. 03/22/16  Yes [name], MD  metolazone (ZAROXOLYN) 2.5 MG tablet Take 2.5 mg by mouth every Thursday.   Yes Historical Provider, MD  nitroGLYCERIN (NITROGLYN) 2 % ointment Apply 0.5 inches topically as needed for chest pain. Reported on 02/20/2016   Yes Historical Provider, MD  omeprazole (PRILOSEC) 20 MG capsule Take 1 capsule (20 mg total) by mouth daily. 03/15/15  Yes [name], PA-C  potassium chloride SA (K-DUR,KLOR-CON) 20 MEQ tablet Take 1 tablet (20 mEq total) by mouth 2 (two) times daily. 03/22/16  Yes [name], MD  psyllium (METAMUCIL) 58.6 % packet Take 1 packet by mouth daily.   Yes Historical Provider, MD  spironolactone (ALDACTONE) 25 MG tablet Take 0.5 tablets (12.5 mg total) by mouth 2 (two) times daily. 03/22/16  Yes [name], MD  losartan (COZAAR) 50 MG tablet Take 1 tablet (50 mg total) by mouth daily. Patient not taking: Reported on 04/11/2016 03/28/16   Erlene Quan, PA-C    Social History:  reports that he has never smoked. He has never used smokeless tobacco. He reports that he does not drink alcohol or use illicit drugs.  Family History  Problem Relation Age of Onset  . Breast cancer Mother     dx under 9  . Colon cancer Mother     dx in her mid 29s  . Heart disease Father   . Hyperlipidemia Father   . Liver cancer Father     heavy ETOH user when young  . Cancer Sister     slow "blood" cancer  . Diabetes Sister   . Heart disease Sister   . Diabetes Brother   . Cancer Maternal Aunt     2-3 maternal aunts with Cancer NOS  . Lung cancer Paternal Uncle     three uncles with  lung cancer - all smokers  . Cancer Paternal Grandmother     possible bone cancer    There were no vitals filed for this visit.  All the positives are listed in BOLD  Review of Systems:  HEENT: Headache, blurred vision, runny nose, sore throat Neck: Hypothyroidism, hyperthyroidism,,lymphadenopathy Chest : Shortness of breath, history of COPD, Asthma Heart : Chest pain, history of coronary arterey disease GI:  Nausea, vomiting, diarrhea, constipation, GERD GU: Dysuria, urgency, frequency of urination, hematuria Neuro: Stroke, seizures, syncope, intermittent confusion Psych: Depression, anxiety, hallucinations   Physical Exam: Blood pressure 103/59, pulse 70, temperature 97.9 F (36.6 C), temperature source  Oral, resp. rate 12, SpO2 98 %. Constitutional:   Patient is a well-developed and well-nourished male in no acute distress and cooperative with exam. Head: Normocephalic and atraumatic Mouth: Mucus membranes moist Eyes: PERRL, EOMI, conjunctivae normal Neck: Supple, No Thyromegaly Cardiovascular: RRR, S1 normal, S2 normal Pulmonary/Chest: CTAB, no wheezes, rales, or rhonchi Abdominal: Soft. Non-tender, non-distended, bowel sounds are normal, no masses, organomegaly, or guarding present.  Neurological: A&O x3, Strength is normal and symmetric bilaterally, cranial nerve II-XII are grossly intact, no focal motor deficit, sensory intact to light touch bilaterally.  Extremities : No Cyanosis, Clubbing or Edema  Labs on Admission:  Basic Metabolic Panel:  Recent Labs Lab 04/11/16 1637  NA 137  K 3.6  CL 94*  CO2 33*  GLUCOSE 134*  BUN 52*  CREATININE 1.85*  CALCIUM 9.2   Liver Function Tests:  Recent Labs Lab 04/11/16 1637  AST 36  ALT 21  ALKPHOS 99  BILITOT 1.8*  PROT 8.2*  ALBUMIN 3.7    Recent Labs Lab 04/11/16 1637  LIPASE 42   No results for input(s): AMMONIA in the last 168 hours. CBC:  Recent Labs Lab 04/11/16 1637  WBC 11.1*  NEUTROABS 7.5   HGB 14.8  HCT 40.1  MCV 92.8  PLT 136*   Cardiac Enzymes: No results for input(s): CKTOTAL, CKMB, CKMBINDEX, TROPONINI in the last 168 hours.  BNP (last 3 results)  Recent Labs  12/27/15 1039 01/14/16 1200 03/15/16 2341  BNP 254.0* 256.0* 274.0*     Radiological Exams on Admission: Dg Chest 2 View  04/11/2016  CLINICAL DATA:  Altered mental status/confusion EXAM: CHEST  2 VIEW COMPARISON:  March 15, 2016 FINDINGS: Port-A-Cath tip is at the junction of the left innominate vein and superior vena cava. No pneumothorax. There is no edema or consolidation. Heart size and pulmonary vascularity are normal. No adenopathy. There is degenerative change in the lower thoracic and visualized lumbar regions. There is slight anterior wedging of the L1 vertebral body, stable. IMPRESSION: No edema or consolidation. Stable cardiac silhouette. Port-A-Cath tip at the junction of the left innominate vein and superior vena cava. No pneumothorax. Electronically Signed   By: Lowella Grip III M.D.   On: 04/11/2016 17:16   Ct Head Wo Contrast  04/11/2016  CLINICAL DATA:  Weakness, altered mental status, and confusion today, history colon cancer, skin cancer, hypertension, multiple falls in past month EXAM: CT HEAD WITHOUT CONTRAST TECHNIQUE: Contiguous axial images were obtained from the base of the skull through the vertex without intravenous contrast. COMPARISON:  10/19/2015 FINDINGS: Generalized atrophy. Normal ventricular morphology. No midline shift or mass effect. Otherwise normal appearance of brain parenchyma. No intracranial hemorrhage, mass lesion or evidence acute infarction. No extra-axial fluid collections. Mucosal retention cyst LEFT maxillary sinus. Minimal fluid in a few RIGHT mastoid air cells. No acute bone or sinus abnormalities otherwise identified. IMPRESSION: Generalized atrophy. No acute intracranial abnormalities. Electronically Signed   By: Lavonia Dana M.D.   On: 04/11/2016 17:48   Mr  Brain Wo Contrast  04/11/2016  CLINICAL DATA:  70 year old male with weakness, confusion, multiple recent falls. Initial encounter. EXAM: MRI HEAD WITHOUT CONTRAST TECHNIQUE: Multiplanar, multiecho pulse sequences of the brain and surrounding structures were obtained without intravenous contrast. COMPARISON:  Head CT 1739 hours today.  Brain MRI 10/20/2015. FINDINGS: Major intracranial vascular flow voids are stable, with chronic abnormal appearance of the bilateral distal vertebral arteries, although as before there appears to be reconstituted flow in the basilar artery. There  is a subtle punctate area of restricted diffusion in the right paracentral lower midbrain seen on both series 100, image 15 and series 101, image 28. No associated T2 or FLAIR hyperintensity. No associated hemorrhage or mass effect. No restricted diffusion elsewhere. No midline shift, mass effect, evidence of mass lesion, ventriculomegaly, extra-axial collection or acute intracranial hemorrhage. Cervicomedullary junction and pituitary are within normal limits. Negative visualized cervical spine. Stable gray and white matter signal, largely unremarkable for age throughout the brain. No cortical encephalomalacia or chronic cerebral blood products. Visible internal auditory structures appear normal. Stable mild mastoid effusions. New mucosal thickening or mucous retention cyst in the left maxillary sinus. Stable mild ethmoid mucosal thickening. Orbit and scalp soft tissues are negative. Visualized bone marrow signal is within normal limits. IMPRESSION: 1. Suggestion of a tiny acute lacunar infarct in the right midbrain. 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 2. No associated hemorrhage or mass effect. No other acute intracranial abnormality. Electronically Signed   By: Genevie Ann M.D.   On: 04/11/2016 19:11    EKG: Independently reviewed. Atrial fibrillation   Assessment/Plan Active Problems:   Colon carcinoma  (HCC)   CVA (cerebral infarction)   Stroke [facility])   Confusion   Acute lacunar infarct We'll admit the patient and initiate stroke workup as per protocol. Continue Pradaxa, which she has been taking for DVT Will obtain echo, MRI brain, carotid Doppler  Intermittent episodes of confusion Patient has been having these episodes of confusion since October last year. ?? Medication induced, patient is on Keppra and Sinemet. UA is clear, chest excision showed no pneumonia.  History of colon cancer Patient is status post chemotherapy. Stable.  History of DVT Continue anticoagulation with Pradaxa  History of atrial fibrillation Heart rate is controlled, continue anticoagulation as above     Code status: DO NOT RESUSCITATE  Family discussion: Admission, patients condition and plan of care including tests being ordered have been discussed with the patient and *his wife and son at bedside* who indicate understanding and agree with the plan and Code Status.   Time Spent on Admission: 60 min  [facility] Pager: [phone] 04/11/2016, 8:44 PM  If 7PM-7AM, please contact night-coverage  [email or web address]  Password [email or web address]

## 2016-04-11 NOTE — Telephone Encounter (Signed)
Cancel today's appt due to medical status please. We will play it by ear moving forward with upcoming appointments. KEU

## 2016-04-11 NOTE — Telephone Encounter (Signed)
Patient's wife had called clinic reporting that Mr. Fincannon had low BP today (70s/40s) and was performing actions such as pooping on the floor and putting socks over his shoes; front desk made PT aware, and PT had front desk tell patient's wife to call emergency services ASAP- however per front desk, pateint's wife stated "I don't know if he'll let me".  PT followed up with patient/patient's wife later this afternoon. Patient's wife reported over phone that patient's BP had improved to 103/69 however he was still acting erratically "..he just peed on the floor for the 4th time". PT again urgently advised that patient in need of emergency services, and wife reported that they are going to get patient to [facility] ED as soon as possible at this point as her son just arrived. Wife also asked PT if any of this was normal for Parkinsons, as she had assumed some of it was; PT emphatically explained that it was not and patient's signs/symptoms indicate that he is in need of medical care immediately. Wife became tearful, stating that she just does not understand what is normal for Parkinsons; PT advised that the next time patient is healthy and able to come in, wife should come back and PT will educate on normal Parkinsons symtpoms/signs.  Deniece Ree PT, DPT [phone]

## 2016-04-11 NOTE — ED Notes (Signed)
Wife reports pt's bp was 68/49.  Reports has been confused today.  He put is socks on over his shoes and used the restroom in the floor.  Pt presently alert and oriented and says he remembers not feeling "clear" earlier today.  Pt says he feels better now but wife says he is still acting confused.  PT denies pain but reports generalized weakness.

## 2016-04-11 NOTE — ED Provider Notes (Signed)
CSN: [id number]     Arrival date & time 04/11/16  1613 History   First MD Initiated Contact with Patient 04/11/16 1635     Chief Complaint  Patient presents with  . Hypotension  . Altered Mental Status     (Consider location/radiation/quality/duration/timing/severity/associated sxs/prior Treatment) Patient is a 70 y.o. male presenting with altered mental status. The history is provided by the patient and the spouse.  Altered Mental Status Presenting symptoms: confusion   Associated symptoms: no abdominal pain, no fever, no headaches and no rash    patient brought in by his spouse. Patient had confusion this morning increased confusion also reports that blood pressure 68/49. Patient also put his socks over his shoes and now urinated on the floor this occurred in the wee hours of morning. Patient's wife says is now improved but still acting confused. Questionable generalized weakness. Patient supposedly is being worked up for possible Parkinson's.  Past Medical History  Diagnosis Date  . Varicose veins   . New onset atrial fibrillation ([place]) 01/02/2015  . Morbid obesity ([place]) 01/02/2015  . Hypertension   . Sleep apnea     been tested but has not received the CPAP yet  . GERD (gastroesophageal reflux disease)   . History of gout   . Cancer ([place]) 02/2015    colon  . Family history of colon cancer   . Family history of breast cancer in mother   . Colon cancer [facility])     March 2016  . Parkinson disease [facility])    Past Surgical History  Procedure Laterality Date  . Vein ligation and stripping      left leg  . Colonoscopy N/A 03/28/2015    SLF: 1. Abdominal pain, diarrhea, rectal bleeding due to obstructing colon mass  . Esophagogastroduodenoscopy N/A 03/28/2015    SLF: 1. dysphagia 2. Mild non-erosive gastritis.   . Esophageal dilation N/A 03/28/2015    Procedure: ESOPHAGEAL DILATION;  Surgeon: [name], MD;  Location: [facility] ENDO SUITE;  Service: Endoscopy;  Laterality: N/A;  . Partial  colectomy N/A 04/03/2015    Procedure: PARTIAL COLECTOMY;  Surgeon: [name], MD;  Location: [facility] ORS;  Service: General;  Laterality: N/A;  . Portacath placement N/A 05/10/2015    Procedure: INSERTION PORT-A-CATH;  Surgeon: [name], MD;  Location: [facility] ORS;  Service: General;  Laterality: N/A;  left subclavian  . Skin cancer destruction Left 08/16/15    left side of nose and left back  . Colonoscopy N/A 10/03/2015    SLF: normal anastomosis, fair prep (polyps less than 1cm could be missed), anal fissures and verrucous anal canal lesion with benign biopsy. Next TCS in 09/2016.  . Colonoscopy N/A 10/02/2015    SLF: normal anastomosis, poor bowel prep, three anal fissures   Family History  Problem Relation Age of Onset  . Breast cancer Mother     dx under 70  . Colon cancer Mother     dx in her mid 73s  . Heart disease Father   . Hyperlipidemia Father   . Liver cancer Father     heavy ETOH user when young  . Cancer Sister     slow "blood" cancer  . Diabetes Sister   . Heart disease Sister   . Diabetes Brother   . Cancer Maternal Aunt     2-3 maternal aunts with Cancer NOS  . Lung cancer Paternal Uncle     three uncles with lung cancer - all smokers  .  Cancer Paternal Grandmother     possible bone cancer   Social History  Substance Use Topics  . Smoking status: Never Smoker   . Smokeless tobacco: Never Used  . Alcohol Use: No    Review of Systems  Constitutional: Negative for fever.  HENT: Negative for congestion.   Eyes: Negative for visual disturbance.  Respiratory: Negative for shortness of breath.   Cardiovascular: Negative for chest pain.  Gastrointestinal: Negative for abdominal pain.  Genitourinary: Negative for dysuria.  Skin: Negative for rash.  Neurological: Negative for headaches.  Hematological: Bruises/bleeds easily.  Psychiatric/Behavioral: Positive for confusion.      Allergies  Review of patient's allergies indicates no known  allergies.  Home Medications   Prior to Admission medications   Medication Sig Start Date End Date Taking? Authorizing Provider  albuterol (PROVENTIL HFA;VENTOLIN HFA) 108 (90 Base) MCG/ACT inhaler Inhale 1-2 puffs into the lungs every 6 (six) hours as needed for wheezing or shortness of breath. Reported on 02/20/2016   Yes Historical Provider, MD  bumetanide (BUMEX) 2 MG tablet Take 2 mg by mouth 2 (two) times daily.    Yes Historical Provider, MD  carbidopa-levodopa (SINEMET IR) 25-100 MG tablet Take 1 tablet by mouth daily. Take 1 tablet once daily x1 week, 1 tablet twice daily x1 week, 1 tablet three times daily. 03/13/16  Yes Historical Provider, MD  dabigatran (PRADAXA) 150 MG CAPS capsule Take 150 mg by mouth 2 (two) times daily.   Yes Historical Provider, MD  dicyclomine (BENTYL) 10 MG capsule Take 1 capsule (10 mg total) by mouth 2 (two) times daily. 1 po 30 MINS BEFORE BREAKFAST AND MAY REPEAT BEFORE LUNCH. 03/22/16  Yes [name], MD  escitalopram (LEXAPRO) 20 MG tablet Take 1 tablet (20 mg total) by mouth daily. 01/02/16  Yes [name], PA-C  levETIRAcetam (KEPPRA) 500 MG tablet Take 1 tablet (500 mg total) by mouth 2 (two) times daily. 10/24/15  Yes [name], MD  meclizine (ANTIVERT) 25 MG tablet Take 1 tablet (25 mg total) by mouth 3 (three) times daily as needed for dizziness. 03/22/16  Yes [name], MD  metolazone (ZAROXOLYN) 2.5 MG tablet Take 2.5 mg by mouth every Thursday.   Yes Historical Provider, MD  nitroGLYCERIN (NITROGLYN) 2 % ointment Apply 0.5 inches topically as needed for chest pain. Reported on 02/20/2016   Yes Historical Provider, MD  omeprazole (PRILOSEC) 20 MG capsule Take 1 capsule (20 mg total) by mouth daily. 03/15/15  Yes [name], PA-C  potassium chloride SA (K-DUR,KLOR-CON) 20 MEQ tablet Take 1 tablet (20 mEq total) by mouth 2 (two) times daily. 03/22/16  Yes [name], MD  psyllium (METAMUCIL) 58.6 % packet Take 1 packet by mouth  daily.   Yes Historical Provider, MD  spironolactone (ALDACTONE) 25 MG tablet Take 0.5 tablets (12.5 mg total) by mouth 2 (two) times daily. 03/22/16  Yes [name], MD  losartan (COZAAR) 50 MG tablet Take 1 tablet (50 mg total) by mouth daily. Patient not taking: Reported on 04/11/2016 03/28/16   Doreene Burke Kilroy, PA-C   BP 106/75 mmHg  Pulse 79  Temp(Src) 97.9 F (36.6 C) (Oral)  Resp 12  SpO2 100% Physical Exam  Constitutional: He appears well-developed and well-nourished. No distress.  HENT:  Head: Normocephalic and atraumatic.  Mouth/Throat: Oropharynx is clear and moist.  Eyes: Conjunctivae and EOM are normal. Pupils are equal, round, and reactive to light.  Neck: Normal range of motion. Neck supple.  Cardiovascular: Normal  rate and regular rhythm.   Pulmonary/Chest: Effort normal and breath sounds normal. No respiratory distress.  Abdominal: Soft. Bowel sounds are normal. There is no tenderness.  Neurological: He is alert. No cranial nerve deficit.  Was some confusion. Patient able to move all 4 extremities. No obvious neuro focal deficit. Questionable slight weakness to the left arm.  Nursing note and vitals reviewed.   ED Course  Procedures (including critical care time) Labs Review Labs Reviewed  CBC WITH DIFFERENTIAL/PLATELET - Abnormal; Notable for the following:    WBC 11.1 (*)    MCH 34.3 (*)    MCHC 36.9 (*)    Platelets 136 (*)    Monocytes Absolute 1.4 (*)    All other components within normal limits  BASIC METABOLIC PANEL - Abnormal; Notable for the following:    Chloride 94 (*)    CO2 33 (*)    Glucose, Bld 134 (*)    BUN 52 (*)    Creatinine, Ser 1.85 (*)    GFR calc non Af Amer 36 (*)    GFR calc Af Amer 41 (*)    All other components within normal limits  URINALYSIS, ROUTINE W REFLEX MICROSCOPIC (NOT AT [facility]) - Abnormal; Notable for the following:    Leukocytes, UA TRACE (*)    All other components within normal limits  HEPATIC FUNCTION PANEL -  Abnormal; Notable for the following:    Total Protein 8.2 (*)    Total Bilirubin 1.8 (*)    Indirect Bilirubin 1.4 (*)    All other components within normal limits  URINE MICROSCOPIC-ADD ON - Abnormal; Notable for the following:    Squamous Epithelial / LPF 0-5 (*)    Bacteria, UA RARE (*)    Casts HYALINE CASTS (*)    All other components within normal limits  LIPASE, BLOOD   Results for orders placed or performed during the hospital encounter of 04/11/16  CBC with Differential  Result Value Ref Range   WBC 11.1 (H) 4.0 - 10.5 K/uL   RBC 4.32 4.22 - 5.81 MIL/uL   Hemoglobin 14.8 13.0 - 17.0 g/dL   HCT 40.1 39.0 - 52.0 %   MCV 92.8 78.0 - 100.0 fL   MCH 34.3 (H) 26.0 - 34.0 pg   MCHC 36.9 (H) 30.0 - 36.0 g/dL   RDW 13.0 11.5 - 15.5 %   Platelets 136 (L) 150 - 400 K/uL   Neutrophils Relative % 68 %   Neutro Abs 7.5 1.7 - 7.7 K/uL   Lymphocytes Relative 17 %   Lymphs Abs 1.9 0.7 - 4.0 K/uL   Monocytes Relative 12 %   Monocytes Absolute 1.4 (H) 0.1 - 1.0 K/uL   Eosinophils Relative 3 %   Eosinophils Absolute 0.3 0.0 - 0.7 K/uL   Basophils Relative 0 %   Basophils Absolute 0.0 0.0 - 0.1 K/uL  Basic metabolic panel  Result Value Ref Range   Sodium 137 135 - 145 mmol/L   Potassium 3.6 3.5 - 5.1 mmol/L   Chloride 94 (L) 101 - 111 mmol/L   CO2 33 (H) 22 - 32 mmol/L   Glucose, Bld 134 (H) 65 - 99 mg/dL   BUN 52 (H) 6 - 20 mg/dL   Creatinine, Ser 1.85 (H) 0.61 - 1.24 mg/dL   Calcium 9.2 8.9 - 10.3 mg/dL   GFR calc non Af Amer 36 (L) >60 mL/min   GFR calc Af Amer 41 (L) >60 mL/min   Anion gap 10 5 -  15  Urinalysis, Routine w reflex microscopic (not at [facility])  Result Value Ref Range   Color, Urine YELLOW YELLOW   APPearance CLEAR CLEAR   Specific Gravity, Urine 1.010 1.005 - 1.030   pH 5.5 5.0 - 8.0   Glucose, UA NEGATIVE NEGATIVE mg/dL   Hgb urine dipstick NEGATIVE NEGATIVE   Bilirubin Urine NEGATIVE NEGATIVE   Ketones, ur NEGATIVE NEGATIVE mg/dL   Protein, ur NEGATIVE  NEGATIVE mg/dL   Nitrite NEGATIVE NEGATIVE   Leukocytes, UA TRACE (A) NEGATIVE  Hepatic function panel  Result Value Ref Range   Total Protein 8.2 (H) 6.5 - 8.1 g/dL   Albumin 3.7 3.5 - 5.0 g/dL   AST 36 15 - 41 U/L   ALT 21 17 - 63 U/L   Alkaline Phosphatase 99 38 - 126 U/L   Total Bilirubin 1.8 (H) 0.3 - 1.2 mg/dL   Bilirubin, Direct 0.4 0.1 - 0.5 mg/dL   Indirect Bilirubin 1.4 (H) 0.3 - 0.9 mg/dL  Lipase, blood  Result Value Ref Range   Lipase 42 11 - 51 U/L  Urine microscopic-add on  Result Value Ref Range   Squamous Epithelial / LPF 0-5 (A) NONE SEEN   WBC, UA 0-5 0 - 5 WBC/hpf   RBC / HPF 0-5 0 - 5 RBC/hpf   Bacteria, UA RARE (A) NONE SEEN   Casts HYALINE CASTS (A) NEGATIVE     Imaging Review Dg Chest 2 View  04/11/2016  CLINICAL DATA:  Altered mental status/confusion EXAM: CHEST  2 VIEW COMPARISON:  March 15, 2016 FINDINGS: Port-A-Cath tip is at the junction of the left innominate vein and superior vena cava. No pneumothorax. There is no edema or consolidation. Heart size and pulmonary vascularity are normal. No adenopathy. There is degenerative change in the lower thoracic and visualized lumbar regions. There is slight anterior wedging of the L1 vertebral body, stable. IMPRESSION: No edema or consolidation. Stable cardiac silhouette. Port-A-Cath tip at the junction of the left innominate vein and superior vena cava. No pneumothorax. Electronically Signed   By: Lowella Grip III M.D.   On: 04/11/2016 17:16   Ct Head Wo Contrast  04/11/2016  CLINICAL DATA:  Weakness, altered mental status, and confusion today, history colon cancer, skin cancer, hypertension, multiple falls in past month EXAM: CT HEAD WITHOUT CONTRAST TECHNIQUE: Contiguous axial images were obtained from the base of the skull through the vertex without intravenous contrast. COMPARISON:  10/19/2015 FINDINGS: Generalized atrophy. Normal ventricular morphology. No midline shift or mass effect. Otherwise normal  appearance of brain parenchyma. No intracranial hemorrhage, mass lesion or evidence acute infarction. No extra-axial fluid collections. Mucosal retention cyst LEFT maxillary sinus. Minimal fluid in a few RIGHT mastoid air cells. No acute bone or sinus abnormalities otherwise identified. IMPRESSION: Generalized atrophy. No acute intracranial abnormalities. Electronically Signed   By: Lavonia Dana M.D.   On: 04/11/2016 17:48   Mr Brain Wo Contrast  04/11/2016  CLINICAL DATA:  70 year old male with weakness, confusion, multiple recent falls. Initial encounter. EXAM: MRI HEAD WITHOUT CONTRAST TECHNIQUE: Multiplanar, multiecho pulse sequences of the brain and surrounding structures were obtained without intravenous contrast. COMPARISON:  Head CT 1739 hours today.  Brain MRI 10/20/2015. FINDINGS: Major intracranial vascular flow voids are stable, with chronic abnormal appearance of the bilateral distal vertebral arteries, although as before there appears to be reconstituted flow in the basilar artery. There is a subtle punctate area of restricted diffusion in the right paracentral lower midbrain seen on both series 100,  image 15 and series 101, image 28. No associated T2 or FLAIR hyperintensity. No associated hemorrhage or mass effect. No restricted diffusion elsewhere. No midline shift, mass effect, evidence of mass lesion, ventriculomegaly, extra-axial collection or acute intracranial hemorrhage. Cervicomedullary junction and pituitary are within normal limits. Negative visualized cervical spine. Stable gray and white matter signal, largely unremarkable for age throughout the brain. No cortical encephalomalacia or chronic cerebral blood products. Visible internal auditory structures appear normal. Stable mild mastoid effusions. New mucosal thickening or mucous retention cyst in the left maxillary sinus. Stable mild ethmoid mucosal thickening. Orbit and scalp soft tissues are negative. Visualized bone marrow signal is  within normal limits. IMPRESSION: 1. Suggestion of a tiny acute lacunar infarct in the right midbrain. 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 2. No associated hemorrhage or mass effect. No other acute intracranial abnormality. Electronically Signed   By: Genevie Ann M.D.   On: 04/11/2016 19:11   I have personally reviewed and evaluated these images and lab results as part of my medical decision-making.   EKG Interpretation   Date/Time:  Thursday April 11 2016 16:27:15 EDT Ventricular Rate:  77 PR Interval:    QRS Duration: 118 QT Interval:  394 QTC Calculation: 446 R Axis:   82 Text Interpretation:  Atrial fibrillation Nonspecific intraventricular  conduction delay Borderline low voltage, extremity leads No significant  change since last tracing Confirmed by Jrue Yambao  MD, Danford Tat [phone]) on  04/11/2016 5:21:53 PM      MDM   Final diagnoses:  Cerebral infarction due to unspecified mechanism   Discussed with neurology, Dr. Nicole Kindred, at [place]. He concurs that the infarct is acute probably not related to his symptoms at all. And that patient could be worked up here does not require transfer to [place].  As well as will admit here and they will evaluate for the confusion that the family is concerned about. Although not explained by the infarct. Patient's urinalysis negative for urinary tract infection chest x-rays negative for evidence of pneumonia or pneumothorax or pulmonary edema. Patient does have a slight leukocytosis otherwise labs without significant abnormalities.   Patient does have risk factors for the stroke he does have atrial fib he is on Prodaxa.  Fredia Sorrow, MD 04/11/16 2016

## 2016-04-12 ENCOUNTER — Observation Stay (HOSPITAL_COMMUNITY): Payer: Medicare Other

## 2016-04-12 DIAGNOSIS — Z801 Family history of malignant neoplasm of trachea, bronchus and lung: Secondary | ICD-10-CM | POA: Diagnosis not present

## 2016-04-12 DIAGNOSIS — R32 Unspecified urinary incontinence: Secondary | ICD-10-CM | POA: Diagnosis present

## 2016-04-12 DIAGNOSIS — I6350 Cerebral infarction due to unspecified occlusion or stenosis of unspecified cerebral artery: Secondary | ICD-10-CM | POA: Diagnosis not present

## 2016-04-12 DIAGNOSIS — K7460 Unspecified cirrhosis of liver: Secondary | ICD-10-CM | POA: Diagnosis present

## 2016-04-12 DIAGNOSIS — M1990 Unspecified osteoarthritis, unspecified site: Secondary | ICD-10-CM | POA: Diagnosis present

## 2016-04-12 DIAGNOSIS — K219 Gastro-esophageal reflux disease without esophagitis: Secondary | ICD-10-CM | POA: Diagnosis present

## 2016-04-12 DIAGNOSIS — K625 Hemorrhage of anus and rectum: Secondary | ICD-10-CM | POA: Diagnosis not present

## 2016-04-12 DIAGNOSIS — I482 Chronic atrial fibrillation: Secondary | ICD-10-CM | POA: Diagnosis present

## 2016-04-12 DIAGNOSIS — I639 Cerebral infarction, unspecified: Secondary | ICD-10-CM | POA: Diagnosis present

## 2016-04-12 DIAGNOSIS — G20 Parkinson's disease: Secondary | ICD-10-CM | POA: Diagnosis present

## 2016-04-12 DIAGNOSIS — Z7901 Long term (current) use of anticoagulants: Secondary | ICD-10-CM | POA: Diagnosis not present

## 2016-04-12 DIAGNOSIS — Z9049 Acquired absence of other specified parts of digestive tract: Secondary | ICD-10-CM | POA: Diagnosis not present

## 2016-04-12 DIAGNOSIS — Z9221 Personal history of antineoplastic chemotherapy: Secondary | ICD-10-CM | POA: Diagnosis not present

## 2016-04-12 DIAGNOSIS — C189 Malignant neoplasm of colon, unspecified: Secondary | ICD-10-CM | POA: Diagnosis not present

## 2016-04-12 DIAGNOSIS — G40209 Localization-related (focal) (partial) symptomatic epilepsy and epileptic syndromes with complex partial seizures, not intractable, without status epilepticus: Secondary | ICD-10-CM | POA: Diagnosis present

## 2016-04-12 DIAGNOSIS — Z86718 Personal history of other venous thrombosis and embolism: Secondary | ICD-10-CM | POA: Diagnosis not present

## 2016-04-12 DIAGNOSIS — I509 Heart failure, unspecified: Secondary | ICD-10-CM | POA: Diagnosis present

## 2016-04-12 DIAGNOSIS — Z66 Do not resuscitate: Secondary | ICD-10-CM | POA: Diagnosis present

## 2016-04-12 DIAGNOSIS — G4730 Sleep apnea, unspecified: Secondary | ICD-10-CM | POA: Diagnosis present

## 2016-04-12 DIAGNOSIS — J449 Chronic obstructive pulmonary disease, unspecified: Secondary | ICD-10-CM | POA: Diagnosis present

## 2016-04-12 DIAGNOSIS — I110 Hypertensive heart disease with heart failure: Secondary | ICD-10-CM | POA: Diagnosis present

## 2016-04-12 DIAGNOSIS — R4182 Altered mental status, unspecified: Secondary | ICD-10-CM | POA: Diagnosis present

## 2016-04-12 DIAGNOSIS — Z833 Family history of diabetes mellitus: Secondary | ICD-10-CM | POA: Diagnosis not present

## 2016-04-12 DIAGNOSIS — Z85038 Personal history of other malignant neoplasm of large intestine: Secondary | ICD-10-CM | POA: Diagnosis not present

## 2016-04-12 DIAGNOSIS — Z85828 Personal history of other malignant neoplasm of skin: Secondary | ICD-10-CM | POA: Diagnosis not present

## 2016-04-12 DIAGNOSIS — N179 Acute kidney failure, unspecified: Secondary | ICD-10-CM | POA: Diagnosis present

## 2016-04-12 DIAGNOSIS — Z800 Family history of malignant neoplasm of digestive organs: Secondary | ICD-10-CM | POA: Diagnosis not present

## 2016-04-12 DIAGNOSIS — Z8249 Family history of ischemic heart disease and other diseases of the circulatory system: Secondary | ICD-10-CM | POA: Diagnosis not present

## 2016-04-12 DIAGNOSIS — Z6837 Body mass index (BMI) 37.0-37.9, adult: Secondary | ICD-10-CM | POA: Diagnosis not present

## 2016-04-12 DIAGNOSIS — Z803 Family history of malignant neoplasm of breast: Secondary | ICD-10-CM | POA: Diagnosis not present

## 2016-04-12 LAB — LIPID PANEL
Cholesterol: 110 mg/dL (ref 0–200)
HDL: 46 mg/dL (ref >40)
LDL Cholesterol: 53 mg/dL (ref 0–99)
TRIGLYCERIDES: 53 mg/dL (ref <150)
Total CHOL/HDL Ratio: 2.4 RATIO
VLDL: 11 mg/dL (ref 0–40)

## 2016-04-12 LAB — ECHOCARDIOGRAM COMPLETE
Height: 67 in
Weight: 3825.6 oz

## 2016-04-12 NOTE — Evaluation (Signed)
Physical Therapy Evaluation Patient Details Name: [name] MRN: [id number] DOB: [birth year] Today's Date: 04/12/2016   History of Present Illness  70 year old male who  has a past medical history of Varicose veins; New onset atrial fibrillation ([place]) (01/02/2015); Morbid obesity ([place]) (01/02/2015); Hypertension; Sleep apnea; GERD (gastroesophageal reflux disease); History of gout; Cancer ([place]) (02/2015); Family history of colon cancer; Family history of breast cancer in mother; Colon cancer ([place]); and Parkinson disease ([place]).  Today presents to the hospital with intermittent episodes of confusion for past 2 days. As per patient's wife patient has been having these episodes since October last year, his last episode of confusion was a month ago. Wife describes this as weird behavior as patient would "start urinating on the floor, putting socks over his shoes". But these episodes last only for a few hours and patient is back to his normal self. At this time patient is not confused. Workup in the ED showed tiny acute lacunar infarct in the right mid brain.  Clinical Impression  Pt received in bed, wife present, and pt is agreeable to PT evaluation.  Wife expressed that just prior to pt's admission, he was listing to the left during ambulation.  During today's evaluation he demonstrated bed level mobility at Mod (I), transfer sit<>stand with supervision with cues for hand placement, and gait x33f with RW and CGA/supervision.  Pt and wife both agree that this is his baseline level of mobility.  Therefore, pt does not demonstrate need for continued skilled acute PT at this time, however, it is recommended that he resume his OPPT at d/c. No DME needs at this time.     Follow Up Recommendations Outpatient PT (resume)    Equipment Recommendations  None recommended by PT    Recommendations for Other Services       Precautions / Restrictions Precautions Precautions: Fall Restrictions Weight Bearing  Restrictions: No      Mobility  Bed Mobility Overal bed mobility: Modified Independent                Transfers Overall transfer level: Needs assistance   Transfers: Sit to/from Stand Sit to Stand: Supervision;Min guard         General transfer comment: Pt required vc's for hand placement for transfers  Ambulation/Gait Ambulation/Gait assistance: Modified independent (Device/Increase time) Ambulation Distance (Feet): 80 Feet Assistive device: Rolling walker (2 wheeled) Gait Pattern/deviations: Step-through pattern;Trunk flexed     General Gait Details: decreased cadence.  Pt and wife agree that his gait has returned to baseline at this time.  Wife reports that just prior to him coming in, he was listing to the left.  That is not noted during today's evaluation.   Stairs            Wheelchair Mobility    Modified Rankin (Stroke Patients Only)       Balance Overall balance assessment: Modified Independent                                           Pertinent Vitals/Pain Pain Assessment: No/denies pain    Home Living Family/patient expects to be discharged to:: Private residence Living Arrangements: Spouse/significant other Available Help at Discharge: Family;Available 24 hours/day Type of Home: House Home Access: Ramped entrance     Home Layout: One level Home Equipment: Walker - 2 wheels;Bedside commode;Cane - single point;Wheelchair - manual  Prior Function Level of Independence: Needs assistance   Gait / Transfers Assistance Needed: Pt ambulates with a RW.  Wife reports that she has to assist him with sit<>stand transfers nearly every time except if the chair has 2 arm rests.  Pt states that most of the time he can walk from one end of the house to the other without rest break.   ADL's / Homemaking Assistance Needed: some assist with bathing/dressing        Hand Dominance        Extremity/Trunk Assessment   Upper  Extremity Assessment: Overall WFL for tasks assessed           Lower Extremity Assessment: Overall WFL for tasks assessed         Communication   Communication: No difficulties  Cognition Arousal/Alertness: Awake/alert Behavior During Therapy: WFL for tasks assessed/performed Overall Cognitive Status: Within Functional Limits for tasks assessed                      General Comments      Exercises        Assessment/Plan    PT Assessment All further PT needs can be met in the next venue of care  PT Diagnosis Difficulty walking   PT Problem List    PT Treatment Interventions     PT Goals (Current goals can be found in the Care Plan section) Acute Rehab PT Goals Patient Stated Goal: Pt wants to go home. PT Goal Formulation: With patient/family Time For Goal Achievement: 04/12/16 Potential to Achieve Goals: Good    Frequency     Barriers to discharge        Co-evaluation               End of Session Equipment Utilized During Treatment: Gait belt Activity Tolerance: Patient tolerated treatment well Patient left: in chair;with family/visitor present;with call bell/phone within reach Nurse Communication: Mobility status (RN notified that PT instructed pt and wife that the pt could ambulate in the hallway with her supervision and RW.)    Functional Assessment Tool Used: Clinical Judgement Functional Limitation: Mobility: Walking and moving around Mobility: Walking and Moving Around Current Status (K9326): At least 20 percent but less than 40 percent impaired, limited or restricted Mobility: Walking and Moving Around Goal Status [phone]): At least 20 percent but less than 40 percent impaired, limited or restricted Mobility: Walking and Moving Around Discharge Status [phone]): At least 20 percent but less than 40 percent impaired, limited or restricted    Time: 3382-5053 PT Time Calculation (min) (ACUTE ONLY): 34 min   Charges:   PT Evaluation $PT  Eval Low Complexity: 1 Procedure PT Treatments $Gait Training: 8-22 mins   PT G Codes:   PT G-Codes **NOT FOR INPATIENT CLASS** Functional Assessment Tool Used: Clinical Judgement Functional Limitation: Mobility: Walking and moving around Mobility: Walking and Moving Around Current Status (Z7673): At least 20 percent but less than 40 percent impaired, limited or restricted Mobility: Walking and Moving Around Goal Status [phone]): At least 20 percent but less than 40 percent impaired, limited or restricted Mobility: Walking and Moving Around Discharge Status [phone]): At least 20 percent but less than 40 percent impaired, limited or restricted    Tacy Learn, PT, DPT X: [id number]  04/12/2016, 10:58 AM

## 2016-04-12 NOTE — Progress Notes (Signed)
OT Cancellation Note  Patient Details Name: [name] MRN: [id number] DOB: [birth year]   Cancelled Treatment:     Per physical therapy, patient is at baseline.  Patient does not required skilled OT assessment this date.  Patient is scheduled for Outpatient OT evaluation on 04/16/16.  Vangie Bicker, OTR/L [phone]  04/12/2016, 11:11 AM

## 2016-04-12 NOTE — Care Management Note (Signed)
Case Management Note  Patient Details  Name: BRECKIN SAVANNAH MRN: [id number] Date of Birth: 07-Jun-1946  Subjective/Objective:  Pt is alert and oriented from home with wife and is indepenednt. Pt has walker to use with ambulation and receives PT at the [facility] in [place].No difficulty with medications  continue outpatient therapies.                Action/Plan: Home with self and family care.   Expected Discharge Date:                  Expected Discharge Plan:  Home/Self Care  In-House Referral:     Discharge [place] not met per provider  Post Acute Care Choice:  Resumption of Svcs/PTA Provider Choice offered to:     DME Arranged:    DME Agency:     HH Arranged:    [place] Agency:     Status of Service:  Completed, signed off  Medicare Important Message Given:    Date Medicare IM Given:    Medicare IM give by:    Date Additional Medicare IM Given:    Additional Medicare Important Message give by:     If discussed at [place] of Stay Meetings, dates discussed:    Additional Comments:  Alvie Heidelberg, RN 04/12/2016, 12:55 PM

## 2016-04-12 NOTE — Care Management Obs Status (Signed)
[place] NOTIFICATION   Patient Details  Name: [name] MRN: [id number] Date of Birth: 08/30/1946   Medicare Observation Status Notification Given:  Yes    Alvie Heidelberg, RN 04/12/2016, 12:57 PM

## 2016-04-12 NOTE — Progress Notes (Signed)
Subjective: Patient was admitted due to episode of confuse. His MRI showed tiny acute lacunar infarct. Patient feels better this morning. He is more alertcand awake.  Objective: Vital signs in last 24 hours: Temp:  [97.9 F (36.6 C)-98.2 F (36.8 C)] 98 F (36.7 C) (04/14 0800) Pulse Rate:  [60-82] 60 (04/14 0800) Resp:  [12-20] 16 (04/14 0800) BP: (103-125)/(59-79) 112/73 mmHg (04/14 0800) SpO2:  [97 %-100 %] 97 % (04/14 0800) Weight:  [108.455 kg (239 lb 1.6 oz)] 108.455 kg (239 lb 1.6 oz) (04/13 2238) Weight change:  Last BM Date: 04/10/16  Intake/Output from previous day: 04/13 0701 - 04/14 0700 In: 1222.1 [I.V.:1222.1] Out: 300 [Urine:300]  PHYSICAL EXAM General appearance: alert and no distress Resp: clear to auscultation bilaterally Cardio: S1, S2 normal GI: soft, non-tender; bowel sounds normal; no masses,  no organomegaly Extremities: extremities normal, atraumatic, no cyanosis or edema  Lab Results:  Results for orders placed or performed during the hospital encounter of 04/11/16 (from the past 48 hour(s))  CBC with Differential     Status: Abnormal   Collection Time: 04/11/16  4:37 PM  Result Value Ref Range   WBC 11.1 (H) 4.0 - 10.5 K/uL   RBC 4.32 4.22 - 5.81 MIL/uL   Hemoglobin 14.8 13.0 - 17.0 g/dL   HCT 40.1 39.0 - 52.0 %   MCV 92.8 78.0 - 100.0 fL   MCH 34.3 (H) 26.0 - 34.0 pg   MCHC 36.9 (H) 30.0 - 36.0 g/dL   RDW 13.0 11.5 - 15.5 %   Platelets 136 (L) 150 - 400 K/uL   Neutrophils Relative % 68 %   Neutro Abs 7.5 1.7 - 7.7 K/uL   Lymphocytes Relative 17 %   Lymphs Abs 1.9 0.7 - 4.0 K/uL   Monocytes Relative 12 %   Monocytes Absolute 1.4 (H) 0.1 - 1.0 K/uL   Eosinophils Relative 3 %   Eosinophils Absolute 0.3 0.0 - 0.7 K/uL   Basophils Relative 0 %   Basophils Absolute 0.0 0.0 - 0.1 K/uL  Basic metabolic panel     Status: Abnormal   Collection Time: 04/11/16  4:37 PM  Result Value Ref Range   Sodium 137 135 - 145 mmol/L   Potassium 3.6 3.5 -  5.1 mmol/L   Chloride 94 (L) 101 - 111 mmol/L   CO2 33 (H) 22 - 32 mmol/L   Glucose, Bld 134 (H) 65 - 99 mg/dL   BUN 52 (H) 6 - 20 mg/dL   Creatinine, Ser 1.85 (H) 0.61 - 1.24 mg/dL   Calcium 9.2 8.9 - 10.3 mg/dL   GFR calc non Af Amer 36 (L) >60 mL/min   GFR calc Af Amer 41 (L) >60 mL/min    Comment: (NOTE) The eGFR has been calculated using the CKD EPI equation. This calculation has not been validated in all clinical situations. eGFR's persistently <60 mL/min signify possible Chronic Kidney Disease.    Anion gap 10 5 - 15  Hepatic function panel     Status: Abnormal   Collection Time: 04/11/16  4:37 PM  Result Value Ref Range   Total Protein 8.2 (H) 6.5 - 8.1 g/dL   Albumin 3.7 3.5 - 5.0 g/dL   AST 36 15 - 41 U/L   ALT 21 17 - 63 U/L   Alkaline Phosphatase 99 38 - 126 U/L   Total Bilirubin 1.8 (H) 0.3 - 1.2 mg/dL   Bilirubin, Direct 0.4 0.1 - 0.5 mg/dL   Indirect Bilirubin 1.4 (H)  0.3 - 0.9 mg/dL  Lipase, blood     Status: None   Collection Time: 04/11/16  4:37 PM  Result Value Ref Range   Lipase 42 11 - 51 U/L  Urinalysis, Routine w reflex microscopic (not at [facility])     Status: Abnormal   Collection Time: 04/11/16  6:57 PM  Result Value Ref Range   Color, Urine YELLOW YELLOW   APPearance CLEAR CLEAR   Specific Gravity, Urine 1.010 1.005 - 1.030   pH 5.5 5.0 - 8.0   Glucose, UA NEGATIVE NEGATIVE mg/dL   Hgb urine dipstick NEGATIVE NEGATIVE   Bilirubin Urine NEGATIVE NEGATIVE   Ketones, ur NEGATIVE NEGATIVE mg/dL   Protein, ur NEGATIVE NEGATIVE mg/dL   Nitrite NEGATIVE NEGATIVE   Leukocytes, UA TRACE (A) NEGATIVE  Urine microscopic-add on     Status: Abnormal   Collection Time: 04/11/16  6:57 PM  Result Value Ref Range   Squamous Epithelial / LPF 0-5 (A) NONE SEEN   WBC, UA 0-5 0 - 5 WBC/hpf   RBC / HPF 0-5 0 - 5 RBC/hpf   Bacteria, UA RARE (A) NONE SEEN   Casts HYALINE CASTS (A) NEGATIVE  Lipid panel     Status: None   Collection Time: 04/12/16  5:50 AM  Result  Value Ref Range   Cholesterol 110 0 - 200 mg/dL   Triglycerides 53 <150 mg/dL   HDL 46 >40 mg/dL   Total CHOL/HDL Ratio 2.4 RATIO   VLDL 11 0 - 40 mg/dL   LDL Cholesterol 53 0 - 99 mg/dL    Comment:        Total Cholesterol/HDL:CHD Risk Coronary Heart Disease Risk Table                     Men   Women  1/2 Average Risk   3.4   3.3  Average Risk       5.0   4.4  2 X Average Risk   9.6   7.1  3 X Average Risk  23.4   11.0        Use the calculated Patient Ratio above and the CHD Risk Table to determine the patient's CHD Risk.        ATP III CLASSIFICATION (LDL):  <100     mg/dL   Optimal  100-129  mg/dL   Near or Above                    Optimal  130-159  mg/dL   Borderline  160-189  mg/dL   High  >190     mg/dL   Very High     ABGS No results for input(s): PHART, PO2ART, TCO2, HCO3 in the last 72 hours.  Invalid input(s): PCO2 CULTURES No results found for this or any previous visit (from the past 240 hour(s)). Studies/Results: Dg Chest 2 View  04/11/2016  CLINICAL DATA:  Altered mental status/confusion EXAM: CHEST  2 VIEW COMPARISON:  March 15, 2016 FINDINGS: Port-A-Cath tip is at the junction of the left innominate vein and superior vena cava. No pneumothorax. There is no edema or consolidation. Heart size and pulmonary vascularity are normal. No adenopathy. There is degenerative change in the lower thoracic and visualized lumbar regions. There is slight anterior wedging of the L1 vertebral body, stable. IMPRESSION: No edema or consolidation. Stable cardiac silhouette. Port-A-Cath tip at the junction of the left innominate vein and superior vena cava. No pneumothorax. Electronically Signed  By: Lowella Grip III M.D.   On: 04/11/2016 17:16   Ct Head Wo Contrast  04/11/2016  CLINICAL DATA:  Weakness, altered mental status, and confusion today, history colon cancer, skin cancer, hypertension, multiple falls in past month EXAM: CT HEAD WITHOUT CONTRAST TECHNIQUE:  Contiguous axial images were obtained from the base of the skull through the vertex without intravenous contrast. COMPARISON:  10/19/2015 FINDINGS: Generalized atrophy. Normal ventricular morphology. No midline shift or mass effect. Otherwise normal appearance of brain parenchyma. No intracranial hemorrhage, mass lesion or evidence acute infarction. No extra-axial fluid collections. Mucosal retention cyst LEFT maxillary sinus. Minimal fluid in a few RIGHT mastoid air cells. No acute bone or sinus abnormalities otherwise identified. IMPRESSION: Generalized atrophy. No acute intracranial abnormalities. Electronically Signed   By: Lavonia Dana M.D.   On: 04/11/2016 17:48   Mr Brain Wo Contrast  04/11/2016  CLINICAL DATA:  70 year old male with weakness, confusion, multiple recent falls. Initial encounter. EXAM: MRI HEAD WITHOUT CONTRAST TECHNIQUE: Multiplanar, multiecho pulse sequences of the brain and surrounding structures were obtained without intravenous contrast. COMPARISON:  Head CT 1739 hours today.  Brain MRI 10/20/2015. FINDINGS: Major intracranial vascular flow voids are stable, with chronic abnormal appearance of the bilateral distal vertebral arteries, although as before there appears to be reconstituted flow in the basilar artery. There is a subtle punctate area of restricted diffusion in the right paracentral lower midbrain seen on both series 100, image 15 and series 101, image 28. No associated T2 or FLAIR hyperintensity. No associated hemorrhage or mass effect. No restricted diffusion elsewhere. No midline shift, mass effect, evidence of mass lesion, ventriculomegaly, extra-axial collection or acute intracranial hemorrhage. Cervicomedullary junction and pituitary are within normal limits. Negative visualized cervical spine. Stable gray and white matter signal, largely unremarkable for age throughout the brain. No cortical encephalomalacia or chronic cerebral blood products. Visible internal auditory  structures appear normal. Stable mild mastoid effusions. New mucosal thickening or mucous retention cyst in the left maxillary sinus. Stable mild ethmoid mucosal thickening. Orbit and scalp soft tissues are negative. Visualized bone marrow signal is within normal limits. IMPRESSION: 1. Suggestion of a tiny acute lacunar infarct in the right midbrain. 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 2. No associated hemorrhage or mass effect. No other acute intracranial abnormality. Electronically Signed   By: Genevie Ann M.D.   On: 04/11/2016 19:11   Mr Jodene Nam Head/brain Wo Cm  04/11/2016  CLINICAL DATA:  Followup infarct. Air history of hypertension, Parkinson's disease and colon cancer. EXAM: MRA HEAD WITHOUT CONTRAST TECHNIQUE: Angiographic images of the Circle of Willis were obtained using MRA technique without intravenous contrast. COMPARISON:  MRI of the brain May 11, 2016 at 1832 hours FINDINGS: Anterior circulation: Normal flow related enhancement of the included cervical, petrous, cavernous and supraclinoid internal carotid arteries. Patent anterior communicating artery. Normal flow related enhancement of the anterior and middle cerebral arteries, limited evaluation of mid to distal segments on mid images due to technique though, normal on raw data. No large vessel occlusion, high-grade stenosis, abnormal luminal irregularity, aneurysm. Posterior circulation: Codominant vertebral arteries. Basilar artery is patent, with normal flow related enhancement of the main branch vessels. Normal flow related enhancement of the posterior cerebral arteries. Fetal origin bilateral posterior cerebral arteries. Limited evaluation of mid to distal segments on mid images due to technique though, normal on raw data. No large vessel occlusion, high-grade stenosis, abnormal luminal irregularity, aneurysm. IMPRESSION: Negative MRA head. Electronically Signed   By: Sandie Ano  Bloomer M.D.   On: 04/11/2016 22:23     Medications: I have reviewed the patient's current medications.  Assesment:   Active Problems:   Colon carcinoma (HCC)   CVA (cerebral infarction)   Stroke [facility])   Confusion acute renal injury   Plan:  Medications reviewed Will do neurology consult Continue iv hydration Will monitor CBC/BMP      Lynard Postlewait 04/12/2016, 8:13 AM

## 2016-04-13 DIAGNOSIS — I639 Cerebral infarction, unspecified: Principal | ICD-10-CM

## 2016-04-13 DIAGNOSIS — C189 Malignant neoplasm of colon, unspecified: Secondary | ICD-10-CM

## 2016-04-13 LAB — CBC
HEMATOCRIT: 32.4 % — AB (ref 39.0–52.0)
Hemoglobin: 11.6 g/dL — ABNORMAL LOW (ref 13.0–17.0)
MCH: 33.4 pg (ref 26.0–34.0)
MCHC: 35.8 g/dL (ref 30.0–36.0)
MCV: 93.4 fL (ref 78.0–100.0)
PLATELETS: 100 10*3/uL — AB (ref 150–400)
RBC: 3.47 MIL/uL — AB (ref 4.22–5.81)
RDW: 13.2 % (ref 11.5–15.5)
WBC: 7.1 10*3/uL (ref 4.0–10.5)

## 2016-04-13 LAB — BASIC METABOLIC PANEL
ANION GAP: 9 (ref 5–15)
BUN: 39 mg/dL — AB (ref 6–20)
CO2: 30 mmol/L (ref 22–32)
Calcium: 8.6 mg/dL — ABNORMAL LOW (ref 8.9–10.3)
Chloride: 100 mmol/L — ABNORMAL LOW (ref 101–111)
Creatinine, Ser: 1.43 mg/dL — ABNORMAL HIGH (ref 0.61–1.24)
GFR calc Af Amer: 56 mL/min — ABNORMAL LOW (ref >60)
GFR, EST NON AFRICAN AMERICAN: 48 mL/min — AB (ref >60)
GLUCOSE: 103 mg/dL — AB (ref 65–99)
POTASSIUM: 3.3 mmol/L — AB (ref 3.5–5.1)
Sodium: 139 mmol/L (ref 135–145)

## 2016-04-13 LAB — HEMOGLOBIN A1C
HEMOGLOBIN A1C: 5.4 % (ref 4.8–5.6)
MEAN PLASMA GLUCOSE: 108 mg/dL

## 2016-04-13 MED ORDER — POTASSIUM CHLORIDE CRYS ER 20 MEQ PO TBCR
40.0000 meq | EXTENDED_RELEASE_TABLET | Freq: Two times a day (BID) | ORAL | Status: AC
  Administered 2016-04-13 (×2): 40 meq via ORAL
  Filled 2016-04-13 (×2): qty 2

## 2016-04-13 MED ORDER — HYDROCORTISONE ACETATE 25 MG RE SUPP
25.0000 mg | Freq: Two times a day (BID) | RECTAL | Status: DC
  Administered 2016-04-13 – 2016-04-16 (×7): 25 mg via RECTAL
  Filled 2016-04-13 (×7): qty 1

## 2016-04-13 MED ORDER — SENNOSIDES-DOCUSATE SODIUM 8.6-50 MG PO TABS
2.0000 | ORAL_TABLET | Freq: Every day | ORAL | Status: DC
  Administered 2016-04-13 – 2016-04-16 (×4): 2 via ORAL
  Filled 2016-04-13 (×4): qty 2

## 2016-04-13 MED ORDER — CALCIUM POLYCARBOPHIL 625 MG PO TABS
1250.0000 mg | ORAL_TABLET | Freq: Two times a day (BID) | ORAL | Status: DC
  Administered 2016-04-13 – 2016-04-16 (×7): 1250 mg via ORAL
  Filled 2016-04-13 (×9): qty 2

## 2016-04-13 NOTE — Progress Notes (Signed)
Subjective: Patient is more alert and awake. He is overall improving. He is taking hjis oral feeding with  No problem. Objective: Vital signs in last 24 hours: Temp:  [97.8 F (36.6 C)-98.9 F (37.2 C)] 98.1 F (36.7 C) (04/15 0600) Pulse Rate:  [62-77] 62 (04/15 0600) Resp:  [16-18] 18 (04/15 0600) BP: (111-149)/(62-87) 117/77 mmHg (04/15 0600) SpO2:  [96 %-100 %] 98 % (04/15 0600) Weight change:  Last BM Date: 04/12/16  Intake/Output from previous day: 04/14 0701 - 04/15 0700 In: 960 [P.O.:960] Out: 850 [Urine:850]  PHYSICAL EXAM General appearance: alert and no distress Resp: clear to auscultation bilaterally Cardio: S1, S2 normal GI: soft, non-tender; bowel sounds normal; no masses,  no organomegaly Extremities: extremities normal, atraumatic, no cyanosis or edema  Lab Results:  Results for orders placed or performed during the hospital encounter of 04/11/16 (from the past 48 hour(s))  CBC with Differential     Status: Abnormal   Collection Time: 04/11/16  4:37 PM  Result Value Ref Range   WBC 11.1 (H) 4.0 - 10.5 K/uL   RBC 4.32 4.22 - 5.81 MIL/uL   Hemoglobin 14.8 13.0 - 17.0 g/dL   HCT 40.1 39.0 - 52.0 %   MCV 92.8 78.0 - 100.0 fL   MCH 34.3 (H) 26.0 - 34.0 pg   MCHC 36.9 (H) 30.0 - 36.0 g/dL   RDW 13.0 11.5 - 15.5 %   Platelets 136 (L) 150 - 400 K/uL   Neutrophils Relative % 68 %   Neutro Abs 7.5 1.7 - 7.7 K/uL   Lymphocytes Relative 17 %   Lymphs Abs 1.9 0.7 - 4.0 K/uL   Monocytes Relative 12 %   Monocytes Absolute 1.4 (H) 0.1 - 1.0 K/uL   Eosinophils Relative 3 %   Eosinophils Absolute 0.3 0.0 - 0.7 K/uL   Basophils Relative 0 %   Basophils Absolute 0.0 0.0 - 0.1 K/uL  Basic metabolic panel     Status: Abnormal   Collection Time: 04/11/16  4:37 PM  Result Value Ref Range   Sodium 137 135 - 145 mmol/L   Potassium 3.6 3.5 - 5.1 mmol/L   Chloride 94 (L) 101 - 111 mmol/L   CO2 33 (H) 22 - 32 mmol/L   Glucose, Bld 134 (H) 65 - 99 mg/dL   BUN 52 (H) 6 -  20 mg/dL   Creatinine, Ser 1.85 (H) 0.61 - 1.24 mg/dL   Calcium 9.2 8.9 - 10.3 mg/dL   GFR calc non Af Amer 36 (L) >60 mL/min   GFR calc Af Amer 41 (L) >60 mL/min    Comment: (NOTE) The eGFR has been calculated using the CKD EPI equation. This calculation has not been validated in all clinical situations. eGFR's persistently <60 mL/min signify possible Chronic Kidney Disease.    Anion gap 10 5 - 15  Hepatic function panel     Status: Abnormal   Collection Time: 04/11/16  4:37 PM  Result Value Ref Range   Total Protein 8.2 (H) 6.5 - 8.1 g/dL   Albumin 3.7 3.5 - 5.0 g/dL   AST 36 15 - 41 U/L   ALT 21 17 - 63 U/L   Alkaline Phosphatase 99 38 - 126 U/L   Total Bilirubin 1.8 (H) 0.3 - 1.2 mg/dL   Bilirubin, Direct 0.4 0.1 - 0.5 mg/dL   Indirect Bilirubin 1.4 (H) 0.3 - 0.9 mg/dL  Lipase, blood     Status: None   Collection Time: 04/11/16  4:37 PM  Result Value Ref Range   Lipase 42 11 - 51 U/L  Hemoglobin A1c     Status: None   Collection Time: 04/11/16  4:37 PM  Result Value Ref Range   Hgb A1c MFr Bld 5.4 4.8 - 5.6 %    Comment: (NOTE)         Pre-diabetes: 5.7 - 6.4         Diabetes: >6.4         Glycemic control for adults with diabetes: <7.0    Mean Plasma Glucose 108 mg/dL    Comment: (NOTE) Performed At: [facility] [place], [place] [id number] Lindon Romp MD JT:[id number]   Urinalysis, Routine w reflex microscopic (not at [facility])     Status: Abnormal   Collection Time: 04/11/16  6:57 PM  Result Value Ref Range   Color, Urine YELLOW YELLOW   APPearance CLEAR CLEAR   Specific Gravity, Urine 1.010 1.005 - 1.030   pH 5.5 5.0 - 8.0   Glucose, UA NEGATIVE NEGATIVE mg/dL   Hgb urine dipstick NEGATIVE NEGATIVE   Bilirubin Urine NEGATIVE NEGATIVE   Ketones, ur NEGATIVE NEGATIVE mg/dL   Protein, ur NEGATIVE NEGATIVE mg/dL   Nitrite NEGATIVE NEGATIVE   Leukocytes, UA TRACE (A) NEGATIVE  Urine microscopic-add on     Status: Abnormal    Collection Time: 04/11/16  6:57 PM  Result Value Ref Range   Squamous Epithelial / LPF 0-5 (A) NONE SEEN   WBC, UA 0-5 0 - 5 WBC/hpf   RBC / HPF 0-5 0 - 5 RBC/hpf   Bacteria, UA RARE (A) NONE SEEN   Casts HYALINE CASTS (A) NEGATIVE  Lipid panel     Status: None   Collection Time: 04/12/16  5:50 AM  Result Value Ref Range   Cholesterol 110 0 - 200 mg/dL   Triglycerides 53 <150 mg/dL   HDL 46 >40 mg/dL   Total CHOL/HDL Ratio 2.4 RATIO   VLDL 11 0 - 40 mg/dL   LDL Cholesterol 53 0 - 99 mg/dL    Comment:        Total Cholesterol/HDL:CHD Risk Coronary Heart Disease Risk Table                     Men   Women  1/2 Average Risk   3.4   3.3  Average Risk       5.0   4.4  2 X Average Risk   9.6   7.1  3 X Average Risk  23.4   11.0        Use the calculated Patient Ratio above and the CHD Risk Table to determine the patient's CHD Risk.        ATP III CLASSIFICATION (LDL):  <100     mg/dL   Optimal  100-129  mg/dL   Near or Above                    Optimal  130-159  mg/dL   Borderline  160-189  mg/dL   High  >190     mg/dL   Very High   CBC     Status: Abnormal   Collection Time: 04/13/16  6:20 AM  Result Value Ref Range   WBC 7.1 4.0 - 10.5 K/uL   RBC 3.47 (L) 4.22 - 5.81 MIL/uL   Hemoglobin 11.6 (L) 13.0 - 17.0 g/dL    Comment: DELTA CHECK NOTED   HCT 32.4 (L) 39.0 -  52.0 %   MCV 93.4 78.0 - 100.0 fL   MCH 33.4 26.0 - 34.0 pg   MCHC 35.8 30.0 - 36.0 g/dL   RDW 13.2 11.5 - 15.5 %   Platelets 100 (L) 150 - 400 K/uL    Comment: SPECIMEN CHECKED FOR CLOTS PLATELET COUNT CONFIRMED BY SMEAR   Basic metabolic panel     Status: Abnormal   Collection Time: 04/13/16  6:20 AM  Result Value Ref Range   Sodium 139 135 - 145 mmol/L   Potassium 3.3 (L) 3.5 - 5.1 mmol/L   Chloride 100 (L) 101 - 111 mmol/L   CO2 30 22 - 32 mmol/L   Glucose, Bld 103 (H) 65 - 99 mg/dL   BUN 39 (H) 6 - 20 mg/dL   Creatinine, Ser 1.43 (H) 0.61 - 1.24 mg/dL   Calcium 8.6 (L) 8.9 - 10.3 mg/dL   GFR  calc non Af Amer 48 (L) >60 mL/min   GFR calc Af Amer 56 (L) >60 mL/min    Comment: (NOTE) The eGFR has been calculated using the CKD EPI equation. This calculation has not been validated in all clinical situations. eGFR's persistently <60 mL/min signify possible Chronic Kidney Disease.    Anion gap 9 5 - 15    ABGS No results for input(s): PHART, PO2ART, TCO2, HCO3 in the last 72 hours.  Invalid input(s): PCO2 CULTURES No results found for this or any previous visit (from the past 240 hour(s)). Studies/Results: Dg Chest 2 View  04/11/2016  CLINICAL DATA:  Altered mental status/confusion EXAM: CHEST  2 VIEW COMPARISON:  March 15, 2016 FINDINGS: Port-A-Cath tip is at the junction of the left innominate vein and superior vena cava. No pneumothorax. There is no edema or consolidation. Heart size and pulmonary vascularity are normal. No adenopathy. There is degenerative change in the lower thoracic and visualized lumbar regions. There is slight anterior wedging of the L1 vertebral body, stable. IMPRESSION: No edema or consolidation. Stable cardiac silhouette. Port-A-Cath tip at the junction of the left innominate vein and superior vena cava. No pneumothorax. Electronically Signed   By: Lowella Grip III M.D.   On: 04/11/2016 17:16   Ct Head Wo Contrast  04/11/2016  CLINICAL DATA:  Weakness, altered mental status, and confusion today, history colon cancer, skin cancer, hypertension, multiple falls in past month EXAM: CT HEAD WITHOUT CONTRAST TECHNIQUE: Contiguous axial images were obtained from the base of the skull through the vertex without intravenous contrast. COMPARISON:  10/19/2015 FINDINGS: Generalized atrophy. Normal ventricular morphology. No midline shift or mass effect. Otherwise normal appearance of brain parenchyma. No intracranial hemorrhage, mass lesion or evidence acute infarction. No extra-axial fluid collections. Mucosal retention cyst LEFT maxillary sinus. Minimal fluid in a  few RIGHT mastoid air cells. No acute bone or sinus abnormalities otherwise identified. IMPRESSION: Generalized atrophy. No acute intracranial abnormalities. Electronically Signed   By: Lavonia Dana M.D.   On: 04/11/2016 17:48   Mr Brain Wo Contrast  04/11/2016  CLINICAL DATA:  70 year old male with weakness, confusion, multiple recent falls. Initial encounter. EXAM: MRI HEAD WITHOUT CONTRAST TECHNIQUE: Multiplanar, multiecho pulse sequences of the brain and surrounding structures were obtained without intravenous contrast. COMPARISON:  Head CT 1739 hours today.  Brain MRI 10/20/2015. FINDINGS: Major intracranial vascular flow voids are stable, with chronic abnormal appearance of the bilateral distal vertebral arteries, although as before there appears to be reconstituted flow in the basilar artery. There is a subtle punctate area of restricted diffusion in the  right paracentral lower midbrain seen on both series 100, image 15 and series 101, image 28. No associated T2 or FLAIR hyperintensity. No associated hemorrhage or mass effect. No restricted diffusion elsewhere. No midline shift, mass effect, evidence of mass lesion, ventriculomegaly, extra-axial collection or acute intracranial hemorrhage. Cervicomedullary junction and pituitary are within normal limits. Negative visualized cervical spine. Stable gray and white matter signal, largely unremarkable for age throughout the brain. No cortical encephalomalacia or chronic cerebral blood products. Visible internal auditory structures appear normal. Stable mild mastoid effusions. New mucosal thickening or mucous retention cyst in the left maxillary sinus. Stable mild ethmoid mucosal thickening. Orbit and scalp soft tissues are negative. Visualized bone marrow signal is within normal limits. IMPRESSION: 1. Suggestion of a tiny acute lacunar infarct in the right midbrain. 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 2. No associated hemorrhage or  mass effect. No other acute intracranial abnormality. Electronically Signed   By: Genevie Ann M.D.   On: 04/11/2016 19:11   US Carotid Bilateral  04/12/2016  CLINICAL DATA:  CVA. EXAM: BILATERAL CAROTID DUPLEX ULTRASOUND TECHNIQUE: Pearline Cables scale imaging, color Doppler and duplex ultrasound were performed of bilateral carotid and vertebral arteries in the neck. COMPARISON:  MRI 04/11/2016. FINDINGS: Criteria: Quantification of carotid stenosis is based on velocity parameters that correlate the residual internal carotid diameter with NASCET-based stenosis levels, using the diameter of the distal internal carotid lumen as the denominator for stenosis measurement. The following velocity measurements were obtained: RIGHT ICA:  80/32 cm/sec CCA:  62/26 cm/sec SYSTOLIC ICA/CCA RATIO:  1.2 DIASTOLIC ICA/CCA RATIO:  1.8 ECA:  72 cm/sec LEFT ICA:  86/21 cm/sec CCA:  333/54 80 cm/sec SYSTOLIC ICA/CCA RATIO:  0.9 DIASTOLIC ICA/CCA RATIO:  0.8 ECA:  75 cm/sec RIGHT CAROTID ARTERY: Mild right carotid bifurcation plaque. No flow limiting stenosis. RIGHT VERTEBRAL ARTERY:  Patent with antegrade flow. LEFT CAROTID ARTERY: Mild plaque left carotid bifurcation. No flow limiting stenosis. LEFT VERTEBRAL ARTERY:  Patent with retrograde flow. IMPRESSION: 1. 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 No flow limiting stenosis. Degree of stenosis less than 50%. 2. Right vertebral is patent antegrade flow. Left vertebral is patent left with retrograde flow. Subclavian steal cannot be excluded. Electronically Signed   By: Marcello Moores  Register   On: 04/12/2016 12:37   Mr Jodene Nam Head/brain Wo Cm  04/11/2016  CLINICAL DATA:  Followup infarct. Air history of hypertension, Parkinson's disease and colon cancer. EXAM: MRA HEAD WITHOUT CONTRAST TECHNIQUE: Angiographic images of the Circle of Willis were obtained using MRA technique without intravenous contrast. COMPARISON:  MRI of the brain May 11, 2016 at 1832 hours FINDINGS:  Anterior circulation: Normal flow related enhancement of the included cervical, petrous, cavernous and supraclinoid internal carotid arteries. Patent anterior communicating artery. Normal flow related enhancement of the anterior and middle cerebral arteries, limited evaluation of mid to distal segments on mid images due to technique though, normal on raw data. No large vessel occlusion, high-grade stenosis, abnormal luminal irregularity, aneurysm. Posterior circulation: Codominant vertebral arteries. Basilar artery is patent, with normal flow related enhancement of the main branch vessels. Normal flow related enhancement of the posterior cerebral arteries. Fetal origin bilateral posterior cerebral arteries. Limited evaluation of mid to distal segments on mid images due to technique though, normal on raw data. No large vessel occlusion, high-grade stenosis, abnormal luminal irregularity, aneurysm. IMPRESSION: Negative MRA head. Electronically Signed   By: Elon Alas M.D.   On: 04/11/2016 22:23    Medications: I have reviewed  the patient's current medications.  Assesment:   Active Problems:   Colon carcinoma (HCC)   CVA (cerebral infarction)   Stroke [facility])   Confusion   Acute CVA (cerebrovascular accident) ([place]) acute renal injury   Plan:  Medications reviewed Decrease IV fluid Will supplement K+ Will monitor CBC/BMP    LOS: 1 day   Lavella Myren 04/13/2016, 8:06 AM

## 2016-04-13 NOTE — Consult Note (Signed)
Referring Provider: No ref. provider found Primary Care Physician:  [name], MD Primary Gastroenterologist:  Barney Drain  Reason for Consultation:  RECTAL BLEEDING   Impression: ADMITTED WITH MENTAL STATUS CHANGES DUE TO ACUTE STROKE. KNOWN HISTORY OF ANAL FISSURE AND AFTER ADMISSION STRAINED TO HAVE A BM AND SAW BRBPR.  Plan: 1. Continue FIBER TABLETS. HOLD BENTYL. 2. ANUSOL BID 3. SENOKOT 2 PO WITH LUNCH & PRN. WILL ADJUST FOR DIARRHEA. PT HAD EXPLOSIVE [place]. 4. CONTINUE PRADAXA IN SETTING OF ACUTE CVA. BENEFITS IN ACUTE CVA OUTWEIGH RISKS OF BLEEDING. DISCUSSED WITH WIFE AND PT.      HPI:  IN USUAL STATE OF HEALTH, BECAME CONFUSED WED. SEEN IN ED THUR AND ADMITTED APR 13. WAS DOING WELL BUT NOT MOVING BOWELS REGULARLY. STRAINED TO HAVE A BM TODAY @10  AM AND HAD BRBPR x1-LARGE AMOUNT. PT DENIES RECTAL PAIN. NO GOOD BM SINCE ADMISSION. HAS NTG OINTMENT FOR RECTUM BUT HASN'T USED IT RECENTLY. PT DENIES FEVER, CHILLS, nausea, vomiting, melena, diarrhea, CHEST PAIN, SHORTNESS OF BREATH,  CHANGE IN BOWEL IN HABITS, abdominal pain, problems swallowing, OR heartburn or indigestion.  Past Medical History  Diagnosis Date  . Varicose veins   . New onset atrial fibrillation ([place]) 01/02/2015  . Morbid obesity ([place]) 01/02/2015  . Hypertension   . Sleep apnea     been tested but has not received the CPAP yet  . GERD (gastroesophageal reflux disease)   . History of gout   . Cancer ([place]) 02/2015    colon  . Family history of colon cancer   . Family history of breast cancer in mother   . Colon cancer [facility])     March 2016  . Parkinson disease [facility])     Past Surgical History  Procedure Laterality Date  . Vein ligation and stripping      left leg  . Colonoscopy N/A 03/28/2015    SLF: 1. Abdominal pain, diarrhea, rectal bleeding due to obstructing colon mass  . Esophagogastroduodenoscopy N/A 03/28/2015    SLF: 1. dysphagia 2. Mild non-erosive gastritis.   .  Esophageal dilation N/A 03/28/2015    Procedure: ESOPHAGEAL DILATION;  Surgeon: [name], MD;  Location: [facility] ENDO SUITE;  Service: Endoscopy;  Laterality: N/A;  . Partial colectomy N/A 04/03/2015    Procedure: PARTIAL COLECTOMY;  Surgeon: [name], MD;  Location: [facility] ORS;  Service: General;  Laterality: N/A;  . Portacath placement N/A 05/10/2015    Procedure: INSERTION PORT-A-CATH;  Surgeon: [name], MD;  Location: [facility] ORS;  Service: General;  Laterality: N/A;  left subclavian  . Skin cancer destruction Left 08/16/15    left side of nose and left back  . Colonoscopy N/A 10/03/2015    SLF: normal anastomosis, fair prep (polyps less than 1cm could be missed), anal fissures and verrucous anal canal lesion with benign biopsy. Next TCS in 09/2016.  . Colonoscopy N/A 10/02/2015    SLF: normal anastomosis, poor bowel prep, three anal fissures    Prior to Admission medications   Medication Sig Start Date End Date Taking? Authorizing Provider  albuterol (PROVENTIL HFA;VENTOLIN HFA) 108 (90 Base) MCG/ACT inhaler Inhale 1-2 puffs into the lungs every 6 (six) hours as needed for wheezing or shortness of breath. Reported on 02/20/2016      bumetanide (BUMEX) 2 MG tablet Take 2 mg by mouth 2 (two) times daily.       carbidopa-levodopa (SINEMET IR) 25-100 MG tablet Take 1 tablet by mouth daily.  Take 1 tablet once daily x1 week, 1 tablet twice daily x1 week, 1 tablet three times daily. 03/13/16     dabigatran (PRADAXA) 150 MG CAPS capsule Take 150 mg by mouth 2 (two) times daily.      dicyclomine (BENTYL) 10 MG capsule Take 1 capsule (10 mg total) by mouth 2 (two) times daily. 1 po 30 MINS BEFORE BREAKFAST AND MAY REPEAT BEFORE LUNCH. 03/22/16     escitalopram (LEXAPRO) 20 MG tablet Take 1 tablet (20 mg total) by mouth daily. 01/02/16     levETIRAcetam (KEPPRA) 500 MG tablet Take 1 tablet (500 mg total) by mouth 2 (two) times daily. 10/24/15     meclizine (ANTIVERT) 25 MG tablet Take 1 tablet (25 mg  total) by mouth 3 (three) times daily as needed for dizziness. 03/22/16     metolazone (ZAROXOLYN) 2.5 MG tablet Take 2.5 mg by mouth every Thursday.      nitroGLYCERIN (NITROGLYN) 2 % ointment Apply 0.5 inches topically as needed for chest pain. Reported on 02/20/2016      omeprazole (PRILOSEC) 20 MG capsule Take 1 capsule (20 mg total) by mouth daily. 03/15/15     potassium chloride SA (K-DUR,KLOR-CON) 20 MEQ tablet Take 1 tablet (20 mEq total) by mouth 2 (two) times daily. 03/22/16     psyllium (METAMUCIL) 58.6 % packet Take 1 packet by mouth daily.      spironolactone (ALDACTONE) 25 MG tablet Take 0.5 tablets (12.5 mg total) by mouth 2 (two) times daily. 03/22/16     losartan (COZAAR) 50 MG tablet Take 1 tablet (50 mg total) by mouth daily. Patient not taking: Reported on 04/11/2016 03/28/16       Current Facility-Administered Medications  Medication Dose Route Frequency Provider Last Rate Last Dose  . 0.9 %  sodium chloride infusion   Intravenous Continuous     . carbidopa-levodopa (SINEMET IR) 25-100 MG per tablet immediate release 1 tablet  1 tablet Oral Daily     . dabigatran (PRADAXA) capsule 150 mg  150 mg Oral BID     . escitalopram (LEXAPRO) tablet 20 mg  20 mg Oral QHS     . hydrocortisone (ANUSOL-HC) suppository 25 mg  25 mg Rectal BID     . levETIRAcetam (KEPPRA) tablet 500 mg  500 mg Oral BID     . pantoprazole (PROTONIX) EC tablet 40 mg  40 mg Oral Daily     . potassium chloride SA (K-DUR,KLOR-CON) CR tablet 40 mEq  40 mEq Oral BID     . senna-docusate (Senokot-S) tablet 1 tablet  1 tablet Oral QHS PRN       Allergies as of 04/11/2016  . (No Known Allergies)    Family History  Problem Relation Age of Onset  . Breast cancer Mother     dx under 77  . Colon cancer Mother     dx in her mid 73s  . Heart disease Father   . Hyperlipidemia Father   . Liver cancer Father     heavy ETOH user when young  . Cancer Sister     slow "blood" cancer  . Diabetes Sister   . Heart  disease Sister   . Diabetes Brother   . Cancer Maternal Aunt     2-3 maternal aunts with Cancer NOS  . Lung cancer Paternal Uncle     three uncles with lung cancer - all smokers  . Cancer Paternal Grandmother     possible bone cancer  Social History   Social History  . Marital Status: Married    Spouse Name: [name]  . Number of Children: 2  . Years of Education: N/A   Occupational History  . Not on file.   Social History Main Topics  . Smoking status: Never Smoker   . Smokeless tobacco: Never Used  . Alcohol Use: No  . Drug Use: No  . Sexual Activity: No   Other Topics Concern  . Not on file   Social History Narrative    Review of Systems: PER HPI OTHERWISE ALL SYSTEMS ARE NEGATIVE.   Vitals: Blood pressure 109/60, pulse 64, temperature 97.7 F (36.5 C), temperature source Oral, resp. rate 18, height 5\' 7"  (1.702 m), weight 239 lb 1.6 oz (108.455 kg), SpO2 100 %.  Physical Exam: General:   Alert,  INTERACTIVE, pleasant and cooperative in NAD Head:  Normocephalic and atraumatic. Eyes:  Sclera clear, no icterus.   Conjunctiva pink. Mouth:  No lesions, dentition ABnormal. Neck:  Supple; no masses. Lungs:  Clear throughout to auscultation.   No wheezes. No acute distress. Heart:  Regular rate and rhythm; no murmurs Abdomen:  Soft, nontender and nondistended. No masses noted. Normal bowel sounds, without guarding, and without rebound.   Msk:  Symmetrical. Extremities:  With edema. Neurologic:  Alert and  oriented x4;  NO  NEW FOCAL DEFICITS Cervical Nodes:  No significant cervical adenopathy. Psych:  Alert and cooperative. Normal mood and affect.  Lab Results:  Recent Labs  04/11/16 1637 04/13/16 0620  WBC 11.1* 7.1  HGB 14.8 11.6*  HCT 40.1 32.4*  PLT 136* 100*   BMET  Recent Labs  04/11/16 1637 04/13/16 0620  NA 137 139  K 3.6 3.3*  CL 94* 100*  CO2 33* 30  GLUCOSE 134* 103*  BUN 52* 39*  CREATININE 1.85* 1.43*  CALCIUM 9.2 8.6*    LFT  Recent Labs  04/11/16 1637  PROT 8.2*  ALBUMIN 3.7  AST 36  ALT 21  ALKPHOS 99  BILITOT 1.8*  BILIDIR 0.4  IBILI 1.4*     Studies/Results: CT HEAD/MRA OF HEAD NAICP, MRI BRAIN APR 13: ACUTE CVA   LOS: 1 day   Radhika Dershem  04/13/2016, 10:45 AM

## 2016-04-14 ENCOUNTER — Inpatient Hospital Stay (HOSPITAL_COMMUNITY): Payer: Medicare Other

## 2016-04-14 DIAGNOSIS — K625 Hemorrhage of anus and rectum: Secondary | ICD-10-CM | POA: Diagnosis not present

## 2016-04-14 LAB — HEMOGLOBIN AND HEMATOCRIT, BLOOD
HEMATOCRIT: 31.6 % — AB (ref 39.0–52.0)
HEMOGLOBIN: 11.4 g/dL — AB (ref 13.0–17.0)

## 2016-04-14 LAB — BASIC METABOLIC PANEL
ANION GAP: 5 (ref 5–15)
BUN: 27 mg/dL — AB (ref 6–20)
CALCIUM: 8.4 mg/dL — AB (ref 8.9–10.3)
CO2: 29 mmol/L (ref 22–32)
Chloride: 104 mmol/L (ref 101–111)
Creatinine, Ser: 1.27 mg/dL — ABNORMAL HIGH (ref 0.61–1.24)
GFR calc Af Amer: >60 mL/min (ref >60)
GFR, EST NON AFRICAN AMERICAN: 56 mL/min — AB (ref >60)
GLUCOSE: 99 mg/dL (ref 65–99)
POTASSIUM: 4.1 mmol/L (ref 3.5–5.1)
SODIUM: 138 mmol/L (ref 135–145)

## 2016-04-14 MED ORDER — ALPRAZOLAM 0.5 MG PO TABS
0.5000 mg | ORAL_TABLET | ORAL | Status: AC
  Administered 2016-04-14: 0.5 mg via ORAL
  Filled 2016-04-14: qty 1

## 2016-04-14 NOTE — Progress Notes (Addendum)
[name], male   DOB: [birth year], 70 y.o.   MRN: [id number]    Assessment/Plan: ADMITTED WITH MENTAL STATUS CHANGES & ACUTE CVA DIAGNOSED. MENTAL STATUS WAXES AND WANES. PT CONTINUES ON PRADAXA & RECTAL BLEEDING IMPROVED. BP/Hb STABLE.  PLAN: 1. CONTINUE ANUSOL 2. MONITOR Hb 3. CONTINUE PRADAXA 4. CONTINUE FIBER/SENOKOT/ANUSOL   Subjective: Since I last evaluated the patient [name]. SMALL AMOUNT OF RECTAL BLEEDING TODAY. TWO BMS YESETERDAY.  Objective: Vital signs in last 24 hours: Filed Vitals:   04/14/16 0815 04/14/16 0823  BP: 96/63 100/63  Pulse: 72   Temp: 98.5 F (36.9 C)   Resp: 18    General appearance: alert, cooperative and no distress Resp: clear to auscultation bilaterally Cardio: regular rate and rhythm GI: soft, non-tender; bowel sounds normal;  NEURO: AROUSABLE AND INTERACTIVE  Lab Results:   4/13 4/16 Hb 14.8 11.4 Cr  1.27     Studies/Results: Ct Head Wo Contrast  04/14/2016  CLINICAL DATA:  70 year old male with new onset of altered mental status. Code stroke. EXAM: CT HEAD WITHOUT CONTRAST TECHNIQUE: Contiguous axial images were obtained from the base of the skull through the vertex without intravenous contrast. COMPARISON:  Brain MRI 04/11/2016.  Head CT 04/11/2016. FINDINGS: Mild cerebral atrophy. No acute intracranial abnormalities. Specifically, no evidence of acute intracranial hemorrhage, no definite findings of acute/subacute cerebral ischemia, no mass, mass effect, hydrocephalus or abnormal intra or extra-axial fluid collections. Visualized paranasal sinuses and mastoids are generally well pneumatized. Small mucosal retention cyst or polyp in the posterior aspect of the left maxillary sinus incompletely visualized. No acute displaced skull fractures are identified. IMPRESSION: 1. No acute intracranial abnormalities. 2. Mild cerebral atrophy. These results will be called to the ordering clinician or  representative by the Radiologist Assistant, and communication documented in the PACS or zVision Dashboard. Electronically Signed   By: Vinnie Langton M.D.   On: 04/14/2016 09:35    Medications: I have reviewed the patient's current medications.   LOS: 5 days   Barney Drain 06/09/2014, 2:23 PM

## 2016-04-14 NOTE — Progress Notes (Signed)
Subjective: Patient had rectal bleeding yesterday. He was see n by GI and advised to continue Anusol suppository. No significant drop in H/H. He is also weak this morning. He is unable to feed himself. Repeat CT Scan didn't show new finding.  Objective: Vital signs in last 24 hours: Temp:  [97.8 F (36.6 C)-98.5 F (36.9 C)] 98.5 F (36.9 C) (04/16 0815) Pulse Rate:  [58-74] 72 (04/16 0815) Resp:  [18-20] 18 (04/16 0815) BP: (96-134)/(60-81) 100/63 mmHg (04/16 0823) SpO2:  [96 %-100 %] 100 % (04/16 0815) Weight change:  Last BM Date: 04/13/16  Intake/Output from previous day: 04/15 0701 - 04/16 0700 In: 720 [P.O.:720] Out: -   PHYSICAL EXAM General appearance: alert and no distress Resp: clear to auscultation bilaterally Cardio: S1, S2 normal GI: soft, non-tender; bowel sounds normal; no masses,  no organomegaly Extremities: extremities normal, atraumatic, no cyanosis or edema  Lab Results:  Results for orders placed or performed during the hospital encounter of 04/11/16 (from the past 48 hour(s))  CBC     Status: Abnormal   Collection Time: 04/13/16  6:20 AM  Result Value Ref Range   WBC 7.1 4.0 - 10.5 K/uL   RBC 3.47 (L) 4.22 - 5.81 MIL/uL   Hemoglobin 11.6 (L) 13.0 - 17.0 g/dL    Comment: DELTA CHECK NOTED   HCT 32.4 (L) 39.0 - 52.0 %   MCV 93.4 78.0 - 100.0 fL   MCH 33.4 26.0 - 34.0 pg   MCHC 35.8 30.0 - 36.0 g/dL   RDW 13.2 11.5 - 15.5 %   Platelets 100 (L) 150 - 400 K/uL    Comment: SPECIMEN CHECKED FOR CLOTS PLATELET COUNT CONFIRMED BY SMEAR   Basic metabolic panel     Status: Abnormal   Collection Time: 04/13/16  6:20 AM  Result Value Ref Range   Sodium 139 135 - 145 mmol/L   Potassium 3.3 (L) 3.5 - 5.1 mmol/L   Chloride 100 (L) 101 - 111 mmol/L   CO2 30 22 - 32 mmol/L   Glucose, Bld 103 (H) 65 - 99 mg/dL   BUN 39 (H) 6 - 20 mg/dL   Creatinine, Ser 1.43 (H) 0.61 - 1.24 mg/dL   Calcium 8.6 (L) 8.9 - 10.3 mg/dL   GFR calc non Af Amer 48 (L) >60 mL/min   GFR calc Af Amer 56 (L) >60 mL/min    Comment: (NOTE) The eGFR has been calculated using the CKD EPI equation. This calculation has not been validated in all clinical situations. eGFR's persistently <60 mL/min signify possible Chronic Kidney Disease.    Anion gap 9 5 - 15  Hemoglobin and hematocrit, blood     Status: Abnormal   Collection Time: 04/14/16  6:25 AM  Result Value Ref Range   Hemoglobin 11.4 (L) 13.0 - 17.0 g/dL   HCT 31.6 (L) 39.0 - 81.0 %  Basic metabolic panel     Status: Abnormal   Collection Time: 04/14/16  6:25 AM  Result Value Ref Range   Sodium 138 135 - 145 mmol/L   Potassium 4.1 3.5 - 5.1 mmol/L    Comment: DELTA CHECK NOTED   Chloride 104 101 - 111 mmol/L   CO2 29 22 - 32 mmol/L   Glucose, Bld 99 65 - 99 mg/dL   BUN 27 (H) 6 - 20 mg/dL   Creatinine, Ser 1.27 (H) 0.61 - 1.24 mg/dL   Calcium 8.4 (L) 8.9 - 10.3 mg/dL   GFR calc non Af Amer 56 (  L) >60 mL/min   GFR calc Af Amer >60 >60 mL/min    Comment: (NOTE) The eGFR has been calculated using the CKD EPI equation. This calculation has not been validated in all clinical situations. eGFR's persistently <60 mL/min signify possible Chronic Kidney Disease.    Anion gap 5 5 - 15    ABGS No results for input(s): PHART, PO2ART, TCO2, HCO3 in the last 72 hours.  Invalid input(s): PCO2 CULTURES No results found for this or any previous visit (from the past 240 hour(s)). Studies/Results: Ct Head Wo Contrast  04/14/2016  CLINICAL DATA:  70 year old male with new onset of altered mental status. Code stroke. EXAM: CT HEAD WITHOUT CONTRAST TECHNIQUE: Contiguous axial images were obtained from the base of the skull through the vertex without intravenous contrast. COMPARISON:  Brain MRI 04/11/2016.  Head CT 04/11/2016. FINDINGS: Mild cerebral atrophy. No acute intracranial abnormalities. Specifically, no evidence of acute intracranial hemorrhage, no definite findings of acute/subacute cerebral ischemia, no mass, mass  effect, hydrocephalus or abnormal intra or extra-axial fluid collections. Visualized paranasal sinuses and mastoids are generally well pneumatized. Small mucosal retention cyst or polyp in the posterior aspect of the left maxillary sinus incompletely visualized. No acute displaced skull fractures are identified. IMPRESSION: 1. No acute intracranial abnormalities. 2. Mild cerebral atrophy. These results will be called to the ordering clinician or representative by the Radiologist Assistant, and communication documented in the PACS or zVision Dashboard. Electronically Signed   By: Vinnie Langton M.D.   On: 04/14/2016 09:35   US Carotid Bilateral  04/12/2016  CLINICAL DATA:  CVA. EXAM: BILATERAL CAROTID DUPLEX ULTRASOUND TECHNIQUE: Pearline Cables scale imaging, color Doppler and duplex ultrasound were performed of bilateral carotid and vertebral arteries in the neck. COMPARISON:  MRI 04/11/2016. FINDINGS: Criteria: Quantification of carotid stenosis is based on velocity parameters that correlate the residual internal carotid diameter with NASCET-based stenosis levels, using the diameter of the distal internal carotid lumen as the denominator for stenosis measurement. The following velocity measurements were obtained: RIGHT ICA:  80/32 cm/sec CCA:  09/29 cm/sec SYSTOLIC ICA/CCA RATIO:  1.2 DIASTOLIC ICA/CCA RATIO:  1.8 ECA:  72 cm/sec LEFT ICA:  86/21 cm/sec CCA:  574/73 80 cm/sec SYSTOLIC ICA/CCA RATIO:  0.9 DIASTOLIC ICA/CCA RATIO:  0.8 ECA:  75 cm/sec RIGHT CAROTID ARTERY: Mild right carotid bifurcation plaque. No flow limiting stenosis. RIGHT VERTEBRAL ARTERY:  Patent with antegrade flow. LEFT CAROTID ARTERY: Mild plaque left carotid bifurcation. No flow limiting stenosis. LEFT VERTEBRAL ARTERY:  Patent with retrograde flow. IMPRESSION: 1. 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 No flow limiting stenosis. Degree of stenosis less than 50%. 2. Right vertebral is patent antegrade flow. Left  vertebral is patent left with retrograde flow. Subclavian steal cannot be excluded. Electronically Signed   By: Marcello Moores  Register   On: 04/12/2016 12:37    Medications: I have reviewed the patient's current medications.  Assesment:   Active Problems:   Colon carcinoma (HCC)   CVA (cerebral infarction)   Stroke [facility])   Confusion   Acute CVA (cerebrovascular accident) ([place]) acute renal injury Rectal bleeding  Plan:  Medications reviewed Gi consult appreciated Will do physical therapy consult Will monitor CBc    LOS: 2 days   Savina Olshefski 04/14/2016, 10:18 AM

## 2016-04-15 ENCOUNTER — Telehealth (HOSPITAL_COMMUNITY): Payer: Self-pay

## 2016-04-15 ENCOUNTER — Encounter (HOSPITAL_COMMUNITY): Payer: Medicare Other

## 2016-04-15 DIAGNOSIS — K625 Hemorrhage of anus and rectum: Secondary | ICD-10-CM

## 2016-04-15 LAB — BASIC METABOLIC PANEL WITH GFR
Anion gap: 6 (ref 5–15)
BUN: 18 mg/dL (ref 6–20)
CO2: 29 mmol/L (ref 22–32)
Calcium: 8.8 mg/dL — ABNORMAL LOW (ref 8.9–10.3)
Chloride: 106 mmol/L (ref 101–111)
Creatinine, Ser: 1.1 mg/dL (ref 0.61–1.24)
GFR calc Af Amer: >60 mL/min
GFR calc non Af Amer: >60 mL/min
Glucose, Bld: 98 mg/dL (ref 65–99)
Potassium: 3.8 mmol/L (ref 3.5–5.1)
Sodium: 141 mmol/L (ref 135–145)

## 2016-04-15 LAB — CBC
HEMATOCRIT: 33.5 % — AB (ref 39.0–52.0)
HEMOGLOBIN: 12.4 g/dL — AB (ref 13.0–17.0)
MCH: 34.7 pg — ABNORMAL HIGH (ref 26.0–34.0)
MCHC: 37 g/dL — AB (ref 30.0–36.0)
MCV: 93.8 fL (ref 78.0–100.0)
Platelets: 96 10*3/uL — ABNORMAL LOW (ref 150–400)
RBC: 3.57 MIL/uL — ABNORMAL LOW (ref 4.22–5.81)
RDW: 13.6 % (ref 11.5–15.5)
WBC: 7 10*3/uL (ref 4.0–10.5)

## 2016-04-15 MED ORDER — PANTOPRAZOLE SODIUM 40 MG PO TBEC
40.0000 mg | DELAYED_RELEASE_TABLET | Freq: Every day | ORAL | Status: DC
  Administered 2016-04-15 – 2016-04-16 (×2): 40 mg via ORAL
  Filled 2016-04-15: qty 1

## 2016-04-15 MED ORDER — POLYETHYLENE GLYCOL 3350 17 G PO PACK
17.0000 g | PACK | Freq: Every day | ORAL | Status: DC
  Administered 2016-04-15: 17 g via ORAL
  Filled 2016-04-15: qty 1

## 2016-04-15 MED ORDER — SODIUM CHLORIDE 0.9% FLUSH: 10.0000 mL | INTRAVENOUS | Status: DC | PRN

## 2016-04-15 MED ORDER — SODIUM CHLORIDE 0.9% FLUSH
10.0000 mL | Freq: Two times a day (BID) | INTRAVENOUS | Status: DC
  Administered 2016-04-15: 20 mL
  Administered 2016-04-15 – 2016-04-16 (×2): 10 mL

## 2016-04-15 NOTE — Progress Notes (Signed)
Subjective: Patient feels better toady. He is more alert and awake. Patient is evaluated by neurology and planned for EEG..  Objective: Vital signs in last 24 hours: Temp:  [97.6 F (36.4 C)-98.6 F (37 C)] 97.7 F (36.5 C) (04/17 0600) Pulse Rate:  [64-70] 65 (04/17 0600) Resp:  [16-18] 18 (04/17 0600) BP: (98-110)/(55-66) 110/64 mmHg (04/17 0600) SpO2:  [99 %-100 %] 100 % (04/17 0600) Weight change:  Last BM Date: 04/13/16  Intake/Output from previous day: 04/16 0701 - 04/17 0700 In: 240 [P.O.:240] Out: 550 [Urine:550]  PHYSICAL EXAM General appearance: alert and no distress Resp: clear to auscultation bilaterally Cardio: S1, S2 normal GI: soft, non-tender; bowel sounds normal; no masses,  no organomegaly Extremities: extremities normal, atraumatic, no cyanosis or edema  Lab Results:  Results for orders placed or performed during the hospital encounter of 04/11/16 (from the past 48 hour(s))  Hemoglobin and hematocrit, blood     Status: Abnormal   Collection Time: 04/14/16  6:25 AM  Result Value Ref Range   Hemoglobin 11.4 (L) 13.0 - 17.0 g/dL   HCT 31.6 (L) 39.0 - 15.9 %  Basic metabolic panel     Status: Abnormal   Collection Time: 04/14/16  6:25 AM  Result Value Ref Range   Sodium 138 135 - 145 mmol/L   Potassium 4.1 3.5 - 5.1 mmol/L    Comment: DELTA CHECK NOTED   Chloride 104 101 - 111 mmol/L   CO2 29 22 - 32 mmol/L   Glucose, Bld 99 65 - 99 mg/dL   BUN 27 (H) 6 - 20 mg/dL   Creatinine, Ser 1.27 (H) 0.61 - 1.24 mg/dL   Calcium 8.4 (L) 8.9 - 10.3 mg/dL   GFR calc non Af Amer 56 (L) >60 mL/min   GFR calc Af Amer >60 >60 mL/min    Comment: (NOTE) The eGFR has been calculated using the CKD EPI equation. This calculation has not been validated in all clinical situations. eGFR's persistently <60 mL/min signify possible Chronic Kidney Disease.    Anion gap 5 5 - 15  Basic metabolic panel     Status: Abnormal   Collection Time: 04/15/16  6:28 AM  Result Value  Ref Range   Sodium 141 135 - 145 mmol/L   Potassium 3.8 3.5 - 5.1 mmol/L   Chloride 106 101 - 111 mmol/L   CO2 29 22 - 32 mmol/L   Glucose, Bld 98 65 - 99 mg/dL   BUN 18 6 - 20 mg/dL   Creatinine, Ser 1.10 0.61 - 1.24 mg/dL   Calcium 8.8 (L) 8.9 - 10.3 mg/dL   GFR calc non Af Amer >60 >60 mL/min   GFR calc Af Amer >60 >60 mL/min    Comment: (NOTE) The eGFR has been calculated using the CKD EPI equation. This calculation has not been validated in all clinical situations. eGFR's persistently <60 mL/min signify possible Chronic Kidney Disease.    Anion gap 6 5 - 15  CBC     Status: Abnormal   Collection Time: 04/15/16  6:28 AM  Result Value Ref Range   WBC 7.0 4.0 - 10.5 K/uL   RBC 3.57 (L) 4.22 - 5.81 MIL/uL   Hemoglobin 12.4 (L) 13.0 - 17.0 g/dL   HCT 33.5 (L) 39.0 - 52.0 %   MCV 93.8 78.0 - 100.0 fL   MCH 34.7 (H) 26.0 - 34.0 pg   MCHC 37.0 (H) 30.0 - 36.0 g/dL   RDW 13.6 11.5 - 15.5 %  Platelets 96 (L) 150 - 400 K/uL    Comment: SPECIMEN CHECKED FOR CLOTS CONSISTENT WITH PREVIOUS RESULT     ABGS No results for input(s): PHART, PO2ART, TCO2, HCO3 in the last 72 hours.  Invalid input(s): PCO2 CULTURES No results found for this or any previous visit (from the past 240 hour(s)). Studies/Results: Ct Head Wo Contrast  04/14/2016  CLINICAL DATA:  70 year old male with new onset of altered mental status. Code stroke. EXAM: CT HEAD WITHOUT CONTRAST TECHNIQUE: Contiguous axial images were obtained from the base of the skull through the vertex without intravenous contrast. COMPARISON:  Brain MRI 04/11/2016.  Head CT 04/11/2016. FINDINGS: Mild cerebral atrophy. No acute intracranial abnormalities. Specifically, no evidence of acute intracranial hemorrhage, no definite findings of acute/subacute cerebral ischemia, no mass, mass effect, hydrocephalus or abnormal intra or extra-axial fluid collections. Visualized paranasal sinuses and mastoids are generally well pneumatized. Small  mucosal retention cyst or polyp in the posterior aspect of the left maxillary sinus incompletely visualized. No acute displaced skull fractures are identified. IMPRESSION: 1. No acute intracranial abnormalities. 2. Mild cerebral atrophy. These results will be called to the ordering clinician or representative by the Radiologist Assistant, and communication documented in the PACS or zVision Dashboard. Electronically Signed   By: Vinnie Langton M.D.   On: 04/14/2016 09:35    Medications: I have reviewed the patient's current medications.  Assesment:   Active Problems:   Colon carcinoma (HCC)   CVA (cerebral infarction)   Stroke [facility])   Confusion   Acute CVA (cerebrovascular accident) [facility])   Rectal bleeding acute renal injury Rectal bleeding  Plan:  Medications reviewed As per neurology plan Will do physical therapy consult Will monitor CBc    LOS: 3 days   [name] 04/15/2016, 8:15 AM

## 2016-04-15 NOTE — Progress Notes (Signed)
    Subjective: Had a BM this morning but strained. Small amount of bright red blood. No rectal pain. Wife concerned over "confusion" spells, where he uses his fingers to eat instead of utensils, for example. Wife tearful. Patient calm.   Objective: Vital signs in last 24 hours: Temp:  [97.6 F (36.4 C)-98.6 F (37 C)] 97.7 F (36.5 C) (04/17 0600) Pulse Rate:  [64-72] 65 (04/17 0600) Resp:  [16-18] 18 (04/17 0600) BP: (96-110)/(55-66) 110/64 mmHg (04/17 0600) SpO2:  [99 %-100 %] 100 % (04/17 0600) Last BM Date: 04/13/16 General:   Alert and oriented, pleasant Head:  Normocephalic and atraumatic. Eyes:  No icterus, sclera clear. Conjuctiva pink.  Abdomen:  Bowel sounds present, soft, non-tender, non-distended. No HSM or hernias noted.  Msk:  Symmetrical without gross deformities. Normal posture. Neurologic:  Alert and  oriented x4 Psych:  Alert and cooperative. Normal mood and affect.  Intake/Output from previous day: 04/16 0701 - 04/17 0700 In: 240 [P.O.:240] Out: 550 [Urine:550] Intake/Output this shift:    Lab Results:  Recent Labs  04/13/16 0620 04/14/16 0625 04/15/16 0628  WBC 7.1  --  7.0  HGB 11.6* 11.4* 12.4*  HCT 32.4* 31.6* 33.5*  PLT 100*  --  96*   BMET  Recent Labs  04/13/16 0620 04/14/16 0625 04/15/16 0628  NA 139 138 141  K 3.3* 4.1 3.8  CL 100* 104 106  CO2 30 29 29   GLUCOSE 103* 99 98  BUN 39* 27* 18  CREATININE 1.43* 1.27* 1.10  CALCIUM 8.6* 8.4* 8.8*     Studies/Results: Ct Head Wo Contrast  04/14/2016  CLINICAL DATA:  70 year old male with new onset of altered mental status. Code stroke. EXAM: CT HEAD WITHOUT CONTRAST TECHNIQUE: Contiguous axial images were obtained from the base of the skull through the vertex without intravenous contrast. COMPARISON:  Brain MRI 04/11/2016.  Head CT 04/11/2016. FINDINGS: Mild cerebral atrophy. No acute intracranial abnormalities. Specifically, no evidence of acute intracranial hemorrhage, no definite  findings of acute/subacute cerebral ischemia, no mass, mass effect, hydrocephalus or abnormal intra or extra-axial fluid collections. Visualized paranasal sinuses and mastoids are generally well pneumatized. Small mucosal retention cyst or polyp in the posterior aspect of the left maxillary sinus incompletely visualized. No acute displaced skull fractures are identified. IMPRESSION: 1. No acute intracranial abnormalities. 2. Mild cerebral atrophy. These results will be called to the ordering clinician or representative by the Radiologist Assistant, and communication documented in the PACS or zVision Dashboard. Electronically Signed   By: Vinnie Langton M.D.   On: 04/14/2016 09:35    Assessment: 70 year old male admitted with mental status changes due to acute stroke. Prior GI history pertinent for stage IIA colon cancer s/p colectomy April 2016. Last colonoscopy in Oct 2016 with normal anastomosis, anal fissures, and he is due for surveillance in Oct 2017. Likely benign anorectal bleeding in setting of anticoagulation and straining. 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 Recommend EGD at time of colonoscopy in October. For now, supportive measures. Continue Pradaxa, as benefits outweigh the risks. Still reports straining. Will add Miralax to regimen. Will follow along with you.     Plan: Add Miralax each afternoon Continue Senokot at lunch Continue Anusol suppository BID Outpatient colonoscopy +/- EGD in October 2017   Orvil Feil, ANP-BC [facility]    LOS: 3 days    04/15/2016, 8:11 AM

## 2016-04-15 NOTE — Consult Note (Addendum)
[place] A. [name], MD     [email or web address]          [name] is an 70 y.o. male.   ASSESSMENT/PLAN: Recurrent episodes of confusion of unclear etiology. We suspect that the patient could be having complex partial seizures.  Small lacunar infarct unlikely causing the patient's confusion.  Chronic atrial fibrillation on chronic anticoagulation.  Multifactorial gait impairment including osteoarthritis, aging and mild evidence of myelopathy.  Mild Parkinson disease.  RECOMMENDATION: EEG. Increase Keppra to 750 mg twice a day. Physical therapy. Continue with Parkinson's medication and also medications for atrial fibrillation/anticoagulation.  Patient is 70 year old white male who weeks in the office for new diagnosis Parkinson disease and multifactorial gait impairment. The patient has had evidence of mild myelopathy with brisk reflexes and MRI findings showing mild cord compression. He has been using a walker to ambulate. Over last several months she's had episodes of confusional episodes of unclear etiology. Imaging has failed to show anatomical explanation. He was treated empirically with Keppra and seemed to have responded to this on to recently. He had an episode a few days ago of confusion and alteration of consciousness associated with picking at things and putting things in his mouth. It was also associated with urinary incontinence. The wife witnessed the episode and reports that it lasted for several hours. The patient is amnestic to the event. Initial imaging showed evidence of a lacunar infarct but this on likely explains the confusion. He was started on Sinemet several weeks ago but again he was having these episodes before the Sinemet was started. He had another episode last night of confusion. Repeat CT scan was unrevealing. Wife reports that during the events he can answer questions appropriately but still seems confused and disoriented. The  patient again is amnestic throughout the event and for the event. The patient has had an episode of rectal bleeding apparently due to known history of fissures. He is seeing Dr. Oneida Alar for the problem. The review of systems otherwise negative.   GENERAL: Pleasant in no acute distress.  HEENT: Supple. Atraumatic normocephalic.   ABDOMEN: soft  EXTREMITIES: No edema   BACK: Normal.  SKIN: Normal by inspection.    MENTAL STATUS: Alert and oriented. Speech, language and cognition are generally intact. Judgment and insight normal.   CRANIAL NERVES: Pupils are equal, round and reactive to light and accommodation; extra ocular movements are full, there is no significant nystagmus; visual fields are full; upper and lower facial muscles are normal in strength and symmetric, there is no flattening of the nasolabial folds; tongue is midline; uvula is midline; shoulder elevation is normal.  MOTOR: Normal tone, bulk and strength; no pronator drift.  COORDINATION: Left finger to nose is normal, right finger to nose is normal, No rest tremor; no intention tremor; no postural tremor; no bradykinesia.  REFLEXES: Deep tendon reflexes are symmetrical and normal. Babinski reflexes are flexor bilaterally.   SENSATION: Normal to light touch.   Blood pressure 110/64, pulse 65, temperature 97.7 F (36.5 C), temperature source Oral, resp. rate 18, height _0  (1.702 m), weight 239 lb 1.6 oz (108.455 kg), SpO2 100 %.  Past Medical History  Diagnosis Date  . Varicose veins   . New onset atrial fibrillation ([place]) 01/02/2015  . Morbid obesity ([place]) 01/02/2015  . Hypertension   . Sleep apnea     been tested but has not received the CPAP yet  . GERD (gastroesophageal reflux disease)   . History of gout   .  Cancer ([place]) 02/2015    colon  . Family history of colon cancer   . Family history of breast cancer in mother   . Colon cancer [facility])     March 2016  . Parkinson disease [facility])     Past Surgical  History  Procedure Laterality Date  . Vein ligation and stripping      left leg  . Colonoscopy N/A 03/28/2015    SLF: 1. Abdominal pain, diarrhea, rectal bleeding due to obstructing colon mass  . Esophagogastroduodenoscopy N/A 03/28/2015    SLF: 1. dysphagia 2. Mild non-erosive gastritis.   . Esophageal dilation N/A 03/28/2015    Procedure: ESOPHAGEAL DILATION;  Surgeon: [name], MD;  Location: [facility] ENDO SUITE;  Service: Endoscopy;  Laterality: N/A;  . Partial colectomy N/A 04/03/2015    Procedure: PARTIAL COLECTOMY;  Surgeon: [name], MD;  Location: [facility] ORS;  Service: General;  Laterality: N/A;  . Portacath placement N/A 05/10/2015    Procedure: INSERTION PORT-A-CATH;  Surgeon: [name], MD;  Location: [facility] ORS;  Service: General;  Laterality: N/A;  left subclavian  . Skin cancer destruction Left 08/16/15    left side of nose and left back  . Colonoscopy N/A 10/03/2015    SLF: normal anastomosis, fair prep (polyps less than 1cm could be missed), anal fissures and verrucous anal canal lesion with benign biopsy. Next TCS in 09/2016.  . Colonoscopy N/A 10/02/2015    SLF: normal anastomosis, poor bowel prep, three anal fissures    Family History  Problem Relation Age of Onset  . Breast cancer Mother     dx under 62  . Colon cancer Mother     dx in her mid 18s  . Heart disease Father   . Hyperlipidemia Father   . Liver cancer Father     heavy ETOH user when young  . Cancer Sister     slow "blood" cancer  . Diabetes Sister   . Heart disease Sister   . Diabetes Brother   . Cancer Maternal Aunt     2-3 maternal aunts with Cancer NOS  . Lung cancer Paternal Uncle     three uncles with lung cancer - all smokers  . Cancer Paternal Grandmother     possible bone cancer    Social History:  reports that he has never smoked. He has never used smokeless tobacco. He reports that he does not drink alcohol or use illicit drugs.  Allergies: No Known Allergies  Medications: Prior  to Admission medications   Medication Sig Start Date End Date Taking? Authorizing Provider  albuterol (PROVENTIL HFA;VENTOLIN HFA) 108 (90 Base) MCG/ACT inhaler Inhale 1-2 puffs into the lungs every 6 (six) hours as needed for wheezing or shortness of breath. Reported on 02/20/2016   Yes Historical Provider, MD  bumetanide (BUMEX) 2 MG tablet Take 2 mg by mouth 2 (two) times daily.    Yes Historical Provider, MD  carbidopa-levodopa (SINEMET IR) 25-100 MG tablet Take 1 tablet by mouth daily. Take 1 tablet once daily x1 week, 1 tablet twice daily x1 week, 1 tablet three times daily. 03/13/16  Yes Historical Provider, MD  dabigatran (PRADAXA) 150 MG CAPS capsule Take 150 mg by mouth 2 (two) times daily.   Yes Historical Provider, MD  dicyclomine (BENTYL) 10 MG capsule Take 1 capsule (10 mg total) by mouth 2 (two) times daily. 1 po 30 MINS BEFORE BREAKFAST AND MAY REPEAT BEFORE LUNCH. 03/22/16  Yes [name], MD  escitalopram (  LEXAPRO) 20 MG tablet Take 1 tablet (20 mg total) by mouth daily. 01/02/16  Yes [name], PA-C  levETIRAcetam (KEPPRA) 500 MG tablet Take 1 tablet (500 mg total) by mouth 2 (two) times daily. 10/24/15  Yes [name], MD  meclizine (ANTIVERT) 25 MG tablet Take 1 tablet (25 mg total) by mouth 3 (three) times daily as needed for dizziness. 03/22/16  Yes [name], MD  metolazone (ZAROXOLYN) 2.5 MG tablet Take 2.5 mg by mouth every Thursday.   Yes Historical Provider, MD  nitroGLYCERIN (NITROGLYN) 2 % ointment Apply 0.5 inches topically as needed for chest pain. Reported on 02/20/2016   Yes Historical Provider, MD  omeprazole (PRILOSEC) 20 MG capsule Take 1 capsule (20 mg total) by mouth daily. 03/15/15  Yes [name], PA-C  potassium chloride SA (K-DUR,KLOR-CON) 20 MEQ tablet Take 1 tablet (20 mEq total) by mouth 2 (two) times daily. 03/22/16  Yes [name], MD  psyllium (METAMUCIL) 58.6 % packet Take 1 packet by mouth daily.   Yes Historical Provider, MD    spironolactone (ALDACTONE) 25 MG tablet Take 0.5 tablets (12.5 mg total) by mouth 2 (two) times daily. 03/22/16  Yes [name], MD  losartan (COZAAR) 50 MG tablet Take 1 tablet (50 mg total) by mouth daily. Patient not taking: Reported on 04/11/2016 03/28/16   Erlene Quan, PA-C    Scheduled Meds: . carbidopa-levodopa  1 tablet Oral Daily  . dabigatran  150 mg Oral BID  . escitalopram  20 mg Oral QHS  . hydrocortisone  25 mg Rectal BID  . levETIRAcetam  500 mg Oral BID  . pantoprazole  40 mg Oral Daily  . polycarbophil  1,250 mg Oral BID  . senna-docusate  2 tablet Oral Q lunch   Continuous Infusions:  PRN Meds:.senna-docusate     Results for orders placed or performed during the hospital encounter of 04/11/16 (from the past 48 hour(s))  Hemoglobin and hematocrit, blood     Status: Abnormal   Collection Time: 04/14/16  6:25 AM  Result Value Ref Range   Hemoglobin 11.4 (L) 13.0 - 17.0 g/dL   HCT 31.6 (L) 39.0 - 16.1 %  Basic metabolic panel     Status: Abnormal   Collection Time: 04/14/16  6:25 AM  Result Value Ref Range   Sodium 138 135 - 145 mmol/L   Potassium 4.1 3.5 - 5.1 mmol/L    Comment: DELTA CHECK NOTED   Chloride 104 101 - 111 mmol/L   CO2 29 22 - 32 mmol/L   Glucose, Bld 99 65 - 99 mg/dL   BUN 27 (H) 6 - 20 mg/dL   Creatinine, Ser 1.27 (H) 0.61 - 1.24 mg/dL   Calcium 8.4 (L) 8.9 - 10.3 mg/dL   GFR calc non Af Amer 56 (L) >60 mL/min   GFR calc Af Amer >60 >60 mL/min    Comment: (NOTE) The eGFR has been calculated using the CKD EPI equation. This calculation has not been validated in all clinical situations. eGFR's persistently <60 mL/min signify possible Chronic Kidney Disease.    Anion gap 5 5 - 15  Basic metabolic panel     Status: Abnormal   Collection Time: 04/15/16  6:28 AM  Result Value Ref Range   Sodium 141 135 - 145 mmol/L   Potassium 3.8 3.5 - 5.1 mmol/L   Chloride 106 101 - 111 mmol/L   CO2 29 22 - 32 mmol/L   Glucose, Bld 98 65 - 99  mg/dL   BUN 18 6 - 20 mg/dL   Creatinine, Ser 1.10 0.61 - 1.24 mg/dL   Calcium 8.8 (L) 8.9 - 10.3 mg/dL   GFR calc non Af Amer >60 >60 mL/min   GFR calc Af Amer >60 >60 mL/min    Comment: (NOTE) The eGFR has been calculated using the CKD EPI equation. This calculation has not been validated in all clinical situations. eGFR's persistently <60 mL/min signify possible Chronic Kidney Disease.    Anion gap 6 5 - 15  CBC     Status: Abnormal   Collection Time: 04/15/16  6:28 AM  Result Value Ref Range   WBC 7.0 4.0 - 10.5 K/uL   RBC 3.57 (L) 4.22 - 5.81 MIL/uL   Hemoglobin 12.4 (L) 13.0 - 17.0 g/dL   HCT 33.5 (L) 39.0 - 52.0 %   MCV 93.8 78.0 - 100.0 fL   MCH 34.7 (H) 26.0 - 34.0 pg   MCHC 37.0 (H) 30.0 - 36.0 g/dL   RDW 13.6 11.5 - 15.5 %   Platelets 96 (L) 150 - 400 K/uL    Comment: SPECIMEN CHECKED FOR CLOTS CONSISTENT WITH PREVIOUS RESULT     UA UNREMARKABLE    Studies/Results:   HEAD CT 04/14/16 FINDINGS: Mild cerebral atrophy. No acute intracranial abnormalities. Specifically, no evidence of acute intracranial hemorrhage, no definite findings of acute/subacute cerebral ischemia, no mass, mass effect, hydrocephalus or abnormal intra or extra-axial fluid collections. Visualized paranasal sinuses and mastoids are generally well pneumatized. Small mucosal retention cyst or polyp in the posterior aspect of the left maxillary sinus incompletely visualized. No acute displaced skull fractures are identified.   IMPRESSION: 1. No acute intracranial abnormalities. 2. Mild cerebral atrophy.  MRI/MRA BRAIN  Negative MRA head.      FINDINGS: Major intracranial vascular flow voids are stable, with chronic abnormal appearance of the bilateral distal vertebral arteries, although as before there appears to be reconstituted flow in the basilar artery. There is a subtle punctate area of restricted diffusion in the right paracentral lower midbrain seen on both series 100,  image 15 and series 101, image 28. No associated T2 or FLAIR hyperintensity. No associated hemorrhage or mass effect.   No restricted diffusion elsewhere. No midline shift, mass effect, evidence of mass lesion, ventriculomegaly, extra-axial collection or acute intracranial hemorrhage. Cervicomedullary junction and pituitary are within normal limits. Negative visualized cervical spine. Stable gray and white matter signal, largely unremarkable for age throughout the brain. No cortical encephalomalacia or chronic cerebral blood products.   Visible internal auditory structures appear normal. Stable mild mastoid effusions. New mucosal thickening or mucous retention cyst in the left maxillary sinus. Stable mild ethmoid mucosal thickening. Orbit and scalp soft tissues are negative. Visualized bone marrow signal is within normal limits.   IMPRESSION: 1. Suggestion of a tiny acute lacunar infarct in the right midbrain. 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 2. No associated hemorrhage or mass effect. No other acute intracranial abnormality.      Joscelynn Brutus A. Merlene Beasley, M.D.  Diplomate, Tax adviser of Psychiatry and Neurology ( Neurology). 04/15/2016, 8:22 AM

## 2016-04-15 NOTE — Evaluation (Signed)
Speech Language Pathology Evaluation Patient Details Name: BILAAL DESAUTEL MRN: [id number] DOB: Apr 04, 1946 Today's Date: 04/15/2016 Time: [id number] SLP Time Calculation (min) (ACUTE ONLY): 38 min  Problem List:  Patient Active Problem List   Diagnosis Date Noted  . Rectal bleeding 04/14/2016  . Acute CVA (cerebrovascular accident) ([place]) 04/12/2016  . CVA (cerebral infarction) 04/11/2016  . Stroke ([place]) 04/11/2016  . Confusion 04/11/2016  . Chronic anticoagulation 03/28/2016  . Anasarca 03/21/2016  . Cirrhosis ([place])   . Volume overload 03/16/2016  . Diastolic CHF, acute on chronic (HCC) 03/16/2016  . NASH (nonalcoholic steatohepatitis) 03/16/2016  . Hypoalbuminemia 03/16/2016  . Normocytic anemia 03/16/2016  . Thrombocytopenia ([place]) 03/16/2016  . Fatty liver 02/06/2016  . 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 Altered mental status   . Abnormal LFTs   . Fecal incontinence   . Peripheral edema 10/15/2015  . Generalized weakness 10/15/2015  . DVT (deep venous thrombosis) ([place]) 10/15/2015  . Atrial fibrillation ([place]) 10/15/2015  . Change in bowel habits   . Constipation 09/20/2015  . Elevated liver enzymes 09/20/2015  . Cellulitis 08/04/2015  . Family history of colon cancer   . Family history of breast cancer in mother   . Colon carcinoma ([place]) 04/03/2015  . Occult blood in stools   . Dysphagia, pharyngoesophageal phase   . Melena 03/15/2015  . Heme positive stool 03/15/2015  . Esophageal dysphagia 03/15/2015  . Diarrhea 03/15/2015  . Morbid obesity ([place]) 01/02/2015  . Varicose veins of lower extremities with other complications [id number]   Past Medical History:  Past Medical History  Diagnosis Date  . Varicose veins   . New onset atrial fibrillation ([place]) 01/02/2015  . Morbid obesity ([place]) 01/02/2015  . Hypertension   . Sleep apnea     been tested but has not received the CPAP yet  . GERD (gastroesophageal reflux disease)   . History of gout   .  Cancer ([place]) 02/2015    colon  . Family history of colon cancer   . Family history of breast cancer in mother   . Colon cancer [facility])     March 2016  . Parkinson disease [facility])    Past Surgical History:  Past Surgical History  Procedure Laterality Date  . Vein ligation and stripping      left leg  . Colonoscopy N/A 03/28/2015    SLF: 1. Abdominal pain, diarrhea, rectal bleeding due to obstructing colon mass  . Esophagogastroduodenoscopy N/A 03/28/2015    SLF: 1. dysphagia 2. Mild non-erosive gastritis.   . Esophageal dilation N/A 03/28/2015    Procedure: ESOPHAGEAL DILATION;  Surgeon: [name], MD;  Location: [facility] ENDO SU[facility];  Service: Endoscopy;  Laterality: N/A;  . Partial colectomy N/A 04/03/2015    Procedure: PARTIAL COLECTOMY;  Surgeon: [name], MD;  Location: [facility] ORS;  Service: General;  Laterality: N/A;  . Portacath placement N/A 05/10/2015    Procedure: INSERTION PORT-A-CATH;  Surgeon: [name], MD;  Location: [facility] ORS;  Service: General;  Laterality: N/A;  left subclavian  . Skin cancer destruction Left 08/16/15    left side of nose and left back  . Colonoscopy N/A 10/03/2015    SLF: normal anastomosis, fair prep (polyps less than 1cm could be missed), anal fissures and verrucous anal canal lesion with benign biopsy. Next TCS in 09/2016.  . Colonoscopy N/A 10/02/2015    SLF: normal anastomosis, poor bowel prep, three anal fissures   HPI:  Pt presents to the hospital with  intermittent episodes of confusion for past 2 days. As per patient's wife patient has been having these episodes since October last year, his last episode of confusion was a month ago. Wife describes this as weird behavior as patient would "start urinating on the floor, putting socks over his shoes". But these episodes last only for a few hours and patient is back to his normal self. At this time patient is not confused. Workup in the ED showed tiny acute lacunar infarct in the right mid brain.    Assessment / Plan / Recommendation Clinical Impression  Pt was evaluated at bedside by means of the [facility] Cognitive Assessment ([facility]) and presents with moderate to moderately severe cognitive impairment. Upon initiating evaluation, wife reports patient is not having an episode of confusion at this time; the current presentation during eval is "normal"/baseline since October. Patient was not oriented to season or month (time) but was oriented to person and place. Pt masks deficits with jovial and pleasant personality. Pt presented with severe deficits in visuo spatial/executive functioning task unable to complete any of the presented tasks (SLP provided cues for stimulability with very little improvement from patient), he named 2/3 pictures presented, demonstrated deficits in working memory, attention, generational naming (even with max verbal cues and numerous explanations of task only able to name 3 items in targeted category), and pt recalled 0% of words for delayed recall. Family (wife and son) and patient were educated of deficits noted and SLP educated that pt is having confusion not isolated to "confusion episodes". Wife locked eyes with SLP from across the room after provided education and said "[place]". Deficits are noted across cognitive linguistic areas assessed and recommend speech therapy in Out Patient setting for more indepth cognitive assessment and treatment as indicated; furthermore patient and wife will benefit from education of impairments noted and education of possible progression of cognitive decline and implementation of compensatory strategies.    SLP Assessment  Patient needs continued Speech Lanaguage Pathology Services    Follow Up Recommendations  Outpatient SLP          SLP Evaluation Prior Functioning  Type of Home: House  Lives With: Spouse Available Help at Discharge: Family;Available 24 hours/day   Cognition  Overall Cognitive Status: Impaired/Different  from baseline Arousal/Alertness: Awake/alert Orientation Level: Oriented to place;Oriented to person;Oriented to situation;Disoriented to time Attention: Focused Focused Attention: Impaired Focused Attention Impairment: Verbal basic Memory: Impaired Memory Impairment: Decreased short term memory;Decreased recall of new information Decreased Short Term Memory: Verbal basic Awareness: Impaired Problem Solving: Impaired Problem Solving Impairment: Verbal basic Executive Function: Sequencing;Reasoning;Decision Making;Initiating Reasoning: Impaired Reasoning Impairment: Verbal basic Sequencing: Impaired Sequencing Impairment: Verbal basic Decision Making: Impaired Decision Making Impairment: Verbal basic Initiating: Impaired    Comprehension  Auditory Comprehension Overall Auditory Comprehension: Impaired Yes/No Questions: Within Functional Limits Commands: Within Functional Limits    Expression Expression Primary Mode of Expression: Verbal Verbal Expression Overall Verbal Expression: Appears within functional limits for tasks assessed Initiation: No impairment Level of Generative/Spontaneous Verbalization: Word Repetition: No impairment Naming: Impairment Responsive: 51-75% accurate   Oral / Motor  Oral Motor/Sensory Function Overall Oral Motor/Sensory Function: Within functional limits    Taysen Bushart H. Roddie Mc, CCC-SLP Speech Language Pathologist       Wende Bushy 04/15/2016, 5:19 PM

## 2016-04-15 NOTE — Evaluation (Addendum)
Physical Therapy Evaluation Patient Details Name: [name] MRN: [id number] DOB: 1946-10-11 Today's Date: 04/15/2016   History of Present Illness  70 year old male who  has a past medical history of Varicose veins; New onset atrial fibrillation ([place]) (01/02/2015); Morbid obesity ([place]) (01/02/2015); Hypertension; Sleep apnea; GERD (gastroesophageal reflux disease); History of gout; Cancer ([place]) (02/2015); Family history of colon cancer; Family history of breast cancer in mother; Colon cancer ([place]); and Parkinson disease ([place]).  Today presents to the hospital with intermittent episodes of confusion for past 2 days. As per patient's wife patient has been having these episodes since October last year, his last episode of confusion was a month ago. Wife describes this as weird behavior as patient would "start urinating on the floor, putting socks over his shoes". But these episodes last only for a few hours and patient is back to his normal self. At this time patient is not confused. Workup in the ED showed tiny acute lacunar infarct in the right mid brain.  Clinical Impression  Pt received in bed, wife present, and pt is agreeable to PT re-evaluation.  Pt had an episode of BRBPR, as well as an episode of confusion and weakness after last PT evaluation.  Today, pt required slightly more assistance for all functional mobility tasks.  He was able to perform bed mobility with CGA, as well as transfers sit<>stand, and 3 trials of gait x45ft, 40ft, and 13ft all with RW and CGA but required seated recovery periods in between.  Resting vitals: 107/68, SpO2: 100% on RA, and HR: 68bpm.  After end of PT session BP: 137/77, SpO2: 100%, and HR: 78bpm.   At this time, he would benefit from continued skilled acute PT due to increasing weakness associated with prolonged hospitalization and immobility.  Continue to recommend that pt f/u with OPPT and OPOT as previously scheduled.      Follow Up Recommendations Outpatient  PT (f/u with OPPT)    Equipment Recommendations  None recommended by PT    Recommendations for Other Services OT consult     Precautions / Restrictions Precautions Precautions: Fall Restrictions Weight Bearing Restrictions: No      Mobility  Bed Mobility Overal bed mobility: Needs Assistance Bed Mobility: Supine to Sit     Supine to sit: Min guard;HOB elevated     General bed mobility comments: Increased time for transfer, and CGA for trunk stability while scooting hips to the EOB.  Transfers Overall transfer level: Needs assistance Equipment used: Rolling walker (2 wheeled) Transfers: Sit to/from Stand Sit to Stand: Min guard         General transfer comment: Pt requires several attempts to perform sit<>stand from bed, which is a low surface, pt also required vc's x 1 for stand<>sit for safe hand placement, but demonstrated follow through with additional sit<>stand transfers.   Ambulation/Gait Ambulation/Gait assistance: Min guard Ambulation Distance (Feet): 80 Feet (x3 trials: 1) 62ft, 2) 52ft 3) 8ft) Assistive device: Rolling walker (2 wheeled) Gait Pattern/deviations: Step-through pattern;Trunk flexed     General Gait Details: Pt continues with decreased cadence, and pt continues to fatigue quickly.  Wife suprised he could not ambulate further.  Pt required 3 seated recovery periods due to fatigue.   Stairs            Wheelchair Mobility    Modified Rankin (Stroke Patients Only)       Balance Overall balance assessment: Needs assistance         Standing balance support: Single  extremity supported Standing balance-Leahy Scale: Fair                               Pertinent Vitals/Pain Pain Assessment: No/denies pain    Home Living Family/patient expects to be discharged to:: Private residence Living Arrangements: Spouse/significant other Available Help at Discharge: Family;Available 24 hours/day Type of Home: House Home  Access: Ramped entrance     Home Layout: One level Home Equipment: Walker - 2 wheels;Bedside commode;Cane - single point;Wheelchair - manual      Prior Function Level of Independence: Needs assistance   Gait / Transfers Assistance Needed: Pt ambulates with a RW.  Wife reports that she has to assist him with sit<>stand transfers nearly every time except if the chair has 2 arm rests.  Pt states that most of the time he can walk from one end of the house to the other without rest break.   ADL's / Homemaking Assistance Needed: some assist with bathing/dressing        Hand Dominance   Dominant Hand: Right    Extremity/Trunk Assessment   Upper Extremity Assessment: Overall WFL for tasks assessed           Lower Extremity Assessment: Overall WFL for tasks assessed RLE Deficits / Details: 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 during gait       Communication   Communication: No difficulties  Cognition Arousal/Alertness: Awake/alert Behavior During Therapy: WFL for tasks assessed/performed Overall Cognitive Status: Within Functional Limits for tasks assessed (Wife expressed that he keep having spells where he has increased confusion.  Pt is A&Ox4 upon re-eval today, but wife states he can always answer orientation questions.  Confusion comes in when he tries to drink things that aren't there, socks over shoes)                      General Comments      Exercises        Assessment/Plan    PT Assessment Patient needs continued PT services  PT Diagnosis Difficulty walking;Generalized weakness   PT Problem List Decreased strength;Decreased activity tolerance;Decreased balance;Decreased mobility;Decreased safety awareness;Decreased cognition  PT Treatment Interventions Gait training;Functional mobility training;Therapeutic activities;Therapeutic exercise;Balance training;Cognitive remediation;Patient/family education   PT Goals (Current goals can be found in  the Care Plan section) Acute Rehab PT Goals Patient Stated Goal: Pt wants to go home. PT Goal Formulation: With patient/family Time For Goal Achievement: 04/22/16 Potential to Achieve Goals: Good    Frequency 7x's/wk   Barriers to discharge        Co-evaluation               End of Session Equipment Utilized During Treatment: Gait belt Activity Tolerance: Patient tolerated treatment well Patient left: in chair;with family/visitor present;with call bell/phone within reach Nurse Communication: Mobility status    Functional Assessment Tool Used: Clinical Judgement Functional Limitation: Mobility: Walking and moving around Mobility: Walking and Moving Around Current Status [id number]): At least 40 percent but less than 60 percent impaired, limited or restricted Mobility: Walking and Moving Around Goal Status [phone]): At least 20 percent but less than 40 percent impaired, limited or restricted    Time: 1020-1043 PT Time Calculation (min) (ACUTE ONLY): 23 min   Charges:   PT Evaluation $PT Re-evaluation: 1 Procedure PT Treatments $Gait Training: 8-22 mins   PT G Codes:   PT G-Codes **NOT FOR INPATIENT CLASS** Functional Assessment  Tool Used: Clinical Judgement Functional Limitation: Mobility: Walking and moving around Mobility: Walking and Moving Around Current Status [phone]): At least 40 percent but less than 60 percent impaired, limited or restricted Mobility: Walking and Moving Around Goal Status [phone]): At least 20 percent but less than 40 percent impaired, limited or restricted    Eustaquio Maize Secret Kristensen, PT, DPT X: [id number]   04/15/2016, 11:03 AM

## 2016-04-15 NOTE — Telephone Encounter (Signed)
Beth from the Hospital called for Mr. Claxton to cx these apptments OTeval/PT treatment since he is in the hospital. NF 04/15/16 11:58 am

## 2016-04-15 NOTE — Care Management Important Message (Signed)
Important Message  Patient Details  Name: [name] MRN: [id number] Date of Birth: 09/25/1946   Medicare Important Message Given:  Yes    Alvie Heidelberg, RN 04/15/2016, 5:01 PM

## 2016-04-16 ENCOUNTER — Inpatient Hospital Stay (HOSPITAL_COMMUNITY)
Admit: 2016-04-16 | Discharge: 2016-04-16 | Disposition: A | Discharge status: Home / Self Care | Payer: Medicare Other | Attending: Neurology | Admitting: Neurology

## 2016-04-16 ENCOUNTER — Encounter (HOSPITAL_COMMUNITY): Payer: Medicare Other

## 2016-04-16 ENCOUNTER — Ambulatory Visit (HOSPITAL_COMMUNITY): Payer: Medicare Other

## 2016-04-16 MED ORDER — LEVETIRACETAM 500 MG PO TABS: 750.0000 mg | ORAL_TABLET | Freq: Two times a day (BID) | ORAL | Status: AC

## 2016-04-16 MED ORDER — HEPARIN SOD (PORK) LOCK FLUSH 100 UNIT/ML IV SOLN
500.0000 [IU] | INTRAVENOUS | Status: AC | PRN
  Administered 2016-04-16: 500 [IU]
  Filled 2016-04-16: qty 5

## 2016-04-16 MED ORDER — HYDROCORTISONE ACETATE 25 MG RE SUPP: 25.0000 mg | Freq: Two times a day (BID) | RECTAL | Status: AC

## 2016-04-16 NOTE — Progress Notes (Signed)
REVIEWED-NO ADDITIONAL RECOMMENDATIONS..   Subjective: No further rectal bleeding. Last BM yesterday. Denies abdominal pain. Feels more alert today.   Objective: Vital signs in last 24 hours: Temp:  [97.6 F (36.4 C)-98 F (36.7 C)] 97.9 F (36.6 C) (04/18 0500) Pulse Rate:  [67-78] 67 (04/18 0500) Resp:  [18] 18 (04/18 0500) BP: (90-142)/(60-79) 132/65 mmHg (04/18 0500) SpO2:  [98 %-100 %] 98 % (04/18 0500) Last BM Date: 04/13/16 General:   Alert and oriented, pleasant Head:  Normocephalic and atraumatic. Eyes:  No icterus, sclera clear. Conjuctiva pink.  Abdomen:  Bowel sounds present, soft, non-tender, non-distended. No HSM or hernias noted. No rebound or guarding. No masses appreciated . Neurologic:  Alert and oriented to person and place.  Psych:  Alert and cooperative. Normal mood and affect.  Intake/Output from previous day: 04/17 0701 - 04/18 0700 In: 850 [P.O.:840; I.V.:10] Out: 1300 [Urine:1300] Intake/Output this shift:    Lab Results:  Recent Labs  04/14/16 0625 04/15/16 0628  WBC  --  7.0  HGB 11.4* 12.4*  HCT 31.6* 33.5*  PLT  --  96*   BMET  Recent Labs  04/14/16 0625 04/15/16 0628  NA 138 141  K 4.1 3.8  CL 104 106  CO2 29 29  GLUCOSE 99 98  BUN 27* 18  CREATININE 1.27* 1.10  CALCIUM 8.4* 8.8*    Studies/Results: Ct Head Wo Contrast  04/14/2016  CLINICAL DATA:  70 year old male with new onset of altered mental status. Code stroke. EXAM: CT HEAD WITHOUT CONTRAST TECHNIQUE: Contiguous axial images were obtained from the base of the skull through the vertex without intravenous contrast. COMPARISON:  Brain MRI 04/11/2016.  Head CT 04/11/2016. FINDINGS: Mild cerebral atrophy. No acute intracranial abnormalities. Specifically, no evidence of acute intracranial hemorrhage, no definite findings of acute/subacute cerebral ischemia, no mass, mass effect, hydrocephalus or abnormal intra or extra-axial fluid collections. Visualized paranasal sinuses and  mastoids are generally well pneumatized. Small mucosal retention cyst or polyp in the posterior aspect of the left maxillary sinus incompletely visualized. No acute displaced skull fractures are identified. IMPRESSION: 1. No acute intracranial abnormalities. 2. Mild cerebral atrophy. These results will be called to the ordering clinician or representative by the Radiologist Assistant, and communication documented in the PACS or zVision Dashboard. Electronically Signed   By: Vinnie Langton M.D.   On: 04/14/2016 09:35    Assessment: 70 year old male admitted with mental status changes due to acute stroke. Prior GI history pertinent for stage IIA colon cancer s/p colectomy April 2016. Last colonoscopy in Oct 2016 with normal anastomosis, anal fissures, and he is due for surveillance in Oct 2017. Likely benign anorectal bleeding in setting of anticoagulation and straining. 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 Recommend EGD at time of colonoscopy in October. For now, supportive measures. Continue Pradaxa, as benefits outweigh the risks. No further overt GI bleeding. Hopeful discharge in near future. Will follow peripherally for now.    Plan: Continue Senokot daily and Miralax prn Anusol suppository BID Outpatient colonoscopy +/-EGD in October 2017 Outpatient follow-up May 21, 2016 already scheduled   Orvil Feil, ANP-BC [facility]    LOS: 4 days    04/16/2016, 7:59 AM

## 2016-04-16 NOTE — Progress Notes (Signed)
Offsite EEG completed at [facility], results pending. 

## 2016-04-16 NOTE — Progress Notes (Signed)
[place] A. [name], MD     [email or web address]          ALEKSANDAR DUVE is an 70 y.o. male.   Assessment/Plan:  ASSESSMENT/PLAN: Recurrent episodes of confusion of unclear etiology. We suspect that the patient could be having complex partial seizures.  Small lacunar infarct unlikely causing the patient's confusion.  Chronic atrial fibrillation on chronic anticoagulation.  Multifactorial gait impairment including osteoarthritis, aging and mild evidence of myelopathy.  Mild Parkinson disease.  RECOMMENDATION: EEG. Increase Keppra to 750 mg twice a day. Physical therapy. Continue with Parkinson's medication and also medications for atrial fibrillation/anticoagulation.  No recurrent spells are reported. The left for postdates she thinks that he actually is more alert today.   GENERAL: Pleasant in no acute distress.  HEENT: Supple. Atraumatic normocephalic.   ABDOMEN: soft  EXTREMITIES: No edema   BACK: Normal.  SKIN: Normal by inspection.   MENTAL STATUS: Alert and oriented. Speech, language and cognition are generally intact. Judgment and insight normal.   CRANIAL NERVES: Pupils are equal, round and reactive to light and accommodation; extra ocular movements are full, there is no significant nystagmus; visual fields are full; upper and lower facial muscles are normal in strength and symmetric, there is no flattening of the nasolabial folds; tongue is midline; uvula is midline; shoulder elevation is normal.  MOTOR: Normal tone, bulk and strength; no pronator drift.  COORDINATION: Left finger to nose is normal, right finger to nose is normal, No rest tremor; no intention tremor; no postural tremor; no bradykinesia.  REFLEXES: Deep tendon reflexes are symmetrical and normal. Babinski reflexes are flexor bilaterally.   SENSATION: Normal to light touch.     Objective: Vital signs in last 24 hours: Temp:  [97.6 F (36.4 C)-98 F (36.7  C)] 97.9 F (36.6 C) (04/18 0500) Pulse Rate:  [67-78] 67 (04/18 0500) Resp:  [18] 18 (04/18 0500) BP: (90-142)/(60-79) 132/65 mmHg (04/18 0500) SpO2:  [98 %-100 %] 98 % (04/18 0500)  Intake/Output from previous day: 04/17 0701 - 04/18 0700 In: 850 [P.O.:840; I.V.:10] Out: 1300 [Urine:1300] Intake/Output this shift:   Nutritional status: Diet Heart Room service appropriate?: Yes; Fluid consistency:: Thin   Lab Results: Results for orders placed or performed during the hospital encounter of 04/11/16 (from the past 48 hour(s))  Basic metabolic panel     Status: Abnormal   Collection Time: 04/15/16  6:28 AM  Result Value Ref Range   Sodium 141 135 - 145 mmol/L   Potassium 3.8 3.5 - 5.1 mmol/L   Chloride 106 101 - 111 mmol/L   CO2 29 22 - 32 mmol/L   Glucose, Bld 98 65 - 99 mg/dL   BUN 18 6 - 20 mg/dL   Creatinine, Ser 1.10 0.61 - 1.24 mg/dL   Calcium 8.8 (L) 8.9 - 10.3 mg/dL   GFR calc non Af Amer >60 >60 mL/min   GFR calc Af Amer >60 >60 mL/min    Comment: (NOTE) The eGFR has been calculated using the CKD EPI equation. This calculation has not been validated in all clinical situations. eGFR's persistently <60 mL/min signify possible Chronic Kidney Disease.    Anion gap 6 5 - 15  CBC     Status: Abnormal   Collection Time: 04/15/16  6:28 AM  Result Value Ref Range   WBC 7.0 4.0 - 10.5 K/uL   RBC 3.57 (L) 4.22 - 5.81 MIL/uL   Hemoglobin 12.4 (L) 13.0 - 17.0 g/dL   HCT 33.5 (L) 39.0 -  52.0 %   MCV 93.8 78.0 - 100.0 fL   MCH 34.7 (H) 26.0 - 34.0 pg   MCHC 37.0 (H) 30.0 - 36.0 g/dL   RDW 13.6 11.5 - 15.5 %   Platelets 96 (L) 150 - 400 K/uL    Comment: SPECIMEN CHECKED FOR CLOTS CONSISTENT WITH PREVIOUS RESULT     Lipid Panel No results for input(s): CHOL, TRIG, HDL, CHOLHDL, VLDL, LDLCALC in the last 72 hours.  Studies/Results:   Medications:  Scheduled Meds: . carbidopa-levodopa  1 tablet Oral Daily  . dabigatran  150 mg Oral BID  . escitalopram  20 mg Oral  QHS  . hydrocortisone  25 mg Rectal BID  . levETIRAcetam  500 mg Oral BID  . pantoprazole  40 mg Oral Daily  . polycarbophil  1,250 mg Oral BID  . polyethylene glycol  17 g Oral Daily  . senna-docusate  2 tablet Oral Q lunch  . sodium chloride flush  10-40 mL Intracatheter Q12H   Continuous Infusions:  PRN Meds:.senna-docusate, sodium chloride flush     LOS: 4 days   Jozy Mcphearson A. [name], M.D.  Diplomate, Tax adviser of Psychiatry and Neurology ( Neurology).

## 2016-04-16 NOTE — Care Management Note (Signed)
Case Management Note  Patient Details  Name: [name] MRN: [id number] Date of Birth: 07-14-1946   Expected Discharge Date:      04/16/2016            Expected Discharge Plan:  Home/Self Care  In-House Referral:  NA  Discharge planning Services  CM Consult, Homebound not met per provider  Post Acute Care Choice:  Resumption of Svcs/PTA Provider Choice offered to:     DME Arranged:    DME Agency:     HH Arranged:    [place] Agency:     Status of Service:  Completed, signed off  Medicare Important Message Given:  Yes Date Medicare IM Given:    Medicare IM give by:    Date Additional Medicare IM Given:    Additional Medicare Important Message give by:     If discussed at [address] of Stay Meetings, dates discussed:    Additional Comments: Pt discharging home with self care and resumption of OP PT.   Sherald Barge, RN 04/16/2016, 1:14 PM

## 2016-04-16 NOTE — Progress Notes (Signed)
Discharge instructions given to wife who verbalized understanding, out in stable condition via w/c with staff. 

## 2016-04-16 NOTE — Consult Note (Signed)
   [facility] Inpatient Consult   04/16/2016  DONAVAN KERLIN November 17, 1946 [id number]   Met with patient, wife and son at bedside to offer restart of [place] program services. Patient and wife appreciate services in the past, but indicate they do not feel they want to restart program at this time. RNCM reminded them that patient is eligible for program and they could call our office for services if they change their mind in the future. They voiced appreciation. Of note, [facility] Management services would not replace or interfere with any services that are arranged by inpatient case management or social work. For additional questions or referrals please contact:  Royetta Crochet. Laymond Purser, RN, BSN, [facility] Liaison [phone]

## 2016-04-16 NOTE — Progress Notes (Signed)
Subjective: He feels better. His wife says he is back almost to baseline. Dr. Freddie Apley help is noted and appreciated  Objective: Vital signs in last 24 hours: Temp:  [97.6 F (36.4 C)-98 F (36.7 C)] 97.9 F (36.6 C) (04/18 0500) Pulse Rate:  [67-78] 67 (04/18 0500) Resp:  [18] 18 (04/18 0500) BP: (90-142)/(60-79) 132/65 mmHg (04/18 0500) SpO2:  [98 %-100 %] 98 % (04/18 0500) Weight change:  Last BM Date: 04/13/16  Intake/Output from previous day: 04/17 0701 - 04/18 0700 In: 850 [P.O.:840; I.V.:10] Out: 1300 [Urine:1300]  PHYSICAL EXAM General appearance: alert, cooperative, no distress and morbidly obese Resp: clear to auscultation bilaterally Cardio: irregularly irregular rhythm GI: soft, non-tender; bowel sounds normal; no masses,  no organomegaly Extremities: venous stasis dermatitis noted  Lab Results:  Results for orders placed or performed during the hospital encounter of 04/11/16 (from the past 48 hour(s))  Basic metabolic panel     Status: Abnormal   Collection Time: 04/15/16  6:28 AM  Result Value Ref Range   Sodium 141 135 - 145 mmol/L   Potassium 3.8 3.5 - 5.1 mmol/L   Chloride 106 101 - 111 mmol/L   CO2 29 22 - 32 mmol/L   Glucose, Bld 98 65 - 99 mg/dL   BUN 18 6 - 20 mg/dL   Creatinine, Ser 1.10 0.61 - 1.24 mg/dL   Calcium 8.8 (L) 8.9 - 10.3 mg/dL   GFR calc non Af Amer >60 >60 mL/min   GFR calc Af Amer >60 >60 mL/min    Comment: (NOTE) The eGFR has been calculated using the CKD EPI equation. This calculation has not been validated in all clinical situations. eGFR's persistently <60 mL/min signify possible Chronic Kidney Disease.    Anion gap 6 5 - 15  CBC     Status: Abnormal   Collection Time: 04/15/16  6:28 AM  Result Value Ref Range   WBC 7.0 4.0 - 10.5 K/uL   RBC 3.57 (L) 4.22 - 5.81 MIL/uL   Hemoglobin 12.4 (L) 13.0 - 17.0 g/dL   HCT 33.5 (L) 39.0 - 52.0 %   MCV 93.8 78.0 - 100.0 fL   MCH 34.7 (H) 26.0 - 34.0 pg   MCHC 37.0 (H) 30.0 -  36.0 g/dL   RDW 13.6 11.5 - 15.5 %   Platelets 96 (L) 150 - 400 K/uL    Comment: SPECIMEN CHECKED FOR CLOTS CONSISTENT WITH PREVIOUS RESULT     ABGS No results for input(s): PHART, PO2ART, TCO2, HCO3 in the last 72 hours.  Invalid input(s): PCO2 CULTURES No results found for this or any previous visit (from the past 240 hour(s)). Studies/Results: Ct Head Wo Contrast  04/14/2016  CLINICAL DATA:  70 year old male with new onset of altered mental status. Code stroke. EXAM: CT HEAD WITHOUT CONTRAST TECHNIQUE: Contiguous axial images were obtained from the base of the skull through the vertex without intravenous contrast. COMPARISON:  Brain MRI 04/11/2016.  Head CT 04/11/2016. FINDINGS: Mild cerebral atrophy. No acute intracranial abnormalities. Specifically, no evidence of acute intracranial hemorrhage, no definite findings of acute/subacute cerebral ischemia, no mass, mass effect, hydrocephalus or abnormal intra or extra-axial fluid collections. Visualized paranasal sinuses and mastoids are generally well pneumatized. Small mucosal retention cyst or polyp in the posterior aspect of the left maxillary sinus incompletely visualized. No acute displaced skull fractures are identified. IMPRESSION: 1. No acute intracranial abnormalities. 2. Mild cerebral atrophy. These results will be called to the ordering clinician or representative by the Radiologist Assistant,  and communication documented in the PACS or zVision Dashboard. Electronically Signed   By: Vinnie Langton M.D.   On: 04/14/2016 09:35    Medications:  Prior to Admission:  Prescriptions prior to admission  Medication Sig Dispense Refill Last Dose  . albuterol (PROVENTIL HFA;VENTOLIN HFA) 108 (90 Base) MCG/ACT inhaler Inhale 1-2 puffs into the lungs every 6 (six) hours as needed for wheezing or shortness of breath. Reported on 02/20/2016   unknown  . bumetanide (BUMEX) 2 MG tablet Take 2 mg by mouth 2 (two) times daily.    04/11/2016 at  Unknown time  . carbidopa-levodopa (SINEMET IR) 25-100 MG tablet Take 1 tablet by mouth daily. Take 1 tablet once daily x1 week, 1 tablet twice daily x1 week, 1 tablet three times daily.   04/11/2016 at 840a  . dabigatran (PRADAXA) 150 MG CAPS capsule Take 150 mg by mouth 2 (two) times daily.   04/11/2016 at 840  . dicyclomine (BENTYL) 10 MG capsule Take 1 capsule (10 mg total) by mouth 2 (two) times daily. 1 po 30 MINS BEFORE BREAKFAST AND MAY REPEAT BEFORE LUNCH. 60 capsule 11 04/11/2016 at Unknown time  . escitalopram (LEXAPRO) 20 MG tablet Take 1 tablet (20 mg total) by mouth daily. 30 tablet 5 04/10/2016 at Unknown time  . levETIRAcetam (KEPPRA) 500 MG tablet Take 1 tablet (500 mg total) by mouth 2 (two) times daily. 60 tablet 12 04/11/2016 at 840a  . meclizine (ANTIVERT) 25 MG tablet Take 1 tablet (25 mg total) by mouth 3 (three) times daily as needed for dizziness. 30 tablet 0 unknown  . metolazone (ZAROXOLYN) 2.5 MG tablet Take 2.5 mg by mouth every Thursday.   04/11/2016 at Unknown time  . nitroGLYCERIN (NITROGLYN) 2 % ointment Apply 0.5 inches topically as needed for chest pain. Reported on 02/20/2016   unknown  . omeprazole (PRILOSEC) 20 MG capsule Take 1 capsule (20 mg total) by mouth daily. 30 capsule 3 04/11/2016 at Unknown time  . potassium chloride SA (K-DUR,KLOR-CON) 20 MEQ tablet Take 1 tablet (20 mEq total) by mouth 2 (two) times daily. 90 tablet 3 04/11/2016 at Unknown time  . psyllium (METAMUCIL) 58.6 % packet Take 1 packet by mouth daily.   04/11/2016 at Unknown time  . spironolactone (ALDACTONE) 25 MG tablet Take 0.5 tablets (12.5 mg total) by mouth 2 (two) times daily. 60 tablet 5 04/11/2016 at Unknown time  . losartan (COZAAR) 50 MG tablet Take 1 tablet (50 mg total) by mouth daily. (Patient not taking: Reported on 04/11/2016) 90 tablet 3 131m??   Scheduled: . carbidopa-levodopa  1 tablet Oral Daily  . dabigatran  150 mg Oral BID  . escitalopram  20 mg Oral QHS  . hydrocortisone  25  mg Rectal BID  . levETIRAcetam  500 mg Oral BID  . pantoprazole  40 mg Oral Daily  . polycarbophil  1,250 mg Oral BID  . polyethylene glycol  17 g Oral Daily  . senna-docusate  2 tablet Oral Q lunch  . sodium chloride flush  10-40 mL Intracatheter Q12H   Continuous:  PWUJ:WJXBJYNlock flush, senna-docusate, sodium chloride flush  Assesment: He was admitted with what was thought to be a stroke. Dr. DFreddie Apleynote is seen and it does not appear that he has had an acute stroke. 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 There is a question as to whether he has some form of dementia perhaps related to his Parkinson's disease because his wife says he's been very confused at  home since October.  He has multiple other medical problems including atrial fibrillation on anticoagulation  He has poor balance  He has a history of colon cancer  He has Parkinson's disease  He had GI bleeding that appears to be relatively minor Active Problems:   Colon carcinoma (HCC)   CVA (cerebral infarction)   Stroke [facility])   Confusion   Acute CVA (cerebrovascular accident) ([place])   Rectal bleeding    Plan: He is much better and I think can be discharged home now.    LOS: 4 days   Awa Bachicha L 04/16/2016, 9:03 AM

## 2016-04-18 ENCOUNTER — Ambulatory Visit (HOSPITAL_COMMUNITY): Payer: Medicare Other | Admitting: Physical Therapy

## 2016-04-18 ENCOUNTER — Telehealth (HOSPITAL_COMMUNITY): Payer: Self-pay

## 2016-04-18 NOTE — Telephone Encounter (Signed)
04/18/16 wife cx appt - said he was acting strange again and taking him to his dr.

## 2016-04-18 NOTE — Discharge Summary (Signed)
Physician Discharge Summary  Patient ID: NEHEMIAH SNARR MRN: [id number] DOB/AGE: 70/18/1947 70 y.o. Primary Care Physician:Gillermo Poch L, MD Admit date: 04/11/2016 Discharge date: 04/18/2016    Discharge Diagnoses:   Active Problems:   Colon carcinoma (HCC)   Atrial fibrillation (HCC)   Altered mental status   Cirrhosis (HCC)   Chronic anticoagulation   Stroke (HCC)   Confusion   Rectal bleeding  sleep apnea COPD Congestive heart failure    Medication List    STOP taking these medications        losartan 50 MG tablet  Commonly known as:  COZAAR      TAKE these medications        albuterol 108 (90 Base) MCG/ACT inhaler  Commonly known as:  PROVENTIL HFA;VENTOLIN HFA  Inhale 1-2 puffs into the lungs every 6 (six) hours as needed for wheezing or shortness of breath. Reported on 02/20/2016     bumetanide 2 MG tablet  Commonly known as:  BUMEX  Take 2 mg by mouth 2 (two) times daily.     carbidopa-levodopa 25-100 MG tablet  Commonly known as:  SINEMET IR  Take 1 tablet by mouth daily. Take 1 tablet once daily x1 week, 1 tablet twice daily x1 week, 1 tablet three times daily.     dabigatran 150 MG Caps capsule  Commonly known as:  PRADAXA  Take 150 mg by mouth 2 (two) times daily.     dicyclomine 10 MG capsule  Commonly known as:  BENTYL  Take 1 capsule (10 mg total) by mouth 2 (two) times daily. 1 po 30 MINS BEFORE BREAKFAST AND MAY REPEAT BEFORE LUNCH.     escitalopram 20 MG tablet  Commonly known as:  LEXAPRO  Take 1 tablet (20 mg total) by mouth daily.     hydrocortisone 25 MG suppository  Commonly known as:  ANUSOL-HC  Place 1 suppository (25 mg total) rectally 2 (two) times daily.     levETIRAcetam 500 MG tablet  Commonly known as:  KEPPRA  Take 2 tablets (1,000 mg total) by mouth 2 (two) times daily.     meclizine 25 MG tablet  Commonly known as:  ANTIVERT  Take 1 tablet (25 mg total) by mouth 3 (three) times daily as needed for dizziness.      metolazone 2.5 MG tablet  Commonly known as:  ZAROXOLYN  Take 2.5 mg by mouth every Thursday.     nitroGLYCERIN 2 % ointment  Commonly known as:  NITROGLYN  Apply 0.5 inches topically as needed for chest pain. Reported on 02/20/2016     omeprazole 20 MG capsule  Commonly known as:  PRILOSEC  Take 1 capsule (20 mg total) by mouth daily.     potassium chloride SA 20 MEQ tablet  Commonly known as:  K-DUR,KLOR-CON  Take 1 tablet (20 mEq total) by mouth 2 (two) times daily.     psyllium 58.6 % packet  Commonly known as:  METAMUCIL  Take 1 packet by mouth daily.     spironolactone 25 MG tablet  Commonly known as:  ALDACTONE  Take 0.5 tablets (12.5 mg total) by mouth 2 (two) times daily.        Discharged Condition: Improved    Consults: This is a 70 year old who came to the emergency room with altered mental status. It was not totally clear what happened. There is concern that he may be having atypical seizures. He had seizures previously but with those he had a prolonged post ictal  state. He has multiple other medical problems as listed. His mental status improved. He had adjustment in his medications. He had cognitive testing which was not normal and his wife says that he's had cognitive issues for the last 6 months or so but neurology consult and felt like in the face of altered mental status from seizures this was not accurate. This will be repeated as an outpatient. By the time of discharge she was improved although still not quite back to baseline.  Significant Diagnostic Studies: Dg Chest 2 View  04/11/2016  CLINICAL DATA:  Altered mental status/confusion EXAM: CHEST  2 VIEW COMPARISON:  March 15, 2016 FINDINGS: Port-A-Cath tip is at the junction of the left innominate vein and superior vena cava. No pneumothorax. There is no edema or consolidation. Heart size and pulmonary vascularity are normal. No adenopathy. There is degenerative change in the lower thoracic and visualized  lumbar regions. There is slight anterior wedging of the L1 vertebral body, stable. IMPRESSION: No edema or consolidation. Stable cardiac silhouette. Port-A-Cath tip at the junction of the left innominate vein and superior vena cava. No pneumothorax. Electronically Signed   By: Lowella Grip III M.D.   On: 04/11/2016 17:16   Ct Head Wo Contrast  04/14/2016  CLINICAL DATA:  70 year old male with new onset of altered mental status. Code stroke. EXAM: CT HEAD WITHOUT CONTRAST TECHNIQUE: Contiguous axial images were obtained from the base of the skull through the vertex without intravenous contrast. COMPARISON:  Brain MRI 04/11/2016.  Head CT 04/11/2016. FINDINGS: Mild cerebral atrophy. No acute intracranial abnormalities. Specifically, no evidence of acute intracranial hemorrhage, no definite findings of acute/subacute cerebral ischemia, no mass, mass effect, hydrocephalus or abnormal intra or extra-axial fluid collections. Visualized paranasal sinuses and mastoids are generally well pneumatized. Small mucosal retention cyst or polyp in the posterior aspect of the left maxillary sinus incompletely visualized. No acute displaced skull fractures are identified. IMPRESSION: 1. No acute intracranial abnormalities. 2. Mild cerebral atrophy. These results will be called to the ordering clinician or representative by the Radiologist Assistant, and communication documented in the PACS or zVision Dashboard. Electronically Signed   By: Vinnie Langton M.D.   On: 04/14/2016 09:35   Ct Head Wo Contrast  04/11/2016  CLINICAL DATA:  Weakness, altered mental status, and confusion today, history colon cancer, skin cancer, hypertension, multiple falls in past month EXAM: CT HEAD WITHOUT CONTRAST TECHNIQUE: Contiguous axial images were obtained from the base of the skull through the vertex without intravenous contrast. COMPARISON:  10/19/2015 FINDINGS: Generalized atrophy. Normal ventricular morphology. No midline shift or  mass effect. Otherwise normal appearance of brain parenchyma. No intracranial hemorrhage, mass lesion or evidence acute infarction. No extra-axial fluid collections. Mucosal retention cyst LEFT maxillary sinus. Minimal fluid in a few RIGHT mastoid air cells. No acute bone or sinus abnormalities otherwise identified. IMPRESSION: Generalized atrophy. No acute intracranial abnormalities. Electronically Signed   By: Lavonia Dana M.D.   On: 04/11/2016 17:48   Mr Brain Wo Contrast  04/11/2016  CLINICAL DATA:  70 year old male with weakness, confusion, multiple recent falls. Initial encounter. EXAM: MRI HEAD WITHOUT CONTRAST TECHNIQUE: Multiplanar, multiecho pulse sequences of the brain and surrounding structures were obtained without intravenous contrast. COMPARISON:  Head CT 1739 hours today.  Brain MRI 10/20/2015. FINDINGS: Major intracranial vascular flow voids are stable, with chronic abnormal appearance of the bilateral distal vertebral arteries, although as before there appears to be reconstituted flow in the basilar artery. There is a subtle punctate area  of restricted diffusion in the right paracentral lower midbrain seen on both series 100, image 15 and series 101, image 28. No associated T2 or FLAIR hyperintensity. No associated hemorrhage or mass effect. No restricted diffusion elsewhere. No midline shift, mass effect, evidence of mass lesion, ventriculomegaly, extra-axial collection or acute intracranial hemorrhage. Cervicomedullary junction and pituitary are within normal limits. Negative visualized cervical spine. Stable gray and white matter signal, largely unremarkable for age throughout the brain. No cortical encephalomalacia or chronic cerebral blood products. Visible internal auditory structures appear normal. Stable mild mastoid effusions. New mucosal thickening or mucous retention cyst in the left maxillary sinus. Stable mild ethmoid mucosal thickening. Orbit and scalp soft tissues are negative.  Visualized bone marrow signal is within normal limits. IMPRESSION: 1. Suggestion of a tiny acute lacunar infarct in the right midbrain. 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 2. No associated hemorrhage or mass effect. No other acute intracranial abnormality. Electronically Signed   By: Genevie Ann M.D.   On: 04/11/2016 19:11   US Carotid Bilateral  04/12/2016  CLINICAL DATA:  CVA. EXAM: BILATERAL CAROTID DUPLEX ULTRASOUND TECHNIQUE: Pearline Cables scale imaging, color Doppler and duplex ultrasound were performed of bilateral carotid and vertebral arteries in the neck. COMPARISON:  MRI 04/11/2016. FINDINGS: Criteria: Quantification of carotid stenosis is based on velocity parameters that correlate the residual internal carotid diameter with NASCET-based stenosis levels, using the diameter of the distal internal carotid lumen as the denominator for stenosis measurement. The following velocity measurements were obtained: RIGHT ICA:  80/32 cm/sec CCA:  [id number] cm/sec SYSTOLIC ICA/CCA RATIO:  1.2 DIASTOLIC ICA/CCA RATIO:  1.8 ECA:  72 cm/sec LEFT ICA:  86/21 cm/sec CCA:  [id number] 80 cm/sec SYSTOLIC ICA/CCA RATIO:  0.9 DIASTOLIC ICA/CCA RATIO:  0.8 ECA:  75 cm/sec RIGHT CAROTID ARTERY: Mild right carotid bifurcation plaque. No flow limiting stenosis. RIGHT VERTEBRAL ARTERY:  Patent with antegrade flow. LEFT CAROTID ARTERY: Mild plaque left carotid bifurcation. No flow limiting stenosis. LEFT VERTEBRAL ARTERY:  Patent with retrograde flow. IMPRESSION: 1. 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 No flow limiting stenosis. Degree of stenosis less than 50%. 2. Right vertebral is patent antegrade flow. Left vertebral is patent left with retrograde flow. Subclavian steal cannot be excluded. Electronically Signed   By: Marcello Moores  Register   On: 04/12/2016 12:37   Mr Jodene Nam Head/brain Wo Cm  04/11/2016  CLINICAL DATA:  Followup infarct. Air history of hypertension, Parkinson's disease and  colon cancer. EXAM: MRA HEAD WITHOUT CONTRAST TECHNIQUE: Angiographic images of the Circle of Willis were obtained using MRA technique without intravenous contrast. COMPARISON:  MRI of the brain May 11, 2016 at 1832 hours FINDINGS: Anterior circulation: Normal flow related enhancement of the included cervical, petrous, cavernous and supraclinoid internal carotid arteries. Patent anterior communicating artery. Normal flow related enhancement of the anterior and middle cerebral arteries, limited evaluation of mid to distal segments on mid images due to technique though, normal on raw data. No large vessel occlusion, high-grade stenosis, abnormal luminal irregularity, aneurysm. Posterior circulation: Codominant vertebral arteries. Basilar artery is patent, with normal flow related enhancement of the main branch vessels. Normal flow related enhancement of the posterior cerebral arteries. Fetal origin bilateral posterior cerebral arteries. Limited evaluation of mid to distal segments on mid images due to technique though, normal on raw data. No large vessel occlusion, high-grade stenosis, abnormal luminal irregularity, aneurysm. IMPRESSION: Negative MRA head. Electronically Signed   By: Elon Alas M.D.   On: 04/11/2016 22:23  Lab Results: Basic Metabolic Panel: No results for input(s): NA, K, CL, CO2, GLUCOSE, BUN, CREATININE, CALCIUM, MG, PHOS in the last 72 hours. Liver Function Tests: No results for input(s): AST, ALT, ALKPHOS, BILITOT, PROT, ALBUMIN in the last 72 hours.   CBC: No results for input(s): WBC, NEUTROABS, HGB, HCT, MCV, PLT in the last 72 hours.  No results found for this or any previous visit (from the past 240 hour(s)).   Hospital Course: Please see above  Discharge Exam: Blood pressure 132/65, pulse 67, temperature 97.9 F (36.6 C), temperature source Oral, resp. rate 18, height 5\' 7"  (1.702 m), weight 108.455 kg (239 lb 1.6 oz), SpO2 98 %. He is awake and alert. He is  morbidly obese. He is in atrial fibrillation.   Disposition: Home he will follow in the next week or so with neurology he will need an EEG.      Discharge Instructions    Discharge patient    Complete by:  As directed            Follow-up Information    Follow up with Nahia Nissan L, MD On 04/30/2016.   Specialty:  Pulmonary Disease   Why:  at 9:00 am   Contact information:   [place] [address] [phone]       Signed: Teosha Casso L   04/18/2016, 8:32 AM

## 2016-04-20 NOTE — Discharge Summary (Signed)
NAME:  [name], [name] NO.:  [id number]  MEDICAL RECORD NO.:  [id number]  LOCATION:  [id number]                          FACILITY:  [facility]  PHYSICIAN:  Ras Kollman L. Luan Pulling, M.D.DATE OF BIRTH:  Apr 16, 1946  DATE OF ADMISSION:  04/11/2016 DATE OF DISCHARGE:  04/18/2017LH                              DISCHARGE SUMMARY   ADDENDUM:  DISCHARGE DIAGNOSES:  Altered mental status related to seizures.     Violeta Lecount L. Luan Pulling, M.D.     EL[facility]/MEDQ  D:  04/19/2016  T:  04/19/2016  Job:  [id number]

## 2016-04-23 ENCOUNTER — Ambulatory Visit (HOSPITAL_COMMUNITY): Payer: Medicare Other | Admitting: Physical Therapy

## 2016-04-23 ENCOUNTER — Telehealth (HOSPITAL_COMMUNITY): Payer: Self-pay

## 2016-04-23 NOTE — Telephone Encounter (Signed)
04/23/16 wife cx today because she said it was getting hard to get him in the car.  He is unbalanced and since the last stroke he's just different.  I suggested that she call Dr. [name] and talk to him about perhaps receiving home health.

## 2016-04-23 NOTE — Procedures (Signed)
  [place] A. [name], MD     [email or web address]           HISTORY: The patient is 70 year old who presents with recurrent episodes of confusion suspicious for complex partial seizures.  MEDICATIONS: Scheduled Meds: Continuous Infusions: PRN Meds:.  Prior to Admission medications   Medication Sig Start Date End Date Taking? Authorizing Provider  albuterol (PROVENTIL HFA;VENTOLIN HFA) 108 (90 Base) MCG/ACT inhaler Inhale 1-2 puffs into the lungs every 6 (six) hours as needed for wheezing or shortness of breath. Reported on 02/20/2016    Historical Provider, MD  bumetanide (BUMEX) 2 MG tablet Take 2 mg by mouth 2 (two) times daily.     Historical Provider, MD  carbidopa-levodopa (SINEMET IR) 25-100 MG tablet Take 1 tablet by mouth daily. Take 1 tablet once daily x1 week, 1 tablet twice daily x1 week, 1 tablet three times daily. 03/13/16   Historical Provider, MD  dabigatran (PRADAXA) 150 MG CAPS capsule Take 150 mg by mouth 2 (two) times daily.    Historical Provider, MD  dicyclomine (BENTYL) 10 MG capsule Take 1 capsule (10 mg total) by mouth 2 (two) times daily. 1 po 30 MINS BEFORE BREAKFAST AND MAY REPEAT BEFORE LUNCH. 03/22/16   [name], MD  escitalopram (LEXAPRO) 20 MG tablet Take 1 tablet (20 mg total) by mouth daily. 01/02/16   Baird Cancer, PA-C  hydrocortisone (ANUSOL-HC) 25 MG suppository Place 1 suppository (25 mg total) rectally 2 (two) times daily. 04/16/16   [name], MD  levETIRAcetam (KEPPRA) 500 MG tablet Take 2 tablets (1,000 mg total) by mouth 2 (two) times daily. 04/16/16   [name], MD  meclizine (ANTIVERT) 25 MG tablet Take 1 tablet (25 mg total) by mouth 3 (three) times daily as needed for dizziness. 03/22/16   [name], MD  metolazone (ZAROXOLYN) 2.5 MG tablet Take 2.5 mg by mouth every Thursday.    Historical Provider, MD  nitroGLYCERIN (NITROGLYN) 2 % ointment Apply 0.5 inches topically as needed for chest pain. Reported on  02/20/2016    Historical Provider, MD  omeprazole (PRILOSEC) 20 MG capsule Take 1 capsule (20 mg total) by mouth daily. 03/15/15   [name], PA-C  potassium chloride SA (K-DUR,KLOR-CON) 20 MEQ tablet Take 1 tablet (20 mEq total) by mouth 2 (two) times daily. 03/22/16   [name], MD  psyllium (METAMUCIL) 58.6 % packet Take 1 packet by mouth daily.    Historical Provider, MD  spironolactone (ALDACTONE) 25 MG tablet Take 0.5 tablets (12.5 mg total) by mouth 2 (two) times daily. 03/22/16   [name], MD      ANALYSIS: A 16 channel recording using standard 10 20 measurements is conducted for 21 minutes.  There is a posterior dominant rhythm of 5 Hz. There are infrequent triphasic waves and the frontal intermittent rhythmic rhythmic delta activities. Vertex sharp wave also observed. Photic simulation is carried out without abnormal changes in the background activity. Hyperventilation is not done. There is no focal or lateralized slowing. There is no epileptiform activities are observed.   IMPRESSION: This recording is abnormal for the following reasons: 1. 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 2. Infrequent triphasic waves and the frontal intermittent rhythmic delta activity. These are typically seen in metabolic encephalopathies.       Siyana Erney A. [name], M.D.  Diplomate, Tax adviser of Psychiatry and Neurology ( Neurology).

## 2016-04-24 ENCOUNTER — Emergency Department (HOSPITAL_COMMUNITY): Payer: Medicare Other

## 2016-04-24 ENCOUNTER — Encounter (HOSPITAL_COMMUNITY): Payer: Self-pay

## 2016-04-24 ENCOUNTER — Observation Stay (HOSPITAL_COMMUNITY)
Admission: EM | Admit: 2016-04-24 | Discharge: 2016-04-29 | Disposition: E | Payer: Medicare Other | Attending: Pulmonary Disease | Admitting: Pulmonary Disease

## 2016-04-24 DIAGNOSIS — R531 Weakness: Secondary | ICD-10-CM

## 2016-04-24 DIAGNOSIS — I10 Essential (primary) hypertension: Secondary | ICD-10-CM | POA: Diagnosis not present

## 2016-04-24 DIAGNOSIS — I5033 Acute on chronic diastolic (congestive) heart failure: Secondary | ICD-10-CM | POA: Diagnosis present

## 2016-04-24 DIAGNOSIS — Z7901 Long term (current) use of anticoagulants: Secondary | ICD-10-CM | POA: Diagnosis not present

## 2016-04-24 DIAGNOSIS — K922 Gastrointestinal hemorrhage, unspecified: Secondary | ICD-10-CM | POA: Diagnosis not present

## 2016-04-24 DIAGNOSIS — K625 Hemorrhage of anus and rectum: Secondary | ICD-10-CM

## 2016-04-24 DIAGNOSIS — G20 Parkinson's disease: Secondary | ICD-10-CM

## 2016-04-24 DIAGNOSIS — R42 Dizziness and giddiness: Secondary | ICD-10-CM | POA: Diagnosis not present

## 2016-04-24 DIAGNOSIS — G20A1 Parkinson's disease without dyskinesia, without mention of fluctuations: Secondary | ICD-10-CM

## 2016-04-24 DIAGNOSIS — E876 Hypokalemia: Secondary | ICD-10-CM | POA: Diagnosis not present

## 2016-04-24 DIAGNOSIS — Z79899 Other long term (current) drug therapy: Secondary | ICD-10-CM | POA: Insufficient documentation

## 2016-04-24 DIAGNOSIS — I4891 Unspecified atrial fibrillation: Secondary | ICD-10-CM | POA: Diagnosis present

## 2016-04-24 DIAGNOSIS — Z85038 Personal history of other malignant neoplasm of large intestine: Secondary | ICD-10-CM | POA: Insufficient documentation

## 2016-04-24 HISTORY — DX: Personal history of other diseases of the digestive system: Z87.19

## 2016-04-24 LAB — CBC WITH DIFFERENTIAL/PLATELET
BASOS ABS: 0 10*3/uL (ref 0.0–0.1)
BASOS PCT: 0 %
EOS ABS: 0.1 10*3/uL (ref 0.0–0.7)
Eosinophils Relative: 1 %
HEMATOCRIT: 40.7 % (ref 39.0–52.0)
Hemoglobin: 15.4 g/dL (ref 13.0–17.0)
Lymphocytes Relative: 14 %
Lymphs Abs: 1.4 10*3/uL (ref 0.7–4.0)
MCH: 34.7 pg — ABNORMAL HIGH (ref 26.0–34.0)
MCHC: 37.8 g/dL — ABNORMAL HIGH (ref 30.0–36.0)
MCV: 91.7 fL (ref 78.0–100.0)
MONO ABS: 1 10*3/uL (ref 0.1–1.0)
Monocytes Relative: 10 %
NEUTROS ABS: 7.5 10*3/uL (ref 1.7–7.7)
Neutrophils Relative %: 75 %
PLATELETS: 133 10*3/uL — AB (ref 150–400)
RBC: 4.44 MIL/uL (ref 4.22–5.81)
RDW: 13.4 % (ref 11.5–15.5)
WBC: 10 10*3/uL (ref 4.0–10.5)

## 2016-04-24 LAB — COMPREHENSIVE METABOLIC PANEL
ALBUMIN: 3.7 g/dL (ref 3.5–5.0)
ALT: 15 U/L — ABNORMAL LOW (ref 17–63)
AST: 36 U/L (ref 15–41)
Alkaline Phosphatase: 107 U/L (ref 38–126)
Anion gap: 19 — ABNORMAL HIGH (ref 5–15)
BILIRUBIN TOTAL: 2.1 mg/dL — AB (ref 0.3–1.2)
BUN: 31 mg/dL — AB (ref 6–20)
CHLORIDE: 85 mmol/L — AB (ref 101–111)
CO2: 29 mmol/L (ref 22–32)
Calcium: 9.6 mg/dL (ref 8.9–10.3)
Creatinine, Ser: 1.49 mg/dL — ABNORMAL HIGH (ref 0.61–1.24)
GFR calc Af Amer: 53 mL/min — ABNORMAL LOW (ref >60)
GFR calc non Af Amer: 46 mL/min — ABNORMAL LOW (ref >60)
GLUCOSE: 223 mg/dL — AB (ref 65–99)
POTASSIUM: 2.5 mmol/L — AB (ref 3.5–5.1)
SODIUM: 133 mmol/L — AB (ref 135–145)
Total Protein: 8.2 g/dL — ABNORMAL HIGH (ref 6.5–8.1)

## 2016-04-24 LAB — TYPE AND SCREEN
ABO/RH(D): B NEG
Antibody Screen: NEGATIVE

## 2016-04-24 LAB — URINALYSIS, ROUTINE W REFLEX MICROSCOPIC
BILIRUBIN URINE: NEGATIVE
Glucose, UA: NEGATIVE mg/dL
Hgb urine dipstick: NEGATIVE
KETONES UR: NEGATIVE mg/dL
LEUKOCYTES UA: NEGATIVE
NITRITE: NEGATIVE
PROTEIN: NEGATIVE mg/dL
Specific Gravity, Urine: 1.01 (ref 1.005–1.030)
pH: 6 (ref 5.0–8.0)

## 2016-04-24 LAB — I-STAT TROPONIN, ED: Troponin i, poc: 0.02 ng/mL (ref 0.00–0.08)

## 2016-04-24 LAB — APTT: aPTT: 61 seconds — ABNORMAL HIGH (ref 24–37)

## 2016-04-24 LAB — PROTIME-INR
INR: 1.84 — AB (ref 0.00–1.49)
PROTHROMBIN TIME: 21.2 s — AB (ref 11.6–15.2)

## 2016-04-24 LAB — LIPASE, BLOOD: Lipase: 26 U/L (ref 11–51)

## 2016-04-24 MED ORDER — SODIUM CHLORIDE 0.9 % IV BOLUS (SEPSIS)
500.0000 mL | Freq: Once | INTRAVENOUS | Status: AC
  Administered 2016-04-24: 500 mL via INTRAVENOUS

## 2016-04-24 MED ORDER — POTASSIUM CHLORIDE 10 MEQ/100ML IV SOLN
10.0000 meq | INTRAVENOUS | Status: AC
  Administered 2016-04-24 – 2016-04-25 (×2): 10 meq via INTRAVENOUS
  Filled 2016-04-24 (×3): qty 100

## 2016-04-24 NOTE — ED Notes (Signed)
CRITICAL VALUE ALERT  Critical value received:  Potassium 2.5  Date of notification:  04/04/2016  Time of notification:  2220  Critical value read back:Yes.    Nurse who received alert:  [id number]  MD notified (1st page):  Lita Mains  Time of first page:  2220  MD notified (2nd page):  Time of second page:  Responding MD:    Time MD responded:

## 2016-04-24 NOTE — H&P (Signed)
History and Physical    [name] [id number] DOB: 06/28/1946 DOA: 04/07/2016  Referring Beasley/NP/PA: Julianne Rice, Beasley  PCP: Peter Bogus, Beasley  Outpatient Specialists: Gastroenterology, [name].  Patient coming from: Home via POV   Chief Complaint: AMS and rectal bleeding   HPI: [name] is a 70 y.o. male with medical history significant of colon cancer, atrial fibrillation anticoagulated with Pradaxa, and stroke with residual right-sided weakness presented with his wife bedside who reports rectal bleeding and altered mental status. He had intermittent confusion in the past, and was felt that it may have been complex seizure per Dr [name].  When I saw him, he was not confused.   She states the bleeding onset today and has progressively worsened. She denied any black stools Bleeding initally was only noticeable when wiping rectal area during his last admission, and GI had seen him, suggesting at that time to defer colonsocopy.  He had TCS in Oct 2016 (Dr Oneida Beasley), which showed verrucous lesion on anus, anal fissure, and normal anastomosis. His confusion is intermittent and appear resolved until he arrived to the ED.    ED Course: Found to be hypokalemic with potassium 2.5; Sodium 133; Chloride 85; Creatinine 1.49; Hgb stable, 15.4; CT Head unremarkable. Rectal exam performed by EDP was positive for gross red blood. Per records patient was seen 10 days ago for rectal bleeding for which he was evaluated by GI who recommended that he continued Pradaxa and conservative treatment. Colonoscopy 09/2015 was unremarkable, with recommendations for follow up colonoscopy in one year. He has been referred for admission for further treatment.   Review of Systems: As per HPI otherwise 10 point review of systems negative.    Past Medical History  Diagnosis Date  . Varicose veins   . New onset atrial fibrillation ([place]) 01/02/2015  . Morbid obesity ([place]) 01/02/2015  . Hypertension   .  Sleep apnea     been tested but has not received the CPAP yet  . GERD (gastroesophageal reflux disease)   . History of gout   . Family history of colon cancer   . Family history of breast cancer in mother   . Parkinson disease ([place])   . Cancer ([place]) 02/2015    colon  . Colon cancer [facility])     March 2016  . History of anal fissures     Past Surgical History  Procedure Laterality Date  . Vein ligation and stripping      left leg  . Colonoscopy N/A 03/28/2015    SLF: 1. Abdominal pain, diarrhea, rectal bleeding due to obstructing colon mass  . Esophagogastroduodenoscopy N/A 03/28/2015    SLF: 1. dysphagia 2. Mild non-erosive gastritis.   . Esophageal dilation N/A 03/28/2015    Procedure: ESOPHAGEAL DILATION;  Surgeon: Peter Binder, Beasley;  Location[facility] ENDO [facility];  Service: Endoscopy;  Laterality: N/A;  . Partial colectomy N/A 04/03/2015    Procedure: PARTIAL COLECTOMY;  Surgeon: Peter Signs Md, Beasley;  Location: [facility] ORS;  Service: General;  Laterality: N/A;  . Portacath placement N/A 05/10/2015    Procedure: INSERTION PORT-A-CATH;  Surgeon: Peter Signs Md, Beasley;  Location: [facility] ORS;  Service: General;  Laterality: N/A;  left subclavian  . Skin cancer destruction Left 08/16/15    left side of nose and left back  . Colonoscopy N/A 10/03/2015    SLF: normal anastomosis, fair prep (polyps less than 1cm could be missed), anal fissures and verrucous anal canal lesion with benign biopsy. Next  TCS in 09/2016.  . Colonoscopy N/A 10/02/2015    SLF: normal anastomosis, poor bowel prep, three anal fissures     reports that he has never smoked. He has never used smokeless tobacco. He reports that he does not drink alcohol or use illicit drugs.  No Known Allergies  Family History  Problem Relation Age of Onset  . Breast cancer Mother     dx under 51  . Colon cancer Mother     dx in her mid 37s  . Heart disease Father   . Hyperlipidemia Father   . Liver cancer Father     heavy ETOH user when young  .  Cancer Sister     slow "blood" cancer  . Diabetes Sister   . Heart disease Sister   . Diabetes Brother   . Cancer Maternal Aunt     2-3 maternal aunts with Cancer NOS  . Lung cancer Paternal Uncle     three uncles with lung cancer - all smokers  . Cancer Paternal Grandmother     possible bone cancer   Unacceptable: Noncontributory, unremarkable, or negative. Acceptable: Family history reviewed and not pertinent (If you reviewed it)  Prior to Admission medications   Medication Sig Start Date End Date Taking? Authorizing Provider  albuterol (PROVENTIL HFA;VENTOLIN HFA) 108 (90 Base) MCG/ACT inhaler Inhale 1-2 puffs into the lungs every 6 (six) hours as needed for wheezing or shortness of breath. Reported on 02/20/2016   Yes Historical Provider, Beasley  bumetanide (BUMEX) 2 MG tablet Take 2 mg by mouth 2 (two) times daily.    Yes Historical Provider, Beasley  carbidopa-levodopa (SINEMET IR) 25-100 MG tablet Take 1 tablet by mouth 3 (three) times daily.  03/13/16  Yes Historical Provider, Beasley  dabigatran (PRADAXA) 150 MG CAPS capsule Take 150 mg by mouth 2 (two) times daily.   Yes Historical Provider, Beasley  dicyclomine (BENTYL) 10 MG capsule Take 1 capsule (10 mg total) by mouth 2 (two) times daily. 1 po 30 MINS BEFORE BREAKFAST AND MAY REPEAT BEFORE LUNCH. 03/22/16  Yes [name], Beasley  escitalopram (LEXAPRO) 20 MG tablet Take 1 tablet (20 mg total) by mouth daily. 01/02/16  Yes Baird Cancer, PA-C  hydrocortisone (ANUSOL-HC) 25 MG suppository Place 1 suppository (25 mg total) rectally 2 (two) times daily. 04/16/16  Yes [name], Beasley  levETIRAcetam (KEPPRA) 500 MG tablet Take 2 tablets (1,000 mg total) by mouth 2 (two) times daily. 04/16/16  Yes [name], Beasley  meclizine (ANTIVERT) 25 MG tablet Take 1 tablet (25 mg total) by mouth 3 (three) times daily as needed for dizziness. 03/22/16  Yes [name], Beasley  metolazone (ZAROXOLYN) 2.5 MG tablet Take 2.5 mg by mouth every Thursday.   Yes  Historical Provider, Beasley  nitroGLYCERIN (NITROGLYN) 2 % ointment Apply 0.5 inches topically as needed for chest pain. Reported on 02/20/2016   Yes Historical Provider, Beasley  omeprazole (PRILOSEC) 20 MG capsule Take 1 capsule (20 mg total) by mouth daily. 03/15/15  Yes [name], PA-C  potassium chloride SA (K-DUR,KLOR-CON) 20 MEQ tablet Take 1 tablet (20 mEq total) by mouth 2 (two) times daily. 03/22/16  Yes [name], Beasley  PROCTOZONE-HC 2.5 % rectal cream Apply 1 application topically daily as needed. 04/19/16  Yes Historical Provider, Beasley  psyllium (METAMUCIL) 58.6 % packet Take 1 packet by mouth daily.   Yes Historical Provider, Beasley  spironolactone (ALDACTONE) 25 MG tablet Take 0.5 tablets (12.5 mg total) by mouth 2 (two)  times daily. 03/22/16  Yes [name], Beasley    Physical Exam: Filed Vitals:   04/23/2016 2130 04/23/2016 2200 04/14/2016 2230  BP: 114/76 108/95 130/81  Pulse: 78 80 77  Resp: 20 19 18   SpO2: 94% 99% 97%      Constitutional: NAD, calm, comfortable Filed Vitals:   04/25/2016 2130 03/30/2016 2200 04/13/2016 2230  BP: 114/76 108/95 130/81  Pulse: 78 80 77  Resp: 20 19 18   SpO2: 94% 99% 97%   Eyes: PERRL, lids and conjunctivae normal ENMT: Mucous membranes are moist. Posterior pharynx clear of any exudate or lesions.Normal dentition.  Neck: normal, supple, no masses, no thyromegaly Respiratory: clear to auscultation bilaterally, no wheezing, no crackles. Normal respiratory effort. No accessory muscle use.  Cardiovascular: Regular rate and rhythm, no murmurs / rubs / gallops. No extremity edema. 2+ pedal pulses. No carotid bruits.  Abdomen: no tenderness, no masses palpated. Bowel sounds positive.  Musculoskeletal: no clubbing / cyanosis. No joint deformity upper and lower extremities. Good ROM, no contractures. Normal muscle tone.  Skin: no rashes, lesions, ulcers. No induration Neurologic: CN 2-12 grossly intact. Sensation intact, DTR normal. Strength 5/5 in all 4.    Psychiatric: Normal judgment and insight.  Normal mood.   Labs on Admission: I have personally reviewed following labs and imaging studies  CBC:  Recent Labs Lab 04/01/2016 2130  WBC 10.0  NEUTROABS 7.5  HGB 15.4  HCT 40.7  MCV 91.7  PLT [id number]*   Basic Metabolic Panel:  Recent Labs Lab 04/19/2016 2130  NA 133*  K 2.5*  CL 85*  CO2 29  GLUCOSE 223*  BUN 31*  CREATININE 1.49*  CALCIUM 9.6   Liver Function Tests:  Recent Labs Lab 04/28/2016 2130  AST 36  ALT 15*  ALKPHOS 107  BILITOT 2.1*  PROT 8.2*  ALBUMIN 3.7    Recent Labs Lab 04/23/2016 2130  LIPASE 26    Coagulation Profile:  Recent Labs Lab 04/12/2016 2130  INR 1.84*       Component Value Date/Time   COLORURINE YELLOW 04/11/2016 1857   APPEARANCEUR CLEAR 04/11/2016 1857   LABSPEC 1.010 04/11/2016 1857   PHURINE 5.5 04/11/2016 1857   GLUCOSEU NEGATIVE 04/11/2016 1857   HGBUR NEGATIVE 04/11/2016 1857   BILIRUBINUR NEGATIVE 04/11/2016 1857   KETONESUR NEGATIVE 04/11/2016 1857   PROTEINUR NEGATIVE 04/11/2016 1857   UROBILINOGEN 1.0 10/19/2015 0947   NITRITE NEGATIVE 04/11/2016 1857   LEUKOCYTESUR TRACE* 04/11/2016 1857    Radiological Exams on Admission: Ct Head Wo Contrast  04/02/2016  CLINICAL DATA:  Altered mental status. EXAM: CT HEAD WITHOUT CONTRAST TECHNIQUE: Contiguous axial images were obtained from the base of the skull through the vertex without intravenous contrast. COMPARISON:  04/14/2016 FINDINGS: Skull and Sinuses:Negative for fracture or destructive process. No acute sinusitis or mastoiditis. Chronic subtotal opacification of right mastoid air cells. Visualized orbits: Negative. Brain: No evidence of acute infarction, hemorrhage, hydrocephalus, or mass lesion/mass effect. Cerebral volume loss in keeping with age. IMPRESSION: Stable, negative head CT. Electronically Signed   By: Monte Fantasia M.D.   On: 04/05/2016 22:37    EKG: Independently reviewed.  Assessment/Plan  1. Rectal  bleeding, Hgb stable. Patient recently evaluated by GI, who recommended he continue Pradaxa and conservative treatment. Will hold Pradaxa during admission.  Request GI consult to see if TCS now is needed instead of planned Oct 2017 2. Hypokalemia, will replete.  Will check Mag also.  Hold diuretics for now.  3. Acute encephalopathy, appears to  be at baseline. Prior thought was seizure.  Will continue Keppra.  4. Atrial fibrillation, rate controlled. Will hold Pradaxa in the setting of rectal bleeding.   5. HX stroke, residual right-sided weakness. Stable   DVT prophylaxis: SCDs Code Status: DNR Family Communication: Wife and cousin bedside.  Disposition Plan: Anticipate discharge home in 1-2 days.  Consults called: None Admission status: Admit to medical bed.    Orvan Falconer, Beasley  FACP.  [facility]  If 7PM-7AM, please contact night-coverage [email or web address] Password [facility]  04/23/2016, 10:49 PM    By signing my name below, I, Rennis Harding, attest that this documentation has been prepared under the direction and in the presence of Orvan Falconer, Beasley. Electronically signed: Rennis Harding, Scribe. 04/25/2016 10:58pm  I, Dr. Orvan Falconer, personally performed the services described in this documentation. All medical record entries made by the scribe were at my discretion and in my presence. Orvan Falconer, Beasley 04/18/2016

## 2016-04-24 NOTE — ED Provider Notes (Signed)
CSN: [id number]     Arrival date & time 04/03/2016  2113 History  By signing my name below, I, Nicole Kindred, attest that this documentation has been prepared under the direction and in the presence of Julianne Rice, MD.   Electronically Signed: Nicole Kindred, ED Scribe. 04/09/2016. 11:37 PM   Chief Complaint  Patient presents with  . Altered Mental Status  . Rectal Bleeding     The history is provided by the patient and a relative. No language interpreter was used.   HPI Comments: SANTHOSH RANFT is a 70 y.o. male with PMHx of colon cancer, A-fib, and stroke with residual right sided weakness who presents to the Emergency Department via EMS complaining of gradual onset, rectal bleeding x1 episode, onset earlier today. Pt reports associated rectal pain during episodes of bleeding. He also complains of light-headedness and dizziness. Relatives report altered mental status. No other associated symptoms noted. No worsening or alleviating factors noted. Pt denies abdominal pain, chest pain, or any other pertinent symptoms. Pt is currently on pradaxa.   Past Medical History  Diagnosis Date  . Varicose veins   . New onset atrial fibrillation ([place]) 01/02/2015  . Morbid obesity ([place]) 01/02/2015  . Hypertension   . Sleep apnea     been tested but has not received the CPAP yet  . GERD (gastroesophageal reflux disease)   . History of gout   . Family history of colon cancer   . Family history of breast cancer in mother   . Parkinson disease ([place])   . Cancer ([place]) 02/2015    colon  . Colon cancer [facility])     March 2016  . History of anal fissures    Past Surgical History  Procedure Laterality Date  . Vein ligation and stripping      left leg  . Colonoscopy N/A 03/28/2015    SLF: 1. Abdominal pain, diarrhea, rectal bleeding due to obstructing colon mass  . Esophagogastroduodenoscopy N/A 03/28/2015    SLF: 1. dysphagia 2. Mild non-erosive gastritis.   . Esophageal dilation N/A 03/28/2015     Procedure: ESOPHAGEAL DILATION;  Surgeon: [name], MD;  Location: [facility] ENDO SU[facility];  Service: Endoscopy;  Laterality: N/A;  . Partial colectomy N/A 04/03/2015    Procedure: PARTIAL COLECTOMY;  Surgeon: [name], MD;  Location: [facility] ORS;  Service: General;  Laterality: N/A;  . Portacath placement N/A 05/10/2015    Procedure: INSERTION PORT-A-CATH;  Surgeon: [name], MD;  Location: [facility] ORS;  Service: General;  Laterality: N/A;  left subclavian  . Skin cancer destruction Left 08/16/15    left side of nose and left back  . Colonoscopy N/A 10/03/2015    SLF: normal anastomosis, fair prep (polyps less than 1cm could be missed), anal fissures and verrucous anal canal lesion with benign biopsy. Next TCS in 09/2016.  . Colonoscopy N/A 10/02/2015    SLF: normal anastomosis, poor bowel prep, three anal fissures   Family History  Problem Relation Age of Onset  . Breast cancer Mother     dx under 109  . Colon cancer Mother     dx in her mid 16s  . Heart disease Father   . Hyperlipidemia Father   . Liver cancer Father     heavy ETOH user when young  . Cancer Sister     slow "blood" cancer  . Diabetes Sister   . Heart disease Sister   . Diabetes Brother   . Cancer Maternal Aunt  2-3 maternal aunts with Cancer NOS  . Lung cancer Paternal Uncle     three uncles with lung cancer - all smokers  . Cancer Paternal Grandmother     possible bone cancer   Social History  Substance Use Topics  . Smoking status: Never Smoker   . Smokeless tobacco: Never Used  . Alcohol Use: No    Review of Systems  Constitutional: Negative for fever and chills.  Eyes: Negative for visual disturbance.  Respiratory: Negative for chest tightness and shortness of breath.   Cardiovascular: Negative for chest pain, palpitations and leg swelling.  Gastrointestinal: Positive for anal bleeding and rectal pain. Negative for nausea, vomiting, abdominal pain, diarrhea and constipation.  Genitourinary:  Negative for dysuria, frequency and flank pain.  Musculoskeletal: Negative for myalgias, back pain, neck pain and neck stiffness.  Skin: Negative for rash and wound.  Neurological: Positive for dizziness and light-headedness. Negative for syncope, weakness and numbness.  Psychiatric/Behavioral: Positive for confusion.  All other systems reviewed and are negative.     Allergies  Review of patient's allergies indicates no known allergies.  Home Medications   Prior to Admission medications   Medication Sig Start Date End Date Taking? Authorizing Provider  albuterol (PROVENTIL HFA;VENTOLIN HFA) 108 (90 Base) MCG/ACT inhaler Inhale 1-2 puffs into the lungs every 6 (six) hours as needed for wheezing or shortness of breath. Reported on 02/20/2016   Yes Historical Provider, MD  bumetanide (BUMEX) 2 MG tablet Take 2 mg by mouth 2 (two) times daily.    Yes Historical Provider, MD  carbidopa-levodopa (SINEMET IR) 25-100 MG tablet Take 1 tablet by mouth 3 (three) times daily.  03/13/16  Yes Historical Provider, MD  dabigatran (PRADAXA) 150 MG CAPS capsule Take 150 mg by mouth 2 (two) times daily.   Yes Historical Provider, MD  dicyclomine (BENTYL) 10 MG capsule Take 1 capsule (10 mg total) by mouth 2 (two) times daily. 1 po 30 MINS BEFORE BREAKFAST AND MAY REPEAT BEFORE LUNCH. 03/22/16  Yes [name], MD  escitalopram (LEXAPRO) 20 MG tablet Take 1 tablet (20 mg total) by mouth daily. 01/02/16  Yes Baird Cancer, PA-C  hydrocortisone (ANUSOL-HC) 25 MG suppository Place 1 suppository (25 mg total) rectally 2 (two) times daily. 04/16/16  Yes [name], MD  levETIRAcetam (KEPPRA) 500 MG tablet Take 2 tablets (1,000 mg total) by mouth 2 (two) times daily. 04/16/16  Yes [name], MD  meclizine (ANTIVERT) 25 MG tablet Take 1 tablet (25 mg total) by mouth 3 (three) times daily as needed for dizziness. 03/22/16  Yes [name], MD  metolazone (ZAROXOLYN) 2.5 MG tablet Take 2.5 mg by mouth every  Thursday.   Yes Historical Provider, MD  nitroGLYCERIN (NITROGLYN) 2 % ointment Apply 0.5 inches topically as needed for chest pain. Reported on 02/20/2016   Yes Historical Provider, MD  omeprazole (PRILOSEC) 20 MG capsule Take 1 capsule (20 mg total) by mouth daily. 03/15/15  Yes [name], PA-C  potassium chloride SA (K-DUR,KLOR-CON) 20 MEQ tablet Take 1 tablet (20 mEq total) by mouth 2 (two) times daily. 03/22/16  Yes [name], MD  PROCTOZONE-HC 2.5 % rectal cream Apply 1 application topically daily as needed. 04/19/16  Yes Historical Provider, MD  psyllium (METAMUCIL) 58.6 % packet Take 1 packet by mouth daily.   Yes Historical Provider, MD  spironolactone (ALDACTONE) 25 MG tablet Take 0.5 tablets (12.5 mg total) by mouth 2 (two) times daily. 03/22/16  Yes [name], MD  BP 114/76 mmHg  Pulse 78  Resp 20  SpO2 94% Physical Exam  Constitutional: He is oriented to person, place, and time. He appears well-developed and well-nourished. No distress.  HENT:  Head: Normocephalic and atraumatic.  Mouth/Throat: Oropharynx is clear and moist.  Eyes: EOM are normal. Pupils are equal, round, and reactive to light.  Neck: Normal range of motion. Neck supple.  Cardiovascular: Normal rate and regular rhythm.   Pulmonary/Chest: Effort normal and breath sounds normal. No respiratory distress. He has no wheezes. He has no rales.  Abdominal: Soft. Bowel sounds are normal. He exhibits no distension and no mass. There is no tenderness. There is no rebound and no guarding.  Genitourinary:  Gross red blood on rectal exam. No rectal tenderness or hemorrhoids present.  Musculoskeletal: Normal range of motion. He exhibits no edema or tenderness.  Neurological: He is alert and oriented to person, place, and time.  4/5 motor in right upper and right lower extremities compared to left 5/5 motor. Sensation intact. No obvious facial asymmetry. Speaking in a clear voice.  Skin: Skin is warm and dry. No  rash noted. No erythema.  Psychiatric: He has a normal mood and affect. His behavior is normal.  Nursing note and vitals reviewed.   ED Course  Procedures (including critical care time) DIAGNOSTIC STUDIES: Oxygen Saturation is 94% on RA, adequate by my interpretation.    COORDINATION OF CARE: 9:45 PM Discussed treatment plan which includes CT head without contrast, CBC with differential/platelet, CMP, lipase, urinalysis, and EKG with pt at bedside and pt agreed to plan.   Labs Review Labs Reviewed  CBC WITH DIFFERENTIAL/PLATELET - Abnormal; Notable for the following:    MCH 34.7 (*)    MCHC 37.8 (*)    Platelets 133 (*)    All other components within normal limits  COMPREHENSIVE METABOLIC PANEL - Abnormal; Notable for the following:    Sodium 133 (*)    Potassium 2.5 (*)    Chloride 85 (*)    Glucose, Bld 223 (*)    BUN 31 (*)    Creatinine, Ser 1.49 (*)    Total Protein 8.2 (*)    ALT 15 (*)    Total Bilirubin 2.1 (*)    GFR calc non Af Amer 46 (*)    GFR calc Af Amer 53 (*)    Anion gap 19 (*)    All other components within normal limits  PROTIME-INR - Abnormal; Notable for the following:    Prothrombin Time 21.2 (*)    INR 1.84 (*)    All other components within normal limits  APTT - Abnormal; Notable for the following:    aPTT 61 (*)    All other components within normal limits  LIPASE, BLOOD  URINALYSIS, ROUTINE W REFLEX MICROSCOPIC (NOT AT [facility])  I-STAT TROPOININ, ED  TYPE AND SCREEN    Imaging Review Ct Head Wo Contrast  04/28/2016  CLINICAL DATA:  Altered mental status. EXAM: CT HEAD WITHOUT CONTRAST TECHNIQUE: Contiguous axial images were obtained from the base of the skull through the vertex without intravenous contrast. COMPARISON:  04/14/2016 FINDINGS: Skull and Sinuses:Negative for fracture or destructive process. No acute sinusitis or mastoiditis. Chronic subtotal opacification of right mastoid air cells. Visualized orbits: Negative. Brain: No evidence  of acute infarction, hemorrhage, hydrocephalus, or mass lesion/mass effect. Cerebral volume loss in keeping with age. IMPRESSION: Stable, negative head CT. Electronically Signed   By: Monte Fantasia M.D.   On: 04/19/2016 22:37  I have personally reviewed and evaluated these images and lab results as part of my medical decision-making.   EKG Interpretation None      MDM   Final diagnoses:  Lower GI bleed  Hypokalemia    I personally performed the services described in this documentation, which was scribed in my presence. The recorded information has been reviewed and is accurate.    No further bleeding episodes in the emergency department. Patient's vital signs remained stable. Hemoglobin is 15. Discussed with [facility] and we'll admit.    Julianne Rice, MD 04/20/2016 2337

## 2016-04-24 NOTE — ED Notes (Signed)
Pt brought in pov for rectal bleeding and also for change in his mental status that started approx 2030.  Pt has previous stroke and per his wife "has not been normal since then"  Pt is awake and alert and answers questions appropriately at this time although did not know the current president

## 2016-04-25 ENCOUNTER — Ambulatory Visit (HOSPITAL_COMMUNITY): Payer: Medicare Other | Admitting: Physical Therapy

## 2016-04-25 DIAGNOSIS — K625 Hemorrhage of anus and rectum: Secondary | ICD-10-CM | POA: Diagnosis not present

## 2016-04-25 DIAGNOSIS — R4182 Altered mental status, unspecified: Secondary | ICD-10-CM

## 2016-04-25 DIAGNOSIS — K922 Gastrointestinal hemorrhage, unspecified: Secondary | ICD-10-CM | POA: Diagnosis not present

## 2016-04-25 DIAGNOSIS — Z7901 Long term (current) use of anticoagulants: Secondary | ICD-10-CM | POA: Diagnosis not present

## 2016-04-25 LAB — CBC
HCT: 41 % (ref 39.0–52.0)
HEMATOCRIT: 36.1 % — AB (ref 39.0–52.0)
HEMATOCRIT: 36.1 % — AB (ref 39.0–52.0)
HEMOGLOBIN: 15.3 g/dL (ref 13.0–17.0)
Hemoglobin: 13.6 g/dL (ref 13.0–17.0)
Hemoglobin: 13.4 g/dL (ref 13.0–17.0)
MCH: 34.3 pg — AB (ref 26.0–34.0)
MCH: 34.1 pg — ABNORMAL HIGH (ref 26.0–34.0)
MCH: 34.3 pg — ABNORMAL HIGH (ref 26.0–34.0)
MCHC: 37.7 g/dL — ABNORMAL HIGH (ref 30.0–36.0)
MCHC: 37.1 g/dL — ABNORMAL HIGH (ref 30.0–36.0)
MCHC: 37.3 g/dL — ABNORMAL HIGH (ref 30.0–36.0)
MCV: 91.2 fL (ref 78.0–100.0)
MCV: 91.9 fL (ref 78.0–100.0)
MCV: 91.9 fL (ref 78.0–100.0)
Platelets: 124 10*3/uL — ABNORMAL LOW (ref 150–400)
Platelets: 113 10*3/uL — ABNORMAL LOW (ref 150–400)
Platelets: 142 10*3/uL — ABNORMAL LOW (ref 150–400)
RBC: 3.96 MIL/uL — AB (ref 4.22–5.81)
RBC: 3.93 MIL/uL — ABNORMAL LOW (ref 4.22–5.81)
RBC: 4.46 MIL/uL (ref 4.22–5.81)
RDW: 13.4 % (ref 11.5–15.5)
RDW: 13.5 % (ref 11.5–15.5)
RDW: 13.8 % (ref 11.5–15.5)
WBC: 11.2 10*3/uL — ABNORMAL HIGH (ref 4.0–10.5)
WBC: 10.3 10*3/uL (ref 4.0–10.5)
WBC: 11.1 10*3/uL — ABNORMAL HIGH (ref 4.0–10.5)

## 2016-04-25 LAB — COMPREHENSIVE METABOLIC PANEL
ALBUMIN: 3.1 g/dL — AB (ref 3.5–5.0)
ALK PHOS: 85 U/L (ref 38–126)
ALT: 19 U/L (ref 17–63)
AST: 28 U/L (ref 15–41)
Anion gap: 11 (ref 5–15)
BUN: 28 mg/dL — ABNORMAL HIGH (ref 6–20)
CALCIUM: 9.2 mg/dL (ref 8.9–10.3)
CHLORIDE: 95 mmol/L — AB (ref 101–111)
CO2: 32 mmol/L (ref 22–32)
CREATININE: 1.16 mg/dL (ref 0.61–1.24)
GFR calc Af Amer: >60 mL/min (ref >60)
GFR calc non Af Amer: >60 mL/min (ref >60)
GLUCOSE: 129 mg/dL — AB (ref 65–99)
Potassium: 3.1 mmol/L — ABNORMAL LOW (ref 3.5–5.1)
SODIUM: 138 mmol/L (ref 135–145)
Total Bilirubin: 2.1 mg/dL — ABNORMAL HIGH (ref 0.3–1.2)
Total Protein: 6.8 g/dL (ref 6.5–8.1)

## 2016-04-25 LAB — AMMONIA: Ammonia: 184 umol/L — ABNORMAL HIGH (ref 9–35)

## 2016-04-25 LAB — MAGNESIUM: Magnesium: 1.7 mg/dL (ref 1.7–2.4)

## 2016-04-25 MED ORDER — POTASSIUM CHLORIDE CRYS ER 20 MEQ PO TBCR
20.0000 meq | EXTENDED_RELEASE_TABLET | Freq: Two times a day (BID) | ORAL | Status: DC
  Administered 2016-04-25: 20 meq via ORAL
  Filled 2016-04-25 (×3): qty 1

## 2016-04-25 MED ORDER — CARBIDOPA-LEVODOPA 25-100 MG PO TABS
1.0000 | ORAL_TABLET | Freq: Three times a day (TID) | ORAL | Status: DC
  Filled 2016-04-25 (×2): qty 1

## 2016-04-25 MED ORDER — ONDANSETRON HCL 4 MG PO TABS: 4.0000 mg | ORAL_TABLET | Freq: Four times a day (QID) | ORAL | Status: DC | PRN

## 2016-04-25 MED ORDER — ALBUTEROL SULFATE (2.5 MG/3ML) 0.083% IN NEBU: 3.0000 mL | INHALATION_SOLUTION | Freq: Four times a day (QID) | RESPIRATORY_TRACT | Status: DC | PRN

## 2016-04-25 MED ORDER — POTASSIUM CHLORIDE 10 MEQ/100ML IV SOLN
10.0000 meq | Freq: Once | INTRAVENOUS | Status: AC
Start: 2016-04-25 — End: 2016-04-25
  Administered 2016-04-25: 10 meq via INTRAVENOUS
  Filled 2016-04-25: qty 100

## 2016-04-25 MED ORDER — LORAZEPAM 2 MG/ML IJ SOLN
1.0000 mg | INTRAMUSCULAR | Status: DC | PRN
  Administered 2016-04-25: 1 mg via INTRAVENOUS
  Filled 2016-04-25: qty 1

## 2016-04-25 MED ORDER — PANTOPRAZOLE SODIUM 40 MG PO TBEC: 40.0000 mg | DELAYED_RELEASE_TABLET | Freq: Every day | ORAL | Status: DC

## 2016-04-25 MED ORDER — PANTOPRAZOLE SODIUM 40 MG IV SOLR
40.0000 mg | Freq: Two times a day (BID) | INTRAVENOUS | Status: DC
  Administered 2016-04-25: 40 mg via INTRAVENOUS
  Filled 2016-04-25 (×2): qty 40

## 2016-04-25 MED ORDER — ONDANSETRON HCL 4 MG/2ML IJ SOLN: 4.0000 mg | Freq: Four times a day (QID) | INTRAMUSCULAR | Status: DC | PRN

## 2016-04-25 MED ORDER — PANTOPRAZOLE SODIUM 40 MG PO TBEC: 40.0000 mg | DELAYED_RELEASE_TABLET | Freq: Two times a day (BID) | ORAL | Status: DC

## 2016-04-25 MED ORDER — BOOST / RESOURCE BREEZE PO LIQD: 1.0000 | Freq: Three times a day (TID) | ORAL | Status: DC

## 2016-04-25 MED ORDER — LEVETIRACETAM 500 MG PO TABS
750.0000 mg | ORAL_TABLET | Freq: Two times a day (BID) | ORAL | Status: DC
  Administered 2016-04-25: 750 mg via ORAL
  Filled 2016-04-25 (×3): qty 1

## 2016-04-25 MED ORDER — ESCITALOPRAM OXALATE 10 MG PO TABS
20.0000 mg | ORAL_TABLET | Freq: Every day | ORAL | Status: DC
  Filled 2016-04-25: qty 2

## 2016-04-25 MED ORDER — LACTULOSE ENEMA
300.0000 mL | Freq: Two times a day (BID) | ORAL | Status: DC
  Administered 2016-04-25: 300 mL via RECTAL
  Filled 2016-04-25 (×3): qty 300

## 2016-04-25 NOTE — Consult Note (Signed)
Referring Provider: [name], MD Primary Care Physician:  [name], MD Primary Gastroenterologist:  Barney Drain, MD  Reason for Consultation:  BRBPR  HPI: [name] is a 70 y.o. male with recent small lacunar infarct (discharged less than 2 weeks ago), partial seizures, Afib, colon cancer diagnosed 2016, recent rectal bleeding during last admission. Bleeding felt to be related to anal fissure and self-limiting therefore recommended to hold off procedures and continue pradaxa in setting of acute cva. Presents last night with rectal bleeding, "like a period" per wife.   Single episode of brbpr this time. Bleeding associated with rectal pain per ED report but wife denies today. Patient unable to provide any history. Lethargic. Opens eyes on command but does not provide meaningful verbal communication. He has h/o anorectal fissures. Last colonoscopy 09/2015. There has been some question of possible cirrhosis based on elastography with F3/F4 scores. No known splenomegaly. No esophageal varices on EGD one year ago. With presentation, patient's family members reported altered mental status. Had been doing well since last discharge.  In ER, gross red blood on rectal exam. No tenderness or hemorrhoids present on exam. This morning he has had blood clots per rectum, incontinence. Patient denies abd pain. No report of heartburn, vomiting. Had been eating well until yesterday. Last dose of Pradaxa at 7pm last night.    Prior to Admission medications   Medication Sig Start Date End Date Taking? Authorizing Provider  albuterol (PROVENTIL HFA;VENTOLIN HFA) 108 (90 Base) MCG/ACT inhaler Inhale 1-2 puffs into the lungs every 6 (six) hours as needed for wheezing or shortness of breath. Reported on 02/20/2016   Yes Historical Provider, MD  bumetanide (BUMEX) 2 MG tablet Take 2 mg by mouth 2 (two) times daily.    Yes Historical Provider, MD  carbidopa-levodopa (SINEMET IR) 25-100 MG tablet Take  1 tablet by mouth 3 (three) times daily.  03/13/16  Yes Historical Provider, MD  dabigatran (PRADAXA) 150 MG CAPS capsule Take 150 mg by mouth 2 (two) times daily.   Yes Historical Provider, MD  dicyclomine (BENTYL) 10 MG capsule Take 1 capsule (10 mg total) by mouth 2 (two) times daily. 1 po 30 MINS BEFORE BREAKFAST AND MAY REPEAT BEFORE LUNCH. 03/22/16  Yes [name], MD  escitalopram (LEXAPRO) 20 MG tablet Take 1 tablet (20 mg total) by mouth daily. 01/02/16  Yes Baird Cancer, PA-C  hydrocortisone (ANUSOL-HC) 25 MG suppository Place 1 suppository (25 mg total) rectally 2 (two) times daily. 04/16/16  Yes [name], MD  levETIRAcetam (KEPPRA) 500 MG tablet Take 2 tablets (1,000 mg total) by mouth 2 (two) times daily. 04/16/16  Yes [name], MD  meclizine (ANTIVERT) 25 MG tablet Take 1 tablet (25 mg total) by mouth 3 (three) times daily as needed for dizziness. 03/22/16  Yes [name], MD  metolazone (ZAROXOLYN) 2.5 MG tablet Take 2.5 mg by mouth every Thursday.   Yes Historical Provider, MD  nitroGLYCERIN (NITROGLYN) 2 % ointment Apply 0.5 inches topically as needed for chest pain. Reported on 02/20/2016   Yes Historical Provider, MD  omeprazole (PRILOSEC) 20 MG capsule Take 1 capsule (20 mg total) by mouth daily. 03/15/15  Yes [name], PA-C  potassium chloride SA (K-DUR,KLOR-CON) 20 MEQ tablet Take 1 tablet (20 mEq total) by mouth 2 (two) times daily. 03/22/16  Yes [name], MD  PROCTOZONE-HC 2.5 % rectal cream Apply 1 application topically daily as needed. 04/19/16  Yes Historical Provider, MD  psyllium (METAMUCIL) 58.6 % packet  Take 1 packet by mouth daily.   Yes Historical Provider, MD  spironolactone (ALDACTONE) 25 MG tablet Take 0.5 tablets (12.5 mg total) by mouth 2 (two) times daily. 03/22/16  Yes [name], MD    Current Facility-Administered Medications  Medication Dose Route Frequency Provider Last Rate Last Dose  . albuterol (PROVENTIL) (2.5 MG/3ML)  0.083% nebulizer solution 3 mL  3 mL Inhalation Q6H PRN Orvan Falconer, MD      . carbidopa-levodopa (SINEMET IR) 25-100 MG per tablet immediate release 1 tablet  1 tablet Oral TID Orvan Falconer, MD      . escitalopram (LEXAPRO) tablet 20 mg  20 mg Oral Daily Orvan Falconer, MD      . levETIRAcetam (KEPPRA) tablet 750 mg  750 mg Oral BID Orvan Falconer, MD   750 mg at 04/25/16 0254  . LORazepam (ATIVAN) injection 1 mg  1 mg Intravenous Q4H PRN Orvan Falconer, MD   1 mg at 04/25/16 0345  . ondansetron (ZOFRAN) tablet 4 mg  4 mg Oral Q6H PRN Orvan Falconer, MD       Or  . ondansetron [facility]) injection 4 mg  4 mg Intravenous Q6H PRN Orvan Falconer, MD      . pantoprazole (PROTONIX) EC tablet 40 mg  40 mg Oral Daily Orvan Falconer, MD      . potassium chloride SA (K-DUR,KLOR-CON) CR tablet 20 mEq  20 mEq Oral BID Orvan Falconer, MD   20 mEq at 04/25/16 0254    Allergies as of 04/17/2016  . (No Known Allergies)    Past Medical History  Diagnosis Date  . Varicose veins   . New onset atrial fibrillation ([place]) 01/02/2015  . Morbid obesity ([place]) 01/02/2015  . Hypertension   . Sleep apnea     been tested but has not received the CPAP yet  . GERD (gastroesophageal reflux disease)   . History of gout   . Family history of colon cancer   . Family history of breast cancer in mother   . Parkinson disease ([place])   . Cancer ([place]) 02/2015    colon  . Colon cancer [facility])     March 2016  . History of anal fissures     Past Surgical History  Procedure Laterality Date  . Vein ligation and stripping      left leg  . Colonoscopy N/A 03/28/2015    SLF: 1. Abdominal pain, diarrhea, rectal bleeding due to obstructing colon mass  . Esophagogastroduodenoscopy N/A 03/28/2015    SLF: 1. dysphagia 2. Mild non-erosive gastritis.   . Esophageal dilation N/A 03/28/2015    Procedure: ESOPHAGEAL DILATION;  Surgeon: [name], MD;  Location: [facility] ENDO SUITE;  Service: Endoscopy;  Laterality: N/A;  . Partial colectomy N/A 04/03/2015    Procedure: PARTIAL COLECTOMY;   Surgeon: [name], MD;  Location: [facility] ORS;  Service: General;  Laterality: N/A;  . Portacath placement N/A 05/10/2015    Procedure: INSERTION PORT-A-CATH;  Surgeon: [name], MD;  Location: [facility] ORS;  Service: General;  Laterality: N/A;  left subclavian  . Skin cancer destruction Left 08/16/15    left side of nose and left back  . Colonoscopy N/A 10/03/2015    SLF: normal anastomosis, fair prep (polyps less than 1cm could be missed), anal fissures and verrucous anal canal lesion with benign biopsy. Next TCS in 09/2016.  . Colonoscopy N/A 10/02/2015    SLF: normal anastomosis, poor bowel prep, three anal fissures    Family History  Problem Relation Age of Onset  . Breast cancer Mother     dx under 74  . Colon cancer Mother     dx in her mid 65s  . Heart disease Father   . Hyperlipidemia Father   . Liver cancer Father     heavy ETOH user when young  . Cancer Sister     slow "blood" cancer  . Diabetes Sister   . Heart disease Sister   . Diabetes Brother   . Cancer Maternal Aunt     2-3 maternal aunts with Cancer NOS  . Lung cancer Paternal Uncle     three uncles with lung cancer - all smokers  . Cancer Paternal Grandmother     possible bone cancer    Social History   Social History  . Marital Status: Married    Spouse Name: [name]  . Number of Children: 2  . Years of Education: N/A   Occupational History  . Not on file.   Social History Main Topics  . Smoking status: Never Smoker   . Smokeless tobacco: Never Used  . Alcohol Use: No  . Drug Use: No  . Sexual Activity: No   Other Topics Concern  . Not on file   Social History Narrative     ROS: patient unable to provide        Physical Examination: Vital signs in last 24 hours: Temp:  [97.8 F (36.6 C)] 97.8 F (36.6 C) (04/27 0500) Pulse Rate:  [77-93] 79 (04/27 0500) Resp:  [18-20] 18 (04/27 0500) BP: (108-130)/(75-95) 122/79 mmHg (04/27 0500) SpO2:  [94 %-99 %] 99 % (04/27 0500) Weight:  [238  lb 8.6 oz (108.2 kg)] 238 lb 8.6 oz (108.2 kg) (04/27 0102) Last BM Date: 04/14/2016  General: chronically ill-appearing. Lethargic. Opens eyes. No verbal communication. Head: Normocephalic, atraumatic.   Eyes: Conjunctiva pink, no icterus. Mouth: could not evalaute Neck: Supple without thyromegaly, masses, or lymphadenopathy.  Lungs: Clear to auscultation bilaterally.  Heart: Regular rate and rhythm, no murmurs rubs or gallops.  Abdomen: Bowel sounds are normal, nontender, nondistended, no hepatosplenomegaly or masses, no abdominal bruits or    hernia , no rebound or guarding.   Rectal: gross blood and clots noted in bed, incontinence.  Extremities: No lower extremity edema, clubbing, deformity.  Neuro: lethargic. Skin: Warm and dry, no rash or jaundice.   Psych: lethargic        Intake/Output from previous day: 04/26 0701 - 04/27 0700 In: 1500 [I.V.:1500] Out: 250 [Urine:250] Intake/Output this shift:    Lab Results: CBC  Recent Labs  04/19/2016 2130 04/25/16 0111  WBC 10.0 11.2*  HGB 15.4 13.6  HCT 40.7 36.1*  MCV 91.7 91.2  PLT 133* 124*   BMET  Recent Labs  04/27/2016 2130  NA 133*  K 2.5*  CL 85*  CO2 29  GLUCOSE 223*  BUN 31*  CREATININE 1.49*  CALCIUM 9.6   LFT  Recent Labs  04/21/2016 2130  BILITOT 2.1*  ALKPHOS 107  AST 36  ALT 15*  PROT 8.2*  ALBUMIN 3.7    Lipase  Recent Labs  04/03/2016 2130  LIPASE 26    PT/INR  Recent Labs  04/21/2016 2130  LABPROT 21.2*  INR 1.84*      Imaging Studies: Dg Chest 2 View  04/11/2016  CLINICAL DATA:  Altered mental status/confusion EXAM: CHEST  2 VIEW COMPARISON:  March 15, 2016 FINDINGS: Port-A-Cath tip is at the junction of the left innominate vein  and superior vena cava. No pneumothorax. There is no edema or consolidation. Heart size and pulmonary vascularity are normal. No adenopathy. There is degenerative change in the lower thoracic and visualized lumbar regions. There is slight anterior  wedging of the L1 vertebral body, stable. IMPRESSION: No edema or consolidation. Stable cardiac silhouette. Port-A-Cath tip at the junction of the left innominate vein and superior vena cava. No pneumothorax. Electronically Signed   By: Lowella Grip III M.D.   On: 04/11/2016 17:16   Ct Head Wo Contrast  04/28/2016  CLINICAL DATA:  Altered mental status. EXAM: CT HEAD WITHOUT CONTRAST TECHNIQUE: Contiguous axial images were obtained from the base of the skull through the vertex without intravenous contrast. COMPARISON:  04/14/2016 FINDINGS: Skull and Sinuses:Negative for fracture or destructive process. No acute sinusitis or mastoiditis. Chronic subtotal opacification of right mastoid air cells. Visualized orbits: Negative. Brain: No evidence of acute infarction, hemorrhage, hydrocephalus, or mass lesion/mass effect. Cerebral volume loss in keeping with age. IMPRESSION: Stable, negative head CT. Electronically Signed   By: Monte Fantasia M.D.   On: 04/16/2016 22:37   Ct Head Wo Contrast  04/14/2016  CLINICAL DATA:  70 year old male with new onset of altered mental status. Code stroke. EXAM: CT HEAD WITHOUT CONTRAST TECHNIQUE: Contiguous axial images were obtained from the base of the skull through the vertex without intravenous contrast. COMPARISON:  Brain MRI 04/11/2016.  Head CT 04/11/2016. FINDINGS: Mild cerebral atrophy. No acute intracranial abnormalities. Specifically, no evidence of acute intracranial hemorrhage, no definite findings of acute/subacute cerebral ischemia, no mass, mass effect, hydrocephalus or abnormal intra or extra-axial fluid collections. Visualized paranasal sinuses and mastoids are generally well pneumatized. Small mucosal retention cyst or polyp in the posterior aspect of the left maxillary sinus incompletely visualized. No acute displaced skull fractures are identified. IMPRESSION: 1. No acute intracranial abnormalities. 2. Mild cerebral atrophy. These results will be called  to the ordering clinician or representative by the Radiologist Assistant, and communication documented in the PACS or zVision Dashboard. Electronically Signed   By: Vinnie Langton M.D.   On: 04/14/2016 09:35   Ct Head Wo Contrast  04/11/2016  CLINICAL DATA:  Weakness, altered mental status, and confusion today, history colon cancer, skin cancer, hypertension, multiple falls in past month EXAM: CT HEAD WITHOUT CONTRAST TECHNIQUE: Contiguous axial images were obtained from the base of the skull through the vertex without intravenous contrast. COMPARISON:  10/19/2015 FINDINGS: Generalized atrophy. Normal ventricular morphology. No midline shift or mass effect. Otherwise normal appearance of brain parenchyma. No intracranial hemorrhage, mass lesion or evidence acute infarction. No extra-axial fluid collections. Mucosal retention cyst LEFT maxillary sinus. Minimal fluid in a few RIGHT mastoid air cells. No acute bone or sinus abnormalities otherwise identified. IMPRESSION: Generalized atrophy. No acute intracranial abnormalities. Electronically Signed   By: Lavonia Dana M.D.   On: 04/11/2016 17:48   Mr Brain Wo Contrast  04/11/2016  CLINICAL DATA:  70 year old male with weakness, confusion, multiple recent falls. Initial encounter. EXAM: MRI HEAD WITHOUT CONTRAST TECHNIQUE: Multiplanar, multiecho pulse sequences of the brain and surrounding structures were obtained without intravenous contrast. COMPARISON:  Head CT 1739 hours today.  Brain MRI 10/20/2015. FINDINGS: Major intracranial vascular flow voids are stable, with chronic abnormal appearance of the bilateral distal vertebral arteries, although as before there appears to be reconstituted flow in the basilar artery. There is a subtle punctate area of restricted diffusion in the right paracentral lower midbrain seen on both series 100, image 15 and series 101,  image 28. No associated T2 or FLAIR hyperintensity. No associated hemorrhage or mass effect. No  restricted diffusion elsewhere. No midline shift, mass effect, evidence of mass lesion, ventriculomegaly, extra-axial collection or acute intracranial hemorrhage. Cervicomedullary junction and pituitary are within normal limits. Negative visualized cervical spine. Stable gray and white matter signal, largely unremarkable for age throughout the brain. No cortical encephalomalacia or chronic cerebral blood products. Visible internal auditory structures appear normal. Stable mild mastoid effusions. New mucosal thickening or mucous retention cyst in the left maxillary sinus. Stable mild ethmoid mucosal thickening. Orbit and scalp soft tissues are negative. Visualized bone marrow signal is within normal limits. IMPRESSION: 1. Suggestion of a tiny acute lacunar infarct in the right midbrain. 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 2. No associated hemorrhage or mass effect. No other acute intracranial abnormality. Electronically Signed   By: Genevie Ann M.D.   On: 04/11/2016 19:11   US Carotid Bilateral  04/12/2016  CLINICAL DATA:  CVA. EXAM: BILATERAL CAROTID DUPLEX ULTRASOUND TECHNIQUE: Pearline Cables scale imaging, color Doppler and duplex ultrasound were performed of bilateral carotid and vertebral arteries in the neck. COMPARISON:  MRI 04/11/2016. FINDINGS: Criteria: Quantification of carotid stenosis is based on velocity parameters that correlate the residual internal carotid diameter with NASCET-based stenosis levels, using the diameter of the distal internal carotid lumen as the denominator for stenosis measurement. The following velocity measurements were obtained: RIGHT ICA:  80/32 cm/sec CCA:  [id number] cm/sec SYSTOLIC ICA/CCA RATIO:  1.2 DIASTOLIC ICA/CCA RATIO:  1.8 ECA:  72 cm/sec LEFT ICA:  86/21 cm/sec CCA:  [id number] 80 cm/sec SYSTOLIC ICA/CCA RATIO:  0.9 DIASTOLIC ICA/CCA RATIO:  0.8 ECA:  75 cm/sec RIGHT CAROTID ARTERY: Mild right carotid bifurcation plaque. No flow limiting stenosis. RIGHT  VERTEBRAL ARTERY:  Patent with antegrade flow. LEFT CAROTID ARTERY: Mild plaque left carotid bifurcation. No flow limiting stenosis. LEFT VERTEBRAL ARTERY:  Patent with retrograde flow. IMPRESSION: 1. 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 No flow limiting stenosis. Degree of stenosis less than 50%. 2. Right vertebral is patent antegrade flow. Left vertebral is patent left with retrograde flow. Subclavian steal cannot be excluded. Electronically Signed   By: Marcello Moores  Register   On: 04/12/2016 12:37   Mr Jodene Nam Head/brain Wo Cm  04/11/2016  CLINICAL DATA:  Followup infarct. Air history of hypertension, Parkinson's disease and colon cancer. EXAM: MRA HEAD WITHOUT CONTRAST TECHNIQUE: Angiographic images of the Circle of Willis were obtained using MRA technique without intravenous contrast. COMPARISON:  MRI of the brain May 11, 2016 at 1832 hours FINDINGS: Anterior circulation: Normal flow related enhancement of the included cervical, petrous, cavernous and supraclinoid internal carotid arteries. Patent anterior communicating artery. Normal flow related enhancement of the anterior and middle cerebral arteries, limited evaluation of mid to distal segments on mid images due to technique though, normal on raw data. No large vessel occlusion, high-grade stenosis, abnormal luminal irregularity, aneurysm. Posterior circulation: Codominant vertebral arteries. Basilar artery is patent, with normal flow related enhancement of the main branch vessels. Normal flow related enhancement of the posterior cerebral arteries. Fetal origin bilateral posterior cerebral arteries. Limited evaluation of mid to distal segments on mid images due to technique though, normal on raw data. No large vessel occlusion, high-grade stenosis, abnormal luminal irregularity, aneurysm. IMPRESSION: Negative MRA head. Electronically Signed   By: Elon Alas M.D.   On: 04/11/2016 22:23  [4 week]   Impression: 70 y/o male  on Pradaxa (last dose 4/26 at 7pm) who presents with mental  status changes and rectal bleeding. Recent CVA. ?cirrhosis with F3/F4 scores on elastography. No esophageal varices on EGD one year ago. H/o colon cancer diagnosed 02/2015 (partial colectomy with splenic flexure takedown 03/2015) with last TCS 09/2015 fair prep, anastomosis look normal, anal fissures and verrucous anal canal lesion with benign biopsy.   Currently with moderate volume hematochezia in the setting of Pradaxa suspected lower GI bleed. Hemodynamically stable. Doubt UGI bleed.   Plan: 1. Pradaxa is currently on hold. 2. Agree with PPI increase to bid for now. 3. Consider colonoscopy +/- EGD, likely tomorrow given last dose of Pradaxa 12 hours ago. 4. Monitor H/H closely. 5. Check ammonia level.  We would like to thank you for the opportunity to participate in the care of [name].  Laureen Ochs. Bernarda Caffey [facility] [phone] 4/27/20178:37 AM

## 2016-04-25 NOTE — Progress Notes (Signed)
0307 Patient continues to try to get OOB w/o assistance and is extremely weak. Bed alarms are being utilized yet patient continues to slide himself to the end of his bed w/o calling for assistance. Patient noted anxious and restless. MD notified requesting medication for anxiety.

## 2016-04-25 NOTE — Care Management Obs Status (Signed)
[place] NOTIFICATION   Patient Details  Name: [name] MRN: [id number] Date of Birth: 1946-10-13   Medicare Observation Status Notification Given:  Yes    Alvie Heidelberg, RN 04/25/2016, 12:57 PM

## 2016-04-25 NOTE — Progress Notes (Signed)
Subjective: Patient was admitted due to rectal bleeding. Patient was evaluated. No significant drop in H/H. He is sleepy and confused  Objective: Vital signs in last 24 hours: Temp:  [97.8 F (36.6 C)-99.2 F (37.3 C)] 99.2 F (37.3 C) (04/27 2138) Pulse Rate:  [77-115] 115 (04/27 2138) Resp:  [18-24] 24 (04/27 2138) BP: (82-125)/(49-80) 82/49 mmHg (04/27 2138) SpO2:  [96 %-99 %] 96 % (04/27 2138) Weight:  [108.2 kg (238 lb 8.6 oz)] 108.2 kg (238 lb 8.6 oz) (04/27 0102) Weight change:  Last BM Date: 04/25/16  Intake/Output from previous day: 04/26 0701 - 04/27 0700 In: 1500 [I.V.:1500] Out: 250 [Urine:250]  PHYSICAL EXAM General appearance: no distress Resp: clear to auscultation bilaterally and good air entry Cardio: S1, S2 normal GI: soft, non-tender; bowel sounds normal; no masses,  no organomegaly Extremities: extremities normal, atraumatic, no cyanosis or edema  Lab Results:  Results for orders placed or performed during the hospital encounter of 04/04/2016 (from the past 48 hour(s))  CBC with Differential/Platelet     Status: Abnormal   Collection Time: 04/22/2016  9:30 PM  Result Value Ref Range   WBC 10.0 4.0 - 10.5 K/uL   RBC 4.44 4.22 - 5.81 MIL/uL   Hemoglobin 15.4 13.0 - 17.0 g/dL   HCT 40.7 39.0 - 52.0 %   MCV 91.7 78.0 - 100.0 fL   MCH 34.7 (H) 26.0 - 34.0 pg   MCHC 37.8 (H) 30.0 - 36.0 g/dL   RDW 13.4 11.5 - 15.5 %   Platelets 133 (L) 150 - 400 K/uL   Neutrophils Relative % 75 %   Neutro Abs 7.5 1.7 - 7.7 K/uL   Lymphocytes Relative 14 %   Lymphs Abs 1.4 0.7 - 4.0 K/uL   Monocytes Relative 10 %   Monocytes Absolute 1.0 0.1 - 1.0 K/uL   Eosinophils Relative 1 %   Eosinophils Absolute 0.1 0.0 - 0.7 K/uL   Basophils Relative 0 %   Basophils Absolute 0.0 0.0 - 0.1 K/uL  Comprehensive metabolic panel     Status: Abnormal   Collection Time: 04/15/2016  9:30 PM  Result Value Ref Range   Sodium 133 (L) 135 - 145 mmol/L   Potassium 2.5 (LL) 3.5 - 5.1 mmol/L     Comment: CRITICAL RESULT CALLED TO, READ BACK BY AND VERIFIED WITH: NORMAN,B ON 04/21/2016 AT 2220 BY LOY,C    Chloride 85 (L) 101 - 111 mmol/L   CO2 29 22 - 32 mmol/L   Glucose, Bld 223 (H) 65 - 99 mg/dL   BUN 31 (H) 6 - 20 mg/dL   Creatinine, Ser 1.49 (H) 0.61 - 1.24 mg/dL   Calcium 9.6 8.9 - 10.3 mg/dL   Total Protein 8.2 (H) 6.5 - 8.1 g/dL   Albumin 3.7 3.5 - 5.0 g/dL   AST 36 15 - 41 U/L   ALT 15 (L) 17 - 63 U/L   Alkaline Phosphatase 107 38 - 126 U/L   Total Bilirubin 2.1 (H) 0.3 - 1.2 mg/dL   GFR calc non Af Amer 46 (L) >60 mL/min   GFR calc Af Amer 53 (L) >60 mL/min    Comment: (NOTE) The eGFR has been calculated using the CKD EPI equation. This calculation has not been validated in all clinical situations. eGFR's persistently <60 mL/min signify possible Chronic Kidney Disease.    Anion gap 19 (H) 5 - 15  Lipase, blood     Status: None   Collection Time: 04/25/2016  9:30 PM  Result Value Ref Range   Lipase 26 11 - 51 U/L  Protime-INR     Status: Abnormal   Collection Time: 04/14/2016  9:30 PM  Result Value Ref Range   Prothrombin Time 21.2 (H) 11.6 - 15.2 seconds   INR 1.84 (H) 0.00 - 1.49  APTT     Status: Abnormal   Collection Time: 04/23/2016  9:30 PM  Result Value Ref Range   aPTT 61 (H) 24 - 37 seconds    Comment:        IF BASELINE aPTT IS ELEVATED, SUGGEST PATIENT RISK ASSESSMENT BE USED TO DETERMINE APPROPRIATE ANTICOAGULANT THERAPY.   I-stat troponin, ED     Status: None   Collection Time: 04/02/2016  9:55 PM  Result Value Ref Range   Troponin i, poc 0.02 0.00 - 0.08 ng/mL   Comment 3            Comment: Due to the release kinetics of cTnI, a negative result within the first hours of the onset of symptoms does not rule out myocardial infarction with certainty. If myocardial infarction is still suspected, repeat the test at appropriate intervals.   Type and screen     Status: None   Collection Time: 04/28/2016 10:25 PM  Result Value Ref Range    ABO/RH(D) B NEG    Antibody Screen NEG    Sample Expiration 04/27/2016   Urinalysis, Routine w reflex microscopic (not at [facility])     Status: None   Collection Time: 04/04/2016 10:43 PM  Result Value Ref Range   Color, Urine YELLOW YELLOW   APPearance CLEAR CLEAR   Specific Gravity, Urine 1.010 1.005 - 1.030   pH 6.0 5.0 - 8.0   Glucose, UA NEGATIVE NEGATIVE mg/dL   Hgb urine dipstick NEGATIVE NEGATIVE   Bilirubin Urine NEGATIVE NEGATIVE   Ketones, ur NEGATIVE NEGATIVE mg/dL   Protein, ur NEGATIVE NEGATIVE mg/dL   Nitrite NEGATIVE NEGATIVE   Leukocytes, UA NEGATIVE NEGATIVE    Comment: MICROSCOPIC NOT DONE ON URINES WITH NEGATIVE PROTEIN, BLOOD, LEUKOCYTES, NITRITE, OR GLUCOSE <1000 mg/dL.  CBC     Status: Abnormal   Collection Time: 04/25/16  1:11 AM  Result Value Ref Range   WBC 11.2 (H) 4.0 - 10.5 K/uL   RBC 3.96 (L) 4.22 - 5.81 MIL/uL   Hemoglobin 13.6 13.0 - 17.0 g/dL   HCT 36.1 (L) 39.0 - 52.0 %   MCV 91.2 78.0 - 100.0 fL   MCH 34.3 (H) 26.0 - 34.0 pg   MCHC 37.7 (H) 30.0 - 36.0 g/dL   RDW 13.4 11.5 - 15.5 %   Platelets 124 (L) 150 - 400 K/uL  Magnesium     Status: None   Collection Time: 04/25/16  1:11 AM  Result Value Ref Range   Magnesium 1.7 1.7 - 2.4 mg/dL  CBC     Status: Abnormal   Collection Time: 04/25/16  8:41 AM  Result Value Ref Range   WBC 10.3 4.0 - 10.5 K/uL   RBC 3.93 (L) 4.22 - 5.81 MIL/uL   Hemoglobin 13.4 13.0 - 17.0 g/dL   HCT 36.1 (L) 39.0 - 52.0 %   MCV 91.9 78.0 - 100.0 fL   MCH 34.1 (H) 26.0 - 34.0 pg   MCHC 37.1 (H) 30.0 - 36.0 g/dL   RDW 13.5 11.5 - 15.5 %   Platelets 113 (L) 150 - 400 K/uL  Comprehensive metabolic panel     Status: Abnormal   Collection Time: 04/25/16  8:41  AM  Result Value Ref Range   Sodium 138 135 - 145 mmol/L   Potassium 3.1 (L) 3.5 - 5.1 mmol/L    Comment: DELTA CHECK NOTED   Chloride 95 (L) 101 - 111 mmol/L   CO2 32 22 - 32 mmol/L   Glucose, Bld 129 (H) 65 - 99 mg/dL   BUN 28 (H) 6 - 20 mg/dL   Creatinine, Ser  1.16 0.61 - 1.24 mg/dL   Calcium 9.2 8.9 - 10.3 mg/dL   Total Protein 6.8 6.5 - 8.1 g/dL   Albumin 3.1 (L) 3.5 - 5.0 g/dL   AST 28 15 - 41 U/L   ALT 19 17 - 63 U/L   Alkaline Phosphatase 85 38 - 126 U/L   Total Bilirubin 2.1 (H) 0.3 - 1.2 mg/dL   GFR calc non Af Amer >60 >60 mL/min   GFR calc Af Amer >60 >60 mL/min    Comment: (NOTE) The eGFR has been calculated using the CKD EPI equation. This calculation has not been validated in all clinical situations. eGFR's persistently <60 mL/min signify possible Chronic Kidney Disease.    Anion gap 11 5 - 15  Ammonia     Status: Abnormal   Collection Time: 04/25/16  8:41 AM  Result Value Ref Range   Ammonia 184 (H) 9 - 35 umol/L  CBC     Status: Abnormal   Collection Time: 04/25/16  4:50 PM  Result Value Ref Range   WBC 11.1 (H) 4.0 - 10.5 K/uL   RBC 4.46 4.22 - 5.81 MIL/uL   Hemoglobin 15.3 13.0 - 17.0 g/dL   HCT 41.0 39.0 - 52.0 %   MCV 91.9 78.0 - 100.0 fL   MCH 34.3 (H) 26.0 - 34.0 pg   MCHC 37.3 (H) 30.0 - 36.0 g/dL    Comment: SPHEROCYTES   RDW 13.8 11.5 - 15.5 %   Platelets 142 (L) 150 - 400 K/uL    ABGS No results for input(s): PHART, PO2ART, TCO2, HCO3 in the last 72 hours.  Invalid input(s): PCO2 CULTURES No results found for this or any previous visit (from the past 240 hour(s)). Studies/Results: Ct Head Wo Contrast  04/25/2016  CLINICAL DATA:  Altered mental status. EXAM: CT HEAD WITHOUT CONTRAST TECHNIQUE: Contiguous axial images were obtained from the base of the skull through the vertex without intravenous contrast. COMPARISON:  04/14/2016 FINDINGS: Skull and Sinuses:Negative for fracture or destructive process. No acute sinusitis or mastoiditis. Chronic subtotal opacification of right mastoid air cells. Visualized orbits: Negative. Brain: No evidence of acute infarction, hemorrhage, hydrocephalus, or mass lesion/mass effect. Cerebral volume loss in keeping with age. IMPRESSION: Stable, negative head CT.  Electronically Signed   By: Monte Fantasia M.D.   On: 04/20/2016 22:37    Medications: UXN:[id number], LORazepam, ondansetron **OR** ondansetron (ZOFRAN) IV  Assesment Principal Problem:   BRBPR (bright red blood per rectum) Active Problems:   Generalized weakness   Atrial fibrillation (HCC)   Diastolic CHF, acute on chronic (HCC)   Chronic anticoagulation   Hypokalemia   Parkinson's disease ([place])    Plan: Medications reviewed will continue current treatment Will monitor CBC GI consult appreciated    [name] 04/25/2016, 10:57 PM

## 2016-04-26 DIAGNOSIS — K922 Gastrointestinal hemorrhage, unspecified: Secondary | ICD-10-CM | POA: Diagnosis not present

## 2016-04-26 LAB — CBC
HCT: 49.1 % (ref 39.0–52.0)
Hemoglobin: 18.1 g/dL — ABNORMAL HIGH (ref 13.0–17.0)
MCH: 35.4 pg — AB (ref 26.0–34.0)
MCHC: 36.9 g/dL — ABNORMAL HIGH (ref 30.0–36.0)
MCV: 95.9 fL (ref 78.0–100.0)
PLATELETS: 214 10*3/uL (ref 150–400)
RBC: 5.12 MIL/uL (ref 4.22–5.81)
RDW: 14.7 % (ref 11.5–15.5)
WBC: 43.8 10*3/uL — AB (ref 4.0–10.5)

## 2016-04-29 ENCOUNTER — Encounter (HOSPITAL_COMMUNITY): Payer: Medicare Other

## 2016-04-29 NOTE — Progress Notes (Signed)
Pt rang up as asystole at 0229. Assessed pt, zero respirations and no pulse detected. Assessment verified by second RN, time of death 0230. Family member notified at 6, Dr. Maudie Mercury notified at [phone]. [facility] notified, spoke to Bland Span at [phone] referral number [id number].

## 2016-04-29 DEATH — deceased

## 2016-04-30 ENCOUNTER — Encounter (HOSPITAL_COMMUNITY): Payer: Medicare Other | Admitting: Physical Therapy

## 2016-05-02 ENCOUNTER — Encounter (HOSPITAL_COMMUNITY): Payer: Medicare Other | Admitting: Physical Therapy

## 2016-05-04 NOTE — Discharge Summary (Signed)
NAME:  AVONTAY, KREINBRINK NO.:  [id number]  MEDICAL RECORD NO.:  [id number]  LOCATION:  [id number]                          FACILITY:  [facility]  PHYSICIAN:  Yobana Culliton L. Luan Pulling, M.D.DATE OF BIRTH:  08/15/1946  DATE OF ADMISSION:  04/11/2016 DATE OF DISCHARGE:  04/18/2017LH                              DISCHARGE SUMMARY   ADDENDUM:  DISCHARGE DIAGNOSES:  Acute cerebrovascular accident.     Niamya Vittitow L. Luan Pulling, M.D.     ELH/MEDQ  D:  05/04/2016  T:  05/04/2016  Job:  [id number]

## 2016-05-13 ENCOUNTER — Ambulatory Visit: Payer: Medicare Other | Admitting: Nutrition

## 2016-05-17 ENCOUNTER — Ambulatory Visit: Payer: Medicare Other | Admitting: Nutrition

## 2016-05-21 ENCOUNTER — Ambulatory Visit: Payer: Medicare Other | Admitting: Gastroenterology

## 2016-06-11 ENCOUNTER — Ambulatory Visit (HOSPITAL_COMMUNITY): Payer: Medicare Other | Admitting: Hematology & Oncology

## 2016-06-11 ENCOUNTER — Other Ambulatory Visit (HOSPITAL_COMMUNITY): Payer: Medicare Other

## 2016-06-11 ENCOUNTER — Ambulatory Visit: Payer: Medicare Other | Admitting: Cardiology

## 2016-06-19 ENCOUNTER — Other Ambulatory Visit: Payer: Self-pay | Admitting: Nurse Practitioner

## 2016-06-29 NOTE — Discharge Summary (Signed)
Physician Discharge Summary  Patient ID: KILLION HOMRICH MRN: [id number] DOB/AGE: [birth year] 70 y.o. Primary Care Physician:Courtney Fenlon L, MD Admit date: 04/04/2016 Discharge date: 06/17/2016    Discharge Diagnoses:   Principal Problem:   BRBPR (bright red blood per rectum) Active Problems:   Generalized weakness   Atrial fibrillation (HCC)   Diastolic CHF, acute on chronic (HCC)   Chronic anticoagulation   Hypokalemia   Parkinson's disease ([place]) Atypical seizures History of colon cancer    Medication List    ASK your doctor about these medications        albuterol 108 (90 Base) MCG/ACT inhaler  Commonly known as:  PROVENTIL HFA;VENTOLIN HFA  Inhale 1-2 puffs into the lungs every 6 (six) hours as needed for wheezing or shortness of breath. Reported on 02/20/2016     bumetanide 2 MG tablet  Commonly known as:  BUMEX  Take 2 mg by mouth 2 (two) times daily.     carbidopa-levodopa 25-100 MG tablet  Commonly known as:  SINEMET IR  Take 1 tablet by mouth 3 (three) times daily.     dabigatran 150 MG Caps capsule  Commonly known as:  PRADAXA  Take 150 mg by mouth 2 (two) times daily.     dicyclomine 10 MG capsule  Commonly known as:  BENTYL  Take 1 capsule (10 mg total) by mouth 2 (two) times daily. 1 po 30 MINS BEFORE BREAKFAST AND MAY REPEAT BEFORE LUNCH.     escitalopram 20 MG tablet  Commonly known as:  LEXAPRO  Take 1 tablet (20 mg total) by mouth daily.     hydrocortisone 25 MG suppository  Commonly known as:  ANUSOL-HC  Place 1 suppository (25 mg total) rectally 2 (two) times daily.     levETIRAcetam 500 MG tablet  Commonly known as:  KEPPRA  Take 2 tablets (1,000 mg total) by mouth 2 (two) times daily.     meclizine 25 MG tablet  Commonly known as:  ANTIVERT  Take 1 tablet (25 mg total) by mouth 3 (three) times daily as needed for dizziness.     metolazone 2.5 MG tablet  Commonly known as:  ZAROXOLYN  Take 2.5 mg by mouth every Thursday.     nitroGLYCERIN 2 % ointment  Commonly known as:  NITROGLYN  Apply 0.5 inches topically as needed for chest pain. Reported on 02/20/2016     omeprazole 20 MG capsule  Commonly known as:  PRILOSEC  Take 1 capsule (20 mg total) by mouth daily.     potassium chloride SA 20 MEQ tablet  Commonly known as:  K-DUR,KLOR-CON  Take 1 tablet (20 mEq total) by mouth 2 (two) times daily.     PROCTOZONE-HC 2.5 % rectal cream  Generic drug:  hydrocortisone  Apply 1 application topically daily as needed.     psyllium 58.6 % packet  Commonly known as:  METAMUCIL  Take 1 packet by mouth daily.     spironolactone 25 MG tablet  Commonly known as:  ALDACTONE  Take 0.5 tablets (12.5 mg total) by mouth 2 (two) times daily.        Discharged Condition:Not applicable    Consults: Gastroenterology  Significant Diagnostic Studies: No results found.  Lab Results: Basic Metabolic Panel: No results for input(s): NA, K, CL, CO2, GLUCOSE, BUN, CREATININE, CALCIUM, MG, PHOS in the last 72 hours. Liver Function Tests: No results for input(s): AST, ALT, ALKPHOS, BILITOT, PROT, ALBUMIN in the last 72 hours.   CBC: No results for  input(s): WBC, NEUTROABS, HGB, HCT, MCV, PLT in the last 72 hours.  No results found for this or any previous visit (from the past 240 hour(s)).   Hospital Course: This was a 70 year old came to the hospital with bright red blood per rectum. He was found to be hypokalemic. He has multiple other medical problems as listed above. He did not show evidence of acute blood loss anemia and was hemodynamically stable on admission. GI consultation was obtained. He did well the next day but then early on the morning of 04-28-16 was found to be with asystole no pulse and no respirations and was pronounced dead. He had no complaints prior to that. He did have DO NOT RESUSCITATE status.  Discharge Exam: Blood pressure 82/49, pulse 115, temperature 99.2 F (37.3 C), temperature source  Axillary, resp. rate 24, height 5\' 7"  (1.702 m), weight 108.2 kg (238 lb 8.6 oz), SpO2 96 %. Not applicable  Disposition: Body released to funeral home      Signed: Tedra Coppernoll L   06/17/2016, 7:43 AM

## 2016-08-06 ENCOUNTER — Ambulatory Visit: Payer: Medicare Other | Admitting: Gastroenterology

## 2016-08-15 ENCOUNTER — Encounter (HOSPITAL_COMMUNITY): Payer: Self-pay

## 2017-01-07 IMAGING — US US CAROTID DUPLEX BILAT
1 series · 13 of 24 positions shown · non-contrast
Comparison: MRI 04/11/2016.

CLINICAL DATA: CVA.

EXAM:
BILATERAL CAROTID DUPLEX ULTRASOUND
TECHNIQUE: Gray scale imaging, color Doppler and duplex ultrasound were
performed of bilateral carotid and vertebral arteries in the neck.

[Series 1: us carotid duplex bilat · 0.06mm/px · 13 of 68 slices shown]
[im 1/68]
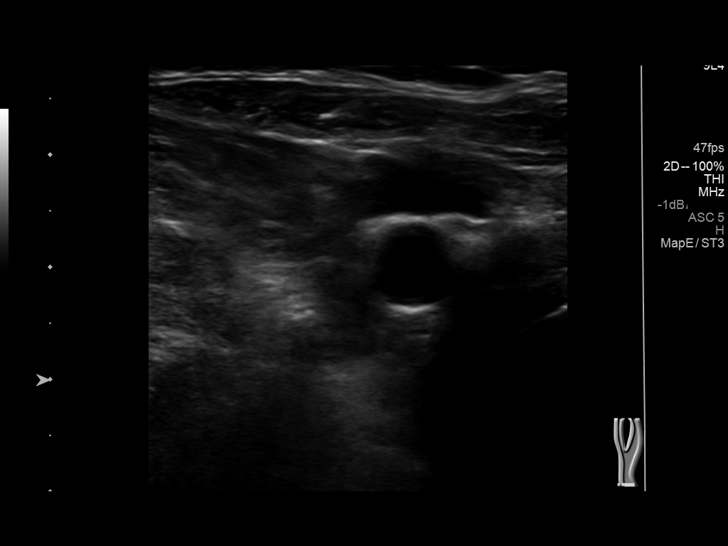
[im 6/68]
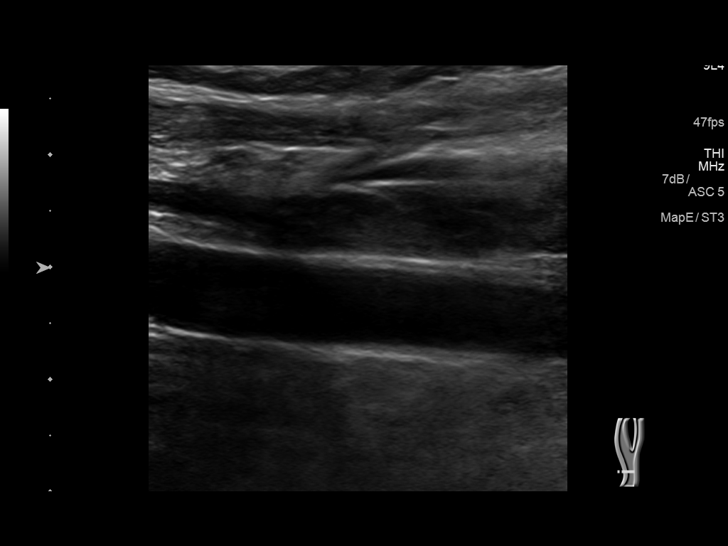
[im 12/68]
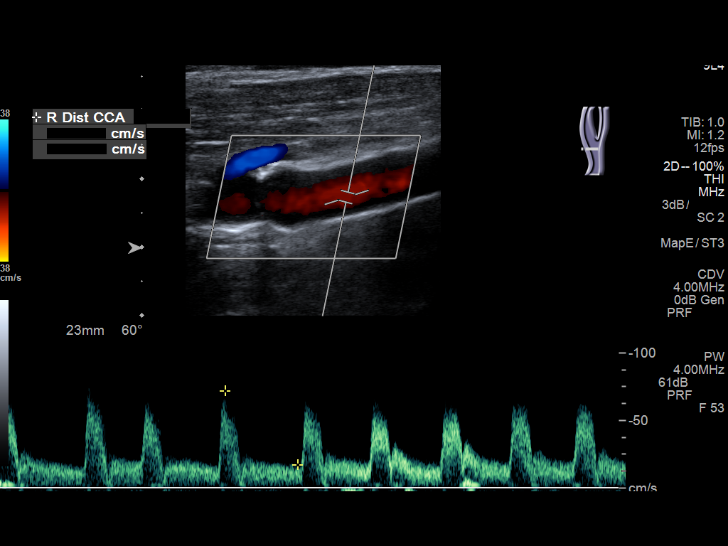
[im 18/68]
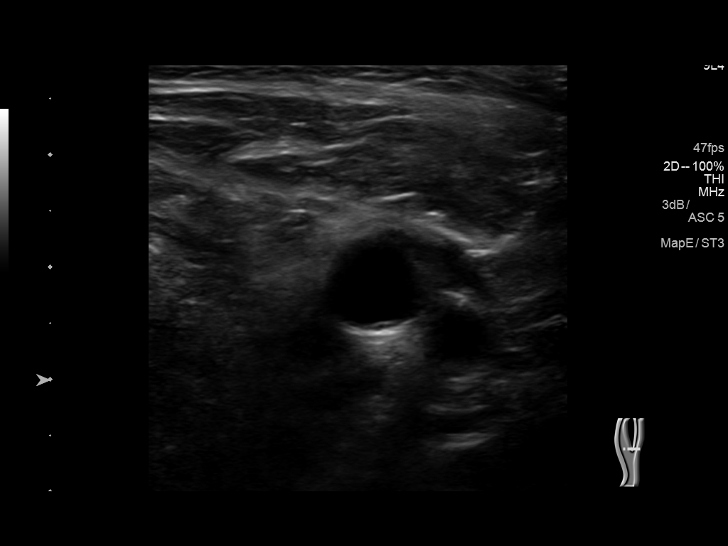
[im 24/68]
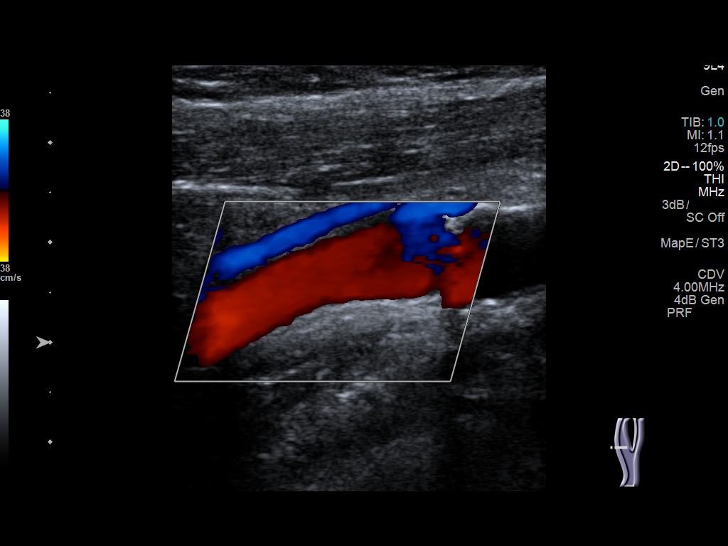
[im 30/68]
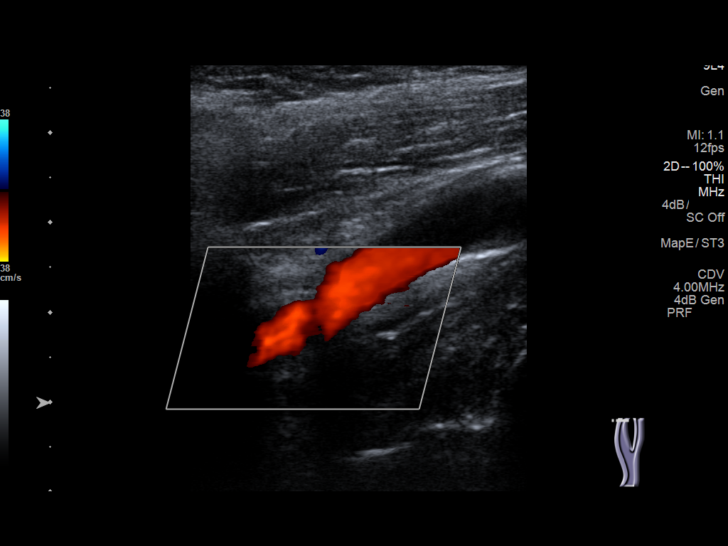
[im 35/68]
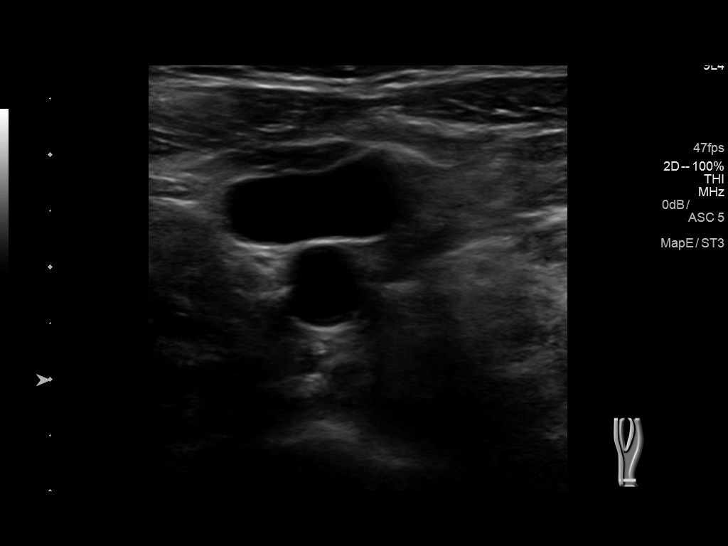
[im 38/68]
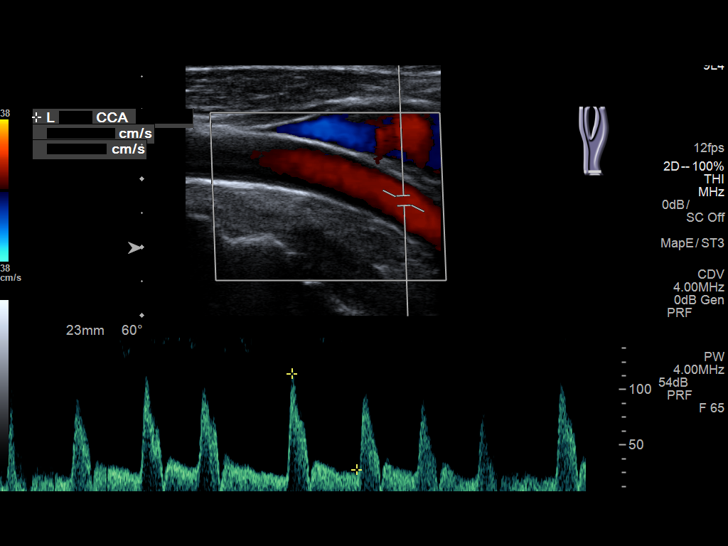
[im 44/68]
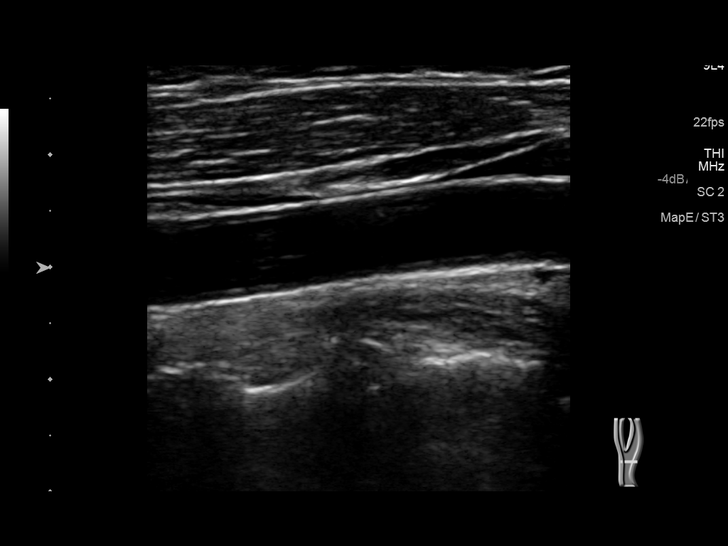
[im 50/68]
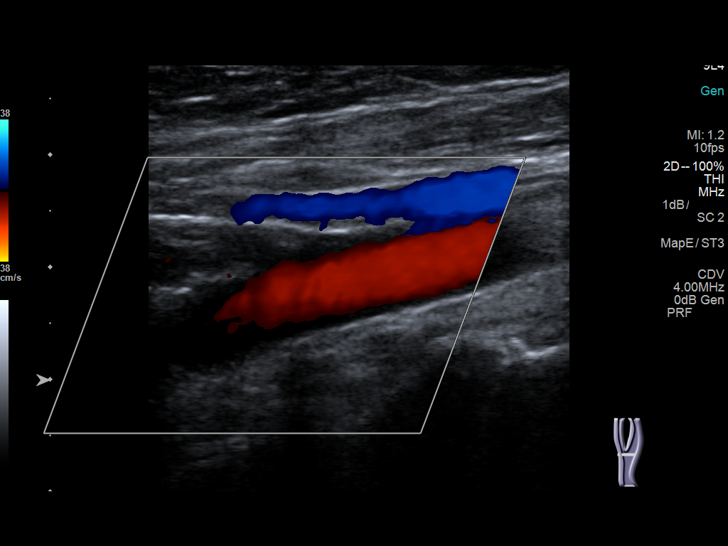
[im 56/68]
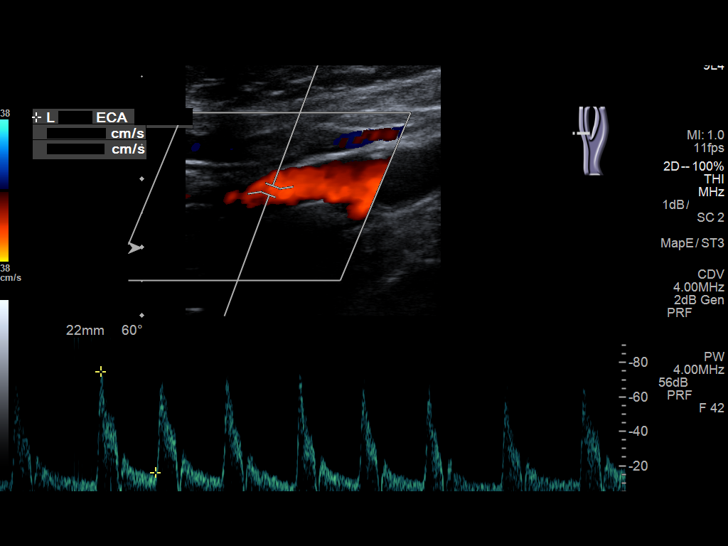
[im 62/68]
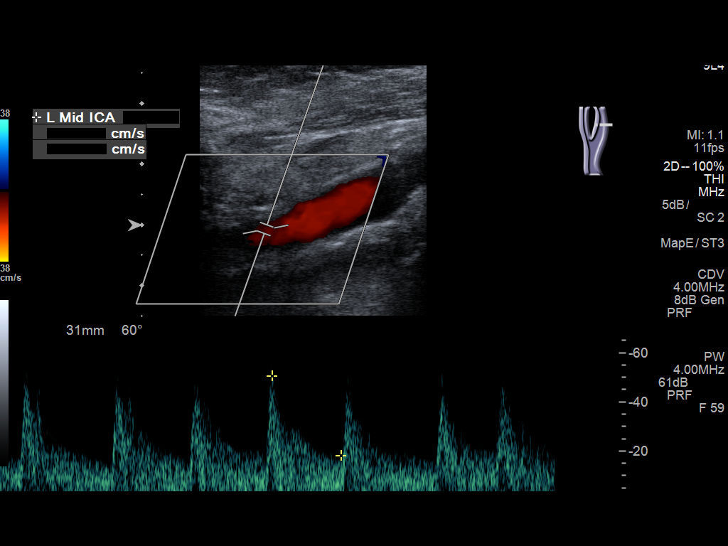
[im 68/68]
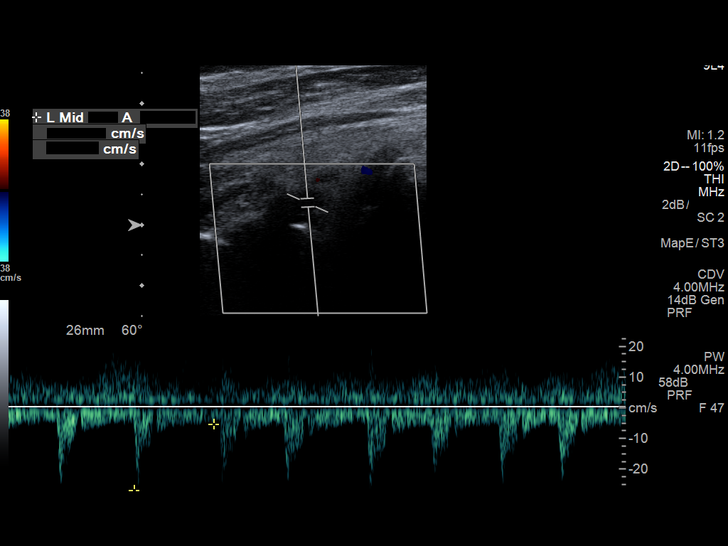

[13 of 24 positions shown; findings below may reference images not displayed]

FINDINGS: Criteria: Quantification of carotid stenosis is based on velocity
parameters that correlate the residual internal carotid diameter
with NASCET-based stenosis levels, using the diameter of the distal
internal carotid lumen as the denominator for stenosis measurement.

The following velocity measurements were obtained:

RIGHT

ICA:  80/32 cm/sec

CCA:  79/22 cm/sec

SYSTOLIC ICA/CCA RATIO:

DIASTOLIC ICA/CCA RATIO:

ECA:  72 cm/sec

LEFT

ICA:  86/21 cm/sec

CCA:  114/20 80 cm/sec

SYSTOLIC ICA/CCA RATIO:

DIASTOLIC ICA/CCA RATIO:

ECA:  75 cm/sec

RIGHT CAROTID ARTERY: Mild right carotid bifurcation plaque. No flow
limiting stenosis.

RIGHT VERTEBRAL ARTERY:  Patent with antegrade flow.

LEFT CAROTID ARTERY: Mild plaque left carotid bifurcation. No flow
limiting stenosis.

LEFT VERTEBRAL ARTERY:  Patent with retrograde flow.
IMPRESSION: 1. Mild bilateral carotid bifurcation atherosclerotic vascular
disease. No flow limiting stenosis. Degree of stenosis less than
50%.

2. Right vertebral is patent antegrade flow. Left vertebral is
patent left with retrograde flow. Subclavian steal cannot be
excluded.

## 2017-07-26 IMAGING — MR MR HEAD W/O CM
6 of 10 series · 21 of 48 positions shown · non-contrast
Comparison: Head CT 5199 hours today.  Brain MRI 10/20/2015.

CLINICAL DATA: 69-year-old male with weakness, confusion, multiple
recent falls. Initial encounter.

EXAM:
MRI HEAD WITHOUT CONTRAST
TECHNIQUE: Multiplanar, multiecho pulse sequences of the brain and surrounding
structures were obtained without intravenous contrast.

[Series 6: T1 · sagittal · 5.0mm · 0.43mm/px · 3 of 21 slices shown (1 of 2)]
[im 1/21]
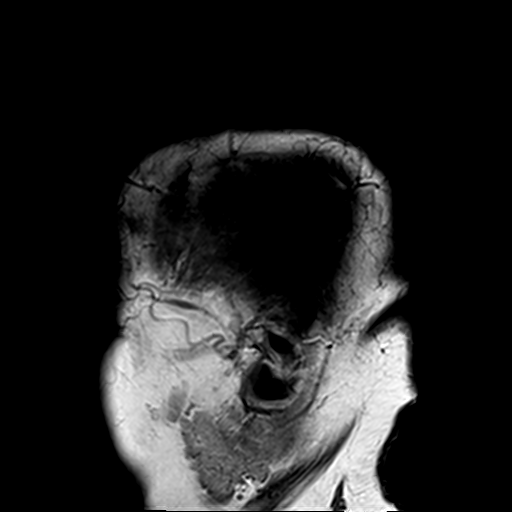
[im 11/21]
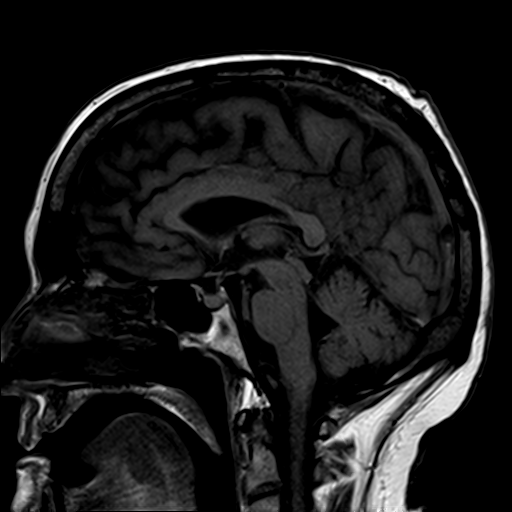
[im 21/21]
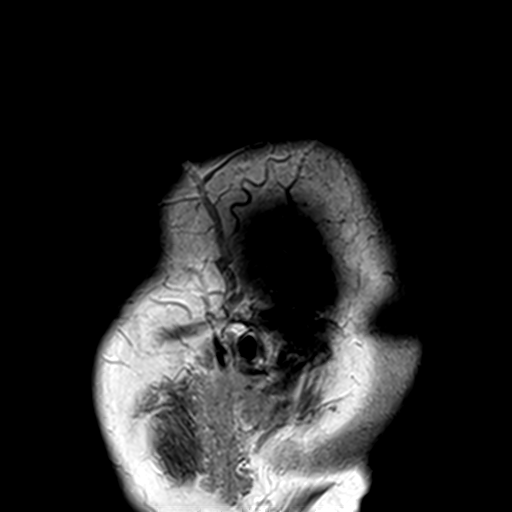

[Series 7: T2 · axial · 5.0mm · 0.50mm/px · z∈[-62,+78]mm · 3 of 23 slices shown (1 of 2)]
[im 1/23]
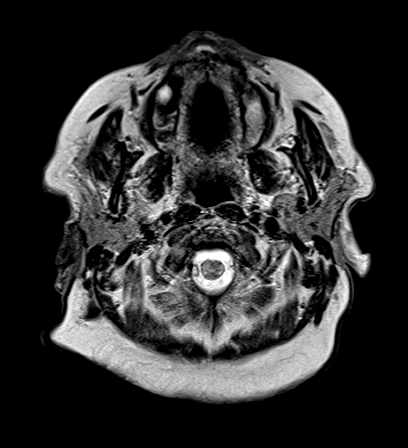
[im 12/23]
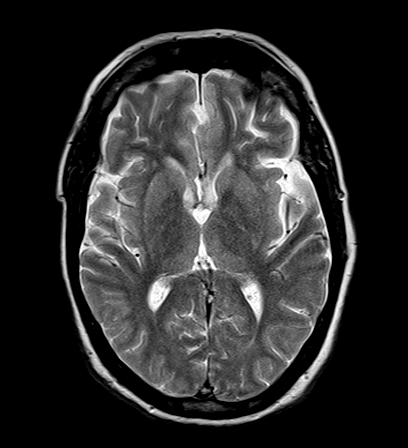
[im 23/23]
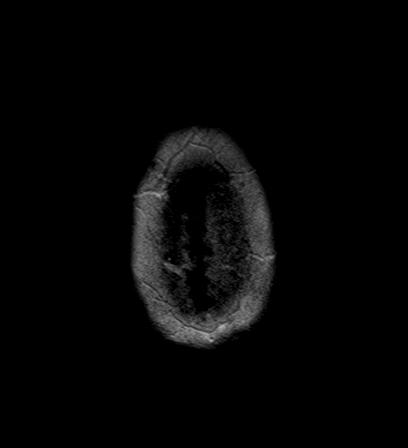

[Series 8: FLAIR · axial · 5.0mm · 0.35mm/px · z∈[-64,+77]mm · 3 of 23 slices shown]
[im 1/23]
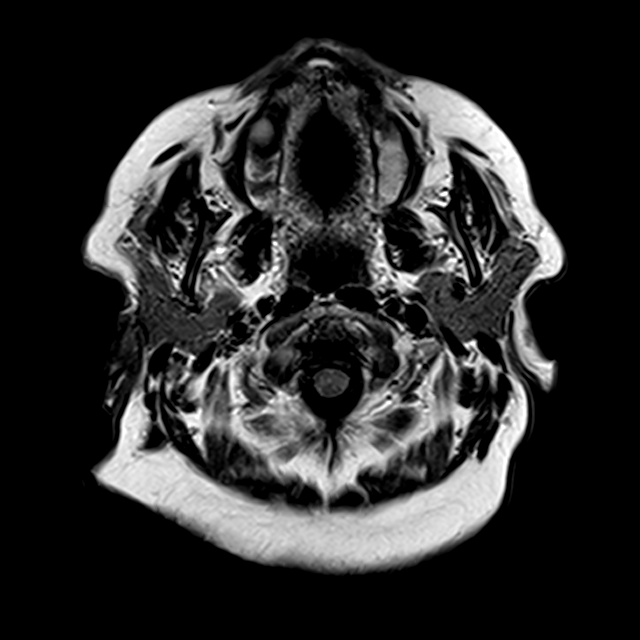
[im 12/23]
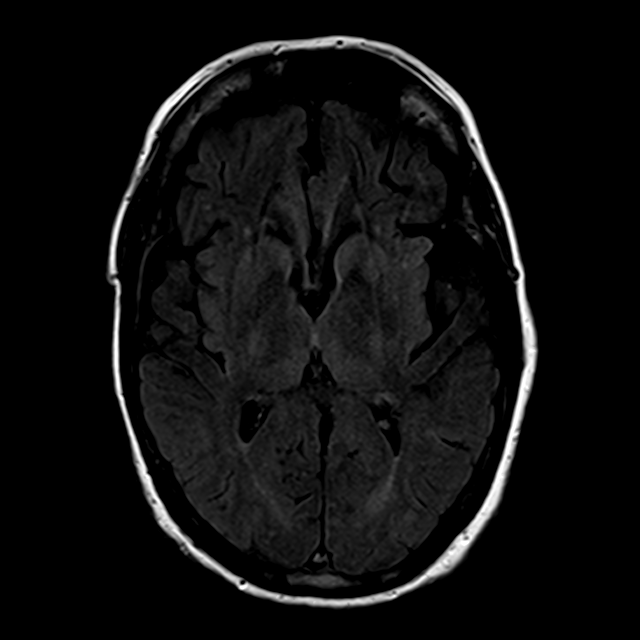
[im 23/23]
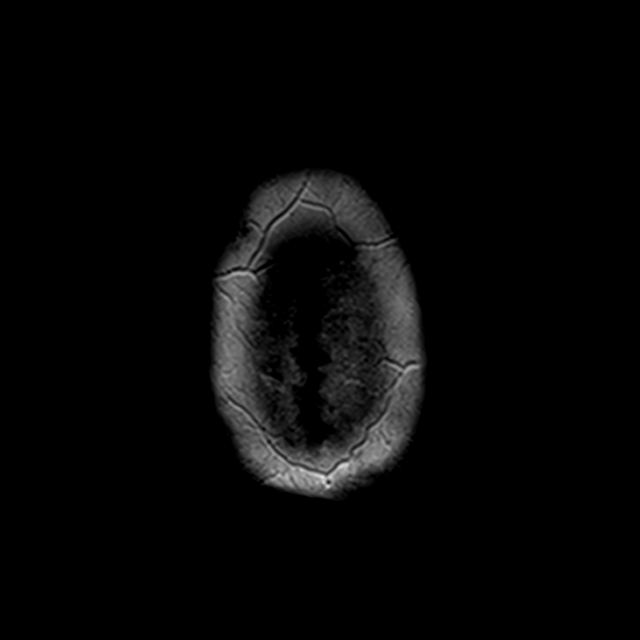

[Series 9: T1 · axial · 2.0mm · 0.43mm/px · z∈[-64,+80]mm · 8 of 74 slices shown (2 of 2)]
[im 1/74]
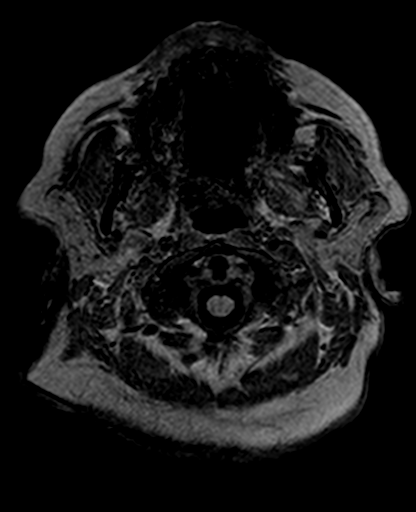
[im 10/74]
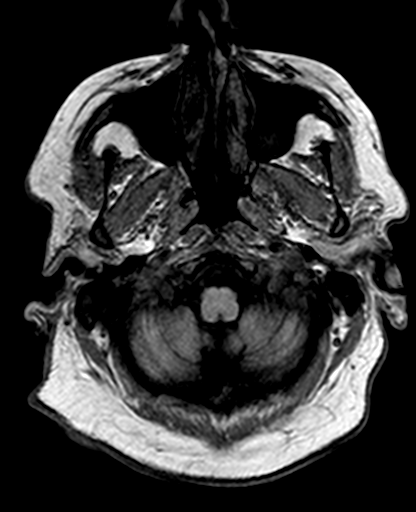
[im 19/74]
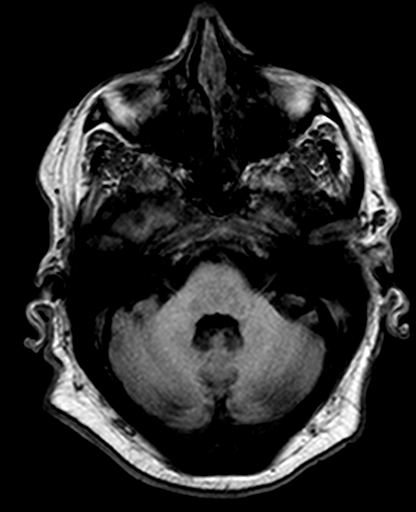
[im 28/74]
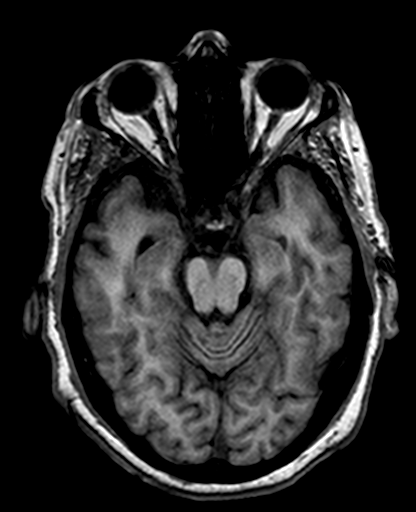
[im 46/74]
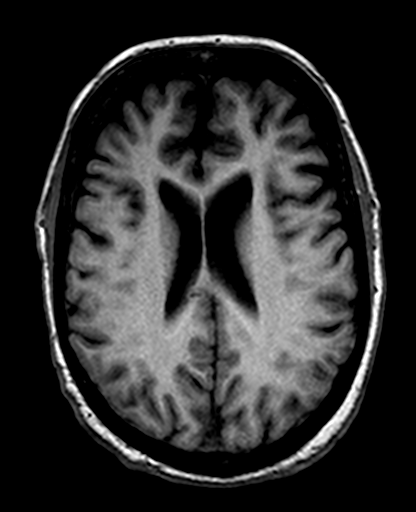
[im 55/74]
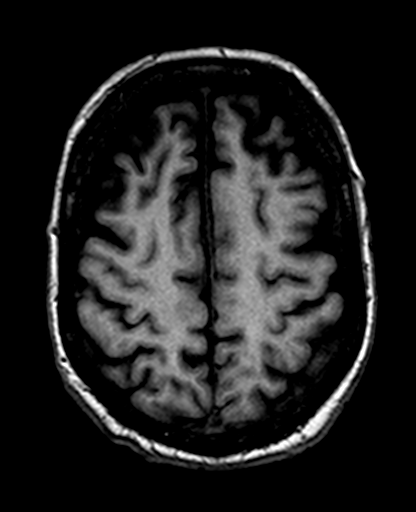
[im 64/74]
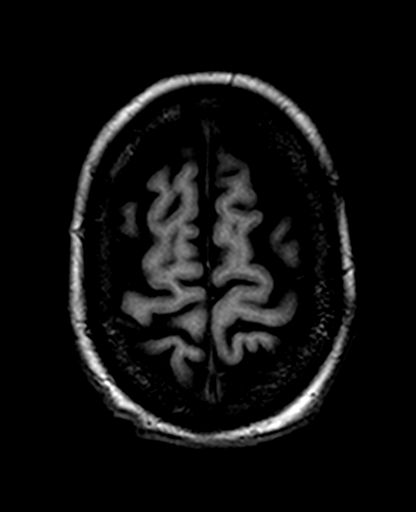
[im 74/74]
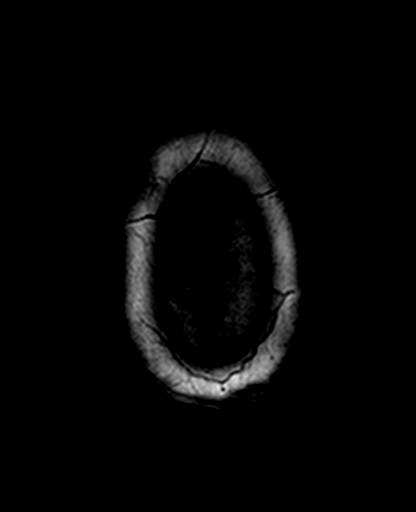

[Series 10: trauma axial · axial · 5.0mm · 0.43mm/px · 1 of 23 slices shown]
[im 1/23]
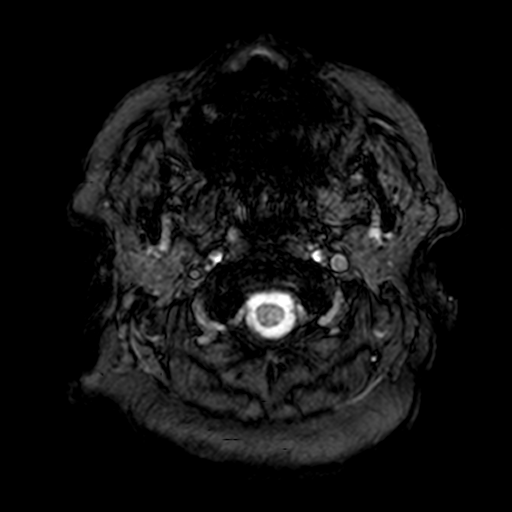

[Series 11: T2 · coronal · 5.0mm · 0.42mm/px · 3 of 28 slices shown (2 of 2)]
[im 1/28]
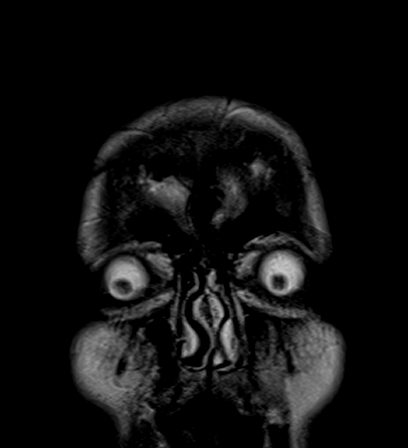
[im 14/28]
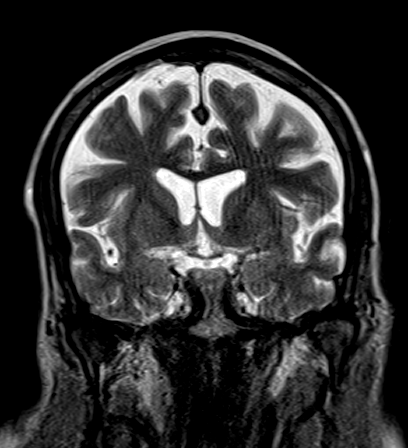
[im 28/28]
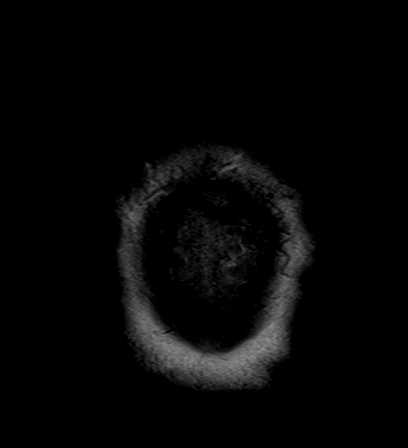

[21 of 48 positions shown; findings below may reference images not displayed]

FINDINGS: Major intracranial vascular flow voids are stable, with chronic
abnormal appearance of the bilateral distal vertebral arteries,
although as before there appears to be reconstituted flow in the
basilar artery. There is a subtle punctate area of restricted
diffusion in the right paracentral lower midbrain seen on both
series 100, image 15 and series 101, image 28. No associated T2 or
FLAIR hyperintensity. No associated hemorrhage or mass effect.

No restricted diffusion elsewhere. No midline shift, mass effect,
evidence of mass lesion, ventriculomegaly, extra-axial collection or
acute intracranial hemorrhage. Cervicomedullary junction and
pituitary are within normal limits. Negative visualized cervical
spine. Stable gray and white matter signal, largely unremarkable for
age throughout the brain. No cortical encephalomalacia or chronic
cerebral blood products.

Visible internal auditory structures appear normal. Stable mild
mastoid effusions. New mucosal thickening or mucous retention cyst
in the left maxillary sinus. Stable mild ethmoid mucosal thickening.
Orbit and scalp soft tissues are negative. Visualized bone marrow
signal is within normal limits.
IMPRESSION: 1. Suggestion of a tiny acute lacunar infarct in the right midbrain.
Chronic bilateral distal vertebral artery stenosis is suspected.
2. No associated hemorrhage or mass effect. No other acute
intracranial abnormality.

## 2017-07-29 IMAGING — CT CT HEAD W/O CM
1 series · 16 of 30 positions shown, 20 images · non-contrast
Comparison: Brain MRI 04/11/2016.  Head CT 04/11/2016.

CLINICAL DATA: 69-year-old male with new onset of altered mental
status. Code stroke.

EXAM:
CT HEAD WITHOUT CONTRAST
TECHNIQUE: Contiguous axial images were obtained from the base of the skull
through the vertex without intravenous contrast.

[Series 2: headtrauma 4.8 h37s · axial · 0.47mm/px · z∈[+1063,+1226]mm · 16 of 36 slices shown, 20 images]
[im 2/36  brain]
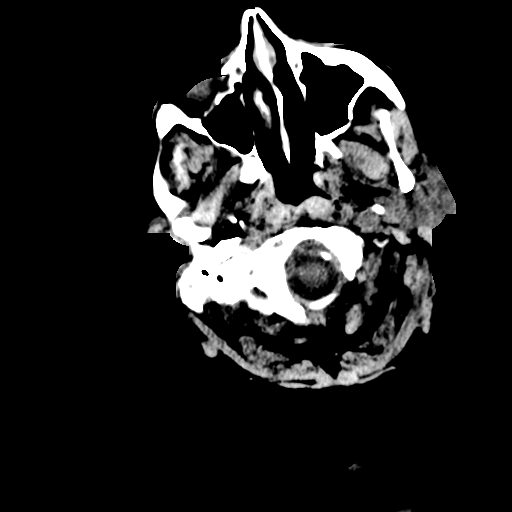
[im 2/36  bone]
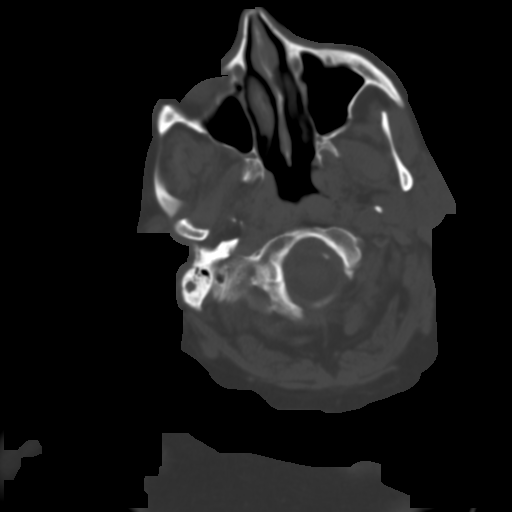
[im 4/36  brain]
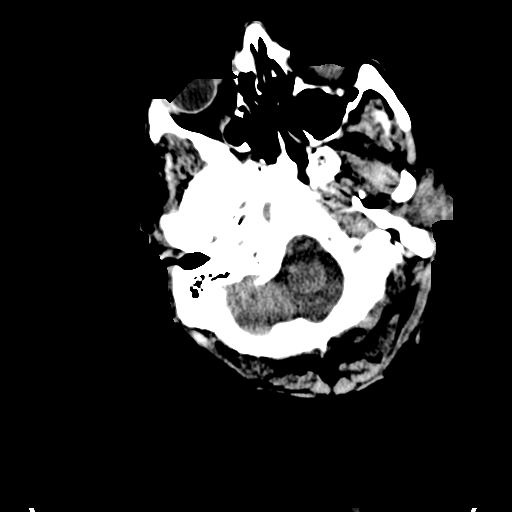
[im 7/36  brain]
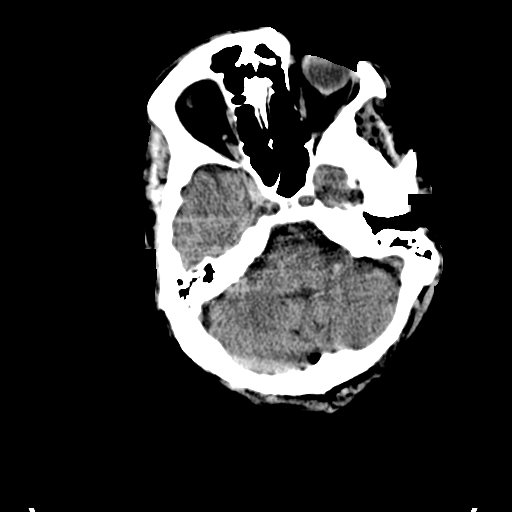
[im 9/36  brain]
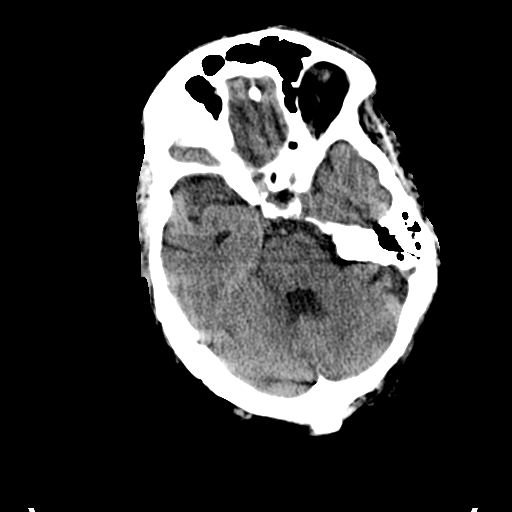
[im 10/36  brain]
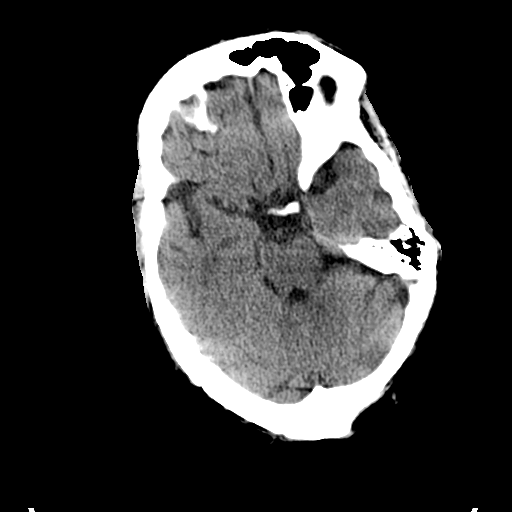
[im 10/36  bone]
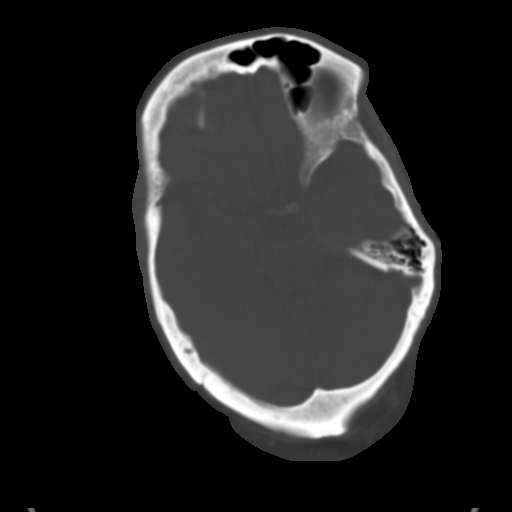
[im 13/36  brain]
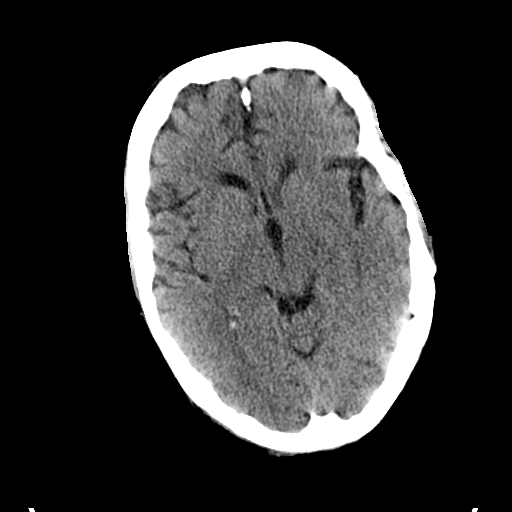
[im 15/36  brain]
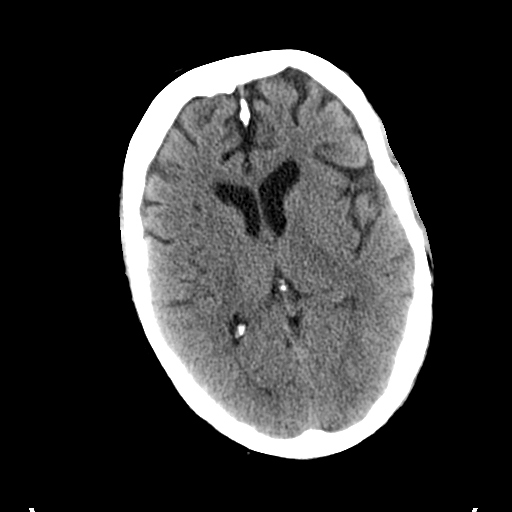
[im 17/36  brain]
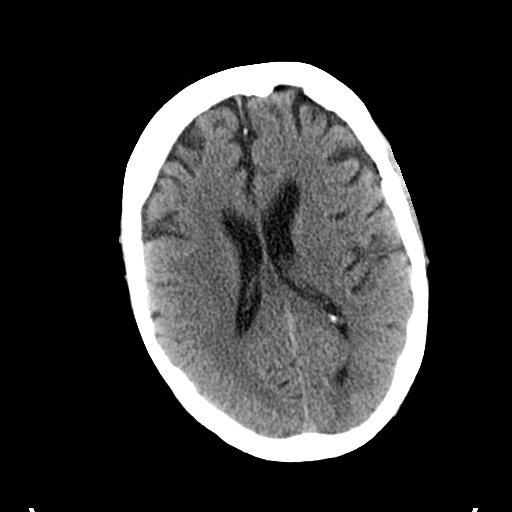
[im 19/36  brain]
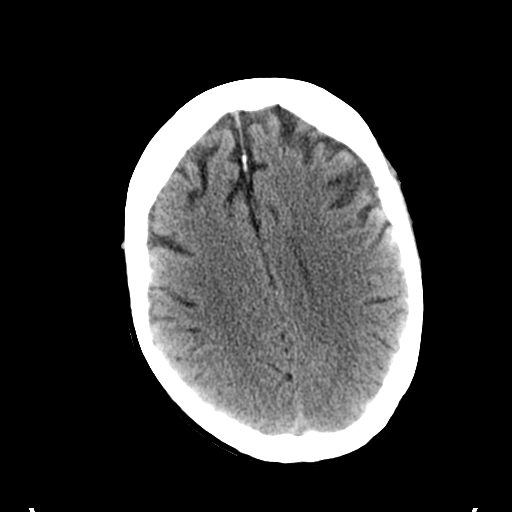
[im 19/36  bone]
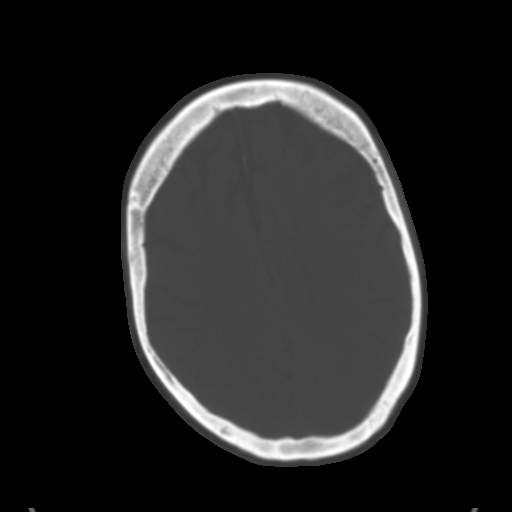
[im 21/36  brain]
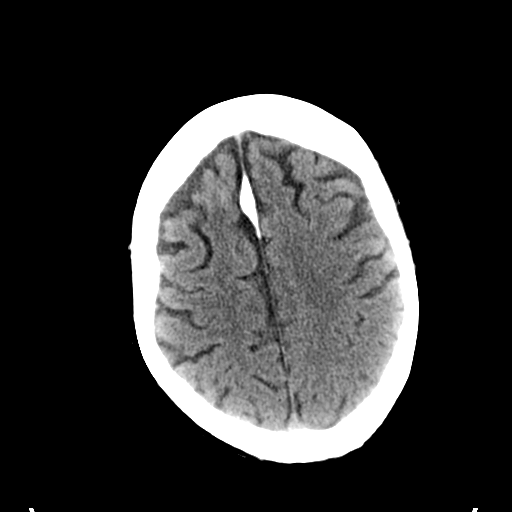
[im 23/36  brain]
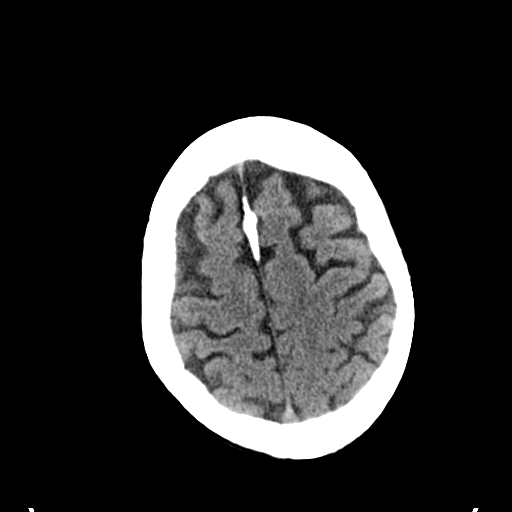
[im 26/36  brain]
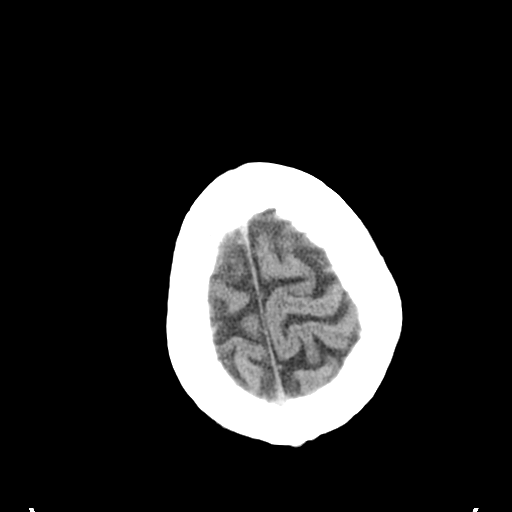
[im 27/36  brain]
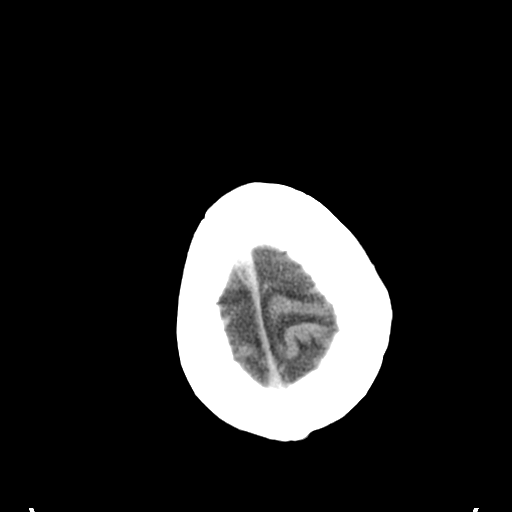
[im 27/36  bone]
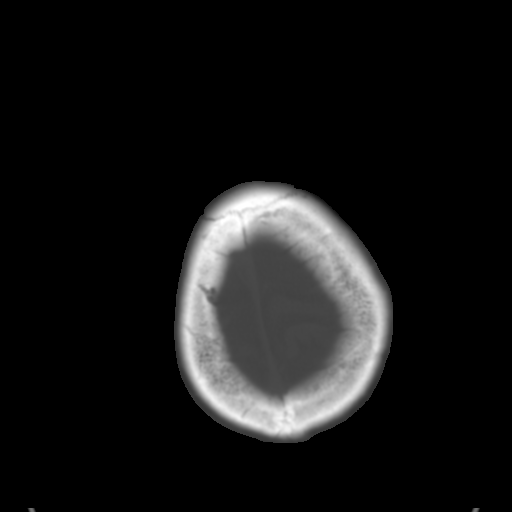
[im 29/36  brain]
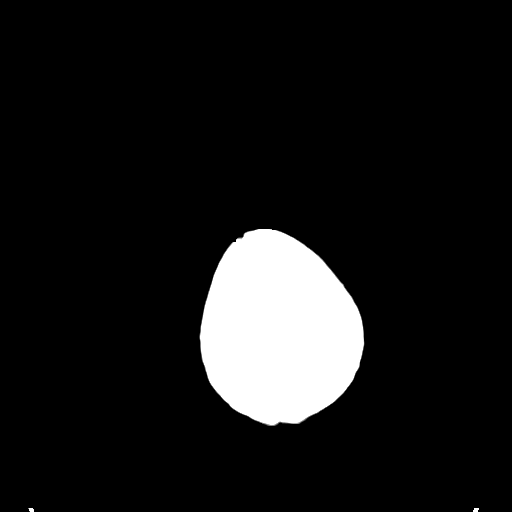
[im 32/36  brain]
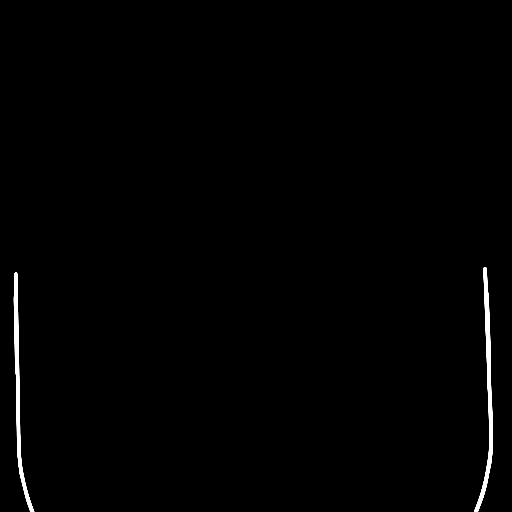
[im 34/36  brain]
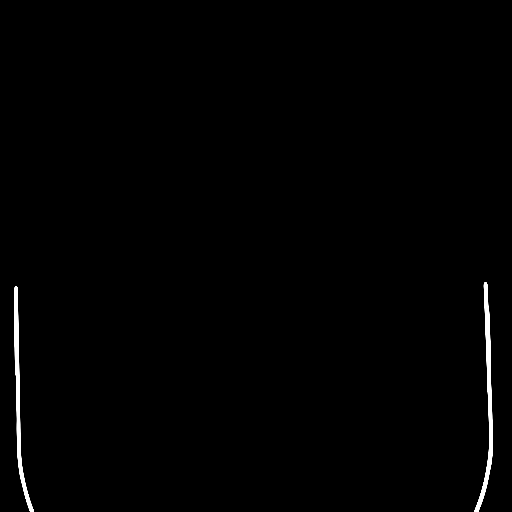

[16 of 30 positions shown; findings below may reference images not displayed]

FINDINGS: Mild cerebral atrophy. No acute intracranial abnormalities.
Specifically, no evidence of acute intracranial hemorrhage, no
definite findings of acute/subacute cerebral ischemia, no mass, mass
effect, hydrocephalus or abnormal intra or extra-axial fluid
collections. Visualized paranasal sinuses and mastoids are generally
well pneumatized. Small mucosal retention cyst or polyp in the
posterior aspect of the left maxillary sinus incompletely
visualized. No acute displaced skull fractures are identified.
IMPRESSION: 1. No acute intracranial abnormalities.
2. Mild cerebral atrophy.
These results will be called to the ordering clinician or
representative by the Radiologist Assistant, and communication
documented in the PACS or zVision Dashboard.

## 2019-05-17 ENCOUNTER — Other Ambulatory Visit: Payer: Self-pay
